# Patient Record
Sex: Female | Born: 1935
Health system: Southern US, Community
[De-identification: ages and names within clinical notes are randomized; demographics above are authoritative.]

## PROBLEM LIST (undated history)

## (undated) DIAGNOSIS — M1990 Unspecified osteoarthritis, unspecified site: Secondary | ICD-10-CM

## (undated) DIAGNOSIS — I272 Pulmonary hypertension, unspecified: Secondary | ICD-10-CM

## (undated) DIAGNOSIS — I2510 Atherosclerotic heart disease of native coronary artery without angina pectoris: Secondary | ICD-10-CM

## (undated) DIAGNOSIS — E785 Hyperlipidemia, unspecified: Secondary | ICD-10-CM

## (undated) DIAGNOSIS — Z7901 Long term (current) use of anticoagulants: Secondary | ICD-10-CM

## (undated) DIAGNOSIS — I4821 Permanent atrial fibrillation: Secondary | ICD-10-CM

## (undated) DIAGNOSIS — F32A Depression, unspecified: Secondary | ICD-10-CM

## (undated) DIAGNOSIS — I10 Essential (primary) hypertension: Secondary | ICD-10-CM

## (undated) DIAGNOSIS — C4490 Unspecified malignant neoplasm of skin, unspecified: Secondary | ICD-10-CM

## (undated) DIAGNOSIS — Z955 Presence of coronary angioplasty implant and graft: Secondary | ICD-10-CM

## (undated) DIAGNOSIS — I071 Rheumatic tricuspid insufficiency: Secondary | ICD-10-CM

## (undated) DIAGNOSIS — J449 Chronic obstructive pulmonary disease, unspecified: Secondary | ICD-10-CM

## (undated) DIAGNOSIS — F329 Major depressive disorder, single episode, unspecified: Secondary | ICD-10-CM

## (undated) DIAGNOSIS — I5032 Chronic diastolic (congestive) heart failure: Secondary | ICD-10-CM

## (undated) HISTORY — DX: Major depressive disorder, single episode, unspecified: F32.9

## (undated) HISTORY — DX: Hyperlipidemia, unspecified: E78.5

## (undated) HISTORY — DX: Depression, unspecified: F32.A

## (undated) HISTORY — PX: OTHER SURGICAL HISTORY: SHX169

## (undated) HISTORY — DX: Unspecified osteoarthritis, unspecified site: M19.90

## (undated) HISTORY — PX: HIP SURGERY: SHX245

## (undated) HISTORY — PX: ABDOMINAL HYSTERECTOMY: SHX81

---

## 1998-09-12 ENCOUNTER — Other Ambulatory Visit: Admission: RE | Admit: 1998-09-12 | Discharge: 1998-09-12 | Payer: Self-pay | Admitting: Family Medicine

## 1999-08-21 ENCOUNTER — Ambulatory Visit (HOSPITAL_BASED_OUTPATIENT_CLINIC_OR_DEPARTMENT_OTHER): Admission: RE | Admit: 1999-08-21 | Discharge: 1999-08-21 | Payer: Self-pay | Admitting: *Deleted

## 1999-08-21 ENCOUNTER — Encounter (INDEPENDENT_AMBULATORY_CARE_PROVIDER_SITE_OTHER): Payer: Self-pay | Admitting: *Deleted

## 1999-08-21 ENCOUNTER — Encounter: Payer: Self-pay | Admitting: *Deleted

## 1999-09-18 ENCOUNTER — Encounter: Payer: Self-pay | Admitting: Family Medicine

## 1999-09-18 ENCOUNTER — Encounter: Admission: RE | Admit: 1999-09-18 | Discharge: 1999-09-18 | Payer: Self-pay | Admitting: Family Medicine

## 2000-06-12 ENCOUNTER — Encounter: Payer: Self-pay | Admitting: Orthopedic Surgery

## 2000-06-19 ENCOUNTER — Inpatient Hospital Stay (HOSPITAL_COMMUNITY): Admission: RE | Admit: 2000-06-19 | Discharge: 2000-06-25 | Payer: Self-pay | Admitting: Orthopedic Surgery

## 2000-06-19 ENCOUNTER — Encounter: Payer: Self-pay | Admitting: Orthopedic Surgery

## 2005-02-21 ENCOUNTER — Ambulatory Visit: Payer: Self-pay | Admitting: Family Medicine

## 2006-02-05 ENCOUNTER — Inpatient Hospital Stay (HOSPITAL_COMMUNITY): Admission: EM | Admit: 2006-02-05 | Discharge: 2006-02-07 | Payer: Self-pay | Admitting: Emergency Medicine

## 2007-05-27 HISTORY — PX: CORONARY ANGIOPLASTY WITH STENT PLACEMENT: SHX49

## 2007-06-01 ENCOUNTER — Encounter: Admission: RE | Admit: 2007-06-01 | Discharge: 2007-06-01 | Payer: Self-pay | Admitting: Cardiology

## 2007-06-08 ENCOUNTER — Inpatient Hospital Stay (HOSPITAL_COMMUNITY): Admission: RE | Admit: 2007-06-08 | Discharge: 2007-06-10 | Payer: Self-pay | Admitting: Cardiology

## 2008-05-26 DIAGNOSIS — I2510 Atherosclerotic heart disease of native coronary artery without angina pectoris: Secondary | ICD-10-CM

## 2008-05-26 HISTORY — DX: Atherosclerotic heart disease of native coronary artery without angina pectoris: I25.10

## 2009-03-09 ENCOUNTER — Encounter: Admission: RE | Admit: 2009-03-09 | Discharge: 2009-03-09 | Payer: Self-pay | Admitting: Cardiovascular Disease

## 2009-03-14 ENCOUNTER — Ambulatory Visit (HOSPITAL_COMMUNITY): Admission: RE | Admit: 2009-03-14 | Discharge: 2009-03-14 | Payer: Self-pay | Admitting: Cardiovascular Disease

## 2009-03-22 HISTORY — PX: CARDIAC CATHETERIZATION: SHX172

## 2009-06-05 ENCOUNTER — Ambulatory Visit (HOSPITAL_COMMUNITY): Admission: RE | Admit: 2009-06-05 | Discharge: 2009-06-05 | Payer: Self-pay | Admitting: Cardiology

## 2009-06-05 HISTORY — PX: CARDIOVERSION: SHX1299

## 2010-01-23 ENCOUNTER — Encounter: Admission: RE | Admit: 2010-01-23 | Discharge: 2010-01-23 | Payer: Self-pay | Admitting: Cardiology

## 2010-08-11 LAB — BASIC METABOLIC PANEL
CO2: 26 mEq/L (ref 19–32)
GFR calc non Af Amer: >60 mL/min (ref >60)
Glucose, Bld: 97 mg/dL (ref 70–99)

## 2010-08-11 LAB — CBC
HCT: 36.3 % (ref 36.0–46.0)
RDW: 13.4 % (ref 11.5–15.5)
WBC: 8.3 10*3/uL (ref 4.0–10.5)

## 2010-08-11 LAB — APTT: aPTT: 55 seconds — ABNORMAL HIGH (ref 24–37)

## 2010-08-11 LAB — TSH: TSH: 0.796 u[IU]/mL (ref 0.350–4.500)

## 2010-10-08 NOTE — Cardiovascular Report (Signed)
NAMEJADIS, Tiffany Charles NO.:  [id number]   MEDICAL RECORD NO.:  [id number]          PATIENT TYPE:  OIB   LOCATION:  2857                         FACILITY:  [facility]   PHYSICIAN:  Cristy Hilts. Jacinto Halim, MD       DATE OF BIRTH:  02-21-1930   DATE OF PROCEDURE:  06/08/2007  DATE OF DISCHARGE:                            CARDIAC CATHETERIZATION   REFERRING PHYSICIAN:  Aida Puffer, MD.   PROCEDURES PERFORMED.:  1. Left ventriculography.  2. Selective left coronary angiography.  3. Percutaneous transluminal cardiac angioplasty and stenting of the      distal right coronary artery.  4. Right femoral arteriography and closure of right femoral arterial      access with StarClose.   INDICATIONS:  Tiffany Charles is a 77 year old active female with history  of hypertension, hyperlipidemia who has known coronary artery disease.  She had undergone diagnostic cardiac catheterization at [facility] for angina pectoris on March 12, 2007, and this had revealed 8-  80% stenosis of the distal right coronary artery, diffuse disease of the  right proximal and mid right coronary artery, and a distal circumflex  90% stenosis.  She had collaterals from the right coronary artery to the  distal circumflex coronary artery.  She had moderate disease in the LAD.  She was aggressively medically treated. In spite of aggressive medical  therapy, she continued to have recurrent angina pectoris.  Hence, she is  brought back to the cardiac catheterization lab to reevaluate her  coronary anatomy with an eye towards percutaneous revascularization.   HEMODYNAMIC DATA.:  Left ventricle:  Left ventricle pressure was 154/4  with end-diastolic pressure of 16 mmHg.  Aortic pressure 152/16 with a  mean of 99 mmHg.  No pressure gradient across the aortic valve.   ANGIOGRAPHIC DATA:  Left ventricle:  The left ventricular systolic  function was normal with ejection fraction of 60%.  There was no  significant wall motion abnormality.  There was no significant mitral  regurgitation.   Right coronary artery: The right coronary artery is a large vessel and  dominant vessel.  It has mild to moderate diffuse luminal irregularities  in the proximal and mid segment constituting 40-50% stenosis.  Distal  right coronary artery had an 80% stenosis.  It gave origin to two PDA  branches.  The proximal PDA branch has an ostial 70% stenosis unchanged  from prior cardiac catheterization.  PLA branch is large.  This is  smooth and normal.   Left main coronary artery:  The left main coronary is a large vessel.  It has a proximal 10-15% stenosis which is calcified.   Circumflex artery:  The circumflex artery is a moderate-caliber vessel.  Gives origin to a large obtuse marginal-1.  The distal circumflex after  the origin of the obtuse marginal-1 had a focal 90% stenosis.  However,  this vessel was approximately about 2.0 mm in diameter.  This distal  circumflex received collaterals from the right coronary artery.   LAD:  The LAD is a large vessel.  It gives origin to several small  diagonals.  It has a 40% mid stenosis.  Otherwise, it has mild diffuse  luminal irregularity.   INTERVENTION DATA:  Successful PTCA and direct stenting of the distal  right coronary artery with implantation of a 3.5 x 23 mm Vision stent.  This stent was deployed at 16 atmospheres pressure giving a 3.9 mm  diameter.  Post stent implantation angiography revealed excellent  results.   RECOMMENDATIONS:  The patient will be continued on aggressive risk  modification.  Medical therapy is only indicated for distal circumflex  stenosis as it has collaterals from the right coronary artery.  Overall,  the stenosis has been reduced from 80% to 0%.   The patient will be discharged home in the morning.  A total of 135-145  mL of contrast was utilized for diagnostic and interventional procedure.   TECHNIQUE OF PROCEDURE:   Under usual sterile precautions using a 6-  French right femoral arterial access, a 6-French multipurpose B2  catheter was advanced into the ascending aorta and then the left  ventricle.  Left ventriculography was performed both in LAO and RAO  projections.  Then the catheter was pulled into the ascending aorta.  Right coronary artery selectively engaged.  Angiography was performed.  Then the left main coronary artery was selectively engaged, and  angiography was performed.  Then the catheter was pulled out of body.   A 6-French sheath was exchanged to a 7-French sheath.  A 7-French FR-4  guide with side-hole was advanced into the ascending aorta, and right  coronary artery was selectively engaged.  Using Angiomax for  anticoagulation, 190 cm x 0.014 inch  ATW marker guidewire was advanced  in the right coronary artery, and the lesion length was carefully  measured.  Intracoronary nitroglycerin was also administered.  Then a  3.5 x 23 mm Vision stent was advanced into the lesion site and the stent  deployed at 16 atmospheres pressure for 1 minute, and angiography was  repeated.  Intracoronary nitroglycerin was also administered.  Excellent  results were noted.  Guidewire was withdrawn. Angiography repeated.  The  guide catheter disengaged from right coronary artery and pulled out of  the body.  The patient tolerated the procedure well.  Right femoral  angiography was performed through the arterial access sheath, and access  closed with StarClose with excellent hemostasis.  No immediate  complication noted.      Cristy Hilts. Jacinto Halim, MD  Electronically Signed     JRG/MEDQ  D:  06/08/2007  T:  06/08/2007  Job:  [id number]   cc:   Aida Puffer

## 2010-10-08 NOTE — Discharge Summary (Signed)
Tiffany Charles, Tiffany Charles NO.:  [id number]   MEDICAL RECORD NO.:  [id number]          PATIENT TYPE:  INP   LOCATION:  6527                         FACILITY:  [facility]   PHYSICIAN:  Cristy Hilts. Jacinto Halim, MD       DATE OF BIRTH:  1930-03-17   DATE OF ADMISSION:  06/08/2007  DATE OF DISCHARGE:                               DISCHARGE SUMMARY   DISCHARGE DIAGNOSES:  1. Dyspnea on exertion and angina resolved.  2. Coronary artery disease, previously nonobstructive disease.      a.     Distal right coronary artery with 80% stenosis, underwent a       Multilink Vision stent by Dr. Jacinto Halim.  3. Normal ejection fraction at 60%.  4. Sinus bradycardia with heart rates in the 30s.      a.     Decrease in beta blocker dose.  5. Hypertension, stable and controlled.  6. Hyperlipidemia, controlled.  7. The patient has stopped tobacco one month ago.  8. Pulmonary hypertension.   DISCHARGE MEDICATIONS:  1. Amlodipine 10 mg daily.  2. Protonix 40 mg daily.  3. Lisinopril 10 mg daily.  4. Aspirin 325 mg daily.  5. Pravastatin 40 mg at  bedtime.  6. Metoprolol tartrate 25-mg tablet, 1/2 tablet twice daily or 12.5 mg      twice per day.  7. Plavix 75 mg daily. Do not stop.  8. Nitroglycerin as needed for chest pain, sublingual.   HISTORY OF THE PRESENT ILLNESS:  A 77 year old female patient of Dr.  Verl Dicker who presented to the office with dyspnea on exertion, chest  pressure.  She had a history of nonobstructive disease with a cath in  October of 2008 with 70% stenosis in the mid to distal RCA and 40% mid  LAD stenosis, but she was increasing anginal symptoms, so Dr. Jacinto Halim felt  prudent treatment would be a cardiac catheterization which she agreed  to.  She was brought in on 06/08/2007 and underwent cardiac  catheterization.  She was found to have an 80% distal RCA stenosis. She  does have collaterals from the RCA to the circumflex and does have a  stenosis in the circ that the RCA feeds  which would explain her angina.   She underwent MultiVision non-DES stent to the RCA and tolerated the  procedure well.  She was StarClosed.  The next morning her troponins  were elevated at 0.20 and then 0.29.  CK-MBs were all negative.  Also,  her heart rate was found to be in the 30s frequently and in the 40s.  She had complained of fatigue so we held her Lopressor the morning of  06/08/2007 and restarted her at 12.5 mg twice per day.  She was able to  ambulate with cardiac rehabilitation without problems, but we did keep  her one extra day secondary to elevated troponins and bradycardia.  By  06/10/2007 she was stable, labs were good and she was ready for  discharge home.   PHYSICAL EXAMINATION ON DISCHARGE:  VITAL SIGNS:  Blood pressure was  136/55,  pulse rate was  58 to 48, respirations were 18, saturations were  96% in room air, temperature was 97.7.  HEART:  S1, S2 regular rate and rhythm.  No no murmurs, gallops or rubs.  LUNGS:  Clear.  ABDOMEN:  Soft and nontender.  Positive bowel sounds.  EXTREMITIES:  Without edema.   LABORATORY DATA:  Hemoglobin was 12.1, hematocrit was 36.4, WBC was 9.2,  platelets were 165, MCV 96.7.  Chemistry:  Sodium 138, potassium was  3.9, chloride 104, CO2 was 27, BUN 13, creatinine 0.93, glucose was 104.  Troponin post procedure was 0.20 and then 0.29, then 0.22 and then down  to 0.17.  CK-MBs were all negative. Was 58, 67, MB is 2.1-4.2.  Electrolytes: Other calcium is 8.6.   Chest x-ray had been done as an outpatient and was reviewed.   EKG post procedure: Sinus bradycardia, heart rate was 50, otherwise no  changes, occasional PAC.   The hospital course was previously described.  She was seen by Dr.  Elsie Lincoln on 06/10/2007 and was felt ready for discharge.   She will follow up as an outpatient with Dr. Jacinto Halim.      Darcella Gasman. [place], N.P.      Cristy Hilts. Jacinto Halim, MD  Electronically Signed    LRI/MEDQ  D:  06/10/2007  T:  06/10/2007   Job:  [id number]   cc:   Cristy Hilts. Jacinto Halim, MD  Aida Puffer

## 2010-10-11 NOTE — Op Note (Signed)
[place]  Patient:    Tiffany Charles, Tiffany Charles                       MRN: [id number] Proc. Date: 06/19/00 Adm. Date:  [id number] Attending:  Skip Mayer                           Operative Report  PREOPERATIVE DIAGNOSIS:  Severe degenerative arthritis of the right hip with a 1/4 inch leg length discrepancy with the right leg being 1/4 inch shorter than the left.  POSTOPERATIVE DIAGNOSIS:  Severe degenerative arthritis of the right hip with a 1/4 inch leg length discrepancy with the right leg being 1/4 inch shorter than the left.  OPERATION:  Right total hip arthroplasty utilizing the [employer] Secure Fit System porous coated.  The sizes used was a size 50 mm microstructured PSL cup with two screws.  The insert was a 10 degree polyethylene liner.  We utilized a +0  C tapered head and the femoral stem was a Secure Fit Plus size 9.  SURGEON:  Georges Lynch. Gioffre, M.D.  ASSISTANT:  Philips J. Montez Morita, M.D.  DESCRIPTION OF PROCEDURE:  Under general anesthesia a routine orthopedic prep and draping of the right hip was carried out.  At this time, the patient had 1 gram of IV Ancef.  Incision was made over the posterior lateral aspect of the right hip in the usual fashion. Bleeders were identified and cauterized. Self retaining retractors were inserted.  The incision was carried down to the iliotibial band.  The Charnley retractor was inserted.  I then detached the external rotators and partly detached the gluteus medius.  At this particular time, we then went down and totally detached the external rotators and did a capsulectomy.  Great care was taken not to injure the underlying sciatic nerve.  We then dislocated the femoral head, amputated the head at the appropriate neck length.  We then reamed and rasped the femoral shaft up to a size 9.  I brought the C arm in to make sure that we did not penetrate the femoral canal and we did not.  At this  time, we reamed our distal tip to a 13.5 mm size.  After the femur was prepared, we then utilized the Gold rasp in order to further rasp the femoral shaft.   After this, we then completed our capsulectomy.  We reamed our cup up to a size 50 mm cup.  We inserted our trial cup in and went through a range of motion with all components in place and we felt that a +0 was our most stable length of the C tapered head.  We then removed the trials and thoroughly irrigated out the cup and inserted our permanent PSL cup size 50 mm in diameter.  Then we went through trials again before the screws were inserted.  We had an excellent position of the cup.  We then inserted two cancellus bone screws to affix the cup to the acetabulum. We then inserted our 10 degree polyethylene liner.  We then went through trials once again with the +0 and the -3 C tapered head and felt that the +0 was the most stable.  We then irrigated out the femoral shaft and inserted our permanent size 9 Secure Fit femoral prosthesis.  We then went through and inserted our permanent +0 C tapered head.  We reduced the hip and  had excellent function and the hip was definitely stable.  Thoroughly irrigated out the area and closed the wound in layered usual fashion.  Skin was closed with metal staples.  A sterile Neosporin dressing was applied.  The patient was placed in the knee immobilizer postoperative. DD:  06/19/00 TD:  06/20/00 Job: [id number]

## 2010-10-11 NOTE — Discharge Summary (Signed)
NAMEDAVITA, SUBLETT NO.:  [id number]   MEDICAL RECORD NO.:  [id number]          PATIENT TYPE:  INP   LOCATION:  1514                         FACILITY:  [facility]   PHYSICIAN:  Almedia Balls. Ranell Patrick, M.D. DATE OF BIRTH:  04/22/1930   DATE OF ADMISSION:  02/05/2006  DATE OF DISCHARGE:  02/07/2006                                 DISCHARGE SUMMARY   ADMISSION DIAGNOSES:  1. Right hip pain.  2. History of right total hip arthroplasty.  3. History of gastroesophageal reflux disease.   DISCHARGE DIAGNOSES:  1. Right hip pain, improved.  2. History of right total hip arthroplasty.  3. History of gastroesophageal reflux disease.   BRIEF HISTORY:  The patient is a 77 year old female who woke up with right  hip and leg pain.  It was a severe pain radiating down the right side of her  leg.  The patient states she has been having some increasing pain with  movement and was unable to walk or ambulate.  She was brought in by  ambulance to the emergency department and admitted for pain control and the  questionable etiology of this right hip pain.   HOSPITAL COURSE:  The patient was admitted on February 05, 2006 from the  emergency department.  On initial exam, the patient was able to move her  right hip without very much difficulty, stating that most of her pain seemed  to be also in her back radiating down to the right hip and right groin, but  she did not have pain with any type of internal or external rotation.  She  did have a mildly elevated white blood count of 15.6 on initial CBC, but she  was afebrile.  We did discuss this case with Dr. Darrelyn Hillock, who was the  primary surgeon for her right total hip arthroplasty, and before trying any  type of aspiration, we did get an MRI of her low back, which was negative.  On hospital day 2, the patient was feeling much better in regard to her  right hip pain.  It had basically decreased a tremendous amount.  Thus, we  were not  inclined to go through with the hip aspiration.  Her sed rate and C-  reactive protein both were not elevated, which helped confirm that an  aspiration would not be appropriate at this time.  We just continued  watching her progress.  She was able to work with physical therapy and  occupational therapy for walking with a walker and was going to be  discharged home with the same.  The patient was discharged home on hospital  day 3 after her pain had decreased tremendously and she was able to use a  rolling walker without very much difficulty.  The patient was discharged  home in stable condition on February 07, 2006.   CONDITION ON DISCHARGE:  Stable.   DIET:  Regular.   ALLERGIES:  No known drug allergies.   DISCHARGE MEDICATIONS:  1. Vicodin 5/500, 1-2 tablets q.4 h. for pain.  2. Robaxin 500 mg p.o. q.6 h. p.r.n. spasm.  FOLLOWUP:  The patient is to follow up with Dr. Darrelyn Hillock in 2 weeks for  further management.      Thomas B. Dixon, P.A.    ______________________________  Almedia Balls. Ranell Patrick, M.D.    TBD/MEDQ  D:  02/27/2006  T:  02/28/2006  Job:  [id number]

## 2010-10-11 NOTE — H&P (Signed)
[facility]  Patient:    Tiffany Charles, Tiffany Charles                      MRN: [id number] Adm. Date:  06/19/00 Attending:  Georges Lynch. Darrelyn Hillock, M.D. Dictator:   Alexzandrew L. Perkins, P.A.-C.                         History and Physical  CHIEF COMPLAINT:  Right hip pain.  HISTORY OF PRESENT ILLNESS:  The patient is a 77 year old female who has been seen and evaluated for right hip pain.  She has been seen and evaluated by Georges Lynch. Darrelyn Hillock, M.D.  She comes in with a history of right hip pain after periods of prolonged standing or sitting.  The pain is mostly in the groin. It is somewhat positional.  She is seen in the office and has very limited range of motion in the right hip and also painful passive range of motion. She has about a 1/4 inch length discrepancy shortening on the right leg as compared to the left.  X-rays taken in the office show significant degenerative arthritis in the right hip.  She is already at a stage where she would benefit from undergoing a total hip replacement arthroplasty.  The risks and benefits of this procedure have been discussed with the patient and she has elected to proceed with surgery.  ALLERGIES:  No known drug allergies.  INTOLERANCE:  CODEINE causes GI upset.  MEDICATIONS:  Vioxx 25 mg daily.  Stopped prior to surgery.  PAST MEDICAL HISTORY:  Mild hypercholesterolemia, reflux, and history of ankle fracture.  PAST SURGICAL HISTORY:  Breast biopsy in March of 2001 and also a partial hysterectomy.  MEDICAL DOCTOR:  Arta Silence, M.D.  SOCIAL HISTORY:  The patient is married and has two children.  A positive smoker for approximately 45 years.  She is down to a few cigarettes a day. Denies the use of alcohol products.  She lives in a [place] home with only two steps going in.  FAMILY HISTORY:  Mother deceased at age 7 with a history of stroke.  Father decreased at age 59 with coronary artery disease and  questionable heart disease.  REVIEW OF SYSTEMS:  General:  No fever, chills, or night sweats.  Neurologic: No seizures, syncope, or paralysis.  Respiratory:  No shortness of breath, productive cough, or hemoptysis.  Cardiovascular:  No chest pain, angina, or orthopnea.  GI:  The patient has some occasional loose stools that she attributes to her Vioxx.  No diarrhea or constipation.  No nausea or vomiting. No blood or mucus in the stool.  GU:  No dysuria, hematuria, or discharge. Musculoskeletal:  Pertinent for the right hip as found in the history of present illness.  PHYSICAL EXAMINATION:  VITAL SIGNS:  Pulse 64, respirations 12, blood pressure 118/66.  GENERAL APPEARANCE:  The patient is a 77 year old white female, well nourished, well developed, and appears to be in no acute distress.  She is alert, oriented, and cooperative.  HEENT:  Normocephalic and atraumatic.  Pupils round and reactive EOMs are intact.  The patient does have upper dentures.  NECK:  Supple.  No carotid bruits.  CHEST:  Clear to auscultation and percussion.  No rhonchi or rales.  HEART:  Split S1.  Normal S2.  No rubs, thrills, or palpitations.  No murmurs.  ABDOMEN:  Soft and nontender.  Bowel sounds are present.  No rebound  or guarding.  RECTAL:  Not done, not pertinent to present illness.  BREASTS:  Not done, not pertinent to present illness.  GENITALIA:  Not done; not pertinent to present illness.  EXTREMITIES:  Significant to the right lower extremity.  The patient has decreased range of motion, passive range of motion of the right hip.  It is painful motion.  Motor function is intact.  Movement of the leg, foot, and toes well.  Sensation is intact throughout the right lower extremity.  There is a 1/4 inch leg length discrepancy on the right as compared to the left.  X-RAYS:  Severe degenerative arthritis, right hip.  IMPRESSION: 1. Osteoarthritis, right hip. 2. Gastroesophageal reflux  disease. 3. Mild hypercholesterolemia.  PLAN:  The patient will be admitted to [facility] to undergo a right total hip replacement arthroplasty.  The surgery is to be performed by Windy Fast A. Darrelyn Hillock, M.D.  The patients medical doctor is Arta Silence, M.D. Dr. Hetty Ely will be notified of the room number on admission and will be consulted as needed for any assistance with this patient throughout the hospital course. DD:  06/15/00 TD:  06/15/00 Job: [id number]

## 2010-10-11 NOTE — Discharge Summary (Signed)
[facility]  Patient:    Tiffany Charles, Tiffany Charles                       MRN: [id number] Adm. Date:  [id number] Disc. Date: [id number] Attending:  Skip Mayer Dictator:   Dorie Rank, P.A.                           Discharge Summary  ADMITTING DIAGNOSES:  1. Osteoarthritis to the right hip.  2. History of ankle fracture.  3. Gastroesophageal reflux disease.  DISCHARGE DIAGNOSES:  1. Osteoarthritis to the right hip.  2. History of ankle fracture.  3. Gastroesophageal reflux disease.  4. Postoperative hemorrhagic anemia--stable.  OPERATION PERFORMED:  On June 19, 2000, the patient underwent a right total hip arthroplasty. Anesthesia general. Surgeon Dr. Ranee Gosselin, assistant, Ronnell Guadalajara, M.D.  COMPLICATIONS:  None.  CONSULTATIONS:  None.  BRIEF HISTORY:  A 77 year old white female, who has a history of right hip pain after periods of prolonged sitting or standing. The pain was mostly from the groin area somewhat. At the office, it was noted that she had very limited range of motion to the right hip and also painful passive range of motion. She had about a 1/4 inch length discrepancy shortening on the right leg as compared to the left. X-rays taken in the office showed significant degenerative arthritis in the right hip. It was felt then due to her symptoms x-rays and her decrease in her activities of daily living, she would benefit from undergoing a right total knee arthroplasty. The risks and benefits were discussed with the patient and at that point in the office, she elected to proceed with the procedure. She was cleared for surgery by her family nurse practitioner, Arizona Constable.  HOSPITAL COURSE:  On June 19, 2000, the patient underwent the above stated procedure without complications. Postoperatively for pain, she utilized a PCA and morphine pump for pain control. By postoperative day #2, she was weaned off of PCA and was  utilizing p.o. Percocet for pain on the date of discharge. While in surgery, the wound was dressed in a sterile fashion. On postoperative day #1, the wound was monitored for drainage through the bandage and it was noted that there was none. On postoperative day #2, the dressing was changed and found to be free of erythema or any exudate. The wound was changed daily throughout her hospital stay and monitored for erythema or drainage. On postoperative day #1, the Hemovac drain that was instilled while in the OR was pulled without difficulty. A Foley catheter was placed while in the operating room. By postoperative day #2, this was pulled and the patient voided well throughout the rest of her hospital stay. Postoperatively, the patient was placed on Coumadin that was monitored by the pharmacy for DVT prophylaxis. The pharmacy monitored and dosed her Coumadin throughout her hospital stay. Hemoglobin and hematocrit were monitored daily x 3 days. Her hemoglobin did go down to 8.8 on June 21, 2000. At this point, she was exhibiting some symptoms of orthostatic hypertension while standing up. She was transfused on June 21, 2000 and on June 22, 2000 her hemoglobin was back up to 11.5 post transfusion. The patient postoperatively utilized an IV of lactated Ringers with 100 cc per hour and this was KVOd on postoperative day #2 when she was found to be taking p.o. fluids well. One gram of Ancef IV was utilized for  three days, q. 8h for infection prophylaxis. The patient wore a knee immobilizer throughout her hospital day for prevention of possible dislocation. The patient underwent physical therapy while in the hospital per right total hip protocol. She progressed well with her therapy. She was slowed down on January 27 due to the fact that she was orthostatic and was receiving blood transfusions. However, after that she progressed well and by the day of discharge she was ambulating 120 feet.  Occupational therapy was consulted to assist with activities of daily living. The patient was placed on a touchdown weightbearing status to the right lower extremity, she did maintain this throughout her hospital stay. On June 25, 2000, the patient was stable and progressing well and it was felt that she could be discharged home. Case management worked with the patient to set up home health care which would provide her with physical therapy and monitor her Coumadin and INRs as well.  LABORATORY DATA:  Hemoglobin and hematocrit on June 12, 2000 was 13.0 and 39.0 respectively. On June 21, 2000, hemoglobin was 8.9, hematocrit 26.1. On June 22, 2000, hemoglobin post transfusion was 11.9, hematocrit was 33.1. PT and INR on June 12, 2000 was 12.0 and 0.9. PT and INR on January ______, 2002 were 18.6 and 1.9. On the day of discharge, June 25, 2000, PT was 22.1, INR was 2.6. BMET was normal on June 12, 2000. Liver functions on June 12, 2000 revealed ALT 18, ALP 63, TBIL 0.5. Urinalysis on June 12, 2000 was negative.  X-rays on June 19, 2000 one view pelvis and one view of the right hip revealed satisfactory position of the right total hip replacement. Preop chest x-ray on June 12, 2000 showed the cardia mediastinal silhouette to be unremarkable for her age. Mild elevation of the left hemidiaphragm present without obvious intrathoracic cause. The lungs were clear. Mild degenerative changes present to the lower thoracic spine. Right hip with MAG marker showed severe osteoarthritis of the right hip. EKG on June 05, 2000 showed sinus bradycardia that was faxed there from [place] at [facility] and was unconfirmed.  CONDITION ON DISCHARGE:  Improved.  DISCHARGE PLANS:  1. The patient was discharged home, she was to have home health PT,     home health RN, and the pharmacy through [facility] was to monitor her     PT and INR and dose her Coumadin.  2. Regular  diet.  3. She was given prescriptions for Percocet and Robaxin. She was to     follow-up with Dr. Darrelyn Hillock in 14 days from the date of her surgery.  4. Dressing changes as needed.   5. She was placed on touchdown weightbearing status to the right lower     extremity.  6. She was to take Tylenol for fever. She was to call our office if it was     over 101.  7. She was to continue her home medications.  8. She could shower as long as her wound was not draining. DD:  07/17/00 TD:  07/20/00 Job: [id number]

## 2010-10-11 NOTE — Op Note (Signed)
[facility]  Patient:    Tiffany Charles, Tiffany Charles                       MRN: [id number] Proc. Date: 08/21/99 Adm. Date:  [id number] Attending:  Kandis Mannan CC:         Dr. Mikey College. Samuella Cota, M.D.                           Operative Report  CCS# [id number]  PREOPERATIVE DIAGNOSIS:  Abnormal mammogram left breast.  POSTOPERATIVE DIAGNOSIS:  Abnormal mammogram left breast.  OPERATION PERFORMED:  Needle localization biopsy of left breast.  SURGEON:  Maisie Fus B. Samuella Cota, M.D.  ANESTHESIA:  1% Xylocaine local with anesthesia monitoring, anesthesiologist and CRNA Meta Hatchet.  DESCRIPTION OF PROCEDURE:  The patient was taken to the operating room and placed on the table in supine position.  The right breast was prepped and draped in a sterile field.  The patient had a wire coming through the skin high in the left  breast at about the 1 oclock position.  A transverse elliptical incision was outlined with a skin marker.  1% Xylocaine local was then used to infiltrate the skin and underlying breast tissue.  An ellipse of skin was excised so as to remove the opening in the skin made by the wire.  Using the wire as a guide, the breast tissue was taken down to the chest wall.  Bleeding was controlled with the cautery. The specimen was marked laterally with a long suture and medially with a short suture.  The specimen was given to the radiologist who x-rayed the specimen and  said the area of concern had been removed.  Hemostasis was obtained and the subcuticular tissue was approximated with 3-0 Vicryl and the skin was closed with a running subcuticular suture of 5-0 Vicryl.  Benzoin and half-inch Steri-Strips ere used to reinforce the skin closure.  Pressure dressing using 4 x 4s, ABD, and 4  inch Hypafix was applied.  The patient seemed to tolerate the procedure well and was taken to the PACU in satisfactory condition. DD:   08/21/99 TD:  08/22/99 Job: [id number]

## 2011-02-13 LAB — BASIC METABOLIC PANEL
CO2: 28
Creatinine, Ser: 0.86
GFR calc Af Amer: >60
GFR calc non Af Amer: >60
Glucose, Bld: 82
Potassium: 4.1

## 2011-02-13 LAB — CBC
Hemoglobin: 12.1
MCHC: 34
MCV: 97
WBC: 8.6

## 2011-02-13 LAB — TROPONIN I
Troponin I: 0.2 — ABNORMAL HIGH
Troponin I: 0.29 — ABNORMAL HIGH

## 2011-02-13 LAB — CK TOTAL AND CKMB (NOT AT ARMC)
CK, MB: 4.2 — ABNORMAL HIGH
Relative Index: INVALID
Total CK: 67

## 2011-02-14 LAB — BASIC METABOLIC PANEL
CO2: 27
Creatinine, Ser: 0.93
GFR calc Af Amer: >60
Potassium: 3.9

## 2011-02-14 LAB — CBC
HCT: 36.4
Hemoglobin: 12.6
RBC: 3.77 — ABNORMAL LOW

## 2011-02-14 LAB — CK TOTAL AND CKMB (NOT AT ARMC)
CK, MB: 2.1
Total CK: 58

## 2011-03-20 ENCOUNTER — Emergency Department (HOSPITAL_COMMUNITY): Payer: No Typology Code available for payment source

## 2011-03-20 ENCOUNTER — Emergency Department (HOSPITAL_COMMUNITY)
Admission: EM | Admit: 2011-03-20 | Discharge: 2011-03-20 | Disposition: A | Discharge status: Home / Self Care | Payer: No Typology Code available for payment source | Attending: Emergency Medicine | Admitting: Emergency Medicine

## 2011-03-20 DIAGNOSIS — M79609 Pain in unspecified limb: Secondary | ICD-10-CM | POA: Insufficient documentation

## 2011-03-20 DIAGNOSIS — M7989 Other specified soft tissue disorders: Secondary | ICD-10-CM | POA: Insufficient documentation

## 2011-03-20 DIAGNOSIS — S60229A Contusion of unspecified hand, initial encounter: Secondary | ICD-10-CM | POA: Insufficient documentation

## 2011-03-20 DIAGNOSIS — Z7901 Long term (current) use of anticoagulants: Secondary | ICD-10-CM | POA: Insufficient documentation

## 2011-03-20 LAB — PROTIME-INR: INR: 3.16 — ABNORMAL HIGH (ref 0.00–1.49)

## 2011-05-23 ENCOUNTER — Encounter (INDEPENDENT_AMBULATORY_CARE_PROVIDER_SITE_OTHER): Payer: Self-pay | Admitting: General Surgery

## 2011-05-27 DIAGNOSIS — C4490 Unspecified malignant neoplasm of skin, unspecified: Secondary | ICD-10-CM

## 2011-05-27 HISTORY — DX: Unspecified malignant neoplasm of skin, unspecified: C44.90

## 2011-05-29 ENCOUNTER — Ambulatory Visit (INDEPENDENT_AMBULATORY_CARE_PROVIDER_SITE_OTHER): Payer: PRIVATE HEALTH INSURANCE | Admitting: General Surgery

## 2011-05-29 ENCOUNTER — Encounter (INDEPENDENT_AMBULATORY_CARE_PROVIDER_SITE_OTHER): Payer: Self-pay

## 2011-05-29 ENCOUNTER — Encounter (INDEPENDENT_AMBULATORY_CARE_PROVIDER_SITE_OTHER): Payer: Self-pay | Admitting: General Surgery

## 2011-05-29 VITALS — BP 120/78 | HR 68 | Temp 98.2°F | Resp 18 | Ht 67.5 in | Wt 140.0 lb

## 2011-05-29 DIAGNOSIS — C44529 Squamous cell carcinoma of skin of other part of trunk: Secondary | ICD-10-CM

## 2011-05-29 NOTE — Patient Instructions (Signed)
You will be scheduled for a wider excision of the squamous cell cancer of the skin overlying your left clavicle. We will ask Dr. Caprice Kluver if we can stop your Coumadin 5 days prior to this surgery.

## 2011-05-29 NOTE — Progress Notes (Signed)
Patient ID: Tiffany Charles, female   DOB: July 15, 1929, 78 y.o.   MRN: [id number]  Chief Complaint  Patient presents with  . Other    squamous cell ca chest wall    HPI Tiffany Charles is a 78 y.o. female.  This patient is referred to me by Dr. Aida Puffer at [facility] for wide local excision of a squamous cell cancer of the skin overlying the left clavicle. Dr. Caprice Kluver is her cardiologist.  The patient is very pleasant patient, she is independent. She is followed for pulmonary hypertension, three-vessel coronary artery disease status post stent placement, atrial fibrillation, hyperlipidemia.  The patient states that she noticed a red bump overlying her left clavicle recently. Over the space of about 3 weeks enlarged. Dr. Aida Puffer excise this in the office recently. The sutures are still in place. The pathology report showed well differentiated squamous cell carcinoma. This involves the underlying dermis. Margins are involved. She has had no problems healing from this. She has no prior history of skin cancer. HPI  Past Medical History  Diagnosis Date  . Campath-induced atrial fibrillation   . Chronic ischemic heart disease, unspecified   . Angina pectoris   . Hyperlipidemia   . Myocardial infarction   . Arthritis     Past Surgical History  Procedure Date  . Abdominal hysterectomy   . Hip surgery     Family History  Problem Relation Age of Onset  . Stroke Mother     Social History History  Substance Use Topics  . Smoking status: Former Smoker    Quit date: 05/28/2010  . Smokeless tobacco: Not on file  . Alcohol Use: No    Allergies  Allergen Reactions  . Codeine Nausea And Vomiting    Current Outpatient Prescriptions  Medication Sig Dispense Refill  . aspirin 81 MG tablet Take 160 mg by mouth daily.        . digoxin (LANOXIN) 0.125 MG tablet Take 125 mcg by mouth daily.        Marland Kitchen diltiazem (TIAZAC) 300 MG 24 hr capsule Take 300 mg by mouth daily.         . isosorbide mononitrate (IMDUR) 60 MG 24 hr tablet Take 60 mg by mouth daily.        . pravastatin (PRAVACHOL) 80 MG tablet Take 80 mg by mouth daily.        Marland Kitchen warfarin (COUMADIN) 5 MG tablet Take 5 mg by mouth daily.          Review of Systems Review of Systems  Constitutional: Negative for fever, chills and unexpected weight change.  HENT: Negative for hearing loss, congestion, sore throat, trouble swallowing and voice change.   Eyes: Negative for visual disturbance.  Respiratory: Negative for cough and wheezing.   Cardiovascular: Negative for chest pain, palpitations and leg swelling.  Gastrointestinal: Negative for nausea, vomiting, abdominal pain, diarrhea, constipation, blood in stool, abdominal distention and anal bleeding.  Genitourinary: Negative for hematuria, vaginal bleeding and difficulty urinating.  Musculoskeletal: Negative for arthralgias.  Skin: Negative for rash and wound.  Neurological: Negative for seizures, syncope and headaches.  Hematological: Negative for adenopathy. Does not bruise/bleed easily.  Psychiatric/Behavioral: Negative for confusion.    Blood pressure 120/78, pulse 68, temperature 98.2 F (36.8 C), resp. rate 18, height 5' 7.5" (1.715 m), weight 140 lb (63.504 kg).  Physical Exam Physical Exam  Constitutional: She is oriented to person, place, and time. She appears well-developed and well-nourished. No  distress.  HENT:  Head: Normocephalic and atraumatic.  Mouth/Throat: No oropharyngeal exudate.  Eyes: Conjunctivae and EOM are normal. Pupils are equal, round, and reactive to light. Right eye exhibits no discharge. Left eye exhibits no discharge. No scleral icterus.  Neck: Normal range of motion. Neck supple. No thyromegaly present.  Cardiovascular: Normal rate and normal heart sounds.   No murmur heard.      Irreg. irreg.  Pulmonary/Chest: Effort normal and breath sounds normal. No respiratory distress. She has no wheezes. She has no rales.  She exhibits no tenderness.       No axillary adenopathy  Musculoskeletal: Normal range of motion. She exhibits no edema and no tenderness.  Lymphadenopathy:    She has no cervical adenopathy.  Neurological: She is alert and oriented to person, place, and time. She exhibits normal muscle tone. Coordination normal.  Skin: Skin is warm and dry. No rash noted. She is not diaphoretic. No erythema. No pallor.     Psychiatric: She has a normal mood and affect. Her behavior is normal. Judgment and thought content normal.    Data Reviewed I reviewed Dr. Fayrene Fearing Little's office note and the pathology report Assessment    Well differentiated squamous cell carcinoma of the skin overlying the left clavicle. Status post recent excision with positive margins.  Chronic atrial fibrillation on Coumadin  Ischemic heart disease, status post stent placement  Hyperlipidemia  Status post right total hip replacement  Status post vaginal hysterectomy    Plan    Schedule for a wide local excision of the left clavicular skin cancer site. I think we can do this is simply by making a bigger ellipse along the same axis as the original incision, taking dissection down to the muscle fascia and get a primary closure.  We will ask Dr. Caprice Kluver if we can discontinue the Coumadin 5 days preop  We will do this in the operating room under monitored sedation as an outpatient.  I have discussed the indications and details of surgery with the patient. Risks and complications have been outlined, including but not limited to bleeding, infection, having  positive margins on thesecond excision, skin necrosis, cosmetic deformity, and other  problems. She understands these issues. Her questions were answered. She agrees with this plan.       Mabry Tift M 05/29/2011, 10:26 AM

## 2011-05-30 ENCOUNTER — Telehealth (INDEPENDENT_AMBULATORY_CARE_PROVIDER_SITE_OTHER): Payer: Self-pay

## 2011-05-30 NOTE — Telephone Encounter (Signed)
Clearance and ok to d/c coumadin here and scanned in to epic.  Orders to surgery schedulers.

## 2011-05-30 NOTE — Telephone Encounter (Signed)
Pt advised via phone that we have clearance and she is ok to d/c coumadin and aspirin 5 days before surgery. She states she understands.

## 2011-06-02 ENCOUNTER — Encounter (HOSPITAL_COMMUNITY): Payer: Self-pay | Admitting: Pharmacy Technician

## 2011-06-04 ENCOUNTER — Encounter (HOSPITAL_COMMUNITY)
Admission: RE | Admit: 2011-06-04 | Discharge: 2011-06-04 | Disposition: A | Discharge status: Home / Self Care | Payer: No Typology Code available for payment source | Source: Ambulatory Visit | Attending: General Surgery | Admitting: General Surgery

## 2011-06-04 ENCOUNTER — Encounter (HOSPITAL_COMMUNITY): Payer: Self-pay

## 2011-06-04 HISTORY — DX: Atherosclerotic heart disease of native coronary artery without angina pectoris: I25.10

## 2011-06-04 HISTORY — DX: Presence of coronary angioplasty implant and graft: Z95.5

## 2011-06-04 HISTORY — DX: Unspecified malignant neoplasm of skin, unspecified: C44.90

## 2011-06-04 LAB — SURGICAL PCR SCREEN
MRSA, PCR: NEGATIVE
Staphylococcus aureus: NEGATIVE

## 2011-06-04 LAB — CBC
Hemoglobin: 14.1 g/dL (ref 12.0–15.0)
MCH: 32.6 pg (ref 26.0–34.0)
MCV: 93.3 fL (ref 78.0–100.0)
RBC: 4.33 MIL/uL (ref 3.87–5.11)

## 2011-06-04 LAB — BASIC METABOLIC PANEL
CO2: 27 mEq/L (ref 19–32)
Calcium: 9.4 mg/dL (ref 8.4–10.5)
Creatinine, Ser: 1.01 mg/dL (ref 0.50–1.10)
Glucose, Bld: 96 mg/dL (ref 70–99)
Sodium: 137 mEq/L (ref 135–145)

## 2011-06-04 LAB — PROTIME-INR: INR: 1.13 (ref 0.00–1.49)

## 2011-06-04 MED ORDER — CHLORHEXIDINE GLUCONATE 4 % EX LIQD: 1.0000 "application " | Freq: Once | CUTANEOUS | Status: DC

## 2011-06-04 NOTE — Patient Instructions (Addendum)
20 Tiffany Charles  06/04/2011   Your procedure is scheduled on:  06/06/11  Report to [facility] at 9:30 AM.  Call this number if you have problems the morning of surgery: [phone]   Remember:   Do not eat food:After Midnight.  May have clear liquids:until Midnight .  Clear liquids include soda, tea, black coffee, apple or grape juice, broth.  Take these medicines the morning of surgery with A SIP OF WATER: NONE (TAKE MEDS THE NIGHT BEFORE SURGERY AS USUAL)   Do not wear jewelry, make-up or nail polish.  Do not wear lotions, powders, or perfumes. You may wear deodorant.  Do not shave 48 hours prior to surgery.  Do not bring valuables to the hospital.  Contacts, dentures or bridgework may not be worn into surgery.  Leave suitcase in the car. After surgery it may be brought to your room.  For patients admitted to the hospital, checkout time is 11:00 AM the day of discharge.   Patients discharged the day of surgery will not be allowed to drive home.  Name and phone number of your driver:   Special Instructions: CHG Shower Use Special Wash: 1/2 bottle night before surgery and 1/2 bottle morning of surgery.   Please read over the following fact sheets that you were given: MRSA Information

## 2011-06-05 ENCOUNTER — Encounter (INDEPENDENT_AMBULATORY_CARE_PROVIDER_SITE_OTHER): Payer: Self-pay | Admitting: Family Medicine

## 2011-06-05 NOTE — H&P (Signed)
Tiffany Charles     MRN: [id number]   Description: 78 year old female  Provider: Ernestene Mention, MD  Department: [facility]    Diagnoses     Squamous cell skin cancer, clavicle   - Primary    173.52                History and Physical   Ernestene Mention, MD Patient ID: Tiffany Charles, female   DOB: [birth year], 78 y.o.   MRN: [id number]    Chief Complaint   Patient presents with   .  Other       squamous cell ca chest wall      HPI ALDEA Charles is a 78 y.o. female.  This patient is referred to me by Dr. Aida Puffer at [facility] for wide local excision of a squamous cell cancer of the skin overlying the left clavicle. Dr. Caprice Kluver is her cardiologist.  The patient is very pleasant patient, she is independent. She is followed for pulmonary hypertension, three-vessel coronary artery disease status post stent placement, atrial fibrillation, hyperlipidemia.  The patient states that she noticed a red bump overlying her left clavicle recently. Over the space of about 3 weeks enlarged. Dr. Aida Puffer excise this in the office recently. The sutures are still in place. The pathology report showed well differentiated squamous cell carcinoma. This involves the underlying dermis. Margins are involved. She has had no problems healing from this. She has no prior history of skin cancer.     Past Medical History   Diagnosis  Date   .  Campath-induced atrial fibrillation     .  Chronic ischemic heart disease, unspecified     .  Angina pectoris     .  Hyperlipidemia     .  Myocardial infarction     .  Arthritis         Past Surgical History   Procedure  Date   .  Abdominal hysterectomy     .  Hip surgery         Family History   Problem  Relation  Age of Onset   .  Stroke  Mother        Social History History   Substance Use Topics   .  Smoking status:  Former Smoker       Quit date:  05/28/2010   .  Smokeless tobacco:  Not on file   .  Alcohol  Use:  No       Allergies   Allergen  Reactions   .  Codeine  Nausea And Vomiting       Current Outpatient Prescriptions   Medication  Sig  Dispense  Refill   .  aspirin 81 MG tablet  Take 160 mg by mouth daily.           .  digoxin (LANOXIN) 0.125 MG tablet  Take 125 mcg by mouth daily.           Marland Kitchen  diltiazem (TIAZAC) 300 MG 24 hr capsule  Take 300 mg by mouth daily.           .  isosorbide mononitrate (IMDUR) 60 MG 24 hr tablet  Take 60 mg by mouth daily.           .  pravastatin (PRAVACHOL) 80 MG tablet  Take 80 mg by mouth daily.           Marland Kitchen  warfarin (COUMADIN)  5 MG tablet  Take 5 mg by mouth daily.              ROS Constitutional: Negative for fever, chills and unexpected weight change.  HENT: Negative for hearing loss, congestion, sore throat, trouble swallowing and voice change.   Eyes: Negative for visual disturbance.  Respiratory: Negative for cough and wheezing.   Cardiovascular: Negative for chest pain, palpitations and leg swelling.  Gastrointestinal: Negative for nausea, vomiting, abdominal pain, diarrhea, constipation, blood in stool, abdominal distention and anal bleeding.  Genitourinary: Negative for hematuria, vaginal bleeding and difficulty urinating.  Musculoskeletal: Negative for arthralgias.  Skin: Negative for rash and wound.  Neurological: Negative for seizures, syncope and headaches.  Hematological: Negative for adenopathy. Does not bruise/bleed easily.  Psychiatric/Behavioral: Negative for confusion.    Blood pressure 120/78, pulse 68, temperature 98.2 F (36.8 C), resp. rate 18, height 5' 7.5" (1.715 m), weight 140 lb (63.504 kg).   Physical Exam   Constitutional: She is oriented to person, place, and time. She appears well-developed and well-nourished. No distress.  HENT:   Head: Normocephalic and atraumatic.   Mouth/Throat: No oropharyngeal exudate.  Eyes: Conjunctivae and EOM are normal. Pupils are equal, round, and reactive to light. Right eye  exhibits no discharge. Left eye exhibits no discharge. No scleral icterus.  Neck: Normal range of motion. Neck supple. No thyromegaly present.  Cardiovascular: Normal rate and normal heart sounds.    No murmur heard.      Irreg. irreg.  Pulmonary/Chest: Effort normal and breath sounds normal. No respiratory distress. She has no wheezes. She has no rales. She exhibits no tenderness.       No axillary adenopathy  Musculoskeletal: Normal range of motion. She exhibits no edema and no tenderness.  Lymphadenopathy:    She has no cervical adenopathy.  Neurological: She is alert and oriented to person, place, and time. She exhibits normal muscle tone. Coordination normal.  Skin: Skin is warm and dry. No rash noted. She is not diaphoretic. No erythema. No pallor.     Psychiatric: She has a normal mood and affect. Her behavior is normal. Judgment and thought content normal.    Data Reviewed I reviewed Dr. Fayrene Fearing Little's office note and the pathology report   Assessment  Well differentiated squamous cell carcinoma of the skin overlying the left clavicle. Status post recent excision with positive margins.  Chronic atrial fibrillation on Coumadin  Ischemic heart disease, status post stent placement  Hyperlipidemia  Status post right total hip replacement  Status post vaginal hysterectomy   Plan   Schedule for a wide local excision of the left clavicular skin cancer site. I think we can do this is simply by making a bigger ellipse along the same axis as the original incision, taking dissection down to the muscle fascia and get a primary closure.  We will ask Dr. Caprice Kluver if we can discontinue the Coumadin 5 days preop  We will do this in the operating room under monitored sedation as an outpatient.  I have discussed the indications and details of surgery with the patient. Risks and complications have been outlined, including but not limited to bleeding, infection, having  positive  margins on thesecond excision, skin necrosis, cosmetic deformity, and other  problems. She understands these issues. Her questions were answered. She agrees with this plan.    Angelia Mould. Derrell Lolling, M.D., [facility] General and Minimally invasive Surgery Breast and Colorectal Surgery Office:   [phone] Pager:  [phone]  06/05/2011 2:11 PM

## 2011-06-06 ENCOUNTER — Other Ambulatory Visit (INDEPENDENT_AMBULATORY_CARE_PROVIDER_SITE_OTHER): Payer: Self-pay | Admitting: General Surgery

## 2011-06-06 ENCOUNTER — Ambulatory Visit (HOSPITAL_COMMUNITY): Payer: No Typology Code available for payment source | Admitting: Anesthesiology

## 2011-06-06 ENCOUNTER — Encounter (HOSPITAL_COMMUNITY)
Admission: RE | Disposition: A | Discharge status: Home / Self Care | Payer: Self-pay | Source: Ambulatory Visit | Attending: General Surgery

## 2011-06-06 ENCOUNTER — Ambulatory Visit (HOSPITAL_COMMUNITY)
Admission: RE | Admit: 2011-06-06 | Discharge: 2011-06-06 | Disposition: A | Discharge status: Home / Self Care | Payer: No Typology Code available for payment source | Source: Ambulatory Visit | Attending: General Surgery | Admitting: General Surgery

## 2011-06-06 ENCOUNTER — Encounter (HOSPITAL_COMMUNITY): Payer: Self-pay | Admitting: Anesthesiology

## 2011-06-06 ENCOUNTER — Encounter (HOSPITAL_COMMUNITY): Payer: Self-pay | Admitting: *Deleted

## 2011-06-06 DIAGNOSIS — I4891 Unspecified atrial fibrillation: Secondary | ICD-10-CM | POA: Insufficient documentation

## 2011-06-06 DIAGNOSIS — C44599 Other specified malignant neoplasm of skin of other part of trunk: Secondary | ICD-10-CM | POA: Insufficient documentation

## 2011-06-06 DIAGNOSIS — Z7982 Long term (current) use of aspirin: Secondary | ICD-10-CM | POA: Insufficient documentation

## 2011-06-06 DIAGNOSIS — E785 Hyperlipidemia, unspecified: Secondary | ICD-10-CM | POA: Insufficient documentation

## 2011-06-06 DIAGNOSIS — M129 Arthropathy, unspecified: Secondary | ICD-10-CM | POA: Insufficient documentation

## 2011-06-06 DIAGNOSIS — C44509 Unspecified malignant neoplasm of skin of other part of trunk: Secondary | ICD-10-CM | POA: Insufficient documentation

## 2011-06-06 DIAGNOSIS — Z90710 Acquired absence of both cervix and uterus: Secondary | ICD-10-CM | POA: Insufficient documentation

## 2011-06-06 DIAGNOSIS — I209 Angina pectoris, unspecified: Secondary | ICD-10-CM | POA: Insufficient documentation

## 2011-06-06 DIAGNOSIS — C44529 Squamous cell carcinoma of skin of other part of trunk: Secondary | ICD-10-CM

## 2011-06-06 DIAGNOSIS — Z96649 Presence of unspecified artificial hip joint: Secondary | ICD-10-CM | POA: Insufficient documentation

## 2011-06-06 DIAGNOSIS — I252 Old myocardial infarction: Secondary | ICD-10-CM | POA: Insufficient documentation

## 2011-06-06 DIAGNOSIS — Z7901 Long term (current) use of anticoagulants: Secondary | ICD-10-CM | POA: Insufficient documentation

## 2011-06-06 DIAGNOSIS — Z01812 Encounter for preprocedural laboratory examination: Secondary | ICD-10-CM | POA: Insufficient documentation

## 2011-06-06 DIAGNOSIS — L905 Scar conditions and fibrosis of skin: Secondary | ICD-10-CM | POA: Insufficient documentation

## 2011-06-06 DIAGNOSIS — Z79899 Other long term (current) drug therapy: Secondary | ICD-10-CM | POA: Insufficient documentation

## 2011-06-06 HISTORY — PX: MELANOMA EXCISION: SHX5266

## 2011-06-06 SURGERY — EXCISION, MELANOMA
Anesthesia: Monitor Anesthesia Care | Site: Chest | Laterality: Left | Wound class: Clean

## 2011-06-06 MED ORDER — LIDOCAINE-EPINEPHRINE 1 %-1:100000 IJ SOLN
INTRAMUSCULAR | Status: DC | PRN
  Administered 2011-06-06: 12.6 mL

## 2011-06-06 MED ORDER — LIDOCAINE HCL 1 % IJ SOLN
INTRAMUSCULAR | Status: AC
  Filled 2011-06-06: qty 20

## 2011-06-06 MED ORDER — CEFAZOLIN SODIUM 1-5 GM-% IV SOLN
1.0000 g | INTRAVENOUS | Status: AC
  Administered 2011-06-06: 1 g via INTRAVENOUS

## 2011-06-06 MED ORDER — PROMETHAZINE HCL 25 MG/ML IJ SOLN: 6.2500 mg | INTRAMUSCULAR | Status: DC | PRN

## 2011-06-06 MED ORDER — SODIUM BICARBONATE 4 % IV SOLN
INTRAVENOUS | Status: DC | PRN
  Administered 2011-06-06: 1.4 mL via INTRAVENOUS

## 2011-06-06 MED ORDER — PROPOFOL 10 MG/ML IV BOLUS
INTRAVENOUS | Status: DC | PRN
  Administered 2011-06-06: 10 mg via INTRAVENOUS
  Administered 2011-06-06: 20 mg via INTRAVENOUS
  Administered 2011-06-06 (×2): 10 mg via INTRAVENOUS

## 2011-06-06 MED ORDER — LACTATED RINGERS IV SOLN: INTRAVENOUS | Status: DC

## 2011-06-06 MED ORDER — HEPARIN SODIUM (PORCINE) 5000 UNIT/ML IJ SOLN
5000.0000 [IU] | Freq: Once | INTRAMUSCULAR | Status: AC
  Administered 2011-06-06: 5000 [IU] via SUBCUTANEOUS

## 2011-06-06 MED ORDER — LIDOCAINE-EPINEPHRINE 1 %-1:100000 IJ SOLN
INTRAMUSCULAR | Status: AC
Start: 2011-06-06 — End: 2011-06-06
  Filled 2011-06-06: qty 1

## 2011-06-06 MED ORDER — FENTANYL CITRATE 0.05 MG/ML IJ SOLN
INTRAMUSCULAR | Status: DC | PRN
  Administered 2011-06-06 (×3): 50 ug via INTRAVENOUS

## 2011-06-06 MED ORDER — SODIUM BICARBONATE 4 % IV SOLN
INTRAVENOUS | Status: AC
  Filled 2011-06-06: qty 5

## 2011-06-06 MED ORDER — HEPARIN SODIUM (PORCINE) 5000 UNIT/ML IJ SOLN
INTRAMUSCULAR | Status: AC
  Administered 2011-06-06: 5000 [IU] via SUBCUTANEOUS
  Filled 2011-06-06: qty 1

## 2011-06-06 MED ORDER — LACTATED RINGERS IV SOLN
INTRAVENOUS | Status: DC
  Administered 2011-06-06: 1000 mL via INTRAVENOUS

## 2011-06-06 MED ORDER — BUPIVACAINE HCL (PF) 0.25 % IJ SOLN
INTRAMUSCULAR | Status: AC
  Filled 2011-06-06: qty 30

## 2011-06-06 MED ORDER — FENTANYL CITRATE 0.05 MG/ML IJ SOLN: 25.0000 ug | INTRAMUSCULAR | Status: DC | PRN

## 2011-06-06 MED ORDER — LIDOCAINE HCL (CARDIAC) 20 MG/ML IV SOLN
INTRAVENOUS | Status: DC | PRN
  Administered 2011-06-06: 25 mg via INTRAVENOUS

## 2011-06-06 MED ORDER — CEFAZOLIN SODIUM 1-5 GM-% IV SOLN
INTRAVENOUS | Status: AC
  Filled 2011-06-06: qty 50

## 2011-06-06 MED ORDER — HYDROCODONE-ACETAMINOPHEN 5-325 MG PO TABS: 1.0000 | ORAL_TABLET | ORAL | Status: AC | PRN

## 2011-06-06 SURGICAL SUPPLY — 33 items
ADH SKN CLS APL DERMABOND .7 (GAUZE/BANDAGES/DRESSINGS) ×1
BLADE HEX COATED 2.75 (ELECTRODE) ×2 IMPLANT
BLADE SURG 15 STRL LF DISP TIS (BLADE) ×1 IMPLANT
BLADE SURG 15 STRL SS (BLADE) ×2
BLADE SURG SZ10 CARB STEEL (BLADE) ×2 IMPLANT
CLOTH BEACON ORANGE TIMEOUT ST (SAFETY) ×2 IMPLANT
DECANTER SPIKE VIAL GLASS SM (MISCELLANEOUS) ×2 IMPLANT
DERMABOND ADVANCED (GAUZE/BANDAGES/DRESSINGS) ×1
DERMABOND ADVANCED .7 DNX12 (GAUZE/BANDAGES/DRESSINGS) IMPLANT
DRAPE LAPAROSCOPIC ABDOMINAL (DRAPES) ×1 IMPLANT
DRAPE LG THREE QUARTER DISP (DRAPES) ×1 IMPLANT
DRAPE POUCH INSTRU U-SHP 10X18 (DRAPES) ×2 IMPLANT
ELECT REM PT RETURN 9FT ADLT (ELECTROSURGICAL) ×2
ELECTRODE REM PT RTRN 9FT ADLT (ELECTROSURGICAL) ×1 IMPLANT
GLOVE BIOGEL PI IND STRL 7.0 (GLOVE) ×1 IMPLANT
GLOVE BIOGEL PI INDICATOR 7.0 (GLOVE) ×1
GLOVE EUDERMIC 7 POWDERFREE (GLOVE) ×2 IMPLANT
GOWN STRL NON-REIN LRG LVL3 (GOWN DISPOSABLE) ×2 IMPLANT
GOWN STRL REIN XL XLG (GOWN DISPOSABLE) ×3 IMPLANT
KIT BASIN OR (CUSTOM PROCEDURE TRAY) ×2 IMPLANT
NDL HYPO 25X1 1.5 SAFETY (NEEDLE) ×1 IMPLANT
NEEDLE HYPO 25X1 1.5 SAFETY (NEEDLE) ×2 IMPLANT
NS IRRIG 1000ML POUR BTL (IV SOLUTION) ×2 IMPLANT
PACK BASIC VI WITH GOWN DISP (CUSTOM PROCEDURE TRAY) ×2 IMPLANT
PEN SKIN MARKING BROAD (MISCELLANEOUS) ×1 IMPLANT
PENCIL BUTTON HOLSTER BLD 10FT (ELECTRODE) ×2 IMPLANT
SOL PREP POV-IOD 16OZ 10% (MISCELLANEOUS) ×2 IMPLANT
SPONGE LAP 4X18 X RAY DECT (DISPOSABLE) ×4 IMPLANT
SUT MNCRL AB 4-0 PS2 18 (SUTURE) ×1 IMPLANT
SUT VIC AB 3-0 SH 18 (SUTURE) ×1 IMPLANT
SYR CONTROL 10ML LL (SYRINGE) ×2 IMPLANT
TOWEL OR 17X26 10 PK STRL BLUE (TOWEL DISPOSABLE) ×2 IMPLANT
YANKAUER SUCT BULB TIP 10FT TU (MISCELLANEOUS) ×1 IMPLANT

## 2011-06-06 NOTE — Transfer of Care (Signed)
Immediate Anesthesia Transfer of Care Note  Patient: Tiffany Charles  Procedure(s) Performed:  MELANOMA EXCISION - re excision squamous cell lesion left chest [name]   Patient Location: PACU  Anesthesia Type: MAC  Level of Consciousness: awake and alert   Airway & Oxygen Therapy: Patient Spontanous Breathing and Patient connected to face mask oxygen  Post-op Assessment: Report given to PACU RN  Post vital signs: Reviewed and stable Filed Vitals:   06/06/11 0933  BP: 152/84  Pulse: 71  Temp: 36.3 C  Resp: 20    Complications: No apparent anesthesia complications

## 2011-06-06 NOTE — Op Note (Signed)
Patient Name:           Tiffany Charles   Date of Surgery:        06/06/2011  Pre op Diagnosis:      Squamous cell carcinoma, scan of anterior chest  Post op Diagnosis:    same  Procedure:                 Wide local re-excision of squamous cell carcinoma of left anterior chest, 8 X 4 cm. Total excision dimensions.  Surgeon:                     Angelia Mould. Derrell Lolling, M.D., FACS  Assistant:                        Operative Indications:   This is a 78 year old Caucasian female who has pulmonary hypertension, coronary artery disease, atrial fibrillation and history of myocardial infarction. She noticed a red skin lesion overlying her left clavicle recently. Dr. Aida Puffer excised this in the office. Pathology report showed well-differentiated squamous cell carcinoma involving the underlying dermis with involved margins. She was referred for wide local excision. I have examined her in the office. She is brought to the operating room electively for wide local re-excision of her skin cancer.  Operative Findings:       There was a 3 cm scar in the left anterior chest overlying the left clavicle which appears to be healing without any problems. There are sutures present which were removed. There is no adenopathy. The total excision was 8 cm transversely by 4 cm vertically. Primary closure was achieved.  Procedure in Detail:          The patient was brought to the operating room. She was monitored and sedated by the anesthesia department. The left neck and left upper anterior chest were prepped and draped in a sterile fashion. Intravenous antibiotics were given. Surgical time out was held. 1% Xylocaine with epinephrine was used as a local infiltration anesthetic. Using a marking pen I marked a transverse elliptical incision. This was 8 cm in length and once the excision was done I measured that it was 4 cm vertically. The elliptical incision was made. Dissection was carried down all the way to the platysma  muscle and the platysma muscle was left intact. We excised all of  this area electrocautery. The skin and subcutaneous tissue was undermined off of the platysma muscle to mobilize it somewhat. We marked the superior and lateral margins of the skin and sent the specimen to the lab for  histology. Hemostasis was excellent. The subcutaneous tissue and dermis were closed with interrupted sutures of 3-0 Vicryl and the skin closed with a running subcuticular suture of 4-0 Monocryl and Dermabond. The patient tolerated the procedure well. She was taken to recovery room stable. Complications none. Counts correct. EBL 5 cc.     Angelia Mould. Derrell Lolling, M.D., FACS General and Minimally Invasive Surgery Breast and Colorectal Surgery  06/06/2011 12:21 PM

## 2011-06-06 NOTE — Interval H&P Note (Signed)
History and Physical Interval Note:  06/06/2011 11:37 AM  Tiffany Charles  has presented today for surgery, with the diagnosis of squamous cell carcinoma chest wall left   The goals of treatment and the various methods of treatment have been discussed with the patient and family. After consideration of risks, benefits and other options for treatment, the patient has consented to  Procedure(s): SKIN CANCER EXCISION LEFT CHEST WALL as a surgical intervention .  The patients' history has been reviewed, patient examined, no change in status, stable for surgery.  I have reviewed the patients' chart and labs.  Questions were answered to the patient's satisfaction.     Ernestene Mention 06/06/2011 11:38 AM

## 2011-06-06 NOTE — Anesthesia Postprocedure Evaluation (Signed)
Anesthesia Post Note  Patient: Tiffany Charles  Procedure(s) Performed:  MELANOMA EXCISION - re excision squamous cell lesion left chest wall   Anesthesia type: MAC  Patient location: PACU  Post pain: Pain level controlled  Post assessment: Post-op Vital signs reviewed  Last Vitals:  Filed Vitals:   06/06/11 1315  BP: 127/66  Pulse: 59  Temp:   Resp: 17    Post vital signs: Reviewed  Level of consciousness: sedated  Complications: No apparent anesthesia complications

## 2011-06-06 NOTE — Anesthesia Preprocedure Evaluation (Addendum)
Anesthesia Evaluation  Patient identified by MRN, date of birth, ID band Patient awake    Reviewed: Allergy & Precautions, H&P , NPO status , Patient's Chart, lab work & pertinent test results, reviewed documented beta blocker date and time   History of Anesthesia Complications Negative for: history of anesthetic complications  Airway Mallampati: III TM Distance: >3 FB Neck ROM: Full    Dental No notable dental hx. (+) Edentulous Upper, Partial Lower, Upper Dentures and Dental Advisory Given   Pulmonary neg pulmonary ROS,  clear to auscultation  Pulmonary exam normal       Cardiovascular Exercise Tolerance: Poor + angina with exertion + CAD, + Past MI and + Cardiac Stents + dysrhythmias Atrial Fibrillation Regular Normal    Neuro/Psych Negative Neurological ROS  Negative Psych ROS   GI/Hepatic negative GI ROS, Neg liver ROS,   Endo/Other  Negative Endocrine ROS  Renal/GU negative Renal ROS  Genitourinary negative   Musculoskeletal negative musculoskeletal ROS (+)   Abdominal   Peds negative pediatric ROS (+)  Hematology negative hematology ROS (+)   Anesthesia Other Findings   Reproductive/Obstetrics negative OB ROS                           Anesthesia Physical Anesthesia Plan  ASA: III  Anesthesia Plan: MAC   Post-op Pain Management:    Induction:   Airway Management Planned: Simple Face Mask  Additional Equipment:   Intra-op Plan:   Post-operative Plan:   Informed Consent: I have reviewed the patients History and Physical, chart, labs and discussed the procedure including the risks, benefits and alternatives for the proposed anesthesia with the patient or authorized representative who has indicated his/her understanding and acceptance.     Plan Discussed with: CRNA  Anesthesia Plan Comments:         Anesthesia Quick Evaluation

## 2011-06-09 ENCOUNTER — Telehealth (INDEPENDENT_AMBULATORY_CARE_PROVIDER_SITE_OTHER): Payer: Self-pay

## 2011-06-09 NOTE — Telephone Encounter (Signed)
Pt home doing well,po appt given.

## 2011-06-09 NOTE — Progress Notes (Signed)
Quick Note:  Inform patient of Pathology report,. ______ 

## 2011-06-10 ENCOUNTER — Telehealth (INDEPENDENT_AMBULATORY_CARE_PROVIDER_SITE_OTHER): Payer: Self-pay

## 2011-06-10 ENCOUNTER — Encounter (HOSPITAL_COMMUNITY): Payer: Self-pay | Admitting: General Surgery

## 2011-06-10 NOTE — Telephone Encounter (Signed)
Received a msg from pt to call with results. I could not reach pt at # given. I did reach pts son Roe Coombs. He states [place]. I gave him path result and he states he will be sure pt gets result.

## 2011-06-17 ENCOUNTER — Ambulatory Visit (INDEPENDENT_AMBULATORY_CARE_PROVIDER_SITE_OTHER): Payer: No Typology Code available for payment source | Admitting: General Surgery

## 2011-06-17 ENCOUNTER — Encounter (INDEPENDENT_AMBULATORY_CARE_PROVIDER_SITE_OTHER): Payer: Self-pay | Admitting: General Surgery

## 2011-06-17 VITALS — BP 124/68 | HR 66 | Temp 97.9°F | Resp 18 | Ht 67.5 in | Wt 142.4 lb

## 2011-06-17 DIAGNOSIS — Z9889 Other specified postprocedural states: Secondary | ICD-10-CM

## 2011-06-17 NOTE — Patient Instructions (Signed)
Your left chest incision is healing without any complication. The final pathology report showed no residual skin cancer. You have been given a copy of the report. Nothing further needs to be done. Return to see me if you have any further problems.

## 2011-06-17 NOTE — Progress Notes (Signed)
Subjective:     Patient ID: [name], female   DOB: [birth year], 78 y.o.   MRN: [id number]  HPI This patient underwent wide local re-excision of a squamous cell cancer of the skin just below the left clavicle.  date of surgery January 11. She has done well and has no complaints.  Final pathology report shows no residual squamous cell cancer. I gave her a copy of the pathology report today. She is very pleased.  Review of Systems     Objective:   Physical Exam Patient looks well. In good spirits.  Incision just below the left clavicle is healing normally. No hematoma or infection. Dermabond in place.    Assessment:     Squamous cell cancer of the skin of the left anterior chest. Status post wide local excision. No residual cancer. Excellent margins.    Plan:     Wound care discussed. Pathology report discussed. Return to see me p.r.n.  Angelia Mould. Derrell Lolling, M.D., [facility] General and Minimally invasive Surgery Breast and Colorectal Surgery Office:   [phone] Pager:   [phone]

## 2012-10-27 ENCOUNTER — Encounter: Payer: Self-pay | Admitting: Cardiovascular Disease

## 2012-10-27 ENCOUNTER — Ambulatory Visit (INDEPENDENT_AMBULATORY_CARE_PROVIDER_SITE_OTHER): Payer: No Typology Code available for payment source | Admitting: Cardiovascular Disease

## 2012-10-27 VITALS — BP 120/72 | HR 87 | Resp 20 | Ht 66.5 in | Wt 134.9 lb

## 2012-10-27 DIAGNOSIS — R079 Chest pain, unspecified: Secondary | ICD-10-CM

## 2012-10-27 DIAGNOSIS — I4821 Permanent atrial fibrillation: Secondary | ICD-10-CM

## 2012-10-27 DIAGNOSIS — I2510 Atherosclerotic heart disease of native coronary artery without angina pectoris: Secondary | ICD-10-CM

## 2012-10-27 DIAGNOSIS — I4891 Unspecified atrial fibrillation: Secondary | ICD-10-CM

## 2012-10-27 DIAGNOSIS — R0602 Shortness of breath: Secondary | ICD-10-CM

## 2012-10-27 DIAGNOSIS — E785 Hyperlipidemia, unspecified: Secondary | ICD-10-CM

## 2012-10-27 DIAGNOSIS — J449 Chronic obstructive pulmonary disease, unspecified: Secondary | ICD-10-CM

## 2012-10-27 NOTE — Patient Instructions (Addendum)
Your physician has recommended that you have a pulmonary function test. Pulmonary Function Tests are a group of tests that measure how well air moves in and out of your lungs.Your physician has recommended you make the following change in your medication: Ranexa 500mg  x 1 week, then 1000mg  x 3 weeks. Your physician recommends that you schedule a follow-up appointment in: 3-4 weeks

## 2012-10-28 ENCOUNTER — Encounter: Payer: Self-pay | Admitting: Cardiovascular Disease

## 2012-10-28 DIAGNOSIS — J449 Chronic obstructive pulmonary disease, unspecified: Secondary | ICD-10-CM | POA: Insufficient documentation

## 2012-10-28 DIAGNOSIS — I2510 Atherosclerotic heart disease of native coronary artery without angina pectoris: Secondary | ICD-10-CM | POA: Insufficient documentation

## 2012-10-28 DIAGNOSIS — E785 Hyperlipidemia, unspecified: Secondary | ICD-10-CM | POA: Insufficient documentation

## 2012-10-28 DIAGNOSIS — I4821 Permanent atrial fibrillation: Secondary | ICD-10-CM | POA: Insufficient documentation

## 2012-10-28 MED ORDER — RANOLAZINE ER 500 MG PO TB12: 500.0000 mg | ORAL_TABLET | Freq: Two times a day (BID) | ORAL | Status: DC

## 2012-10-28 MED ORDER — RANOLAZINE ER 1000 MG PO TB12: 1000.0000 mg | ORAL_TABLET | Freq: Two times a day (BID) | ORAL | Status: DC

## 2012-10-28 NOTE — Assessment & Plan Note (Addendum)
Stent to right coronary artery in 2009 (3.5 x 23 mm vision); also with high-grade stenosis of a small terminal AV groove portion of the left circumflex coronary and 30-50% lesions in the proximal and mid LAD artery; repeat cardiac catheterization in 2010 without new abnormalities, medical therapy recommended.  She is free of angina at rest, but has CCS class II angina on exertion. I suspect that her complaints of shortness of breath with activity are more likely related to chronic lung disease related to lifelong smoking, rather than to ischemic heart disease. Options for antianginal therapy a relatively limited since she is already on nitrates and calcium channel blockers and has had intolerance to beta blockers. I've recommended that we start Robaxin and given a starting dose of 500 mg twice a day for one week followed by thousand milligrams twice a day after which we'll followup in the office.

## 2012-10-28 NOTE — Progress Notes (Signed)
Patient ID: Tiffany Charles, female   DOB: 1930-04-25, 79 y.o.   MRN: [id number]      Reason for office visit Followup 3 to fibrillation, coronary artery disease, hyperlipidemia  Tiffany Charles has known multivessel coronary artery disease and has previously undergone stent placement to the distal right coronary artery. She is also known to have high-grade stenosis in the distal left circumflex coronary artery but this was felt to be inamenable to percutaneous revascularization. A nuclear stress test performed in February of 2013 did not show any evidence of meaningful ischemia. She has preserved left ventricular systolic function.  She has episodes of mild chest tightness consistent with angina during moderate exertion , whilst on treatment with calcium channel blockers and nitrates. She quit smoking successfully about 2 years ago but has a history of almost 60 years of smoking a pack a day. 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 She denies palpitations dizziness or syncope. She has a history of marked bradycardia which was treated with beta blockers in combination with diltiazem.  She has been in permanent atrial fibrillation since October of 2010. Attempted restoration of sinus rhythm with cardioversion in 2011 was only very short lived. She is on anticoagulation with peroxide then and has no history of stroke TIA or serious bleeding complications. She does have fairly frequent epistaxis in the wintertime.    Allergies  Allergen Reactions  . Codeine Nausea And Vomiting    Current Outpatient Prescriptions  Medication Sig Dispense Refill  . aspirin 81 MG tablet Take 81 mg by mouth at bedtime.       . citalopram (CELEXA) 20 MG tablet Take 20 mg by mouth daily.      Marland Kitchen diltiazem (TIAZAC) 300 MG 24 hr capsule Take 300 mg by mouth at bedtime.       . isosorbide mononitrate (IMDUR) 60 MG 24 hr tablet Take 60 mg by mouth daily.        .  nitroGLYCERIN (NITROLINGUAL) 0.4 MG/SPRAY spray Place 1 spray under the tongue every 5 (five) minutes as needed for chest pain.      . pravastatin (PRAVACHOL) 80 MG tablet Take 40 mg by mouth at bedtime.       . Rivaroxaban (XARELTO) 20 MG TABS Take 20 mg by mouth daily.      . digoxin (LANOXIN) 0.125 MG tablet Take 125 mcg by mouth at bedtime.        No current facility-administered medications for this visit.    Past Medical History  Diagnosis Date  . Atrial fibrillation     LEXISCAN, 07/23/2011 - no evidence of inducible myocardial ischemia  . Chronic ischemic heart disease, unspecified   . Angina pectoris   . Hyperlipidemia   . Myocardial infarction   . Arthritis   . Coronary artery disease   . Stented coronary artery   . Skin cancer   . Cardiomegaly     2D ECHO, 02/04/2007 - EF >55%, moderate mitral and tricuspid regurgitation    Past Surgical History  Procedure Laterality Date  . Abdominal hysterectomy    . Hip surgery    . Coronary angioplasty with stent placement    .  cataracts removed    . Melanoma excision  06/06/2011    Procedure: MELANOMA EXCISION;  Surgeon: Ernestene Mention, MD;  Location: [facility];  Service: General;  Laterality: Left;  re excision squamous cell lesion left chest wall   . Cardioversion  06/05/2009    3 attempts, last attempt successful  . Cardiac catheterization  03/22/2009    Started medical therapy  . Cardiac catheterization  06/08/2007    RCA, 3.5x25mm Vision stent deployed at 16 atomspheres  . Cardiac catheterization  03/12/2007    Family History  Problem Relation Age of Onset  . Stroke Mother   . Coronary artery disease Father   . Stroke Sister   . Heart disease Brother   . Stroke Sister   . Stroke Sister     History   Social History  . Marital Status: Widowed    Spouse Name: N/A    Number of Children: N/A  . Years of Education: N/A   Occupational History  . Not on file.   Social History Main Topics  . Smoking status: Former  Smoker    Quit date: 05/28/2010  . Smokeless tobacco: Never Used  . Alcohol Use: No  . Drug Use: No  . Sexually Active: Not on file   Other Topics Concern  . Not on file   Social History Narrative  . No narrative on file    Review of systems: The patient specifically denies any chest pain at rest, dyspnea at rest, orthopnea, paroxysmal nocturnal dyspnea, syncope, palpitations, focal neurological deficits, intermittent claudication, lower extremity edema, unexplained weight gain, hemoptysis..  The patient also denies abdominal pain, nausea, vomiting, dysphagia, diarrhea, constipation, polyuria, polydipsia, dysuria, hematuria, frequency, urgency,  fever, chills, unexpected weight changes, mood swings, change in skin or hair texture, change in voice quality, auditory or visual problems, allergic reactions or rashes, new musculoskeletal complaints other than usual "aches and pains".  PHYSICAL EXAM BP 120/72  Pulse 87  Resp 20  Ht 5' 6.5" (1.689 m)  Wt 134 lb 14.4 oz (61.19 kg)  BMI 21.45 kg/m2  General: Alert, oriented x3, no distress; she is very slender Head: no evidence of trauma, PERRL, EOMI, no exophtalmos or lid lag, no myxedema, no xanthelasma; normal ears, nose and oropharynx Neck: normal jugular venous pulsations and no hepatojugular reflux; brisk carotid pulses without delay and no carotid bruits Chest: clear to auscultation, no signs of consolidation by percussion or palpation, normal fremitus, symmetrical and full respiratory excursions Cardiovascular: normal position and quality of the apical impulse, irregular rhythm, normal first and second heart sounds, no rubs or gallops, grade 1/6 early peaking systolic ejection murmur heard best at the left sternal border Abdomen: no tenderness or distention, no masses by palpation, no abnormal pulsatility or arterial bruits, normal bowel sounds, no hepatosplenomegaly Extremities: no clubbing, cyanosis or edema; 2+ radial, ulnar and  brachial pulses bilaterally; 2+ right femoral, posterior tibial and dorsalis pedis pulses; 2+ left femoral, posterior tibial and dorsalis pedis pulses; no subclavian or femoral bruits Neurological: grossly nonfocal   EKG: Atrial fibrillation QS pattern in lead V1 V2 otherwise normal  Lipid Panel  No results found for this basename: chol, trig, hdl, cholhdl, vldl, ldlcalc    BMET    Component Value Date/Time   NA 137 06/04/2011 1215   K 4.4 06/04/2011 1215   CL 102 06/04/2011 1215   CO2 27 06/04/2011 1215   GLUCOSE 96 06/04/2011 1215   BUN 15 06/04/2011 1215   CREATININE 1.01 06/04/2011 1215   CALCIUM 9.4 06/04/2011 1215   GFRNONAA 52* 06/04/2011 1215   GFRAA 60* 06/04/2011 1215     ASSESSMENT AND PLAN Permanent atrial fibrillation Good ventricular rate control on combination of diltiazem and digoxin; beta blockers are relatively contraindicated due to  suspected COPD; on Xarelto anticoagulation without bleeding complications; no history of stroke or TIA  CAD (coronary artery disease) Stent to right coronary artery in 2009 (3.5 x 23 mm vision); also with high-grade stenosis of a small terminal AV groove portion of the left circumflex coronary and 30-50% lesions in the proximal and mid LAD artery; repeat cardiac catheterization in 2010 without new abnormalities, medical therapy recommended.  She is free of angina either at rest or with activity. I suspect that her complaints of shortness of breath with activity are more likely related to chronic lung disease related to lifelong smoking  Hyperlipidemia She is on statin therapy and it is time to repeat her lipid profile and liver function tests  COPD (chronic obstructive pulmonary disease) This is a presumptive diagnosis based on the fact that she has a history of smoking of almost 60 pack years. I have renewed her prescription for bronchodilators. It may be worthwhile to perform a formal lung function tests with bronchodilators.  Orders Placed This  Encounter  Procedures  . PFT W/ BRONCH (MET-TEST)  . EKG 12-Lead   No orders of the defined types were placed in this encounter.    Junious Silk, MD, [facility] [phone] office [phone] pager  2

## 2012-10-28 NOTE — Assessment & Plan Note (Signed)
This is a presumptive diagnosis based on the fact that she has a history of smoking of almost 60 pack years. I have renewed her prescription for bronchodilators. It may be worthwhile to perform a formal lung function tests with bronchodilators.

## 2012-10-28 NOTE — Assessment & Plan Note (Signed)
She is on statin therapy and it is time to repeat her lipid profile and liver function tests

## 2012-10-28 NOTE — Assessment & Plan Note (Signed)
Good ventricular rate control on combination of diltiazem and digoxin; beta blockers are relatively contraindicated due to suspected COPD; on Xarelto anticoagulation without bleeding complications; no history of stroke or TIA

## 2012-11-08 ENCOUNTER — Encounter (INDEPENDENT_AMBULATORY_CARE_PROVIDER_SITE_OTHER): Payer: No Typology Code available for payment source

## 2012-11-08 DIAGNOSIS — R062 Wheezing: Secondary | ICD-10-CM

## 2012-11-08 LAB — PULMONARY FUNCTION TEST

## 2012-12-06 ENCOUNTER — Telehealth: Payer: Self-pay | Admitting: *Deleted

## 2012-12-06 NOTE — Telephone Encounter (Signed)
Message copied by Tobin Chad on Mon Dec 06, 2012 10:06 AM ------      Message from: Thurmon Fair      Created: Sun Dec 05, 2012  7:04 PM       Study is compatible with mild-moderate COPD ------

## 2012-12-06 NOTE — Telephone Encounter (Signed)
Spoke to patient. Results given. Voiced understanding. 

## 2012-12-08 ENCOUNTER — Ambulatory Visit (INDEPENDENT_AMBULATORY_CARE_PROVIDER_SITE_OTHER): Payer: No Typology Code available for payment source | Admitting: Cardiovascular Disease

## 2012-12-08 VITALS — BP 122/70 | HR 64 | Resp 18 | Ht 67.0 in | Wt 135.0 lb

## 2012-12-08 DIAGNOSIS — I2510 Atherosclerotic heart disease of native coronary artery without angina pectoris: Secondary | ICD-10-CM

## 2012-12-08 DIAGNOSIS — I4891 Unspecified atrial fibrillation: Secondary | ICD-10-CM

## 2012-12-08 DIAGNOSIS — I4821 Permanent atrial fibrillation: Secondary | ICD-10-CM

## 2012-12-08 DIAGNOSIS — R0602 Shortness of breath: Secondary | ICD-10-CM

## 2012-12-08 DIAGNOSIS — J4489 Other specified chronic obstructive pulmonary disease: Secondary | ICD-10-CM

## 2012-12-08 DIAGNOSIS — J449 Chronic obstructive pulmonary disease, unspecified: Secondary | ICD-10-CM

## 2012-12-08 DIAGNOSIS — R0789 Other chest pain: Secondary | ICD-10-CM

## 2012-12-08 DIAGNOSIS — E785 Hyperlipidemia, unspecified: Secondary | ICD-10-CM

## 2012-12-08 DIAGNOSIS — R079 Chest pain, unspecified: Secondary | ICD-10-CM

## 2012-12-08 MED ORDER — RIVAROXABAN 20 MG PO TABS: 20.0000 mg | ORAL_TABLET | Freq: Every day | ORAL | Status: DC

## 2012-12-08 MED ORDER — ALBUTEROL SULFATE HFA 108 (90 BASE) MCG/ACT IN AERS: 2.0000 | INHALATION_SPRAY | Freq: Four times a day (QID) | RESPIRATORY_TRACT | Status: DC | PRN

## 2012-12-08 MED ORDER — RANOLAZINE ER 1000 MG PO TB12: 1000.0000 mg | ORAL_TABLET | Freq: Two times a day (BID) | ORAL | Status: DC

## 2012-12-08 NOTE — Patient Instructions (Addendum)
Increase Ranexa to 1000mg  twice a day.  New Rx sent to [facility].  Sample given.  Prescription sent in for an Albuterol Inhaler.  Your physician recommends that you schedule a follow-up appointment in: 6 months

## 2012-12-14 ENCOUNTER — Encounter: Payer: Self-pay | Admitting: Cardiovascular Disease

## 2012-12-14 NOTE — Assessment & Plan Note (Signed)
On statin therapy. It's time to repeat a lipid profile.

## 2012-12-14 NOTE — Progress Notes (Signed)
Patient ID: Tiffany Charles, female   DOB: 02/06/1930, 79 y.o.   MRN: [id number]     Reason for office visit CAD, shortness of breath, atrial fibrillation followup  Tiffany Charles did well after we started ranolazine. She seems to have less exertional chest discomfort. Unfortunately she misunderstood instructions and is taking the 500 mg twice a day dose now after having started with the 1000 mg twice a day dose. Most important he did not have any side effects of the medication.  Her pulmonary function testing for mild-to-moderate obstructive lung disease, consistent with her long-standing history of past smoking.    Allergies  Allergen Reactions  . Codeine Nausea And Vomiting    Current Outpatient Prescriptions  Medication Sig Dispense Refill  . aspirin 81 MG tablet Take 81 mg by mouth at bedtime.       . citalopram (CELEXA) 20 MG tablet Take 20 mg by mouth daily.      . digoxin (LANOXIN) 0.125 MG tablet Take 125 mcg by mouth at bedtime.       Marland Kitchen diltiazem (TIAZAC) 300 MG 24 hr capsule Take 300 mg by mouth at bedtime.       . isosorbide mononitrate (IMDUR) 60 MG 24 hr tablet Take 30 mg by mouth daily.       . nitroGLYCERIN (NITROLINGUAL) 0.4 MG/SPRAY spray Place 1 spray under the tongue every 5 (five) minutes as needed for chest pain.      . pravastatin (PRAVACHOL) 80 MG tablet Take 40 mg by mouth at bedtime.       . ranolazine (RANEXA) 500 MG 12 hr tablet Take 1 tablet (500 mg total) by mouth 2 (two) times daily.  14 tablet  0  . Rivaroxaban (XARELTO) 20 MG TABS Take 1 tablet (20 mg total) by mouth daily.  90 tablet  3  . albuterol (PROVENTIL HFA;VENTOLIN HFA) 108 (90 BASE) MCG/ACT inhaler Inhale 2 puffs into the lungs every 6 (six) hours as needed for wheezing.  1 Inhaler  2  . ranolazine (RANEXA) 1000 MG SR tablet Take 1 tablet (1,000 mg total) by mouth 2 (two) times daily.  180 tablet  3   No current facility-administered medications for this visit.    Past Medical History    Diagnosis Date  . Atrial fibrillation     LEXISCAN, 07/23/2011 - no evidence of inducible myocardial ischemia  . Chronic ischemic heart disease, unspecified   . Angina pectoris   . Hyperlipidemia   . Myocardial infarction   . Arthritis   . Coronary artery disease   . Stented coronary artery   . Skin cancer   . Cardiomegaly     2D ECHO, 02/04/2007 - EF >55%, moderate mitral and tricuspid regurgitation    Past Surgical History  Procedure Laterality Date  . Abdominal hysterectomy    . Hip surgery    . Coronary angioplasty with stent placement    .  cataracts removed    . Melanoma excision  06/06/2011    Procedure: MELANOMA EXCISION;  Surgeon: Ernestene Mention, MD;  Location: [facility];  Service: General;  Laterality: Left;  re excision squamous cell lesion left chest wall   . Cardioversion  06/05/2009    3 attempts, last attempt successful  . Cardiac catheterization  03/22/2009    Started medical therapy  . Cardiac catheterization  06/08/2007    RCA, 3.5x60mm Vision stent deployed at 16 atomspheres  . Cardiac catheterization  03/12/2007    Family History  Problem  Relation Age of Onset  . Stroke Mother   . Coronary artery disease Father   . Stroke Sister   . Heart disease Brother   . Stroke Sister   . Stroke Sister     History   Social History  . Marital Status: Widowed    Spouse Name: N/A    Number of Children: N/A  . Years of Education: N/A   Occupational History  . Not on file.   Social History Main Topics  . Smoking status: Former Smoker    Quit date: 05/28/2010  . Smokeless tobacco: Never Used  . Alcohol Use: No  . Drug Use: No  . Sexually Active: Not on file   Other Topics Concern  . Not on file   Social History Narrative  . No narrative on file    Review of systems: The patient specifically denies any chest pain at rest but has occasional mild exertional chest discomfort, she has occasional dyspnea at rest or with exertion, but denies orthopnea,  paroxysmal nocturnal dyspnea, syncope, palpitations, focal neurological deficits, intermittent claudication, lower extremity edema, unexplained weight gain, cough, hemoptysis. There is rare wheezing.  The patient also denies abdominal pain, nausea, vomiting, dysphagia, diarrhea, constipation, polyuria, polydipsia, dysuria, hematuria, frequency, urgency, abnormal bleeding or bruising, fever, chills, unexpected weight changes, mood swings, change in skin or hair texture, change in voice quality, auditory or visual problems, allergic reactions or rashes, new musculoskeletal complaints other than usual "aches and pains".   PHYSICAL EXAM BP 122/70  Pulse 64  Resp 18  Ht 5\' 7"  (1.702 m)  Wt 135 lb (61.236 kg)  BMI 21.14 kg/m2  General: Alert, oriented x3, no distress Head: no evidence of trauma, PERRL, EOMI, no exophtalmos or lid lag, no myxedema, no xanthelasma; normal ears, nose and oropharynx Neck: normal jugular venous pulsations and no hepatojugular reflux; brisk carotid pulses without delay and no carotid bruits Chest: clear to auscultation, no signs of consolidation by percussion or palpation, normal fremitus, symmetrical and full respiratory excursions Cardiovascular: normal position and quality of the apical impulse, regular rhythm, normal first and second heart sounds, no  rubs or gallops; 1/6 systolic murmur at the left lower sternal border unchanged Abdomen: no tenderness or distention, no masses by palpation, no abnormal pulsatility or arterial bruits, normal bowel sounds, no hepatosplenomegaly Extremities: no clubbing, cyanosis or edema; 2+ radial, ulnar and brachial pulses bilaterally; 2+ right femoral, posterior tibial and dorsalis pedis pulses; 2+ left femoral, posterior tibial and dorsalis pedis pulses; no subclavian or femoral bruits Neurological: grossly nonfocal   EKG: Atrial fibrillation, QS pattern in leads V1 and V2, minimal repolarization amenities consistent with digoxin  effect  Lipid Panel  No results found for this basename: chol, trig, hdl, cholhdl, vldl, ldlcalc    BMET    Component Value Date/Time   NA 137 06/04/2011 1215   K 4.4 06/04/2011 1215   CL 102 06/04/2011 1215   CO2 27 06/04/2011 1215   GLUCOSE 96 06/04/2011 1215   BUN 15 06/04/2011 1215   CREATININE 1.01 06/04/2011 1215   CALCIUM 9.4 06/04/2011 1215   GFRNONAA 52* 06/04/2011 1215   GFRAA 60* 06/04/2011 1215     ASSESSMENT AND PLAN CAD (coronary artery disease) The Ranexa seems to have helped her exertional angina. She is to take the target 1000 mg twice a day dose. She seems to tolerate without side effects. She is also on calcium channel blockers and long-acting nitrates. We are trying to avoid beta blockers because  of wheezing and a long-standing history of smoking in the past. She has known high-grade stenosis that is not amenable to revascularization in the distal portion of the AV groove branch of the left circumflex coronary artery, too small for PCI. Nuclear stress testing last month did not show any evidence of reversible ischemia. She has a history of a bare-metal stent in the right coronary artery in 2009.  COPD (chronic obstructive pulmonary disease)    Hyperlipidemia On statin therapy. It's time to repeat a lipid profile.  Permanent atrial fibrillation On therapeutic warfarin anticoagulation. Good rate control on current medications.   Orders Placed This Encounter  Procedures  . EKG 12-Lead   Meds ordered this encounter  Medications  . DISCONTD: albuterol (PROVENTIL HFA;VENTOLIN HFA) 108 (90 BASE) MCG/ACT inhaler    Sig: Inhale 2 puffs into the lungs every 6 (six) hours as needed for wheezing.    Dispense:  1 Inhaler    Refill:  2  . Rivaroxaban (XARELTO) 20 MG TABS    Sig: Take 1 tablet (20 mg total) by mouth daily.    Dispense:  90 tablet    Refill:  3  . ranolazine (RANEXA) 1000 MG SR tablet    Sig: Take 1 tablet (1,000 mg total) by mouth 2 (two) times daily.     Dispense:  180 tablet    Refill:  3  . albuterol (PROVENTIL HFA;VENTOLIN HFA) 108 (90 BASE) MCG/ACT inhaler    Sig: Inhale 2 puffs into the lungs every 6 (six) hours as needed for wheezing.    Dispense:  1 Inhaler    Refill:  2    Lai Hendriks  Thurmon Fair, MD, [facility] [phone] office [phone] pager

## 2012-12-14 NOTE — Assessment & Plan Note (Addendum)
The Ranexa seems to have helped her exertional angina. She is to take the target 1000 mg twice a day dose. She seems to tolerate without side effects. She is also on calcium channel blockers and long-acting nitrates. We are trying to avoid beta blockers because of wheezing and a long-standing history of smoking in the past. She has known high-grade stenosis that is not amenable to revascularization in the distal portion of the AV groove branch of the left circumflex coronary artery, too small for PCI. Nuclear stress testing last month did not show any evidence of reversible ischemia. She has a history of a bare-metal stent in the right coronary artery in 2009.

## 2012-12-14 NOTE — Assessment & Plan Note (Signed)
On therapeutic warfarin anticoagulation. Good rate control on current medications.

## 2012-12-17 ENCOUNTER — Telehealth: Payer: Self-pay | Admitting: Cardiovascular Disease

## 2012-12-17 DIAGNOSIS — I2510 Atherosclerotic heart disease of native coronary artery without angina pectoris: Secondary | ICD-10-CM

## 2012-12-17 DIAGNOSIS — R079 Chest pain, unspecified: Secondary | ICD-10-CM

## 2012-12-17 DIAGNOSIS — R0602 Shortness of breath: Secondary | ICD-10-CM

## 2012-12-17 MED ORDER — RANOLAZINE ER 1000 MG PO TB12: 1000.0000 mg | ORAL_TABLET | Freq: Two times a day (BID) | ORAL | Status: DC

## 2012-12-17 MED ORDER — RIVAROXABAN 20 MG PO TABS: 20.0000 mg | ORAL_TABLET | Freq: Every day | ORAL | Status: DC

## 2012-12-17 NOTE — Telephone Encounter (Signed)
Spoke to son. He states that his mom may not be taking medication  On a daily basis ,due to cost . Informed him of what Dr C stated about [facility]  Informed son that they could pick up samples next week of meds  Tiffany Charles

## 2012-12-17 NOTE — Telephone Encounter (Signed)
I am re-routing this caoo to [facility].

## 2012-12-17 NOTE — Telephone Encounter (Signed)
Unfortunately, there is no generic replacement for Ranexa (or any other brand name drug for that matter).  I suggest that she take the samples to see if it helps. If it is helpful for her chest symptoms, we can try to get assistance from the manufacturer.

## 2012-12-17 NOTE — Telephone Encounter (Signed)
Tiffany Charles states that one of the new heart medications that Dr. Salena Saner gave her is too expensive--over 800.00---please call

## 2012-12-17 NOTE — Telephone Encounter (Signed)
Spoke with patient at 4:54 pm---she will pick up samples and coupon on Monday 12/20/12.

## 2012-12-17 NOTE — Telephone Encounter (Signed)
Pt called and wanted Dr C to know that it will cost her $800 for both Ranexa and Xarelto  Through mail order. She state she could probably continue with Xarelto from local it cost $42 She wants to know what to do. Will defer to  Dr Salena Saner

## 2012-12-28 ENCOUNTER — Observation Stay (HOSPITAL_COMMUNITY)
Admission: EM | Admit: 2012-12-28 | Discharge: 2012-12-31 | Disposition: A | Discharge status: Home / Self Care | Payer: No Typology Code available for payment source | Attending: Internal Medicine | Admitting: Internal Medicine

## 2012-12-28 ENCOUNTER — Encounter (HOSPITAL_COMMUNITY): Payer: Self-pay | Admitting: *Deleted

## 2012-12-28 ENCOUNTER — Emergency Department (HOSPITAL_COMMUNITY): Payer: No Typology Code available for payment source

## 2012-12-28 DIAGNOSIS — I208 Other forms of angina pectoris: Secondary | ICD-10-CM

## 2012-12-28 DIAGNOSIS — I2510 Atherosclerotic heart disease of native coronary artery without angina pectoris: Secondary | ICD-10-CM

## 2012-12-28 DIAGNOSIS — Z7901 Long term (current) use of anticoagulants: Secondary | ICD-10-CM

## 2012-12-28 DIAGNOSIS — Z79899 Other long term (current) drug therapy: Secondary | ICD-10-CM | POA: Insufficient documentation

## 2012-12-28 DIAGNOSIS — J449 Chronic obstructive pulmonary disease, unspecified: Secondary | ICD-10-CM | POA: Diagnosis present

## 2012-12-28 DIAGNOSIS — I4821 Permanent atrial fibrillation: Secondary | ICD-10-CM

## 2012-12-28 DIAGNOSIS — J4489 Other specified chronic obstructive pulmonary disease: Secondary | ICD-10-CM | POA: Insufficient documentation

## 2012-12-28 DIAGNOSIS — R55 Syncope and collapse: Principal | ICD-10-CM

## 2012-12-28 DIAGNOSIS — I209 Angina pectoris, unspecified: Secondary | ICD-10-CM | POA: Insufficient documentation

## 2012-12-28 DIAGNOSIS — I4891 Unspecified atrial fibrillation: Secondary | ICD-10-CM | POA: Insufficient documentation

## 2012-12-28 DIAGNOSIS — E785 Hyperlipidemia, unspecified: Secondary | ICD-10-CM

## 2012-12-28 DIAGNOSIS — Z720 Tobacco use: Secondary | ICD-10-CM

## 2012-12-28 DIAGNOSIS — I959 Hypotension, unspecified: Secondary | ICD-10-CM

## 2012-12-28 DIAGNOSIS — I2089 Other forms of angina pectoris: Secondary | ICD-10-CM

## 2012-12-28 DIAGNOSIS — R0789 Other chest pain: Secondary | ICD-10-CM | POA: Insufficient documentation

## 2012-12-28 HISTORY — DX: Chronic obstructive pulmonary disease, unspecified: J44.9

## 2012-12-28 HISTORY — DX: Permanent atrial fibrillation: I48.21

## 2012-12-28 HISTORY — DX: Long term (current) use of anticoagulants: Z79.01

## 2012-12-28 LAB — URINALYSIS, ROUTINE W REFLEX MICROSCOPIC
Bilirubin Urine: NEGATIVE
Ketones, ur: NEGATIVE mg/dL
Nitrite: NEGATIVE
Urobilinogen, UA: 0.2 mg/dL (ref 0.0–1.0)
pH: 6 (ref 5.0–8.0)

## 2012-12-28 LAB — POCT I-STAT TROPONIN I: Troponin i, poc: 0.02 ng/mL (ref 0.00–0.08)

## 2012-12-28 LAB — CBC
MCV: 95.3 fL (ref 78.0–100.0)
Platelets: 246 10*3/uL (ref 150–400)
RBC: 3.85 MIL/uL — ABNORMAL LOW (ref 3.87–5.11)
RDW: 14 % (ref 11.5–15.5)
WBC: 9.5 10*3/uL (ref 4.0–10.5)

## 2012-12-28 LAB — BASIC METABOLIC PANEL
CO2: 27 mEq/L (ref 19–32)
Calcium: 9.2 mg/dL (ref 8.4–10.5)
Chloride: 103 mEq/L (ref 96–112)
Creatinine, Ser: 0.98 mg/dL (ref 0.50–1.10)
GFR calc Af Amer: 61 mL/min — ABNORMAL LOW (ref >90)
Sodium: 137 mEq/L (ref 135–145)

## 2012-12-28 LAB — URINE MICROSCOPIC-ADD ON

## 2012-12-28 MED ORDER — ONDANSETRON HCL 4 MG/2ML IJ SOLN: 4.0000 mg | Freq: Four times a day (QID) | INTRAMUSCULAR | Status: DC | PRN

## 2012-12-28 MED ORDER — ASPIRIN EC 81 MG PO TBEC
81.0000 mg | DELAYED_RELEASE_TABLET | Freq: Every day | ORAL | Status: DC
  Administered 2012-12-29 – 2012-12-31 (×3): 81 mg via ORAL
  Filled 2012-12-28 (×3): qty 1

## 2012-12-28 MED ORDER — ONDANSETRON HCL 4 MG PO TABS: 4.0000 mg | ORAL_TABLET | Freq: Four times a day (QID) | ORAL | Status: DC | PRN

## 2012-12-28 MED ORDER — ISOSORBIDE MONONITRATE ER 30 MG PO TB24
30.0000 mg | ORAL_TABLET | Freq: Every day | ORAL | Status: DC
  Administered 2012-12-29 – 2012-12-31 (×3): 30 mg via ORAL
  Filled 2012-12-28 (×3): qty 1

## 2012-12-28 MED ORDER — SODIUM CHLORIDE 0.9 % IJ SOLN
3.0000 mL | Freq: Two times a day (BID) | INTRAMUSCULAR | Status: DC
  Administered 2012-12-29 – 2012-12-30 (×3): 3 mL via INTRAVENOUS

## 2012-12-28 MED ORDER — PRAVASTATIN SODIUM 40 MG PO TABS
80.0000 mg | ORAL_TABLET | Freq: Every day | ORAL | Status: DC
  Administered 2012-12-29 – 2012-12-31 (×3): 80 mg via ORAL
  Filled 2012-12-28 (×3): qty 2

## 2012-12-28 MED ORDER — DILTIAZEM HCL ER COATED BEADS 240 MG PO CP24
240.0000 mg | ORAL_CAPSULE | Freq: Every day | ORAL | Status: DC
  Administered 2012-12-29: 240 mg via ORAL
  Filled 2012-12-28: qty 1

## 2012-12-28 MED ORDER — CITALOPRAM HYDROBROMIDE 20 MG PO TABS
20.0000 mg | ORAL_TABLET | Freq: Every day | ORAL | Status: DC
  Administered 2012-12-29 – 2012-12-31 (×3): 20 mg via ORAL
  Filled 2012-12-28 (×3): qty 1

## 2012-12-28 MED ORDER — ALBUTEROL SULFATE HFA 108 (90 BASE) MCG/ACT IN AERS
2.0000 | INHALATION_SPRAY | Freq: Four times a day (QID) | RESPIRATORY_TRACT | Status: DC | PRN
  Filled 2012-12-28: qty 6.7

## 2012-12-28 MED ORDER — SODIUM CHLORIDE 0.9 % IJ SOLN: 3.0000 mL | INTRAMUSCULAR | Status: DC | PRN

## 2012-12-28 MED ORDER — RANOLAZINE ER 500 MG PO TB12
1000.0000 mg | ORAL_TABLET | Freq: Two times a day (BID) | ORAL | Status: DC
  Administered 2012-12-28 – 2012-12-31 (×6): 1000 mg via ORAL
  Filled 2012-12-28 (×7): qty 2

## 2012-12-28 MED ORDER — RIVAROXABAN 20 MG PO TABS
20.0000 mg | ORAL_TABLET | Freq: Every day | ORAL | Status: DC
  Administered 2012-12-29: 20 mg via ORAL
  Filled 2012-12-28 (×2): qty 1

## 2012-12-28 MED ORDER — SIMVASTATIN 10 MG PO TABS: 10.0000 mg | ORAL_TABLET | Freq: Every day | ORAL | Status: DC

## 2012-12-28 MED ORDER — SODIUM CHLORIDE 0.9 % IJ SOLN
3.0000 mL | Freq: Two times a day (BID) | INTRAMUSCULAR | Status: DC
  Administered 2012-12-28: 3 mL via INTRAVENOUS

## 2012-12-28 MED ORDER — DOCUSATE SODIUM 100 MG PO CAPS
100.0000 mg | ORAL_CAPSULE | Freq: Two times a day (BID) | ORAL | Status: DC
  Administered 2012-12-29 – 2012-12-31 (×5): 100 mg via ORAL
  Filled 2012-12-28 (×6): qty 1

## 2012-12-28 MED ORDER — DIGOXIN 125 MCG PO TABS
125.0000 ug | ORAL_TABLET | Freq: Every morning | ORAL | Status: DC
Start: 2012-12-29 — End: 2012-12-29
  Administered 2012-12-29: 125 ug via ORAL
  Filled 2012-12-28: qty 1

## 2012-12-28 MED ORDER — SODIUM CHLORIDE 0.9 % IV SOLN: 250.0000 mL | INTRAVENOUS | Status: DC | PRN

## 2012-12-28 NOTE — ED Notes (Signed)
Patient aware that we need urine specimen. Unable to void at this time. Pt will inform staff when she is able to provide sample. Will place [facility] at bedside

## 2012-12-28 NOTE — ED Notes (Signed)
Dr. Conley Rolls (hospitalist) at bedside

## 2012-12-28 NOTE — ED Notes (Signed)
Attempted to give floor report. Receiving RN unavailable. No buddy RN or charge RN able to receive report.

## 2012-12-28 NOTE — ED Notes (Signed)
Pt states had sudden onset of sweating, felt like going to pass out, and then vomited over self.  Pt feels weak all over.  PT complains of chest tightness

## 2012-12-28 NOTE — ED Provider Notes (Signed)
CSN: [id number]     Arrival date & time 12/28/12  1833 History     First MD Initiated Contact with Patient 12/28/12 1929     Chief Complaint  Patient presents with  . Chest Pain  . Near Syncope   (Consider location/radiation/quality/duration/timing/severity/associated sxs/prior Treatment) HPI Comments: 79 y.o. F with a PMH of CAD s/p PCI, Afib on xarelto presenting with sudden onset of diaphoresis, nausea with vomiting x 1, generalized weakness, and pre-syncopal symptoms.  Pt has reportedly had less energy for the last 1-2 weeks, with acute worsening over the past 2 days.  Yesterday pt rested in bed all day.  Pt reports pre-syncopal episode today occurred just prior to presentation.  Episode lasted about 15 mins, she denies LOC but had to sit down.  She now reports feeling generalized weakness.  She reports chest tightness but denies dyspnea.  Pt denies recent fevers.  No HA, focal weakness, or numbness.  Patient is a 79 y.o. female presenting with chest pain. The history is provided by the patient.  Chest Pain Pain location:  Substernal area Pain quality: tightness   Pain radiates to:  Does not radiate Pain radiates to the back: no   Pain severity:  Mild Onset quality:  Gradual Duration:  1 hour Timing:  Constant Progression:  Unchanged Chronicity:  New Context: at rest   Relieved by:  None tried Worsened by:  Nothing tried Ineffective treatments:  None tried Associated symptoms: diaphoresis, dizziness, fatigue, nausea, vomiting and weakness   Associated symptoms: no abdominal pain, no altered mental status, no back pain, no claudication, no cough, no fever, no headache, no lower extremity edema, no numbness, no palpitations and no shortness of breath   Fatigue:    Severity:  Moderate   Duration:  2 weeks   Timing:  Constant   Progression:  Unchanged Nausea:    Severity:  Moderate   Onset quality:  Gradual   Duration:  1 hour   Timing:  Constant   Progression:   Unchanged Vomiting:    Quality:  Stomach contents   Number of occurrences:  1   Severity:  Mild   Duration:  1 hour   Progression:  Resolved Weakness:    Severity:  Moderate   Onset quality:  Gradual   Duration:  2 weeks   Chronicity:  New   Timing:  Constant Risk factors: coronary artery disease   Risk factors comment:  Afib   Past Medical History  Diagnosis Date  . Atrial fibrillation     LEXISCAN, 07/23/2011 - no evidence of inducible myocardial ischemia  . Chronic ischemic heart disease, unspecified   . Angina pectoris   . Hyperlipidemia   . Myocardial infarction   . Arthritis   . Coronary artery disease   . Stented coronary artery   . Skin cancer   . Cardiomegaly     2D ECHO, 02/04/2007 - EF >55%, moderate mitral and tricuspid regurgitation   Past Surgical History  Procedure Laterality Date  . Abdominal hysterectomy    . Hip surgery    . Coronary angioplasty with stent placement    .  cataracts removed    . Melanoma excision  06/06/2011    Procedure: MELANOMA EXCISION;  Surgeon: Ernestene Mention, MD;  Location: [facility];  Service: General;  Laterality: Left;  re excision squamous cell lesion left chest wall   . Cardioversion  06/05/2009    3 attempts, last attempt successful  . Cardiac catheterization  03/22/2009  Started medical therapy  . Cardiac catheterization  06/08/2007    RCA, 3.5x37mm Vision stent deployed at 16 atomspheres  . Cardiac catheterization  03/12/2007   Family History  Problem Relation Age of Onset  . Stroke Mother   . Coronary artery disease Father   . Stroke Sister   . Heart disease Brother   . Stroke Sister   . Stroke Sister    History  Substance Use Topics  . Smoking status: Former Smoker    Quit date: 05/28/2010  . Smokeless tobacco: Never Used  . Alcohol Use: No   OB History   Grav Para Term Preterm Abortions TAB SAB Ect Mult Living                 Review of Systems  Constitutional: Positive for diaphoresis, activity  change and fatigue. Negative for fever, chills and appetite change.  Eyes: Negative for photophobia and visual disturbance.  Respiratory: Negative for cough, chest tightness, shortness of breath and wheezing.   Cardiovascular: Positive for chest pain. Negative for palpitations and claudication.  Gastrointestinal: Positive for nausea and vomiting. Negative for abdominal pain, diarrhea, blood in stool and abdominal distention.  Genitourinary: Negative for dysuria, urgency, frequency, flank pain, decreased urine volume and difficulty urinating.  Musculoskeletal: Negative for myalgias, back pain, joint swelling, arthralgias and gait problem.  Skin: Negative for color change, pallor and rash.  Neurological: Positive for dizziness, weakness and light-headedness. Negative for syncope, facial asymmetry, speech difficulty, numbness and headaches.  Psychiatric/Behavioral: Negative for altered mental status.  All other systems reviewed and are negative.    Allergies  Codeine  Home Medications   No current outpatient prescriptions on file. BP 134/54  Pulse 71  Temp(Src) 97.3 F (36.3 C) (Oral)  Resp 19  Ht 5' 7.5" (1.715 m)  Wt 128 lb 11.2 oz (58.378 kg)  BMI 19.85 kg/m2  SpO2 97% Physical Exam  Nursing note and vitals reviewed. Constitutional: She is oriented to person, place, and time. She appears well-developed and well-nourished. No distress.  HENT:  Head: Normocephalic and atraumatic.  Eyes: EOM are normal. Pupils are equal, round, and reactive to light.  Neck: Normal range of motion. Neck supple.  Cardiovascular: Normal rate, normal heart sounds and intact distal pulses.   Pulmonary/Chest: Effort normal and breath sounds normal. No respiratory distress. She has no wheezes. She has no rales.  Abdominal: Soft. Normal appearance and bowel sounds are normal. She exhibits no distension and no mass. There is tenderness in the suprapubic area. There is no rigidity, no rebound, no guarding  and no CVA tenderness.  Musculoskeletal: Normal range of motion. She exhibits no edema and no tenderness.  Neurological: She is alert and oriented to person, place, and time. She has normal strength. No cranial nerve deficit or sensory deficit. She exhibits normal muscle tone. She displays a negative Romberg sign. Coordination normal. GCS eye subscore is 4. GCS verbal subscore is 5. GCS motor subscore is 6.  Skin: Skin is warm and dry. No rash noted. She is not diaphoretic. No erythema. No pallor.    ED Course   Procedures (including critical care time)  Labs Reviewed  CBC - Abnormal; Notable for the following:    RBC 3.85 (*)    All other components within normal limits  BASIC METABOLIC PANEL - Abnormal; Notable for the following:    Glucose, Bld 161 (*)    GFR calc non Af Amer 53 (*)    GFR calc Af Amer 61 (*)  All other components within normal limits  URINALYSIS, ROUTINE W REFLEX MICROSCOPIC - Abnormal; Notable for the following:    APPearance CLOUDY (*)    Leukocytes, UA SMALL (*)    All other components within normal limits  DIGOXIN LEVEL - Abnormal; Notable for the following:    Digoxin Level 0.4 (*)    All other components within normal limits  URINE MICROSCOPIC-ADD ON  TSH  TROPONIN I  TROPONIN I  TROPONIN I  POCT I-STAT TROPONIN I   Dg Chest 2 View  12/28/2012   *RADIOLOGY REPORT*  Clinical Data: Chest pain  CHEST - 2 VIEW  Comparison: January 23, 2010  Findings: There is no focal infiltrate, pulmonary edema, or pleural effusion.  The aorta is tortuous.  Heart size mildly enlarged.  The soft tissues and osseous structures are stable.  IMPRESSION: No acute cardiopulmonary disease identified.   Original Report Authenticated By: Sherian Rein, M.D.   1. Pre-syncope   2. CAD (coronary artery disease)   3. Chronic anticoagulation   4. Hyperlipidemia     MDM  79 y.o. F with a PMH of CAD s/p PCI and Afib on xarelto presenting s/p pre-syncopal episode and with generalized  weakness for the past 1-2 weeks, acutely worse over the past 2 days.  Pre-syncopal episode today with associated diaphoresis, chest tightness, and nausea with vomiting x 1.  No LOC.    On presentation, pt AFVSS.  Physical exam notable for mild suprapubic TTP, otherwise WNL.  Pt high risk for ACS.  EKG shows Afib with HR of 70, no acute ST-T wave abnormalities, no signficant change from prior study. CBC, BMP, troponin, CXR assessed.  Pt takes digoxin, digoxin level checked. Will also assess U/A given generalized weakness and suprapubic TTP.  Troponin negative. Labs WNL.  Digoxin level low at 0.4.  No evidence of UTI on U/A.  Pt admitted to medicine service for further cardiac workup, stable at time of admission.  Discussed with attending Dr. Denton Lank.  Jodean Lima, MD 12/28/12 [phone]

## 2012-12-29 ENCOUNTER — Encounter (HOSPITAL_COMMUNITY): Payer: Self-pay | Admitting: Cardiology

## 2012-12-29 DIAGNOSIS — I2510 Atherosclerotic heart disease of native coronary artery without angina pectoris: Secondary | ICD-10-CM

## 2012-12-29 DIAGNOSIS — J449 Chronic obstructive pulmonary disease, unspecified: Secondary | ICD-10-CM

## 2012-12-29 DIAGNOSIS — R55 Syncope and collapse: Secondary | ICD-10-CM

## 2012-12-29 DIAGNOSIS — I369 Nonrheumatic tricuspid valve disorder, unspecified: Secondary | ICD-10-CM

## 2012-12-29 MED ORDER — DILTIAZEM HCL ER COATED BEADS 180 MG PO CP24
180.0000 mg | ORAL_CAPSULE | Freq: Every day | ORAL | Status: DC
  Administered 2012-12-30: 180 mg via ORAL
  Filled 2012-12-29: qty 1

## 2012-12-29 NOTE — Progress Notes (Addendum)
[facility] PROGRESS NOTE  Tiffany Charles JYN:[id number] DOB: 09/26/1929 DOA: 12/28/2012 PCP: Aida Puffer, MD  Assessment/Plan: . Pre-syncope -still hypotensive this am, will decrease cardizem to 180mg  daily, and hold SBP <100 -cardiac enzymes so far neg -await echo  . COPD (chronic obstructive pulmonary disease) -stable . Hyperlipidemia . Permanent atrial fibrillation -on dig, cardizem dose decreased as above -continue xarelto . CAD (coronary artery disease)   Code Status: Full Family Communication:none at bedside Disposition Plan: to home when medically ready   Consultants:  NONE  Procedures:  Echo PENDING  Antibiotics:  None  HPI/Subjective: C/o gen achiness but denies cp and no SOB  Objective: Filed Vitals:   12/29/12 0525  BP: 90/58  Pulse:   Temp:   Resp:    No intake or output data in the 24 hours ending 12/29/12 0931 Filed Weights   12/28/12 2335  Weight: 58.378 kg (128 lb 11.2 oz)   Exam:  General: alert & oriented x 3 In NAD Cardiovascular:irreg, irreg, nl S1 s2 Respiratory: CTAB Abdomen: soft +BS NT/ND, no masses palpable Extremities: No cyanosis and no edema    Data Reviewed: Basic Metabolic Panel:  Recent Labs Lab 12/28/12 1852  NA 137  K 4.5  CL 103  CO2 27  GLUCOSE 161*  BUN 18  CREATININE 0.98  CALCIUM 9.2   Liver Function Tests: No results found for this basename: AST, ALT, ALKPHOS, BILITOT, PROT, ALBUMIN,  in the last 168 hours No results found for this basename: LIPASE, AMYLASE,  in the last 168 hours No results found for this basename: AMMONIA,  in the last 168 hours CBC:  Recent Labs Lab 12/28/12 1852  WBC 9.5  HGB 12.8  HCT 36.7  MCV 95.3  PLT 246   Cardiac Enzymes:  Recent Labs Lab 12/28/12 2341  TROPONINI <0.30   BNP (last 3 results) No results found for this basename: PROBNP,  in the last 8760 hours CBG: No results found for this basename: GLUCAP,  in the last 168 hours  No results  found for this or any previous visit (from the past 240 hour(s)).   Studies: Dg Chest 2 View  12/28/2012   *RADIOLOGY REPORT*  Clinical Data: Chest pain  CHEST - 2 VIEW  Comparison: January 23, 2010  Findings: There is no focal infiltrate, pulmonary edema, or pleural effusion.  The aorta is tortuous.  Heart size mildly enlarged.  The soft tissues and osseous structures are stable.  IMPRESSION: No acute cardiopulmonary disease identified.   Original Report Authenticated By: Sherian Rein, M.D.    Scheduled Meds: . aspirin EC  81 mg Oral Daily  . citalopram  20 mg Oral Daily  . digoxin  125 mcg Oral q morning - 10a  . diltiazem  240 mg Oral Daily  . docusate sodium  100 mg Oral BID  . isosorbide mononitrate  30 mg Oral Daily  . pravastatin  80 mg Oral q1800  . ranolazine  1,000 mg Oral BID  . Rivaroxaban  20 mg Oral Q supper  . sodium chloride  3 mL Intravenous Q12H  . sodium chloride  3 mL Intravenous Q12H   Continuous Infusions:   Principal Problem:   Pre-syncope Active Problems:   Permanent atrial fibrillation   COPD (chronic obstructive pulmonary disease)   CAD (coronary artery disease)   Hyperlipidemia   Chronic anticoagulation    Time spent: 35    Christinea Brizuela C  [facility] Pager [phone]. If 7PM-7AM, please contact night-coverage at [email or web address], password  [email or web address] 12/29/2012, 9:31 AM  LOS: 1 day

## 2012-12-29 NOTE — Progress Notes (Signed)
  Echocardiogram 2D Echocardiogram has been performed.  12/29/2012 4:05 PM Gertie Fey, RVT, RDCS, RDMS

## 2012-12-29 NOTE — H&P (Signed)
[facility] History and Physical  Tiffany Charles XBM:[id number] DOB: [birth year]    PCP:   Aida Puffer, MD   Chief Complaint: Lightheadedness and chest pressure.  HPI: Tiffany Charles is an 79 y.o. female with hx of afib on Xarelto, hyperlipidemia, CAD with prior MI, s/p melanoma excision, presents to the ER with lightheadedness and feeling pre-syncope.  She also admitted to having slight chest pressure.  She had her family taken her BP at home, and found that it was slightly low with SBP of 90.  No one took her HR.  She denied nausea, vomiting, but had slight diaphoresis.  She did not have any syncope.  Evaluation in the ER included an EKG with afib rate controlled, and SBP was 107.  Her serology was unremarkable, and her Dig level was 0.4.  Hospitalist was asked to admit her for presyncope and atypical chest pain r/out.  Rewiew of Systems:  Constitutional: Negative for malaise, fever and chills. No significant weight loss or weight gain Eyes: Negative for eye pain, redness and discharge, diplopia, visual changes, or flashes of light. ENMT: Negative for ear pain, hoarseness, nasal congestion, sinus pressure and sore throat. No headaches; tinnitus, drooling, or problem swallowing. Cardiovascular: Negative for palpitations, diaphoresis, dyspnea and peripheral edema. ; No orthopnea, PND Respiratory: Negative for cough, hemoptysis, wheezing and stridor. No pleuritic chestpain. Gastrointestinal: Negative for nausea, vomiting, diarrhea, constipation, abdominal pain, melena, blood in stool, hematemesis, jaundice and rectal bleeding.    Genitourinary: Negative for frequency, dysuria, incontinence,flank pain and hematuria; Musculoskeletal: Negative for back pain and neck pain. Negative for swelling and trauma.;  Skin: . Negative for pruritus, rash, abrasions, bruising and skin lesion.; ulcerations Neuro: Negative for headache,  and neck stiffness. Negative for weakness, altered level of  consciousness , altered mental status, extremity weakness, burning feet, involuntary movement, seizure and syncope.  Psych: negative for anxiety, depression, insomnia, tearfulness, panic attacks, hallucinations, paranoia, suicidal or homicidal ideation    Past Medical History  Diagnosis Date  . Atrial fibrillation     LEXISCAN, 07/23/2011 - no evidence of inducible myocardial ischemia  . Chronic ischemic heart disease, unspecified   . Angina pectoris   . Hyperlipidemia   . Myocardial infarction   . Arthritis   . Coronary artery disease   . Stented coronary artery   . Skin cancer   . Cardiomegaly     2D ECHO, 02/04/2007 - EF >55%, moderate mitral and tricuspid regurgitation    Past Surgical History  Procedure Laterality Date  . Abdominal hysterectomy    . Hip surgery    . Coronary angioplasty with stent placement    .  cataracts removed    . Melanoma excision  06/06/2011    Procedure: MELANOMA EXCISION;  Surgeon: Ernestene Mention, MD;  Location: [facility];  Service: General;  Laterality: Left;  re excision squamous cell lesion left chest wall   . Cardioversion  06/05/2009    3 attempts, last attempt successful  . Cardiac catheterization  03/22/2009    Started medical therapy  . Cardiac catheterization  06/08/2007    RCA, 3.5x32mm Vision stent deployed at 16 atomspheres  . Cardiac catheterization  03/12/2007    Medications:  HOME MEDS: Prior to Admission medications   Medication Sig Start Date End Date Taking? Authorizing Provider  albuterol (PROVENTIL HFA;VENTOLIN HFA) 108 (90 BASE) MCG/ACT inhaler Inhale 2 puffs into the lungs every 6 (six) hours as needed for wheezing.   Yes Historical Provider, MD  aspirin EC 81  MG tablet Take 81 mg by mouth daily.   Yes Historical Provider, MD  citalopram (CELEXA) 20 MG tablet Take 20 mg by mouth daily.   Yes Historical Provider, MD  digoxin (LANOXIN) 0.125 MG tablet Take 125 mcg by mouth every morning.    Yes Historical Provider, MD   diltiazem (CARTIA XT) 300 MG 24 hr capsule Take 300 mg by mouth daily.   Yes Historical Provider, MD  isosorbide mononitrate (IMDUR) 30 MG 24 hr tablet Take 30 mg by mouth daily.   Yes Historical Provider, MD  nitroGLYCERIN (NITROLINGUAL) 0.4 MG/SPRAY spray Place 1 spray under the tongue every 5 (five) minutes as needed for chest pain.   Yes Historical Provider, MD  pravastatin (PRAVACHOL) 80 MG tablet Take 80 mg by mouth at bedtime.    Yes Historical Provider, MD  ranolazine (RANEXA) 1000 MG SR tablet Take 1,000 mg by mouth 2 (two) times daily.   Yes Historical Provider, MD  Rivaroxaban (XARELTO) 20 MG TABS tablet Take 20 mg by mouth daily.   Yes Historical Provider, MD     Allergies:  Allergies  Allergen Reactions  . Codeine Nausea And Vomiting    Social History:   reports that she quit smoking about 2 years ago. She has never used smokeless tobacco. She reports that she does not drink alcohol or use illicit drugs.  Family History: Family History  Problem Relation Age of Onset  . Stroke Mother   . Coronary artery disease Father   . Stroke Sister   . Heart disease Brother   . Stroke Sister   . Stroke Sister      Physical Exam: Filed Vitals:   12/28/12 2200 12/28/12 2230 12/28/12 2245 12/28/12 2335  BP: 121/62 115/69 114/70 134/54  Pulse: 64 67 70 71  Temp:    97.3 F (36.3 C)  TempSrc:    Oral  Resp: 18 17 19    Height:    5' 7.5" (1.715 m)  Weight:    58.378 kg (128 lb 11.2 oz)  SpO2: 99% 97% 98% 97%   Blood pressure 134/54, pulse 71, temperature 97.3 F (36.3 C), temperature source Oral, resp. rate 19, height 5' 7.5" (1.715 m), weight 58.378 kg (128 lb 11.2 oz), SpO2 97.00%.  GEN:  Pleasant  patient lying in the stretcher in no acute distress; cooperative with exam. PSYCH:  alert and oriented x4; does not appear anxious or depressed; affect is appropriate. HEENT: Mucous membranes pink and anicteric; PERRLA; EOM intact; no cervical lymphadenopathy nor thyromegaly or  carotid bruit; no JVD; There were no stridor. Neck is very supple. Breasts:: Not examined CHEST WALL: No tenderness CHEST: Normal respiration, clear to auscultation bilaterally.  HEART: Regular rate and rhythm.  There are no murmur, rub, or gallops.   BACK: No kyphosis or scoliosis; no CVA tenderness ABDOMEN: soft and non-tender; no masses, no organomegaly, normal abdominal bowel sounds; no pannus; no intertriginous candida. There is no rebound and no distention. Rectal Exam: Not done EXTREMITIES: No bone or joint deformity; age-appropriate arthropathy of the hands and knees; no edema; no ulcerations.  There is no calf tenderness. Genitalia: not examined PULSES: 2+ and symmetric SKIN: Normal hydration no rash or ulceration CNS: Cranial nerves 2-12 grossly intact no focal lateralizing neurologic deficit.  Speech is fluent; uvula elevated with phonation, facial symmetry and tongue midline. DTR are normal bilaterally, cerebella exam is intact, barbinski is negative and strengths are equaled bilaterally.  No sensory loss.   Labs on Admission:  Basic  Metabolic Panel:  Recent Labs Lab 12/28/12 1852  NA 137  K 4.5  CL 103  CO2 27  GLUCOSE 161*  BUN 18  CREATININE 0.98  CALCIUM 9.2   Liver Function Tests: No results found for this basename: AST, ALT, ALKPHOS, BILITOT, PROT, ALBUMIN,  in the last 168 hours No results found for this basename: LIPASE, AMYLASE,  in the last 168 hours No results found for this basename: AMMONIA,  in the last 168 hours CBC:  Recent Labs Lab 12/28/12 1852  WBC 9.5  HGB 12.8  HCT 36.7  MCV 95.3  PLT 246   Cardiac Enzymes:  Recent Labs Lab 12/28/12 2341  TROPONINI <0.30    CBG: No results found for this basename: GLUCAP,  in the last 168 hours   Radiological Exams on Admission: Dg Chest 2 View  12/28/2012   *RADIOLOGY REPORT*  Clinical Data: Chest pain  CHEST - 2 VIEW  Comparison: January 23, 2010  Findings: There is no focal infiltrate,  pulmonary edema, or pleural effusion.  The aorta is tortuous.  Heart size mildly enlarged.  The soft tissues and osseous structures are stable.  IMPRESSION: No acute cardiopulmonary disease identified.   Original Report Authenticated By: Sherian Rein, M.D.    EKG: Independently reviewed. afib with controlled ventricular rate.  No ischemic changes.   Assessment/Plan Present on Admission:  . Pre-syncope . COPD (chronic obstructive pulmonary disease) . Hyperlipidemia . Permanent atrial fibrillation . CAD (coronary artery disease)  PLAN:  I was happy to admit her for further evaluation, but I told her there is a possibility of nothing could be found.  I was wondering if the reason for her lightheadedness was because of her cardiazem dose.  She was found to have slightly low HR, and borderline low BP at home.  Therefore, I will decrease her Cardiazem from 300mg  to 240mg  per day.  She has a low dig level, but it will be fine as it is.  It is certainly not toxic.  I have otherwise continued her home meds.  She hadn't had an ECHO since 2008, so along with cycling her troponin and adjusting her meds, I will obtain another cardiac ECHO.  She is stable, full code, and will be admitted to telemetry under [facility] service.  Thank you for allowing me to participate in the care of your nice patient.  Other plans as per orders.  Code Status: FULL Unk Lightning, MD. [facility] Pager [phone] 7pm to 7am.  12/29/2012, 3:52 AM

## 2012-12-29 NOTE — Progress Notes (Signed)
PTs BP 97/57 sitting on the side of the bed prior to walking. Standing up 82/40 prior to walking. Pulse went from 80 sitting to 60 standing. HR went up to almost 90 when standing up for a couple of minutes

## 2012-12-29 NOTE — Progress Notes (Signed)
Pts BP while sitting after walking 99/44 pt complained of her legs feeling "like rubber". Dr. Allyson Sabal notified. Will monitor.

## 2012-12-29 NOTE — Consult Note (Signed)
Reason for Consult: Near syncope  Requesting Physician: [facility]  HPI: This is a 79 y.o. female with a past medical history significant for CAD, s/p RCA BMS in 2009. Cath in 2010 showed a patent stent with high grade AV groove disease that has been treated medically. Myoview in Feb 2013 was low risk. MET test in June of this year suggested mild-moderate COPD. She was admitted 12/28/12 after a near syncopal spell at home. Tiffany Charles says she was standing in the kitchen when she became weak,broke out in diffuse sweat, had nausea and vomiting. She collapsed into a nearby recliner. She has never had a similar episode. She denies any chest pain. She admits she has felt "washed out" recently. Since admission her HR has been 55-75. B/P has been 100-110 systolic. She apparently was hypotensive on admission.  PMHx:  Past Medical History  Diagnosis Date  . Permanent atrial fibrillation   . Angina pectoris   . Hyperlipidemia   . Arthritis   . Coronary artery disease 2010    residual AV groove lesion- Medical Rx  . Stented coronary artery 2009    RCA stent  . Skin cancer Jan 2013    squamous cell- removed  . COPD (chronic obstructive pulmonary disease)   . Chronic anticoagulation     Xarelto   Past Surgical History  Procedure Laterality Date  . Abdominal hysterectomy    . Hip surgery    . Coronary angioplasty with stent placement  2009    RCA Vision stent  .  cataracts removed    . Melanoma excision  06/06/2011    Procedure: MELANOMA EXCISION;  Surgeon: Ernestene Mention, MD;  Location: [facility];  Service: General;  Laterality: Left;  re excision squamous cell lesion left chest wall   . Cardioversion  06/05/2009    3 attempts, last attempt successful  . Cardiac catheterization  03/22/2009    Started medical therapy    FAMHx: Family History  Problem Relation Age of Onset  . Stroke Mother   . Coronary artery disease Father   . Stroke Sister   . Heart disease Brother   . Stroke Sister   . Stroke  Sister     SOCHx:  reports that she quit smoking about 2 years ago. She has never used smokeless tobacco. She reports that she does not drink alcohol or use illicit drugs.  ALLERGIES: Allergies  Allergen Reactions  . Codeine Nausea And Vomiting    ROS: Pertinent items are noted in HPI. She denies fever, chills.No cough or dyspnea. No orthopnea or PND. No GI bleeding. No palpitations, no chest pain.No LE edema  HOME MEDICATIONS: Prescriptions prior to admission  Medication Sig Dispense Refill  . albuterol (PROVENTIL HFA;VENTOLIN HFA) 108 (90 BASE) MCG/ACT inhaler Inhale 2 puffs into the lungs every 6 (six) hours as needed for wheezing.      Marland Kitchen aspirin EC 81 MG tablet Take 81 mg by mouth daily.      . citalopram (CELEXA) 20 MG tablet Take 20 mg by mouth daily.      . digoxin (LANOXIN) 0.125 MG tablet Take 125 mcg by mouth every morning.       . diltiazem (CARTIA XT) 300 MG 24 hr capsule Take 300 mg by mouth daily.      . isosorbide mononitrate (IMDUR) 30 MG 24 hr tablet Take 30 mg by mouth daily.      . nitroGLYCERIN (NITROLINGUAL) 0.4 MG/SPRAY spray Place 1 spray under the tongue every 5 (  five) minutes as needed for chest pain.      . pravastatin (PRAVACHOL) 80 MG tablet Take 80 mg by mouth at bedtime.       . ranolazine (RANEXA) 1000 MG SR tablet Take 1,000 mg by mouth 2 (two) times daily.      . Rivaroxaban (XARELTO) 20 MG TABS tablet Take 20 mg by mouth daily.        HOSPITAL MEDICATIONS: I have reviewed the patient's current medications.  VITALS: Blood pressure 108/51, pulse 55, temperature 98.2 F (36.8 C), temperature source Oral, resp. rate 19, height 5' 7.5" (1.715 m), weight 128 lb 11.2 oz (58.378 kg), SpO2 99.00%.  PHYSICAL EXAM: General appearance: alert, cooperative, appears stated age and no distress Neck: no carotid bruit and no JVD Lungs: decreased bilat C/W COPD Heart: irregularly irregular rhythm Abdomen: soft, non-tender; bowel sounds normal; no masses,  no  organomegaly Extremities: extremities normal, atraumatic, no cyanosis or edema Pulses: 2+ and symmetric Skin: Skin color, texture, turgor normal. No rashes or lesions Neurologic: Grossly normal  LABS: Results for orders placed during the hospital encounter of 12/28/12 (from the past 48 hour(s))  CBC     Status: Abnormal   Collection Time    12/28/12  6:52 PM      Result Value Range   WBC 9.5  4.0 - 10.5 K/uL   RBC 3.85 (*) 3.87 - 5.11 MIL/uL   Hemoglobin 12.8  12.0 - 15.0 g/dL   HCT 16.1  09.6 - 04.5 %   MCV 95.3  78.0 - 100.0 fL   MCH 33.2  26.0 - 34.0 pg   MCHC 34.9  30.0 - 36.0 g/dL   RDW 40.9  81.1 - 91.4 %   Platelets 246  150 - 400 K/uL  BASIC METABOLIC PANEL     Status: Abnormal   Collection Time    12/28/12  6:52 PM      Result Value Range   Sodium 137  135 - 145 mEq/L   Potassium 4.5  3.5 - 5.1 mEq/L   Chloride 103  96 - 112 mEq/L   CO2 27  19 - 32 mEq/L   Glucose, Bld 161 (*) 70 - 99 mg/dL   BUN 18  6 - 23 mg/dL   Creatinine, Ser 7.82  0.50 - 1.10 mg/dL   Calcium 9.2  8.4 - 95.6 mg/dL   GFR calc non Af Amer 53 (*) >90 mL/min   GFR calc Af Amer 61 (*) >90 mL/min   Comment:            The eGFR has been calculated     using the CKD EPI equation.     This calculation has not been     validated in all clinical     situations.     eGFR's persistently     <90 mL/min signify     possible Chronic Kidney Disease.  POCT I-STAT TROPONIN I     Status: None   Collection Time    12/28/12  6:58 PM      Result Value Range   Troponin i, poc 0.02  0.00 - 0.08 ng/mL   Comment 3            Comment: Due to the release kinetics of cTnI,     a negative result within the first hours     of the onset of symptoms does not rule out     myocardial infarction with certainty.     If  myocardial infarction is still suspected,     repeat the test at appropriate intervals.  DIGOXIN LEVEL     Status: Abnormal   Collection Time    12/28/12  8:46 PM      Result Value Range   Digoxin  Level 0.4 (*) 0.8 - 2.0 ng/mL  URINALYSIS, ROUTINE W REFLEX MICROSCOPIC     Status: Abnormal   Collection Time    12/28/12  9:23 PM      Result Value Range   Color, Urine YELLOW  YELLOW   APPearance CLOUDY (*) CLEAR   Specific Gravity, Urine 1.017  1.005 - 1.030   pH 6.0  5.0 - 8.0   Glucose, UA NEGATIVE  NEGATIVE mg/dL   Hgb urine dipstick NEGATIVE  NEGATIVE   Bilirubin Urine NEGATIVE  NEGATIVE   Ketones, ur NEGATIVE  NEGATIVE mg/dL   Protein, ur NEGATIVE  NEGATIVE mg/dL   Urobilinogen, UA 0.2  0.0 - 1.0 mg/dL   Nitrite NEGATIVE  NEGATIVE   Leukocytes, UA SMALL (*) NEGATIVE  URINE MICROSCOPIC-ADD ON     Status: None   Collection Time    12/28/12  9:23 PM      Result Value Range   Squamous Epithelial / LPF RARE  RARE   WBC, UA 3-6  <3 WBC/hpf   RBC / HPF 0-2  <3 RBC/hpf   Bacteria, UA RARE  RARE   Urine-Other MUCOUS PRESENT    TSH     Status: None   Collection Time    12/28/12 11:41 PM      Result Value Range   TSH 0.826  0.350 - 4.500 uIU/mL   Comment: Performed at [facility]  TROPONIN I     Status: None   Collection Time    12/28/12 11:41 PM      Result Value Range   Troponin I <0.30  <0.30 ng/mL   Comment:            Due to the release kinetics of cTnI,     a negative result within the first hours     of the onset of symptoms does not rule out     myocardial infarction with certainty.     If myocardial infarction is still suspected,     repeat the test at appropriate intervals.    EKG: AF with CVR  IMAGING: Dg Chest 2 View  12/28/2012   *RADIOLOGY REPORT*  Clinical Data: Chest pain  CHEST - 2 VIEW  Comparison: January 23, 2010  Findings: There is no focal infiltrate, pulmonary edema, or pleural effusion.  The aorta is tortuous.  Heart size mildly enlarged.  The soft tissues and osseous structures are stable.  IMPRESSION: No acute cardiopulmonary disease identified.   Original Report Authenticated By: Sherian Rein, M.D.    IMPRESSION: Principal  Problem:   Pre-syncope Active Problems:   Permanent atrial fibrillation   COPD - PFTs in June 2014 with MET test   CAD, RCA BMS '09, 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 low risk Myoview Feb 2013   Hyperlipidemia   Chronic anticoagulation- Xarelto   RECOMMENDATION: MD to see. Agree with decreasing Diltiazem, this may have to be decreased further. The episode she described to me sounded like a bradycardic episode. Will stop Lanoxin. Ambulate and observe. She will need an event monitor after discharge. Echo ordered.  Time Spent Directly with Patient: 45  minutes  Abelino Derrick [phone] beeper 12/29/2012, 11:16 AM  Agree with note written by  Corine Shelter [facility]  Tiffany Charles admitted with near syncope. H/O CAD, CAF. On Xarelto AC. Denied CP. Exam benign. BP soft and mildly orthostatic Labs OK. Rhythm AF with slow VR. enz neg. Agree that could have been bradycardic. Decrease Cardizem, D/C dig (level .4 , therefore doubt toxicity). OP monitor. Observe today and D/C home tomorrow.   Runell Gess 12/29/2012 1:18 PM

## 2012-12-30 ENCOUNTER — Telehealth: Payer: Self-pay | Admitting: Cardiovascular Disease

## 2012-12-30 DIAGNOSIS — I959 Hypotension, unspecified: Secondary | ICD-10-CM

## 2012-12-30 DIAGNOSIS — I209 Angina pectoris, unspecified: Secondary | ICD-10-CM

## 2012-12-30 DIAGNOSIS — E785 Hyperlipidemia, unspecified: Secondary | ICD-10-CM

## 2012-12-30 DIAGNOSIS — I208 Other forms of angina pectoris: Secondary | ICD-10-CM | POA: Clinically undetermined

## 2012-12-30 DIAGNOSIS — I2089 Other forms of angina pectoris: Secondary | ICD-10-CM | POA: Clinically undetermined

## 2012-12-30 DIAGNOSIS — I4891 Unspecified atrial fibrillation: Secondary | ICD-10-CM

## 2012-12-30 LAB — BASIC METABOLIC PANEL
BUN: 14 mg/dL (ref 6–23)
Chloride: 104 mEq/L (ref 96–112)
Creatinine, Ser: 0.94 mg/dL (ref 0.50–1.10)
Glucose, Bld: 84 mg/dL (ref 70–99)
Potassium: 4 mEq/L (ref 3.5–5.1)

## 2012-12-30 MED ORDER — RIVAROXABAN 15 MG PO TABS
15.0000 mg | ORAL_TABLET | Freq: Every day | ORAL | Status: DC
  Administered 2012-12-30 – 2012-12-31 (×2): 15 mg via ORAL
  Filled 2012-12-30 (×2): qty 1

## 2012-12-30 MED ORDER — DILTIAZEM HCL ER 60 MG PO CP12
60.0000 mg | ORAL_CAPSULE | Freq: Two times a day (BID) | ORAL | Status: DC
  Administered 2012-12-30: 60 mg via ORAL
  Filled 2012-12-30 (×4): qty 1

## 2012-12-30 NOTE — ED Provider Notes (Signed)
I saw and evaluated the patient, reviewed the resident's note and I agree with the findings and plan. Pt w hx chronic afib, c/o generalized weakness, near syncope, and chest tightnesS. LABS, CXR.   Suzi Roots, MD 12/30/12 [phone]

## 2012-12-30 NOTE — Progress Notes (Addendum)
Subjective:  Up in room  Objective:  Vital Signs in the last 24 hours: Temp:  [97.6 F (36.4 C)-97.8 F (36.6 C)] 97.8 F (36.6 C) (08/07 1145) Pulse Rate:  [58-71] 70 (08/07 1145) Resp:  [18] 18 (08/07 1145) BP: (99-101)/(44-52) 101/52 mmHg (08/07 1145) SpO2:  [97 %-99 %] 99 % (08/07 1145)  Intake/Output from previous day: No intake or output data in the 24 hours ending 12/30/12 1440  Physical Exam: General appearance: alert, cooperative and no distress Lungs: clear to auscultation bilaterally Heart: irregularly irregular rhythm   Rate: 50s  Rhythm: atrial fibrillation  Lab Results:  Recent Labs  12/28/12 1852  WBC 9.5  HGB 12.8  PLT 246    Recent Labs  12/28/12 1852 12/30/12 0415  NA 137 137  K 4.5 4.0  CL 103 104  CO2 27 26  GLUCOSE 161* 84  BUN 18 14  CREATININE 0.98 0.94    Recent Labs  12/28/12 2341 12/29/12 1410  TROPONINI <0.30 <0.30   Imaging: Imaging results have been reviewed  Cardiac Studies:  Assessment/Plan:   Principal Problem:   Pre-syncope Active Problems:   Permanent atrial fibrillation   CAD, RCA BMS '09, 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 low risk Myoview Feb 2013   Atypical angina   Hyperlipidemia   COPD - PFTs in June 2014 with MET test   Chronic anticoagulation- Xarelto  PLAN: She was on Diltiazem 300 mg prior to admission, decreased to 240 mg yesterday (which she received) now on 180 mg but this will be held because of her HR this am. Hesitant to send her home on no rate control drugs but that's what it may come to. If her HR goes fast as an OP she will need a pacemaker.  Corine Shelter PA-C Beeper [phone] 12/30/2012, 2:40 PM  I have seen and evaluated the patient this PM along with Corine Shelter, PA. I agree with his findings, examination as well as impression recommendations.  I agree that I am reluctant to d/c off of rate control agent.  Her Dig level was Low -- would restart, b/c toxicity is unlikely.  I  think 180 or 120 mg of Diltiazem would be enough to keep her from going to rapid (changed to short acting BID dosing ~60 mg bid).  --> with these concerns, she will need OP CardioNet Monitor (MCOT) if possible.    She just had an episode as I was walking in -- BP was ~120s & HR was stable in high-mid 50s to 60s in Afib.  & she was lying down -- sx was in her upper chest to throat.  Since this doesn't would orthostatic or arrythmogenic - will need to consider an atypical presentation of Angina (she cannot recall why she had PCI in the past), she has a CAD history.  - Lexiscan Myoview tomorrow.  Keep until ST is done.  Change to Inpatient admission.   MD Time with pt: 10 min  HARDING,DAVID W, M.D., M.S. [facility] [address] [place], [place]  [address]  [phone] Pager # [phone] 12/30/2012 2:40 PM  ADDENDUM: Talked with Pharmacist - will reduce Xarelto dose to 15mg  due to age & renal fxn.  Thanks Marykay Lex, MD

## 2012-12-30 NOTE — Telephone Encounter (Signed)
Returned call to Tiffany Charles, pt's son.  Wanted to know if Dr. Salena Saner knows pt is in the hospital b/c he wants someone to go check on her.  Son informed pt has been seen today by Dr. Herbie Baltimore who works w/ Dr. Salena Saner.  Reassured son that MDs and Extenders from office are caring for pt and Dr. Salena Saner will be notified in case he was not aware that pt has been admitted.  Son stated pt just had an episode about an hour ago and wanted to have MD go look at pt.  Reassured son that nurses will page our providers if needed to see pt.  Verbalized understanding and stated he will be up there tomorrow to see pt as he had been sick himself, but is better now.  Message forwarded to Dr. Royann Shivers [facility]).

## 2012-12-30 NOTE — Progress Notes (Addendum)
[facility] PROGRESS NOTE  Tiffany Charles RUE:[id number] DOB: [birth year] DOA: 12/28/2012 PCP: Aida Puffer, MD  Assessment/Plan: . Pre-syncope -BP better,  Decreased cardizem dose of 180mg  daily started today with hold parameters -cardiac enzymes neg -echo with EF 55-65% and grade 3 diastolic dysfunction -per cards stress test in am . COPD (chronic obstructive pulmonary disease) -stable . Hyperlipidemia . Permanent atrial fibrillation -dig dc'ed per cards, cardizem dose decreased as above -continue xarelto . CAD (coronary artery disease) .diastolic dysfunction -no gross evidence of fluid overload, follow  Code Status: Full Family Communication:none at bedside Disposition Plan: to home when medically ready   Consultants:  NONE  Procedures:  Echo  Study Conclusions  - Left ventricle: The cavity size was normal. Wall thickness was normal. Systolic function was normal. The estimated ejection fraction was in the range of 55% to 65%. Wall motion was normal; there were no regional wall motion abnormalities. Doppler parameters are consistent with a reversible restrictive pattern, indicative of decreased left ventricular diastolic compliance and/or increased left atrial pressure (grade 3 diastolic dysfunction). - Aortic valve: Trileaflet; mildly thickened leaflets. Trivial regurgitation. - Mitral valve: Moderate regurgitation. - Left atrium: The atrium was severely dilated by volume assessment at 44.4 ml/m2 - Right ventricle: Systolic pressure was increased. - Right atrium: The atrium was severely dilated. - Tricuspid valve: Severe regurgitation. - Pulmonary arteries: PA peak pressure: 43mm Hg (S). Impressions:  - The right ventricular systolic pressure was increased consistent with moderate pulmonary hypertension  Antibiotics:  None  HPI/Subjective: States she feels better today, denies cp and no SOB  Objective: Filed Vitals:   12/30/12 1145  BP: 101/52   Pulse: 70  Temp: 97.8 F (36.6 C)  Resp: 18   No intake or output data in the 24 hours ending 12/30/12 1855 Filed Weights   12/28/12 2335  Weight: 58.378 kg (128 lb 11.2 oz)   Exam:  General: alert & oriented x 3 In NAD Cardiovascular:irreg, irreg, nl S1 s2 Respiratory: CTAB Abdomen: soft +BS NT/ND, no masses palpable Extremities: No cyanosis and no edema    Data Reviewed: Basic Metabolic Panel:  Recent Labs Lab 12/28/12 1852 12/30/12 0415  NA 137 137  K 4.5 4.0  CL 103 104  CO2 27 26  GLUCOSE 161* 84  BUN 18 14  CREATININE 0.98 0.94  CALCIUM 9.2 8.8   Liver Function Tests: No results found for this basename: AST, ALT, ALKPHOS, BILITOT, PROT, ALBUMIN,  in the last 168 hours No results found for this basename: LIPASE, AMYLASE,  in the last 168 hours No results found for this basename: AMMONIA,  in the last 168 hours CBC:  Recent Labs Lab 12/28/12 1852  WBC 9.5  HGB 12.8  HCT 36.7  MCV 95.3  PLT 246   Cardiac Enzymes:  Recent Labs Lab 12/28/12 2341 12/29/12 1410  TROPONINI <0.30 <0.30   BNP (last 3 results) No results found for this basename: PROBNP,  in the last 8760 hours CBG: No results found for this basename: GLUCAP,  in the last 168 hours  No results found for this or any previous visit (from the past 240 hour(s)).   Studies: Dg Chest 2 View  12/28/2012   *RADIOLOGY REPORT*  Clinical Data: Chest pain  CHEST - 2 VIEW  Comparison: January 23, 2010  Findings: There is no focal infiltrate, pulmonary edema, or pleural effusion.  The aorta is tortuous.  Heart size mildly enlarged.  The soft tissues and osseous structures are stable.  IMPRESSION: No  acute cardiopulmonary disease identified.   Original Report Authenticated By: Sherian Rein, M.D.    Scheduled Meds: . aspirin EC  81 mg Oral Daily  . citalopram  20 mg Oral Daily  . diltiazem  60 mg Oral Q12H  . docusate sodium  100 mg Oral BID  . isosorbide mononitrate  30 mg Oral Daily  .  pravastatin  80 mg Oral q1800  . ranolazine  1,000 mg Oral BID  . Rivaroxaban  15 mg Oral Q supper  . sodium chloride  3 mL Intravenous Q12H  . 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 low risk Myoview Feb 2013   Hyperlipidemia   Chronic anticoagulation- Xarelto   Atypical angina    Time spent: 13    [facility] C  [facility] Pager [phone]. If 7PM-7AM, please contact night-coverage at [email or web address], password [facility] 12/30/2012, 6:55 PM  LOS: 2 days

## 2012-12-30 NOTE — Progress Notes (Signed)
MEDICATION RELATED CONSULT NOTE - INITIAL   Pharmacy Recommendation Regarding Xarelto Indication: afib  Spoke with Dr Herbie Baltimore-- based on age, wt, Ms Lemelle's CrCl will remain 45-50 ml/min at best, necessitating a dosage adjustment of Xarelto to 15 mg po qday. Will enter new orders.  Suggest change home dose to 15 mg po qday upon d/c.  Jayme Cloud PharmD BCPS [phone] 12/30/2012 2:53 PM

## 2012-12-30 NOTE — Telephone Encounter (Signed)
[name] is calling regarding his mother's condition. He wanted to know if someone could call him back. Roe Coombs would like to know if Dr. Salena Saner has gone to see her if not can he go look at her.

## 2012-12-31 ENCOUNTER — Observation Stay (HOSPITAL_COMMUNITY): Payer: No Typology Code available for payment source

## 2012-12-31 ENCOUNTER — Encounter (HOSPITAL_COMMUNITY): Payer: No Typology Code available for payment source

## 2012-12-31 DIAGNOSIS — I209 Angina pectoris, unspecified: Secondary | ICD-10-CM

## 2012-12-31 DIAGNOSIS — I2510 Atherosclerotic heart disease of native coronary artery without angina pectoris: Secondary | ICD-10-CM

## 2012-12-31 DIAGNOSIS — F17200 Nicotine dependence, unspecified, uncomplicated: Secondary | ICD-10-CM

## 2012-12-31 MED ORDER — TECHNETIUM TC 99M SESTAMIBI GENERIC - CARDIOLITE
10.0000 | Freq: Once | INTRAVENOUS | Status: AC | PRN
  Administered 2012-12-31: 10 via INTRAVENOUS

## 2012-12-31 MED ORDER — DIGOXIN 125 MCG PO TABS
0.1250 mg | ORAL_TABLET | Freq: Every day | ORAL | Status: DC
  Filled 2012-12-31: qty 1

## 2012-12-31 MED ORDER — AMLODIPINE BESYLATE 5 MG PO TABS
5.0000 mg | ORAL_TABLET | Freq: Every day | ORAL | Status: DC
  Filled 2012-12-31: qty 1

## 2012-12-31 MED ORDER — REGADENOSON 0.4 MG/5ML IV SOLN
0.4000 mg | Freq: Once | INTRAVENOUS | Status: AC
  Administered 2012-12-31: 0.4 mg via INTRAVENOUS

## 2012-12-31 MED ORDER — AMLODIPINE BESYLATE 5 MG PO TABS: 5.0000 mg | ORAL_TABLET | Freq: Every day | ORAL | Status: DC

## 2012-12-31 MED ORDER — TECHNETIUM TC 99M SESTAMIBI GENERIC - CARDIOLITE
30.0000 | Freq: Once | INTRAVENOUS | Status: AC | PRN
  Administered 2012-12-31: 30 via INTRAVENOUS

## 2012-12-31 NOTE — Progress Notes (Addendum)
Subjective:  No chest pain  Objective:  Vital Signs in the last 24 hours: Temp:  [97.6 F (36.4 C)-98 F (36.7 C)] 97.9 F (36.6 C) (08/08 0400) Pulse Rate:  [49-70] 49 (08/08 0851) Resp:  [18] 18 (08/07 1145) BP: (99-115)/(40-65) 104/65 mmHg (08/08 0851) SpO2:  [95 %-99 %] 97 % (08/08 0400) Weight:  [129 lb 8 oz (58.741 kg)] 129 lb 8 oz (58.741 kg) (08/08 0400)  Intake/Output from previous day: No intake or output data in the 24 hours ending 12/31/12 0902  Physical Exam: General appearance: alert, cooperative and no distress Lungs: clear to auscultation bilaterally Heart: regular rate and rhythm   Rate: 56  Rhythm: atrial fibrillation  Lab Results:  Recent Labs  12/28/12 1852  WBC 9.5  HGB 12.8  PLT 246    Recent Labs  12/28/12 1852 12/30/12 0415  NA 137 137  K 4.5 4.0  CL 103 104  CO2 27 26  GLUCOSE 161* 84  BUN 18 14  CREATININE 0.98 0.94    Recent Labs  12/28/12 2341 12/29/12 1410  TROPONINI <0.30 <0.30   Hepatic Function Panel No results found for this basename: PROT, ALBUMIN, AST, ALT, ALKPHOS, BILITOT, BILIDIR, IBILI,  in the last 72 hours No results found for this basename: CHOL,  in the last 72 hours No results found for this basename: INR,  in the last 72 hours  Imaging: Imaging results have been reviewed  Cardiac Studies:  Assessment/Plan:   Principal Problem:   Pre-syncope Active Problems:   Permanent atrial fibrillation   COPD - PFTs in June 2014 with MET test   CAD, RCA BMS '09, 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 low risk Myoview Feb 2013   Hyperlipidemia   Chronic anticoagulation- Xarelto   Atypical angina    PLAN:  Episode yesterday witnessed by Dr Herbie Baltimore now appears to possibly be anginal. Myoview today.  Corine Shelter PA-C Beeper [phone] 12/31/2012, 9:02 AM  I have seen and examined the patient along with Corine Shelter. PA.  I have reviewed the chart, notes and new data.  I agree with PA's note.  Key new  complaints: no new events since this morning Key examination changes: no clinical evidence of CHF, no arrhythmia Key new findings / data: no reversible ischemia by nuclear study, unchanged from 2013  PLAN: Consider coronary vasospasm. Cannot increase diltiazem due to slow ventricular rate. Will switch to amlodipine and use low dose digoxin alone for rate control of AF. Continue nitrates. Most important treatment is probably immediate and complete smoking cessation, which was discussed at length with patient and family  Thurmon Fair, MD, [facility] [phone] 12/31/2012, 3:36 PM  Follow up in our office next week.

## 2012-12-31 NOTE — Discharge Summary (Signed)
Physician Discharge Summary  Tiffany Charles AVW:[id number] DOB: 1930/03/04 DOA: 12/28/2012  PCP: Aida Puffer, MD  Admit date: 12/28/2012 Discharge date: 12/31/2012  Time spent: <55minutes  Recommendations for Outpatient Follow-up:  Follow-up Information   Follow up with HAGER, BRYAN, PA-C. (Our office scheduler will call you with the appointment date and time.)    Contact information:   [address] [place] [address] [phone]       Follow up with Aida Puffer, MD. 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 low risk Myoview Feb 2013   Hyperlipidemia   Chronic anticoagulation- Xarelto   Atypical angina   Discharge Condition: improved/stable  Diet recommendation: heart healthy  Filed Weights   12/28/12 2335 12/31/12 0400  Weight: 58.378 kg (128 lb 11.2 oz) 58.741 kg (129 lb 8 oz)    History of present illness:  Pt is an 79 y.o. female with hx of afib on Xarelto, hyperlipidemia, CAD with prior MI, s/p melanoma excision, presents to the ER with lightheadedness and feeling pre-syncope. She also admitted to having slight chest pressure. She had her family taken her BP at home, and found that it was slightly low with SBP of 90. No one took her HR. She denied nausea, vomiting, but had slight diaphoresis. She did not have any syncope. Evaluation in the ER included an EKG with afib rate controlled, and SBP was 107. Her serology was unremarkable, and her Dig level was 0.4. Hospitalist was asked to admit her for presyncope and atypical chest pain r/out.   Hospital Course:  . Pre-syncope  -Pt was found to be hypotensive on admission and so her cardizem dose was decreased and  hold parameters added to meds -cardiology followed and changed her carzem to norvasc -cardiac enzymes were cycled and neg.  -echo with EF 55-65% and grade 3 diastolic dysfunction  - stress test was done as pt had also had CP and was neg as below . COPD (chronic obstructive pulmonary disease)  -stable . Hyperlipidemia . Permanent atrial fibrillation with bradycardic episodes  -initially cardiology dc'ed dig and were adjusting her cardizem dose decreased as above  -on follow up Dr Royann Shivers dc'ed cardizem and resumed the dig whichh pt was to continue on d/c and follow up with cards outpt  -she was maintained on  xarelto . CAD (coronary artery disease)  .diastolic dysfunction  -no gross evidence of fluid overload, follow up outpt with cards  Consultants:  NONE Procedures:  Echo  Study Conclusions  - Left ventricle: The cavity size was normal. Wall thickness was normal. Systolic function was normal. The estimated ejection fraction was in the range of 55% to 65%. Wall motion was normal; there were no regional wall motion abnormalities. Doppler parameters are consistent with a reversible restrictive pattern, indicative of decreased left ventricular diastolic compliance and/or increased left atrial pressure (grade 3 diastolic dysfunction). - Aortic valve: Trileaflet; mildly thickened leaflets. Trivial regurgitation. - Mitral valve: Moderate regurgitation. - Left atrium: The atrium was severely dilated by volume assessment at 44.4 ml/m2 - Right ventricle: Systolic pressure was increased. - Right atrium: The atrium was severely dilated. - Tricuspid valve: Severe regurgitation. -  Pulmonary arteries: PA peak pressure: 43mm Hg (S). Impressions:  - The right ventricular systolic pressure was increased consistent with moderate pulmonary hypertension  Stress test IMPRESSION:  1. No reversible ischemia.  2. Calculated ejection fraction is 73%.  3. Small distal anterior wall fixed  defect compatible with old  infarct.    Discharge Exam: Filed Vitals:   12/31/12 1607  BP: 101/50  Pulse: 66  Temp: 97.6 F (36.4 C)  Resp: 18     Discharge Instructions  Discharge Orders   Future Appointments Provider Department Dept Phone   01/07/2013 10:00 AM Wilburt Finlay, PA-C [facility] [place] [phone]   Future Orders Complete By Expires     Diet - low sodium heart healthy  As directed     Increase activity slowly  As directed         Medication List    STOP taking these medications       CARTIA XT 300 MG 24 hr capsule  Generic drug:  diltiazem      TAKE these medications       albuterol 108 (90 BASE) MCG/ACT inhaler  Commonly known as:  PROVENTIL HFA;VENTOLIN HFA  Inhale 2 puffs into the lungs every 6 (six) hours as needed for wheezing.     amLODipine 5 MG tablet  Commonly known as:  NORVASC  Take 1 tablet (5 mg total) by mouth daily.     aspirin EC 81 MG tablet  Take 81 mg by mouth daily.     citalopram 20 MG tablet  Commonly known as:  CELEXA  Take 20 mg by mouth daily.     digoxin 0.125 MG tablet  Commonly known as:  LANOXIN  Take 125 mcg by mouth every morning.     isosorbide mononitrate 30 MG 24 hr tablet  Commonly known as:  IMDUR  Take 30 mg by mouth daily.     nitroGLYCERIN 0.4 MG/SPRAY spray  Commonly known as:  NITROLINGUAL  Place 1 spray under the tongue every 5 (five) minutes as needed for chest pain.     pravastatin 80 MG tablet  Commonly known as:  PRAVACHOL  Take 80 mg by mouth at bedtime.     ranolazine 1000 MG SR tablet  Commonly known as:  RANEXA  Take 1,000 mg by mouth 2 (two) times daily.     Rivaroxaban 20 MG Tabs tablet  Commonly known as:  XARELTO  Take 20 mg by mouth daily.       Allergies  Allergen Reactions  . Codeine Nausea And Vomiting       Follow-up Information   Follow up with HAGER, BRYAN, PA-C. (Our office scheduler will call you with the appointment date and  time.)    Contact information:   [address] [place] [address] [phone]       Follow up with Aida Puffer, MD. (in 1week, call for appt upon discharge)    Contact information:   [address] [place] [address] [phone]        The results of significant diagnostics from this hospitalization (including imaging, microbiology, ancillary and laboratory) are listed below for reference.    Significant Diagnostic Studies: Dg Chest 2 View  12/28/2012   *RADIOLOGY REPORT*  Clinical Data: Chest pain  CHEST - 2 VIEW  Comparison: January 23, 2010  Findings: There is no focal infiltrate, pulmonary edema, or pleural effusion.  The aorta is tortuous.  Heart size mildly enlarged.  The soft tissues and osseous structures are stable.  IMPRESSION: No acute cardiopulmonary disease identified.   Original Report Authenticated By: Sherian Rein, M.D.   Nm Myocar Multi W/spect W/wall Motion / Ef  12/31/2012   *RADIOLOGY REPORT*  Clinical Data:  Atypical angina.  Known coronary artery disease.  MYOCARDIAL IMAGING WITH SPECT (REST AND PHARMACOLOGIC-STRESS) GATED LEFT VENTRICULAR WALL MOTION STUDY LEFT VENTRICULAR EJECTION FRACTION  Technique:  Standard myocardial SPECT imaging was performed after resting intravenous injection of 10 mCi Tc-60m sestamibi. Subsequently, intravenous infusion of Lexiscan was performed under the supervision of the Cardiology staff.  At peak effect of the drug, 30 mCi Tc-88m sestamibi was injected intravenously and standard myocardial SPECT imaging was performed.  Quantitative gated imaging was also performed to evaluate left ventricular wall motion and estimate left ventricular ejection fraction.  Comparison:  Chest radiograph 12/28/2012.  Findings:  There is a small fixed defect in the anterior wall distally.  No reversible perfusion defects are identified. Calculated ejection fraction is 73%. Wall motion appears normal. End-systolic volume 16 ml.  End-diastolic  volume is fifth at the 9 ml.  IMPRESSION:  1.  No reversible ischemia. 2.  Calculated ejection fraction is 73%. 3.  Small distal anterior wall fixed defect compatible with old infarct.   Original Report Authenticated By: Andreas Newport, M.D.    Microbiology: No results found for this or any previous visit (from the past 240 hour(s)).   Labs: Basic Metabolic Panel:  Recent Labs Lab 12/28/12 1852 12/30/12 0415  NA 137 137  K 4.5 4.0  CL 103 104  CO2 27 26  GLUCOSE 161* 84  BUN 18 14  CREATININE 0.98 0.94  CALCIUM 9.2 8.8   Liver Function Tests: No results found for this basename: AST, ALT, ALKPHOS, BILITOT, PROT, ALBUMIN,  in the last 168 hours No results found for this basename: LIPASE, AMYLASE,  in the last 168 hours No results found for this basename: AMMONIA,  in the last 168 hours CBC:  Recent Labs Lab 12/28/12 1852  WBC 9.5  HGB 12.8  HCT 36.7  MCV 95.3  PLT 246   Cardiac Enzymes:  Recent Labs Lab 12/28/12 2341 12/29/12 1410  TROPONINI <0.30 <0.30   BNP: BNP (last 3 results) No results found for this basename: PROBNP,  in the last 8760 hours CBG: No results found for this basename: GLUCAP,  in the last 168 hours     Signed:  Iman Reinertsen C  [facility] 12/31/2012, 5:15 PM

## 2013-01-01 ENCOUNTER — Other Ambulatory Visit: Payer: Self-pay | Admitting: Physician Assistant

## 2013-01-01 MED ORDER — AMLODIPINE BESYLATE 5 MG PO TABS: 5.0000 mg | ORAL_TABLET | Freq: Every day | ORAL | Status: DC

## 2013-01-01 NOTE — Progress Notes (Signed)
Patient called and Amlodipine prescription was not at [facility].  I resent script.  Therman Hughlett 2:58 PM

## 2013-01-03 ENCOUNTER — Telehealth: Payer: Self-pay | Admitting: Cardiovascular Disease

## 2013-01-03 MED ORDER — AMLODIPINE BESYLATE 5 MG PO TABS: 5.0000 mg | ORAL_TABLET | Freq: Every day | ORAL | Status: DC

## 2013-01-03 NOTE — Telephone Encounter (Signed)
Just released from the hospital Saturday-put her on a new medicine-been unable to get it.

## 2013-01-03 NOTE — Telephone Encounter (Signed)
Pt said that at hospital D/C the RX was not sent in.  The system showed that it was printed.  I sent the RX electronically

## 2013-01-07 ENCOUNTER — Encounter: Payer: Self-pay | Admitting: Physician Assistant

## 2013-01-07 ENCOUNTER — Ambulatory Visit (INDEPENDENT_AMBULATORY_CARE_PROVIDER_SITE_OTHER): Payer: No Typology Code available for payment source | Admitting: Physician Assistant

## 2013-01-07 VITALS — BP 128/64 | HR 86 | Ht 67.0 in | Wt 131.8 lb

## 2013-01-07 DIAGNOSIS — I4891 Unspecified atrial fibrillation: Secondary | ICD-10-CM

## 2013-01-07 DIAGNOSIS — I4821 Permanent atrial fibrillation: Secondary | ICD-10-CM

## 2013-01-07 DIAGNOSIS — Z7901 Long term (current) use of anticoagulants: Secondary | ICD-10-CM

## 2013-01-07 MED ORDER — RANOLAZINE ER 1000 MG PO TB12: 1000.0000 mg | ORAL_TABLET | Freq: Two times a day (BID) | ORAL | Status: DC

## 2013-01-07 NOTE — Progress Notes (Signed)
Date:  01/07/2013   ID:  Tiffany Charles, DOB [birth year], MRN [id number]  PCP:  Aida Puffer, MD  Primary Cardiologist:  Croitoru     History of Present Illness: Tiffany Charles is a 79 y.o. female with a past medical history significant for CAD, s/p RCA BMS in 2009. Cath in 2010 showed a patent stent with high grade AV groove disease that has been treated medically. Myoview in Feb 2013 was low risk. MET test in June of this year suggested mild-moderate COPD. She was admitted 12/28/12 after a near syncopal spell at home.  She admited she felt "washed out" recently. Since admission her HR has been 55-75. B/P has been 100-110 systolic. She apparently was hypotensive on admission.  During her admission diltiazem was discontinued she was continued on digoxin.  She had a 2-D echocardiogram which showed an ejection fraction of 55-65% and normal wall motion. She does have grade 3 diastolic dysfunction however. She is moderate mitral valve regurgitation. Left atrium was severely dilated. PA pressures 43 mmHg with severe tricuspid valve regurgitation.  She also underwent nuclear stress test which showed no reversible ischemia and an ejection fraction of 73%. There was a small distal anterior wall fixed defect compatible with old infarct.  The patient presents today in the followup to her hospitalization. She reports no further dizziness.  She also denies nausea, vomiting, fever, chest pain, shortness of breath, orthopnea, PND, cough, congestion, abdominal pain, hematochezia, melena, lower extremity edema, claudication.  Wt Readings from Last 3 Encounters:  01/07/13 131 lb 12.8 oz (59.784 kg)  12/31/12 129 lb 8 oz (58.741 kg)  12/08/12 135 lb (61.236 kg)     Past Medical History  Diagnosis Date  . Permanent atrial fibrillation   . Angina pectoris   . Hyperlipidemia   . Arthritis   . Coronary artery disease 2010    residual AV groove lesion- Medical Rx  . Stented coronary artery 2009    RCA stent    . Skin cancer Jan 2013    squamous cell- removed  . COPD (chronic obstructive pulmonary disease)   . Chronic anticoagulation     Xarelto    Current Outpatient Prescriptions  Medication Sig Dispense Refill  . albuterol (PROVENTIL HFA;VENTOLIN HFA) 108 (90 BASE) MCG/ACT inhaler Inhale 2 puffs into the lungs every 6 (six) hours as needed for wheezing.      Marland Kitchen amLODipine (NORVASC) 5 MG tablet Take 1 tablet (5 mg total) by mouth daily.  90 tablet  3  . aspirin EC 81 MG tablet Take 81 mg by mouth daily.      . citalopram (CELEXA) 20 MG tablet Take 20 mg by mouth daily.      . digoxin (LANOXIN) 0.125 MG tablet Take 125 mcg by mouth every morning.       . isosorbide mononitrate (IMDUR) 30 MG 24 hr tablet Take 30 mg by mouth daily.      . nitroGLYCERIN (NITROLINGUAL) 0.4 MG/SPRAY spray Place 1 spray under the tongue every 5 (five) minutes as needed for chest pain.      . pravastatin (PRAVACHOL) 80 MG tablet Take 80 mg by mouth at bedtime.       . ranolazine (RANEXA) 1000 MG SR tablet Take 1,000 mg by mouth 2 (two) times daily.      . Rivaroxaban (XARELTO) 20 MG TABS tablet Take 20 mg by mouth daily.       No current facility-administered medications for this visit.  Allergies:    Allergies  Allergen Reactions  . Codeine Nausea And Vomiting    Social History:  The patient  reports that she quit smoking about 2 years ago. She has never used smokeless tobacco. She reports that she does not drink alcohol or use illicit drugs.   Family history:   Family History  Problem Relation Age of Onset  . Stroke Mother   . Coronary artery disease Father   . Stroke Sister   . Heart disease Brother   . Stroke Sister   . Stroke Sister     ROS:  Please see the history of present illness.  All other systems reviewed and negative.   PHYSICAL EXAM: VS:  BP 128/64  Pulse 86  Ht 5\' 7"  (1.702 m)  Wt 131 lb 12.8 oz (59.784 kg)  BMI 20.64 kg/m2 Well nourished, well developed, in no acute  distress HEENT: Pupils are equal round react to light accommodation extraocular movements are intact.  Neck:No cervical lymphadenopathy. Cardiac: irregular rate and rhythm without murmurs rubs or gallops. Lungs:  clear to auscultation bilaterally, no wheezing, rhonchi or rales Ext: no lower extremity edema.  2+ radial and 1+ PT pulses. Skin: warm and dry Neuro:  Grossly normal  EKG:    Atrial fib.  Rate 86  ASSESSMENT AND PLAN:  Problem List Items Addressed This Visit   Permanent atrial fibrillation (Chronic)     HR is well controlled at this time.  Diltaizem was DCd in the hospital and Digoxin continued.  BP hsa also improved and the pt does not complain any further dizziness.    Chronic anticoagulation- Xarelto (Chronic)    Other Visit Diagnoses   Atrial fibrillation    -  Primary    Relevant Orders       EKG 12-Lead        Patient was provided with 2 weeks samples of Ranexa 1000 mg.

## 2013-01-07 NOTE — Addendum Note (Signed)
Addended by: Dwana Melena on: 01/07/2013 11:03 AM   Modules accepted: Orders

## 2013-01-07 NOTE — Patient Instructions (Signed)
Followup with Dr. Croitoru in three months. 

## 2013-01-07 NOTE — Assessment & Plan Note (Addendum)
HR is well controlled at this time.  Diltaizem was DCd in the hospital and Digoxin continued.  BP hsa also improved and the pt does not complain any further dizziness.

## 2013-02-02 ENCOUNTER — Other Ambulatory Visit: Payer: Self-pay | Admitting: *Deleted

## 2013-02-02 MED ORDER — CITALOPRAM HYDROBROMIDE 20 MG PO TABS: ORAL_TABLET | ORAL | Status: DC

## 2013-02-02 MED ORDER — PRAVASTATIN SODIUM 80 MG PO TABS: 80.0000 mg | ORAL_TABLET | Freq: Every day | ORAL | Status: DC

## 2013-02-02 NOTE — Telephone Encounter (Signed)
Rx was sent to pharmacy electronically. 

## 2013-03-07 ENCOUNTER — Other Ambulatory Visit: Payer: Self-pay | Admitting: *Deleted

## 2013-03-07 ENCOUNTER — Other Ambulatory Visit: Payer: Self-pay | Admitting: Pharmacist Clinician (PhC)/ Clinical Pharmacy Specialist

## 2013-03-07 MED ORDER — RIVAROXABAN 20 MG PO TABS: 20.0000 mg | ORAL_TABLET | Freq: Every day | ORAL | Status: DC

## 2013-03-07 MED ORDER — ISOSORBIDE MONONITRATE ER 30 MG PO TB24: 30.0000 mg | ORAL_TABLET | Freq: Every day | ORAL | Status: DC

## 2013-03-07 NOTE — Telephone Encounter (Signed)
Rx was sent to pharmacy electronically. 

## 2013-04-11 ENCOUNTER — Ambulatory Visit: Payer: No Typology Code available for payment source | Admitting: Cardiovascular Disease

## 2013-06-29 ENCOUNTER — Ambulatory Visit: Payer: No Typology Code available for payment source | Admitting: Cardiovascular Disease

## 2013-07-01 ENCOUNTER — Ambulatory Visit (INDEPENDENT_AMBULATORY_CARE_PROVIDER_SITE_OTHER): Payer: Medicare HMO | Admitting: Physician Assistant

## 2013-07-01 ENCOUNTER — Encounter: Payer: Self-pay | Admitting: Physician Assistant

## 2013-07-01 VITALS — BP 128/74 | HR 92 | Temp 97.0°F | Resp 18 | Ht 67.0 in | Wt 131.2 lb

## 2013-07-01 DIAGNOSIS — I2510 Atherosclerotic heart disease of native coronary artery without angina pectoris: Secondary | ICD-10-CM

## 2013-07-01 DIAGNOSIS — J449 Chronic obstructive pulmonary disease, unspecified: Secondary | ICD-10-CM

## 2013-07-01 DIAGNOSIS — I4891 Unspecified atrial fibrillation: Secondary | ICD-10-CM

## 2013-07-01 DIAGNOSIS — Z0000 Encounter for general adult medical examination without abnormal findings: Secondary | ICD-10-CM

## 2013-07-01 DIAGNOSIS — E785 Hyperlipidemia, unspecified: Secondary | ICD-10-CM

## 2013-07-01 DIAGNOSIS — Z1239 Encounter for other screening for malignant neoplasm of breast: Secondary | ICD-10-CM

## 2013-07-02 NOTE — Progress Notes (Signed)
Subjective:    Patient ID: Tiffany Charles, female    DOB: [birth year], 80 y.o.   MRN: [id number]  HPI Comments: Patient is an 80 year old female who presents to the office to establish care. Patient has not seen a PCP in some time, follow with cardiology for well controlled A-Fib, CAD and chronic anticoagulation. History of SOB, uses albuterol when needed. Patient lives with two sons, drives herself, cooks for the household, has had one fall in the last year because she tripped over the vacuum cord. Has not seen dentist in quit sometime, has upper and lower dentures with no irritations to gums. Reports has not been to eye doctor in a couple of years. States last mammogram in 2/14, would like this years scheduled. Patient has been seeing cardiologist, Dr. Sallyanne Kuster, and would like referral to him to satisfy insurance. States does not require refills at this time. Denies concerns at today's appointment.    Review of Systems  Constitutional: Negative for fever, chills, appetite change and fatigue.  HENT: Negative for trouble swallowing.   Eyes: Negative for pain and visual disturbance.  Respiratory: Positive for shortness of breath (at baselie, does not experience often). Negative for cough, chest tightness and wheezing.   Cardiovascular: Negative for chest pain and palpitations.  Gastrointestinal: Negative for nausea, vomiting, diarrhea and constipation.  Skin: Negative for rash.  Neurological: Negative for dizziness, weakness, numbness and headaches.  All other systems reviewed and are negative.      Objective:   Physical Exam  Vitals reviewed. Constitutional: She is oriented to person, place, and time. She appears well-developed and well-nourished. No distress.  Patient seated in exam room chair, appears comfortable in NAD. Appears stated age  HENT:  Head: Normocephalic and atraumatic.  Right Ear: Tympanic membrane, external ear and ear canal normal. Decreased hearing is noted.  Left  Ear: Tympanic membrane, external ear and ear canal normal. Decreased hearing is noted.  Nose: Nose normal.  Mouth/Throat: Uvula is midline, oropharynx is clear and moist and mucous membranes are normal. She has dentures (upper and lower). No trismus in the jaw.  Eyes: Conjunctivae and EOM are normal. Pupils are equal, round, and reactive to light. No scleral icterus.  Neck: Trachea normal and normal range of motion. Neck supple. Carotid bruit is not present. No tracheal deviation present.  Cardiovascular: Normal rate, regular rhythm, S1 normal, S2 normal, normal heart sounds and intact distal pulses.  Exam reveals no gallop and no friction rub.   No murmur heard. Pulses:      Radial pulses are 2+ on the right side, and 2+ on the left side.       Posterior tibial pulses are 2+ on the right side, and 2+ on the left side.  Pulmonary/Chest: Effort normal. No respiratory distress. She has decreased breath sounds. She has no wheezes. She has no rhonchi. She has no rales.  Abdominal: Soft. Bowel sounds are normal.  Musculoskeletal: Normal range of motion.  Lymphadenopathy:    She has no cervical adenopathy.  Neurological: She is alert and oriented to person, place, and time. No sensory deficit. She displays a negative Romberg sign. Coordination normal. GCS eye subscore is 4. GCS verbal subscore is 5. GCS motor subscore is 6.  Gait is slow  Skin: Skin is warm and dry.  Psychiatric: She has a normal mood and affect.   Lab Results  Component Value Date   WBC 9.5 12/28/2012   HGB 12.8 12/28/2012   HCT 36.7  12/28/2012   PLT 246 12/28/2012   GLUCOSE 84 12/30/2012   NA 137 12/30/2012   K 4.0 12/30/2012   CL 104 12/30/2012   CREATININE 0.94 12/30/2012   BUN 14 12/30/2012   CO2 26 12/30/2012   TSH 0.826 12/28/2012   INR 1.13 06/04/2011       Assessment & Plan:    CPX/v70.0 - Patient has been counseled on age-appropriate routine health concerns for screening and prevention. These are reviewed and up-to-date.  Immunizations are up-to-date or declined. Labs ordered and will be reviewed.  Referral to cardiology Referral for mammogram.

## 2013-07-07 ENCOUNTER — Encounter: Payer: Self-pay | Admitting: Cardiovascular Disease

## 2013-07-07 ENCOUNTER — Ambulatory Visit (INDEPENDENT_AMBULATORY_CARE_PROVIDER_SITE_OTHER): Payer: Medicare HMO | Admitting: Cardiovascular Disease

## 2013-07-07 VITALS — BP 114/76 | HR 82 | Resp 20 | Ht 67.5 in | Wt 131.3 lb

## 2013-07-07 DIAGNOSIS — I4891 Unspecified atrial fibrillation: Secondary | ICD-10-CM

## 2013-07-07 DIAGNOSIS — Z79899 Other long term (current) drug therapy: Secondary | ICD-10-CM

## 2013-07-07 DIAGNOSIS — E785 Hyperlipidemia, unspecified: Secondary | ICD-10-CM

## 2013-07-07 DIAGNOSIS — I4821 Permanent atrial fibrillation: Secondary | ICD-10-CM

## 2013-07-07 DIAGNOSIS — E782 Mixed hyperlipidemia: Secondary | ICD-10-CM

## 2013-07-07 DIAGNOSIS — J449 Chronic obstructive pulmonary disease, unspecified: Secondary | ICD-10-CM

## 2013-07-07 DIAGNOSIS — I2510 Atherosclerotic heart disease of native coronary artery without angina pectoris: Secondary | ICD-10-CM

## 2013-07-07 MED ORDER — RANOLAZINE ER 1000 MG PO TB12: 1000.0000 mg | ORAL_TABLET | Freq: Two times a day (BID) | ORAL | Status: DC

## 2013-07-07 NOTE — Assessment & Plan Note (Signed)
Adequate rate control and anticoagulation. History of severe bradycardia with beta blockers and centrally acting calcium channel blockers in the past.

## 2013-07-07 NOTE — Patient Instructions (Signed)
STOP the Isosorbide.  If the chest pain comes comes back on a frequent basis you can restart this one daily.  Your physician recommends that you return for lab work in: FASTING blood work.  Your physician recommends that you schedule a follow-up appointment in: 6 months with Dr Sallyanne Kuster.

## 2013-07-07 NOTE — Progress Notes (Signed)
Patient ID: Tiffany Charles, female   DOB: [birth year], 80 y.o.   MRN: [id number]      Reason for office visit Atrial fibrillation, coronary artery disease  Tiffany Charles has known multivessel coronary artery disease and has previously undergone stent placement to the distal right coronary artery. She is also known to have high-grade stenosis in the distal left circumflex coronary artery but this was felt to be inamenable to percutaneous revascularization. A nuclear stress test performed in February of 2013 did not show any evidence of meaningful ischemia. She has preserved left ventricular systolic function.   She had episodes of mild chest tightness consistent with angina during moderate exertion , whilst on treatment with calcium channel blockers and nitrates. Having Ranexa has led to complete resolution of her exertional angina. She quit smoking successfully about 2 years ago but has a history of almost 60 years of smoking a pack a day. 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 She denies palpitations dizziness or syncope. She has a history of marked bradycardia which was treated with beta blockers in combination with diltiazem.   She has been in permanent atrial fibrillation since October of 2010. Attempted restoration of sinus rhythm with cardioversion in 2011 was only very short lived. She is on anticoagulation with peroxide then and has no history of stroke TIA or serious bleeding complications. She does have fairly frequent epistaxis in the wintertime.   Allergies  Allergen Reactions  . Codeine Nausea And Vomiting    Current Outpatient Prescriptions  Medication Sig Dispense Refill  . albuterol (PROVENTIL HFA;VENTOLIN HFA) 108 (90 BASE) MCG/ACT inhaler Inhale 2 puffs into the lungs every 6 (six) hours as needed for wheezing.      Marland Kitchen amLODipine (NORVASC) 5 MG tablet Take 1 tablet (5 mg total) by mouth daily.  90 tablet  3  . aspirin EC  81 MG tablet Take 81 mg by mouth daily.      . citalopram (CELEXA) 20 MG tablet Take 1 tablet (46m) by mouth daily. Please request future refills from primary care provider.  30 tablet  0  . digoxin (LANOXIN) 0.125 MG tablet Take 125 mcg by mouth every morning.       . nitroGLYCERIN (NITROLINGUAL) 0.4 MG/SPRAY spray Place 1 spray under the tongue every 5 (five) minutes as needed for chest pain.      . pravastatin (PRAVACHOL) 80 MG tablet Take 1 tablet (80 mg total) by mouth at bedtime.  30 tablet  11  . ranolazine (RANEXA) 1000 MG SR tablet Take 1 tablet (1,000 mg total) by mouth 2 (two) times daily.  112 tablet  0  . Rivaroxaban (XARELTO) 20 MG TABS tablet Take 1 tablet (20 mg total) by mouth daily.  30 tablet  5   No current facility-administered medications for this visit.    Past Medical History  Diagnosis Date  . Permanent atrial fibrillation   . Angina pectoris   . Hyperlipidemia   . Arthritis   . Coronary artery disease 2010    residual AV groove lesion- Medical Rx  . Stented coronary artery 2009    RCA stent  . Skin cancer Jan 2013    squamous cell- removed  . COPD (chronic obstructive pulmonary disease)   . Chronic anticoagulation     Xarelto  . Depression     Past Surgical History  Procedure Laterality Date  . Abdominal hysterectomy    . Hip surgery    .  Coronary angioplasty with stent placement  2009    RCA Vision stent  .  cataracts removed    . Melanoma excision  06/06/2011    Procedure: MELANOMA EXCISION;  Surgeon: Adin Hector, MD;  Location: [facility];  Service: General;  Laterality: Left;  re excision squamous cell lesion left chest wall   . Cardioversion  06/05/2009    3 attempts, last attempt successful  . Cardiac catheterization  03/22/2009    Started medical therapy    Family History  Problem Relation Age of Onset  . Stroke Mother   . Heart disease Mother   . Coronary artery disease Father   . Heart disease Father   . Stroke Sister   . Heart  disease Brother   . Stroke Sister   . Stroke Sister     History   Social History  . Marital Status: Widowed    Spouse Name: N/A    Number of Children: N/A  . Years of Education: N/A   Occupational History  . Not on file.   Social History Main Topics  . Smoking status: Former Smoker    Quit date: 05/28/2010  . Smokeless tobacco: Never Used  . Alcohol Use: No  . Drug Use: No  . Sexual Activity: No   Other Topics Concern  . Not on file   Social History Narrative  . No narrative on file    Review of systems: The patient specifically denies any chest pain at rest, dyspnea at rest, orthopnea, paroxysmal nocturnal dyspnea, syncope, palpitations, focal neurological deficits, intermittent claudication, lower extremity edema, unexplained weight gain, hemoptysis..  The patient also denies abdominal pain, nausea, vomiting, dysphagia, diarrhea, constipation, polyuria, polydipsia, dysuria, hematuria, frequency, urgency, fever, chills, unexpected weight changes, mood swings, change in skin or hair texture, change in voice quality, auditory or visual problems, allergic reactions or rashes, new musculoskeletal complaints other than usual "aches and pains".  PHYSICAL EXAM BP 114/76  Pulse 82  Resp 20  Ht 5' 7.5" (1.715 m)  Wt 59.557 kg (131 lb 4.8 oz)  BMI 20.25 kg/m2 General: Alert, oriented x3, no distress; she is very slender  Head: no evidence of trauma, PERRL, EOMI, no exophtalmos or lid lag, no myxedema, no xanthelasma; normal ears, nose and oropharynx  Neck: normal jugular venous pulsations and no hepatojugular reflux; brisk carotid pulses without delay and no carotid bruits  Chest: clear to auscultation, no signs of consolidation by percussion or palpation, normal fremitus, symmetrical and full respiratory excursions  Cardiovascular: normal position and quality of the apical impulse, irregular rhythm, normal first and second heart sounds, no rubs or gallops, grade 1/6 early  peaking systolic ejection murmur heard best at the left sternal border  Abdomen: no tenderness or distention, no masses by palpation, no abnormal pulsatility or arterial bruits, normal bowel sounds, no hepatosplenomegaly  Extremities: no clubbing, cyanosis or edema; 2+ radial, ulnar and brachial pulses bilaterally; 2+ right femoral, posterior tibial and dorsalis pedis pulses; 2+ left femoral, posterior tibial and dorsalis pedis pulses; no subclavian or femoral bruits  Neurological: grossly nonfocal   EKG: Atrial fibrillation QS pattern in lead V1 V2 otherwise normal   BMET    Component Value Date/Time   NA 137 12/30/2012 0415   K 4.0 12/30/2012 0415   CL 104 12/30/2012 0415   CO2 26 12/30/2012 0415   GLUCOSE 84 12/30/2012 0415   BUN 14 12/30/2012 0415   CREATININE 0.94 12/30/2012 0415   CALCIUM 8.8 12/30/2012 0415  GFRNONAA 56* 12/30/2012 0415   GFRAA 65* 12/30/2012 0415     ASSESSMENT AND PLAN Permanent atrial fibrillation Adequate rate control and anticoagulation. History of severe bradycardia with beta blockers and centrally acting calcium channel blockers in the past.  COPD - PFTs in June 2014 with MET test    CAD, RCA BMS '09, 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 low risk Myoview Feb 2013 It is possible that her exertional angina is related to residual high-grade stenosis in the terminal left circumflex coronary artery not amenable to revascularization. Angina has resolved completely. Ranexa seems to have made a big difference. She asked for simplification of her medical regimen. I asked her to discontinue the isosorbide, but resume it if she is to take sublingual nitroglycerin more than a couple of times a week  Hyperlipidemia Plan to repeat a lipid profile    Patient Instructions  STOP the Isosorbide.  If the chest pain comes comes back on a frequent basis you can restart this one daily.  Your physician recommends that you return for lab work in: FASTING blood work.  Your  physician recommends that you schedule a follow-up appointment in: 6 months with Dr Sallyanne Kuster.      Orders Placed This Encounter  Procedures  . Lipid Profile  . Comp Met (CMET)  . EKG 12-Lead   Meds ordered this encounter  Medications  . ranolazine (RANEXA) 1000 MG SR tablet    Sig: Take 1 tablet (1,000 mg total) by mouth 2 (two) times daily.    Dispense:  112 tablet    Refill:  0    Samples given LOT:  [id number]  EXP:  5/18    Order Specific Question:  Supervising Provider    Answer:  Pixie Casino [[id number]]    Holli Humbles, MD, [place] [phone] office [phone] pager

## 2013-07-07 NOTE — Assessment & Plan Note (Signed)
Plan to repeat a lipid profile 

## 2013-07-07 NOTE — Assessment & Plan Note (Signed)
It is possible that her exertional angina is related to residual high-grade stenosis in the terminal left circumflex coronary artery not amenable to revascularization. Angina has resolved completely. Ranexa seems to have made a big difference. She asked for simplification of her medical regimen. I asked her to discontinue the isosorbide, but resume it if she is to take sublingual nitroglycerin more than a couple of times a week

## 2013-07-11 LAB — LIPID PANEL
Cholesterol: 193 mg/dL (ref 0–200)
HDL: 49 mg/dL
LDL Cholesterol: 120 mg/dL — ABNORMAL HIGH (ref 0–99)
Total CHOL/HDL Ratio: 3.9 ratio
Triglycerides: 118 mg/dL
VLDL: 24 mg/dL (ref 0–40)

## 2013-07-11 LAB — COMPREHENSIVE METABOLIC PANEL
ALK PHOS: 68 U/L (ref 39–117)
ALT: 14 U/L (ref 0–35)
AST: 29 U/L (ref 0–37)
Albumin: 4.2 g/dL (ref 3.5–5.2)
BILIRUBIN TOTAL: 0.5 mg/dL (ref 0.2–1.2)
BUN: 17 mg/dL (ref 6–23)
CO2: 30 meq/L (ref 19–32)
Calcium: 9.3 mg/dL (ref 8.4–10.5)
Chloride: 105 mEq/L (ref 96–112)
Creat: 0.87 mg/dL (ref 0.50–1.10)
GLUCOSE: 93 mg/dL (ref 70–99)
Potassium: 4.4 mEq/L (ref 3.5–5.3)
Sodium: 141 mEq/L (ref 135–145)
Total Protein: 6.6 g/dL (ref 6.0–8.3)

## 2013-07-14 ENCOUNTER — Telehealth: Payer: Self-pay | Admitting: *Deleted

## 2013-07-14 DIAGNOSIS — E782 Mixed hyperlipidemia: Secondary | ICD-10-CM

## 2013-07-14 DIAGNOSIS — Z79899 Other long term (current) drug therapy: Secondary | ICD-10-CM

## 2013-07-14 MED ORDER — PRAVASTATIN SODIUM 80 MG PO TABS: 80.0000 mg | ORAL_TABLET | Freq: Every day | ORAL | Status: DC

## 2013-07-14 NOTE — Telephone Encounter (Signed)
Has not been taking pravastatin 80mg  regularly.  New Rx sent to [facility] and will restart QHS and get labs rechecked in 3 months.  Patient voiced understanding.

## 2013-07-14 NOTE — Telephone Encounter (Signed)
Message copied by Tressa Busman on Thu Jul 14, 2013  9:43 AM ------      Message from: Sanda Klein      Created: Tue Jul 12, 2013  4:55 PM       Is she taking the pravastatin consistently?       If no, please tell her to do so - cholesterol is too high.      If yes, please tell her we will switch to atorvastatin 40 mg daily and recheck in 3 months ------

## 2013-08-02 LAB — HM MAMMOGRAPHY

## 2013-08-03 ENCOUNTER — Encounter: Payer: Self-pay | Admitting: Physician Assistant

## 2013-08-15 ENCOUNTER — Telehealth: Payer: Self-pay | Admitting: *Deleted

## 2013-08-15 NOTE — Telephone Encounter (Signed)
Citalopram 20mg  qd refill faxed #30 w/11 refills.

## 2013-10-04 ENCOUNTER — Other Ambulatory Visit: Payer: Self-pay | Admitting: *Deleted

## 2013-10-04 MED ORDER — DIGOXIN 125 MCG PO TABS: 125.0000 ug | ORAL_TABLET | Freq: Every morning | ORAL | Status: DC

## 2013-10-04 NOTE — Telephone Encounter (Signed)
Rx refill sent to patient pharmacy   

## 2013-10-19 ENCOUNTER — Ambulatory Visit: Payer: Medicare HMO | Admitting: Internal Medicine

## 2013-10-19 DIAGNOSIS — Z0289 Encounter for other administrative examinations: Secondary | ICD-10-CM

## 2013-11-07 ENCOUNTER — Encounter: Payer: Self-pay | Admitting: Internal Medicine

## 2013-11-07 ENCOUNTER — Ambulatory Visit (INDEPENDENT_AMBULATORY_CARE_PROVIDER_SITE_OTHER): Payer: Commercial Managed Care - HMO | Admitting: Internal Medicine

## 2013-11-07 VITALS — BP 126/66 | HR 94 | Temp 98.6°F | Resp 16 | Ht 67.5 in | Wt 131.5 lb

## 2013-11-07 DIAGNOSIS — I4821 Permanent atrial fibrillation: Secondary | ICD-10-CM

## 2013-11-07 DIAGNOSIS — Z0000 Encounter for general adult medical examination without abnormal findings: Secondary | ICD-10-CM

## 2013-11-07 DIAGNOSIS — Z23 Encounter for immunization: Secondary | ICD-10-CM

## 2013-11-07 DIAGNOSIS — F329 Major depressive disorder, single episode, unspecified: Secondary | ICD-10-CM | POA: Insufficient documentation

## 2013-11-07 DIAGNOSIS — I4891 Unspecified atrial fibrillation: Secondary | ICD-10-CM

## 2013-11-07 MED ORDER — CITALOPRAM HYDROBROMIDE 20 MG PO TABS
ORAL_TABLET | ORAL | Status: DC
Start: 2013-11-07 — End: 2016-09-17

## 2013-11-07 NOTE — Progress Notes (Signed)
Subjective:    Patient ID: Tiffany Charles, female    DOB: 09-18-1929, 80 y.o.   MRN: [id number]  HPI Comments: New to me she needs a f/up regarding A fib.     Review of Systems  Constitutional: Negative for fever, chills, diaphoresis, appetite change and fatigue.  HENT: Negative.   Eyes: Negative.   Respiratory: Negative.  Negative for cough, choking, chest tightness, shortness of breath, wheezing and stridor.   Cardiovascular: Negative.  Negative for chest pain, palpitations and leg swelling.  Gastrointestinal: Negative.  Negative for nausea, vomiting, abdominal pain, diarrhea, constipation and blood in stool.  Endocrine: Negative.   Genitourinary: Negative.   Musculoskeletal: Negative.  Negative for back pain.  Skin: Negative for rash.  Allergic/Immunologic: Negative.   Neurological: Negative.  Negative for dizziness, tremors, weakness, light-headedness, numbness and headaches.  Hematological: Negative.  Negative for adenopathy. Does not bruise/bleed easily.  Psychiatric/Behavioral: Positive for dysphoric mood and decreased concentration. Negative for suicidal ideas, hallucinations, behavioral problems, confusion, sleep disturbance, self-injury and agitation. The patient is nervous/anxious. The patient is not hyperactive.        Objective:   Physical Exam  Vitals reviewed. Constitutional: She is oriented to person, place, and time. She appears well-developed and well-nourished. No distress.  HENT:  Head: Normocephalic and atraumatic.  Mouth/Throat: Oropharynx is clear and moist.  Eyes: Conjunctivae are normal. Right eye exhibits no discharge. Left eye exhibits no discharge. No scleral icterus.  Neck: Normal range of motion. Neck supple. No JVD present. No tracheal deviation present. No thyromegaly present.  Cardiovascular: Normal rate, S1 normal, S2 normal, normal heart sounds and intact distal pulses.  An irregularly irregular rhythm present. Exam reveals no gallop and no  friction rub.   No murmur heard. Pulses:      Carotid pulses are 1+ on the right side, and 1+ on the left side.      Radial pulses are 1+ on the right side, and 1+ on the left side.       Femoral pulses are 1+ on the right side, and 1+ on the left side.      Popliteal pulses are 1+ on the right side, and 1+ on the left side.       Dorsalis pedis pulses are 1+ on the right side, and 1+ on the left side.       Posterior tibial pulses are 1+ on the right side, and 1+ on the left side.  Pulmonary/Chest: Effort normal and breath sounds normal. No stridor. No respiratory distress. She has no wheezes. She has no rales. She exhibits no tenderness.  Abdominal: Soft. Bowel sounds are normal. She exhibits no distension and no mass. There is no tenderness. There is no rebound and no guarding.  Musculoskeletal: Normal range of motion. She exhibits no edema and no tenderness.  Lymphadenopathy:    She has no cervical adenopathy.  Neurological: She is oriented to person, place, and time.  Skin: Skin is warm and dry. No rash noted. She is not diaphoretic. No erythema. No pallor.  Psychiatric: Judgment and thought content normal. Her mood appears anxious. Her affect is not angry, not blunt, not labile and not inappropriate. Her speech is delayed. Her speech is not rapid and/or pressured, not tangential and not slurred. She is slowed. She is not agitated, not hyperactive, not withdrawn, not actively hallucinating and not combative. Cognition and memory are impaired. She exhibits a depressed mood. She expresses no homicidal and no suicidal ideation. She expresses no  suicidal plans and no homicidal plans. She is communicative. She exhibits abnormal recent memory and abnormal remote memory. She is attentive.    Lab Results  Component Value Date   WBC 9.5 12/28/2012   HGB 12.8 12/28/2012   HCT 36.7 12/28/2012   PLT 246 12/28/2012   GLUCOSE 93 07/11/2013   CHOL 193 07/11/2013   TRIG 118 07/11/2013   HDL 49 07/11/2013    LDLCALC 120* 07/11/2013   ALT 14 07/11/2013   AST 29 07/11/2013   NA 141 07/11/2013   K 4.4 07/11/2013   CL 105 07/11/2013   CREATININE 0.87 07/11/2013   BUN 17 07/11/2013   CO2 30 07/11/2013   TSH 0.826 12/28/2012   INR 1.13 06/04/2011        Assessment & Plan:

## 2013-11-07 NOTE — Progress Notes (Signed)
Pre visit review using our clinic review tool, if applicable. No additional management support is needed unless otherwise documented below in the visit note. 

## 2013-11-07 NOTE — Patient Instructions (Signed)
Atrial Fibrillation  Atrial fibrillation is a type of irregular heart rhythm (arrhythmia). During atrial fibrillation, the upper chambers of the heart (atria) quiver continuously in a chaotic pattern. This causes an irregular and often rapid heart rate.   Atrial fibrillation is the result of the heart becoming overloaded with disorganized signals that tell it to beat. These signals are normally released one at a time by a part of the right atrium called the sinoatrial node. They then travel from the atria to the lower chambers of the heart (ventricles), causing the atria and ventricles to contract and pump blood as they pass. In atrial fibrillation, parts of the atria outside of the sinoatrial node also release these signals. This results in two problems. First, the atria receive so many signals that they do not have time to fully contract. Second, the ventricles, which can only receive one signal at a time, beat irregularly and out of rhythm with the atria.   There are three types of atrial fibrillation:    Paroxysmal Paroxysmal atrial fibrillation starts suddenly and stops on its own within a week.    Persistent Persistent atrial fibrillation lasts for more than a week. It may stop on its own or with treatment.    Permanent Permanent atrial fibrillation does not go away. Episodes of atrial fibrillation may lead to permanent atrial fibrillation.   Atrial fibrillation can prevent your heart from pumping blood normally. It increases your risk of stroke and can lead to heart failure.   CAUSES    Heart conditions, including a heart attack, heart failure, coronary artery disease, and heart valve conditions.    Inflammation of the sac that surrounds the heart (pericarditis).    Blockage of an artery in the lungs (pulmonary embolism).    Pneumonia or other infections.    Chronic lung disease.    Thyroid problems, especially if the thyroid is overactive (hyperthyroidism).    Caffeine, excessive alcohol  use, and use of some illegal drugs.    Use of some medications, including certain decongestants and diet pills.    Heart surgery.    Birth defects.   Sometimes, no cause can be found. When this happens, the atrial fibrillation is called lone atrial fibrillation. The risk of complications from atrial fibrillation increases if you have lone atrial fibrillation and you are age 60 years or older.  RISK FACTORS   Heart failure.   Coronary artery disease   Diabetes mellitus.    High blood pressure (hypertension).    Obesity.    Other arrhythmias.    Increased age.  SYMPTOMS    A feeling that your heart is beating rapidly or irregularly.    A feeling of discomfort or pain in your chest.    Shortness of breath.    Sudden lightheadedness or weakness.    Getting tired easily when exercising.    Urinating more often than normal (mainly when atrial fibrillation first begins).   In paroxysmal atrial fibrillation, symptoms may start and suddenly stop.  DIAGNOSIS   Your caregiver may be able to detect atrial fibrillation when taking your pulse. Usually, testing is needed to diagnosis atrial fibrillation. Tests may include:    Electrocardiography. During this test, the electrical impulses of your heart are recorded while you are lying down.    Echocardiography. During echocardiography, sound waves are used to evaluate how blood flows through your heart.    Stress test. There is more than one type of stress test. If a stress test is   needed, ask your caregiver about which type is best for you.    Chest X-ray exam.    Blood tests.    Computed tomography (CT).   TREATMENT    Treating any underlying conditions. For example, if you have an overactive thyroid, treating the condition may correct atrial fibrillation.    Medication. Medications may be given to control a rapid heart rate or to prevent blood clots, heart failure, or a stroke.    Procedure to correct the rhythm of the  heart:   Electrical cardioversion. During electrical cardioversion, a controlled, low-energy shock is delivered to the heart through your skin. If you have chest pain, very low pressure blood pressure, or sudden heart failure, this procedure may need to be done as an emergency.   Catheter ablation. During this procedure, heart tissues that send the signals that cause atrial fibrillation are destroyed.   Maze or minimaze procedure. During this surgery, thin lines of heart tissue that carry the abnormal signals are destroyed. The maze procedure is an open-heart surgery. The minimaze procedure is a minimally invasive surgery. This means that small cuts are made to access the heart instead of a large opening.   Pulmonary venous isolation. During this surgery, tissue around the veins that carry blood from the lungs (pulmonary veins) is destroyed. This tissue is thought to carry the abnormal signals.  HOME CARE INSTRUCTIONS    Take medications as directed by your caregiver.   Only take medications that your caregiver approves. Some medications can make atrial fibrillation worse or recur.   If blood thinners were prescribed by your caregiver, take them exactly as directed. Too much can cause bleeding. Too little and you will not have the needed protection against stroke and other problems.   Perform blood tests at home if directed by your caregiver.   Perform blood tests exactly as directed.    Quit smoking if you smoke.    Do not drink alcohol.    Do not drink caffeinated beverages such as coffee, soda, and some teas. You may drink decaffeinated coffee, soda, or tea.    Maintain a healthy weight. Do not use diet pills unless your caregiver approves. They may make heart problems worse.    Follow diet instructions as directed by your caregiver.    Exercise regularly as directed by your caregiver.    Keep all follow-up appointments.  PREVENTION   The following substances can cause atrial fibrillation  to recur:    Caffeinated beverages.    Alcohol.    Certain medications, especially those used for breathing problems.    Certain herbs and herbal medications, such as those containing ephedra or ginseng.   Illegal drugs such as cocaine and amphetamines.  Sometimes medications are given to prevent atrial fibrillation from recurring. Proper treatment of any underlying condition is also important in helping prevent recurrence.   SEEK MEDICAL CARE IF:   You notice a change in the rate, rhythm, or strength of your heartbeat.    You suddenly begin urinating more frequently.    You tire more easily when exerting yourself or exercising.   SEEK IMMEDIATE MEDICAL CARE IF:    You develop chest pain, abdominal pain, sweating, or weakness.   You feel sick to your stomach (nauseous).   You develop shortness of breath.   You suddenly develop swollen feet and ankles.   You feel dizzy.   You face or limbs feel numb or weak.   There is a change in your   vision or speech.  MAKE SURE YOU:    Understand these instructions.   Will watch your condition.   Will get help right away if you are not doing well or get worse.  Document Released: 05/12/2005 Document Revised: 09/06/2012 Document Reviewed: 06/22/2012  [employer] Patient Information 2014 [employer].

## 2013-11-08 ENCOUNTER — Encounter: Payer: Self-pay | Admitting: Internal Medicine

## 2013-11-08 NOTE — Assessment & Plan Note (Addendum)
She has good rate & rhythm control Cont xarelto

## 2013-11-08 NOTE — Assessment & Plan Note (Signed)
She will restart celexa

## 2013-11-09 DIAGNOSIS — Z0000 Encounter for general adult medical examination without abnormal findings: Secondary | ICD-10-CM | POA: Insufficient documentation

## 2013-11-09 DIAGNOSIS — Z7189 Other specified counseling: Secondary | ICD-10-CM | POA: Insufficient documentation

## 2013-11-09 DIAGNOSIS — Z23 Encounter for immunization: Secondary | ICD-10-CM

## 2013-11-09 LAB — HM COLONOSCOPY

## 2013-11-09 NOTE — Assessment & Plan Note (Addendum)

## 2014-12-28 ENCOUNTER — Ambulatory Visit (INDEPENDENT_AMBULATORY_CARE_PROVIDER_SITE_OTHER): Payer: Commercial Managed Care - HMO | Admitting: Internal Medicine

## 2014-12-28 ENCOUNTER — Other Ambulatory Visit (INDEPENDENT_AMBULATORY_CARE_PROVIDER_SITE_OTHER): Payer: Commercial Managed Care - HMO

## 2014-12-28 ENCOUNTER — Encounter: Payer: Self-pay | Admitting: Internal Medicine

## 2014-12-28 VITALS — BP 138/92 | HR 87 | Temp 97.7°F | Resp 16 | Ht 68.0 in | Wt 131.0 lb

## 2014-12-28 DIAGNOSIS — I482 Chronic atrial fibrillation: Secondary | ICD-10-CM | POA: Diagnosis not present

## 2014-12-28 DIAGNOSIS — E785 Hyperlipidemia, unspecified: Secondary | ICD-10-CM | POA: Diagnosis not present

## 2014-12-28 DIAGNOSIS — M25562 Pain in left knee: Secondary | ICD-10-CM | POA: Insufficient documentation

## 2014-12-28 DIAGNOSIS — I4821 Permanent atrial fibrillation: Secondary | ICD-10-CM

## 2014-12-28 DIAGNOSIS — I2510 Atherosclerotic heart disease of native coronary artery without angina pectoris: Secondary | ICD-10-CM | POA: Diagnosis not present

## 2014-12-28 DIAGNOSIS — M159 Polyosteoarthritis, unspecified: Secondary | ICD-10-CM

## 2014-12-28 DIAGNOSIS — I4891 Unspecified atrial fibrillation: Secondary | ICD-10-CM | POA: Diagnosis not present

## 2014-12-28 LAB — CBC WITH DIFFERENTIAL/PLATELET
BASOS PCT: 0.4 % (ref 0.0–3.0)
Basophils Absolute: 0 10*3/uL (ref 0.0–0.1)
EOS ABS: 0.1 10*3/uL (ref 0.0–0.7)
Eosinophils Relative: 1.2 % (ref 0.0–5.0)
HCT: 44.2 % (ref 36.0–46.0)
Hemoglobin: 14.9 g/dL (ref 12.0–15.0)
Lymphocytes Relative: 35.2 % (ref 12.0–46.0)
Lymphs Abs: 3.4 10*3/uL (ref 0.7–4.0)
MCHC: 33.8 g/dL (ref 30.0–36.0)
MCV: 96.7 fl (ref 78.0–100.0)
MONO ABS: 0.7 10*3/uL (ref 0.1–1.0)
MONOS PCT: 7.3 % (ref 3.0–12.0)
NEUTROS ABS: 5.5 10*3/uL (ref 1.4–7.7)
Neutrophils Relative %: 55.9 % (ref 43.0–77.0)
Platelets: 217 10*3/uL (ref 150.0–400.0)
RBC: 4.57 Mil/uL (ref 3.87–5.11)
RDW: 13.5 % (ref 11.5–15.5)
WBC: 9.8 10*3/uL (ref 4.0–10.5)

## 2014-12-28 LAB — LIPID PANEL
Cholesterol: 182 mg/dL (ref 0–200)
HDL: 64.5 mg/dL (ref >39.00)
LDL Cholesterol: 107 mg/dL — ABNORMAL HIGH (ref 0–99)
NonHDL: 117.28
Total CHOL/HDL Ratio: 3
Triglycerides: 49 mg/dL (ref 0.0–149.0)
VLDL: 9.8 mg/dL (ref 0.0–40.0)

## 2014-12-28 LAB — COMPREHENSIVE METABOLIC PANEL
ALK PHOS: 77 U/L (ref 39–117)
ALT: 12 U/L (ref 0–35)
AST: 29 U/L (ref 0–37)
Albumin: 4.5 g/dL (ref 3.5–5.2)
BILIRUBIN TOTAL: 0.5 mg/dL (ref 0.2–1.2)
BUN: 21 mg/dL (ref 6–23)
CHLORIDE: 103 meq/L (ref 96–112)
CO2: 28 mEq/L (ref 19–32)
Calcium: 9.8 mg/dL (ref 8.4–10.5)
Creatinine, Ser: 0.94 mg/dL (ref 0.40–1.20)
GFR: 60.49 mL/min (ref >60.00)
Glucose, Bld: 94 mg/dL (ref 70–99)
Potassium: 4.2 mEq/L (ref 3.5–5.1)
SODIUM: 137 meq/L (ref 135–145)
Total Protein: 7.7 g/dL (ref 6.0–8.3)

## 2014-12-28 LAB — TSH: TSH: 1.47 u[IU]/mL (ref 0.35–4.50)

## 2014-12-28 MED ORDER — TRAMADOL HCL 50 MG PO TABS: 50.0000 mg | ORAL_TABLET | Freq: Two times a day (BID) | ORAL | Status: DC | PRN

## 2014-12-28 NOTE — Progress Notes (Signed)
Subjective:  Patient ID: Tiffany Charles, female    DOB: August 02, 1929  Age: 81 y.o. MRN: [id number]  CC: Hypertension; Knee Pain; Osteoarthritis; Hyperlipidemia; and Atrial Fibrillation   HPI [name] presents for the complaint of aching pain in both legs mostly in the knees, more on the left than the right. This is been a worsening problem for her for several years. It interferes with her sleep and activities. She is not taking anything for pain.  Outpatient Prescriptions Prior to Visit  Medication Sig Dispense Refill  . albuterol (PROVENTIL HFA;VENTOLIN HFA) 108 (90 BASE) MCG/ACT inhaler Inhale 2 puffs into the lungs every 6 (six) hours as needed for wheezing.    Marland Kitchen amLODipine (NORVASC) 5 MG tablet Take 1 tablet (5 mg total) by mouth daily. 90 tablet 3  . aspirin EC 81 MG tablet Take 81 mg by mouth daily.    . citalopram (CELEXA) 20 MG tablet Take 1 tablet (20mg ) by mouth daily. Please request future refills from primary care provider. 90 tablet 3  . digoxin (LANOXIN) 0.125 MG tablet Take 1 tablet (125 mcg total) by mouth every morning. 30 tablet 6  . isosorbide mononitrate (IMDUR) 30 MG 24 hr tablet     . nitroGLYCERIN (NITROLINGUAL) 0.4 MG/SPRAY spray Place 1 spray under the tongue every 5 (five) minutes as needed for chest pain.    . pravastatin (PRAVACHOL) 80 MG tablet Take 1 tablet (80 mg total) by mouth at bedtime. 30 tablet 11  . ranolazine (RANEXA) 1000 MG SR tablet Take 1 tablet (1,000 mg total) by mouth 2 (two) times daily. 112 tablet 0  . Rivaroxaban (XARELTO) 20 MG TABS tablet Take 1 tablet (20 mg total) by mouth daily. 30 tablet 5   No facility-administered medications prior to visit.    ROS Review of Systems  Constitutional: Negative.  Negative for fever, chills, diaphoresis, appetite change and fatigue.  HENT: Negative.   Eyes: Negative.   Respiratory: Negative.  Negative for cough, choking, chest tightness, shortness of breath and stridor.   Cardiovascular:  Negative.  Negative for chest pain, palpitations and leg swelling.  Gastrointestinal: Negative.  Negative for nausea, vomiting, abdominal pain, diarrhea, constipation and blood in stool.  Endocrine: Negative.   Genitourinary: Negative.   Musculoskeletal: Positive for arthralgias. Negative for myalgias, back pain, joint swelling, gait problem, neck pain and neck stiffness.  Skin: Negative.   Allergic/Immunologic: Negative.   Neurological: Negative.  Negative for dizziness.  Hematological: Negative.  Negative for adenopathy. Does not bruise/bleed easily.  Psychiatric/Behavioral: Negative.     Objective:  BP 138/92 mmHg  Pulse 87  Temp(Src) 97.7 F (36.5 C) (Oral)  Resp 16  Ht 5\' 8"  (1.727 m)  Wt 131 lb (59.421 kg)  BMI 19.92 kg/m2  SpO2 96%  BP Readings from Last 3 Encounters:  12/28/14 138/92  11/07/13 126/66  07/07/13 114/76    Wt Readings from Last 3 Encounters:  12/28/14 131 lb (59.421 kg)  11/07/13 131 lb 8 oz (59.648 kg)  07/07/13 131 lb 4.8 oz (59.557 kg)    Physical Exam  Constitutional: She is oriented to person, place, and time. No distress.  HENT:  Mouth/Throat: Oropharynx is clear and moist. No oropharyngeal exudate.  Eyes: Conjunctivae are normal. Right eye exhibits no discharge. Left eye exhibits no discharge. No scleral icterus.  Neck: Normal range of motion. Neck supple. No JVD present. No tracheal deviation present. No thyromegaly present.  Cardiovascular: Normal rate, normal heart sounds and intact distal pulses.  An irregularly irregular rhythm present. Exam reveals no gallop and no friction rub.   No murmur heard. Pulses:      Carotid pulses are 1+ on the right side, and 1+ on the left side.      Radial pulses are 1+ on the right side, and 1+ on the left side.       Femoral pulses are 1+ on the right side, and 1+ on the left side.      Popliteal pulses are 1+ on the right side, and 1+ on the left side.       Dorsalis pedis pulses are 1+ on the right  side, and 1+ on the left side.       Posterior tibial pulses are 1+ on the right side, and 1+ on the left side.  Pulmonary/Chest: Effort normal and breath sounds normal. No stridor. No respiratory distress. She has no wheezes. She has no rales. She exhibits no tenderness.  Abdominal: Soft. Bowel sounds are normal. She exhibits no distension and no mass. There is no tenderness. There is no rebound and no guarding.  Musculoskeletal: Normal range of motion. She exhibits no edema or tenderness.       Right knee: She exhibits deformity (djd). She exhibits normal range of motion, no swelling, no effusion, no ecchymosis, no laceration, no erythema, normal alignment, no LCL laxity, normal patellar mobility and no bony tenderness. No tenderness found.       Left knee: She exhibits deformity (djd). She exhibits normal range of motion, no swelling, no effusion, no ecchymosis, no laceration, no erythema, normal alignment, no LCL laxity, normal patellar mobility and no bony tenderness. No tenderness found.  Lymphadenopathy:    She has no cervical adenopathy.  Neurological: She is oriented to person, place, and time.  Skin: Skin is warm and dry. No rash noted. She is not diaphoretic. No erythema. No pallor.  Psychiatric: She has a normal mood and affect. Her behavior is normal. Judgment and thought content normal.    Lab Results  Component Value Date   WBC 9.8 12/28/2014   HGB 14.9 12/28/2014   HCT 44.2 12/28/2014   PLT 217.0 12/28/2014   GLUCOSE 94 12/28/2014   CHOL 182 12/28/2014   TRIG 49.0 12/28/2014   HDL 64.50 12/28/2014   LDLCALC 107* 12/28/2014   ALT 12 12/28/2014   AST 29 12/28/2014   NA 137 12/28/2014   K 4.2 12/28/2014   CL 103 12/28/2014   CREATININE 0.94 12/28/2014   BUN 21 12/28/2014   CO2 28 12/28/2014   TSH 1.47 12/28/2014   INR 1.13 06/04/2011    Dg Chest 2 View  12/28/2012   *RADIOLOGY REPORT*  Clinical Data: Chest pain  CHEST - 2 VIEW  Comparison: January 23, 2010  Findings:  There is no focal infiltrate, pulmonary edema, or pleural effusion.  The aorta is tortuous.  Heart size mildly enlarged.  The soft tissues and osseous structures are stable.  IMPRESSION: No acute cardiopulmonary disease identified.   Original Report Authenticated By: Abelardo Diesel, M.D.    Assessment & Plan:   Berdie was seen today for hypertension, knee pain, osteoarthritis, hyperlipidemia and atrial fibrillation.  Diagnoses and all orders for this visit:  Permanent atrial fibrillation- she has good rate control but her rhythm is still in A. fib. Her digoxin level is undetectable. I've asked her to follow-up with cardiology. Orders: -     Comprehensive metabolic panel; Future -     CBC with Differential/Platelet; Future -  Digoxin level; Future -     Ambulatory referral to Cardiology  Coronary artery disease involving native coronary artery of native heart without angina pectoris- she does not report any angina, however I do think it's time for her to follow-up with cardiology. Orders: -     Lipid panel; Future -     Ambulatory referral to Cardiology  Hyperlipidemia- she has achieved her LDL goal and is doing well on the statin. Orders: -     Comprehensive metabolic panel; Future -     TSH; Future  Left knee pain- I will get a plain film done to see how severe the DJD is. She may consider seeing an orthopedic surgeon. Orders: -     DG Knee Complete 4 Views Left; Future  Generalized OA- will treat her pain with tramadol.   I am having Ms. Harker start on traMADol. I am also having her maintain her nitroGLYCERIN, aspirin EC, albuterol, amLODipine, rivaroxaban, ranolazine, pravastatin, digoxin, isosorbide mononitrate, and citalopram.  Meds ordered this encounter  Medications  . traMADol (ULTRAM) 50 MG tablet    Sig: Take 1 tablet (50 mg total) by mouth every 12 (twelve) hours as needed.    Dispense:  60 tablet    Refill:  3     Follow-up: Return in about 4 weeks (around  01/25/2015).  Scarlette Calico, MD

## 2014-12-28 NOTE — Patient Instructions (Signed)

## 2014-12-29 LAB — DIGOXIN LEVEL: Digoxin Level: <0.5 ug/L — ABNORMAL LOW (ref 0.8–2.0)

## 2014-12-31 ENCOUNTER — Encounter: Payer: Self-pay | Admitting: Internal Medicine

## 2015-01-25 ENCOUNTER — Ambulatory Visit: Payer: Medicare HMO | Admitting: Internal Medicine

## 2015-01-25 DIAGNOSIS — Z0289 Encounter for other administrative examinations: Secondary | ICD-10-CM

## 2015-03-15 ENCOUNTER — Encounter: Payer: Self-pay | Admitting: Cardiovascular Disease

## 2015-03-15 ENCOUNTER — Ambulatory Visit (INDEPENDENT_AMBULATORY_CARE_PROVIDER_SITE_OTHER): Payer: Commercial Managed Care - HMO | Admitting: Cardiovascular Disease

## 2015-03-15 VITALS — BP 130/74 | HR 88 | Resp 16 | Ht 67.0 in | Wt 131.0 lb

## 2015-03-15 DIAGNOSIS — E785 Hyperlipidemia, unspecified: Secondary | ICD-10-CM

## 2015-03-15 DIAGNOSIS — I482 Chronic atrial fibrillation: Secondary | ICD-10-CM | POA: Diagnosis not present

## 2015-03-15 DIAGNOSIS — I4821 Permanent atrial fibrillation: Secondary | ICD-10-CM

## 2015-03-15 DIAGNOSIS — R55 Syncope and collapse: Secondary | ICD-10-CM | POA: Diagnosis not present

## 2015-03-15 DIAGNOSIS — Z7901 Long term (current) use of anticoagulants: Secondary | ICD-10-CM

## 2015-03-15 DIAGNOSIS — Z79899 Other long term (current) drug therapy: Secondary | ICD-10-CM

## 2015-03-15 DIAGNOSIS — I2510 Atherosclerotic heart disease of native coronary artery without angina pectoris: Secondary | ICD-10-CM

## 2015-03-15 NOTE — Progress Notes (Signed)
Patient ID: Tiffany Charles, female   DOB: Sep 21, 1929, 81 y.o.   MRN: [id number]     Cardiology Office Note   Date:  03/16/2015   ID:  Tiffany Charles, DOB 1930/04/23, MRN [id number]  PCP:  Tiffany Calico, MD  Cardiologist:   Tiffany Klein, MD   Chief Complaint  Patient presents with  . 18 MONTHS    NO COMPLAINTS.      History of Present Illness: Tiffany Charles is a 81 y.o. female who presents for Atrial fibrillation, coronary artery disease  G[place]ia is doing quite well. She recently rate the leads in her yard without experiencing any angina pectoris. She has mild chronic exertional dyspnea related to COPD. She has not had any focal neurological symptoms. She has not had any recent bleeding. It has been months since she has needed nitroglycerin sublingual.  Mrs. Palen has known multivessel coronary artery disease and has previously undergone stent placement to the distal right coronary artery. She is also known to have high-grade stenosis in the distal left circumflex coronary artery but this was felt to be inamenable to percutaneous revascularization. A nuclear stress test performed in February of 2013 did not show any evidence of meaningful ischemia. She has preserved left ventricular systolic function. Ranexa has led to complete resolution of her exertional angina. She quit smoking successfully about 2 years ago but has a history of almost 60 years of smoking a pack a day. 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   She has been in permanent atrial fibrillation since October of 2010. She denies palpitations, dizziness or syncope. She has a history of marked bradycardia when she was treated with beta blockers in combination with diltiazem. She has been tolerating anticoagulation with Xarelto without bleeding complications and does not have a history of stroke or TIA.  She has not had any bleeding complications recently, although in the  wintertime she occasionally has nosebleeds.  Past Medical History  Diagnosis Date  . Permanent atrial fibrillation ([place])   . Angina pectoris   . Hyperlipidemia   . Arthritis   . Coronary artery disease 2010    residual AV groove lesion- Medical Rx  . Stented coronary artery 2009    RCA stent  . Skin cancer Jan 2013    squamous cell- removed  . COPD (chronic obstructive pulmonary disease) ([place])   . Chronic anticoagulation     Xarelto  . Depression     Past Surgical History  Procedure Laterality Date  . Abdominal hysterectomy    . Hip surgery    . Coronary angioplasty with stent placement  2009    RCA Vision stent  .  cataracts removed    . Melanoma excision  06/06/2011    Procedure: MELANOMA EXCISION;  Surgeon: Adin Hector, MD;  Location: [facility];  Service: General;  Laterality: Left;  re excision squamous cell lesion left chest wall   . Cardioversion  06/05/2009    3 attempts, last attempt successful  . Cardiac catheterization  03/22/2009    Started medical therapy     Current Outpatient Prescriptions  Medication Sig Dispense Refill  . albuterol (PROVENTIL HFA;VENTOLIN HFA) 108 (90 BASE) MCG/ACT inhaler Inhale 2 puffs into the lungs every 6 (six) hours as needed for wheezing.    Marland Kitchen amLODipine (NORVASC) 5 MG tablet Take 1 tablet (5 mg total) by mouth daily. 90 tablet 3  . aspirin EC 81 MG tablet Take 81  mg by mouth daily.    . citalopram (CELEXA) 20 MG tablet Take 1 tablet (57m) by mouth daily. Please request future refills from primary care provider. 90 tablet 3  . digoxin (LANOXIN) 0.125 MG tablet Take 1 tablet (125 mcg total) by mouth every morning. 30 tablet 6  . isosorbide mononitrate (IMDUR) 30 MG 24 hr tablet     . nitroGLYCERIN (NITROLINGUAL) 0.4 MG/SPRAY spray Place 1 spray under the tongue every 5 (five) minutes as needed for chest pain.    . pravastatin (PRAVACHOL) 80 MG tablet Take 1 tablet (80 mg total) by mouth at bedtime. 30 tablet 11  . ranolazine  (RANEXA) 1000 MG SR tablet Take 1 tablet (1,000 mg total) by mouth 2 (two) times daily. 112 tablet 0  . Rivaroxaban (XARELTO) 20 MG TABS tablet Take 1 tablet (20 mg total) by mouth daily. 30 tablet 5  . traMADol (ULTRAM) 50 MG tablet Take 1 tablet (50 mg total) by mouth every 12 (twelve) hours as needed. 60 tablet 3   No current facility-administered medications for this visit.    Allergies:   Codeine    Social History:  The patient  reports that she quit smoking about 4 years ago. She has never used smokeless tobacco. She reports that she does not drink alcohol or use illicit drugs.   Family History:  The patient's family history includes Coronary artery disease in her father; Heart disease in her brother, father, and mother; Stroke in her mother, sister, sister, and sister.    ROS:  Please see the history of present illness.    Otherwise, review of systems positive for none.   All other systems are reviewed and negative.    PHYSICAL EXAM: VS:  BP 130/74 mmHg  Pulse 88  Resp 16  Ht _0  (1.702 m)  Wt 131 lb (59.421 kg)  BMI 20.51 kg/m2 , BMI Body mass index is 20.51 kg/(m^2).  General: Alert, oriented x3, no distress Head: no evidence of trauma, PERRL, EOMI, no exophtalmos or lid lag, no myxedema, no xanthelasma; normal ears, nose and oropharynx Neck: normal jugular venous pulsations and no hepatojugular reflux; brisk carotid pulses without delay and no carotid bruits Chest: clear to auscultation, no signs of consolidation by percussion or palpation, normal fremitus, symmetrical and full respiratory excursions Cardiovascular: normal position and quality of the apical impulse, irregular rhythm, normal first and second heart sounds, no murmurs, rubs or gallops Abdomen: no tenderness or distention, no masses by palpation, no abnormal pulsatility or arterial bruits, normal bowel sounds, no hepatosplenomegaly Extremities: no clubbing, cyanosis or edema; 2+ radial, ulnar and brachial  pulses bilaterally; 2+ right femoral, posterior tibial and dorsalis pedis pulses; 2+ left femoral, posterior tibial and dorsalis pedis pulses; no subclavian or femoral bruits Neurological: grossly nonfocal Psych: euthymic mood, full affect   EKG:  EKG is ordered today. The ekg ordered today demonstrates atrial fibrillation, QS pattern in leads V1 and V2, inverted T waves in the inferior leads and V5 V6, not much change from previous tracings   Recent Labs: 12/28/2014: ALT 12; BUN 21; Creatinine, Ser 0.94; Hemoglobin 14.9; Platelets 217.0; Potassium 4.2; Sodium 137; TSH 1.47    Lipid Panel    Component Value Date/Time   CHOL 182 12/28/2014 1546   TRIG 49.0 12/28/2014 1546   HDL 64.50 12/28/2014 1546   CHOLHDL 3 12/28/2014 1546   VLDL 9.8 12/28/2014 1546   LDLCALC 107* 12/28/2014 1546      Wt Readings from Last 3  Encounters:  03/15/15 131 lb (59.421 kg)  12/28/14 131 lb (59.421 kg)  11/07/13 131 lb 8 oz (59.648 kg)      ASSESSMENT AND PLAN:  Permanent atrial fibrillation Adequate rate control and anticoagulation. History of severe bradycardia with combined beta blockers and centrally acting calcium channel blockers in the past. Currently only on digoxin for rate control with satisfactory result. This suggests that she has substantial AV conduction disease, but pacemaker therapy has not been necessary and she has not had significant bradycardia. CHADSVasc at least 4 (age 75, gender, CAD). Continue Xarelto.  COPD - PFTs in June 2014 with MET test   CAD, RCA BMS '09, 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 low risk Myoview Feb 2013 She has known residual high-grade stenosis in the terminal left circumflex coronary artery not amenable to revascularization. Angina has resolved completely. Ranexa seems to have made a big difference. Stopping nitrates did not lead to recurrence of exertional angina   Hyperlipidemia On statin therapy. Target LDL cholesterol preferably less than 70.  Recheck    Current medicines are reviewed at length with the patient today.  The patient does not have concerns regarding medicines.  The following changes have been made:  no change  Labs/ tests ordered today include:  Orders Placed This Encounter  Procedures  . Basic metabolic panel  . Digoxin level  . EKG 12-Lead    Patient Instructions  Your physician recommends that you return for lab work in: AT [place].  Dr. Sallyanne Kuster recommends that you schedule a follow-up appointment in: 6 MONTHS.        Mikael Spray, MD  03/16/2015 6:16 PM    Tiffany Klein, MD, [facility] [phone] office [phone] pager

## 2015-03-15 NOTE — Patient Instructions (Signed)
Your physician recommends that you return for lab work in: AT [place].  Dr. Sallyanne Kuster recommends that you schedule a follow-up appointment in: 6 MONTHS.

## 2015-03-16 ENCOUNTER — Encounter: Payer: Self-pay | Admitting: Cardiovascular Disease

## 2015-06-24 DIAGNOSIS — J209 Acute bronchitis, unspecified: Secondary | ICD-10-CM | POA: Diagnosis not present

## 2015-06-24 DIAGNOSIS — J0100 Acute maxillary sinusitis, unspecified: Secondary | ICD-10-CM | POA: Diagnosis not present

## 2015-07-30 ENCOUNTER — Emergency Department (HOSPITAL_COMMUNITY): Payer: Commercial Managed Care - HMO

## 2015-07-30 ENCOUNTER — Inpatient Hospital Stay (HOSPITAL_COMMUNITY): Payer: Commercial Managed Care - HMO

## 2015-07-30 ENCOUNTER — Encounter (HOSPITAL_COMMUNITY): Payer: Self-pay | Admitting: *Deleted

## 2015-07-30 ENCOUNTER — Inpatient Hospital Stay (HOSPITAL_COMMUNITY)
Admission: EM | Admit: 2015-07-30 | Discharge: 2015-08-01 | DRG: 871 | Disposition: A | Discharge status: Home / Self Care | Payer: Commercial Managed Care - HMO | Attending: Internal Medicine | Admitting: Internal Medicine

## 2015-07-30 DIAGNOSIS — I2510 Atherosclerotic heart disease of native coronary artery without angina pectoris: Secondary | ICD-10-CM | POA: Diagnosis not present

## 2015-07-30 DIAGNOSIS — Z90710 Acquired absence of both cervix and uterus: Secondary | ICD-10-CM

## 2015-07-30 DIAGNOSIS — Z955 Presence of coronary angioplasty implant and graft: Secondary | ICD-10-CM

## 2015-07-30 DIAGNOSIS — J189 Pneumonia, unspecified organism: Secondary | ICD-10-CM | POA: Diagnosis present

## 2015-07-30 DIAGNOSIS — J440 Chronic obstructive pulmonary disease with acute lower respiratory infection: Secondary | ICD-10-CM | POA: Diagnosis not present

## 2015-07-30 DIAGNOSIS — Z7982 Long term (current) use of aspirin: Secondary | ICD-10-CM

## 2015-07-30 DIAGNOSIS — Z823 Family history of stroke: Secondary | ICD-10-CM

## 2015-07-30 DIAGNOSIS — Z681 Body mass index (BMI) 19 or less, adult: Secondary | ICD-10-CM

## 2015-07-30 DIAGNOSIS — F329 Major depressive disorder, single episode, unspecified: Secondary | ICD-10-CM | POA: Diagnosis present

## 2015-07-30 DIAGNOSIS — I482 Chronic atrial fibrillation: Secondary | ICD-10-CM | POA: Diagnosis present

## 2015-07-30 DIAGNOSIS — Z79899 Other long term (current) drug therapy: Secondary | ICD-10-CM | POA: Diagnosis not present

## 2015-07-30 DIAGNOSIS — I4821 Permanent atrial fibrillation: Secondary | ICD-10-CM | POA: Diagnosis present

## 2015-07-30 DIAGNOSIS — I110 Hypertensive heart disease with heart failure: Secondary | ICD-10-CM | POA: Diagnosis not present

## 2015-07-30 DIAGNOSIS — Z87891 Personal history of nicotine dependence: Secondary | ICD-10-CM | POA: Diagnosis not present

## 2015-07-30 DIAGNOSIS — Z7901 Long term (current) use of anticoagulants: Secondary | ICD-10-CM

## 2015-07-30 DIAGNOSIS — E785 Hyperlipidemia, unspecified: Secondary | ICD-10-CM | POA: Diagnosis present

## 2015-07-30 DIAGNOSIS — R402411 Glasgow coma scale score 13-15, in the field [EMT or ambulance]: Secondary | ICD-10-CM | POA: Diagnosis not present

## 2015-07-30 DIAGNOSIS — J449 Chronic obstructive pulmonary disease, unspecified: Secondary | ICD-10-CM | POA: Diagnosis present

## 2015-07-30 DIAGNOSIS — J439 Emphysema, unspecified: Secondary | ICD-10-CM

## 2015-07-30 DIAGNOSIS — I10 Essential (primary) hypertension: Secondary | ICD-10-CM | POA: Diagnosis present

## 2015-07-30 DIAGNOSIS — G92 Toxic encephalopathy: Secondary | ICD-10-CM | POA: Diagnosis present

## 2015-07-30 DIAGNOSIS — I5032 Chronic diastolic (congestive) heart failure: Secondary | ICD-10-CM | POA: Diagnosis not present

## 2015-07-30 DIAGNOSIS — G9340 Encephalopathy, unspecified: Secondary | ICD-10-CM | POA: Diagnosis not present

## 2015-07-30 DIAGNOSIS — J13 Pneumonia due to Streptococcus pneumoniae: Secondary | ICD-10-CM | POA: Diagnosis not present

## 2015-07-30 DIAGNOSIS — A419 Sepsis, unspecified organism: Secondary | ICD-10-CM | POA: Diagnosis not present

## 2015-07-30 DIAGNOSIS — Z8249 Family history of ischemic heart disease and other diseases of the circulatory system: Secondary | ICD-10-CM | POA: Diagnosis not present

## 2015-07-30 DIAGNOSIS — R918 Other nonspecific abnormal finding of lung field: Secondary | ICD-10-CM | POA: Diagnosis not present

## 2015-07-30 HISTORY — DX: Essential (primary) hypertension: I10

## 2015-07-30 HISTORY — DX: Chronic diastolic (congestive) heart failure: I50.32

## 2015-07-30 LAB — CBC WITH DIFFERENTIAL/PLATELET
Basophils Absolute: 0 10*3/uL (ref 0.0–0.1)
Basophils Relative: 0 %
EOS PCT: 0 %
Eosinophils Absolute: 0 10*3/uL (ref 0.0–0.7)
HEMATOCRIT: 39.5 % (ref 36.0–46.0)
Hemoglobin: 13.6 g/dL (ref 12.0–15.0)
LYMPHS PCT: 7 %
Lymphs Abs: 0.7 10*3/uL (ref 0.7–4.0)
MCH: 31.6 pg (ref 26.0–34.0)
MCHC: 34.4 g/dL (ref 30.0–36.0)
MCV: 91.9 fL (ref 78.0–100.0)
MONO ABS: 0.6 10*3/uL (ref 0.1–1.0)
MONOS PCT: 6 %
NEUTROS ABS: 8.8 10*3/uL — AB (ref 1.7–7.7)
Neutrophils Relative %: 87 %
Platelets: 179 10*3/uL (ref 150–400)
RBC: 4.3 MIL/uL (ref 3.87–5.11)
RDW: 13.9 % (ref 11.5–15.5)
WBC: 10.2 10*3/uL (ref 4.0–10.5)

## 2015-07-30 LAB — COMPREHENSIVE METABOLIC PANEL
ALBUMIN: 3.6 g/dL (ref 3.5–5.0)
ALT: 14 U/L (ref 14–54)
AST: 38 U/L (ref 15–41)
Alkaline Phosphatase: 65 U/L (ref 38–126)
Anion gap: 13 (ref 5–15)
BILIRUBIN TOTAL: 0.7 mg/dL (ref 0.3–1.2)
BUN: 12 mg/dL (ref 6–20)
CHLORIDE: 102 mmol/L (ref 101–111)
CO2: 23 mmol/L (ref 22–32)
CREATININE: 0.94 mg/dL (ref 0.44–1.00)
Calcium: 9.4 mg/dL (ref 8.9–10.3)
GFR calc Af Amer: >60 mL/min (ref >60)
GFR calc non Af Amer: 55 mL/min — ABNORMAL LOW (ref >60)
GLUCOSE: 123 mg/dL — AB (ref 65–99)
POTASSIUM: 3.6 mmol/L (ref 3.5–5.1)
Sodium: 138 mmol/L (ref 135–145)
TOTAL PROTEIN: 6.5 g/dL (ref 6.5–8.1)

## 2015-07-30 LAB — URINALYSIS, ROUTINE W REFLEX MICROSCOPIC
BILIRUBIN URINE: NEGATIVE
GLUCOSE, UA: NEGATIVE mg/dL
KETONES UR: NEGATIVE mg/dL
Leukocytes, UA: NEGATIVE
Nitrite: NEGATIVE
PH: 7 (ref 5.0–8.0)
Protein, ur: NEGATIVE mg/dL
Specific Gravity, Urine: 1.014 (ref 1.005–1.030)

## 2015-07-30 LAB — DIGOXIN LEVEL: Digoxin Level: <0.2 ng/mL — ABNORMAL LOW (ref 0.8–2.0)

## 2015-07-30 LAB — PROTIME-INR
INR: 1.25 (ref 0.00–1.49)
PROTHROMBIN TIME: 15.8 s — AB (ref 11.6–15.2)

## 2015-07-30 LAB — URINE MICROSCOPIC-ADD ON

## 2015-07-30 LAB — I-STAT TROPONIN, ED: Troponin i, poc: 0.02 ng/mL (ref 0.00–0.08)

## 2015-07-30 LAB — PROCALCITONIN: PROCALCITONIN: 1.03 ng/mL

## 2015-07-30 LAB — LACTIC ACID, PLASMA: LACTIC ACID, VENOUS: 1.1 mmol/L (ref 0.5–2.0)

## 2015-07-30 LAB — APTT: aPTT: 34 seconds (ref 24–37)

## 2015-07-30 LAB — I-STAT CG4 LACTIC ACID, ED: Lactic Acid, Venous: 1.52 mmol/L (ref 0.5–2.0)

## 2015-07-30 MED ORDER — DM-GUAIFENESIN ER 30-600 MG PO TB12
1.0000 | ORAL_TABLET | Freq: Two times a day (BID) | ORAL | Status: DC
  Administered 2015-07-31 – 2015-08-01 (×4): 1 via ORAL
  Filled 2015-07-30 (×4): qty 1

## 2015-07-30 MED ORDER — PRAVASTATIN SODIUM 40 MG PO TABS
80.0000 mg | ORAL_TABLET | Freq: Every day | ORAL | Status: DC
  Administered 2015-07-31 (×2): 80 mg via ORAL
  Filled 2015-07-30 (×2): qty 2

## 2015-07-30 MED ORDER — ISOSORBIDE MONONITRATE ER 30 MG PO TB24: 30.0000 mg | ORAL_TABLET | Freq: Every day | ORAL | Status: DC

## 2015-07-30 MED ORDER — PIPERACILLIN-TAZOBACTAM 3.375 G IVPB
3.3750 g | Freq: Three times a day (TID) | INTRAVENOUS | Status: DC
  Administered 2015-07-31 (×2): 3.375 g via INTRAVENOUS
  Filled 2015-07-30 (×3): qty 50

## 2015-07-30 MED ORDER — SODIUM CHLORIDE 0.9 % IV BOLUS (SEPSIS)
1000.0000 mL | INTRAVENOUS | Status: DC
  Administered 2015-07-30 (×2): 1000 mL via INTRAVENOUS

## 2015-07-30 MED ORDER — LEVALBUTEROL HCL 1.25 MG/0.5ML IN NEBU
1.2500 mg | INHALATION_SOLUTION | Freq: Four times a day (QID) | RESPIRATORY_TRACT | Status: DC
  Administered 2015-07-31 – 2015-08-01 (×5): 1.25 mg via RESPIRATORY_TRACT
  Filled 2015-07-30 (×8): qty 0.5

## 2015-07-30 MED ORDER — ASPIRIN EC 81 MG PO TBEC
81.0000 mg | DELAYED_RELEASE_TABLET | Freq: Every day | ORAL | Status: DC
  Administered 2015-07-31 – 2015-08-01 (×2): 81 mg via ORAL
  Filled 2015-07-30 (×2): qty 1

## 2015-07-30 MED ORDER — HYDRALAZINE HCL 20 MG/ML IJ SOLN: 5.0000 mg | INTRAMUSCULAR | Status: DC | PRN

## 2015-07-30 MED ORDER — RIVAROXABAN 20 MG PO TABS
20.0000 mg | ORAL_TABLET | Freq: Every day | ORAL | Status: DC
  Administered 2015-07-31: 20 mg via ORAL
  Filled 2015-07-30 (×2): qty 1

## 2015-07-30 MED ORDER — DIGOXIN 125 MCG PO TABS
125.0000 ug | ORAL_TABLET | Freq: Every morning | ORAL | Status: DC
  Administered 2015-07-31 – 2015-08-01 (×2): 125 ug via ORAL
  Filled 2015-07-30 (×2): qty 1

## 2015-07-30 MED ORDER — VANCOMYCIN HCL IN DEXTROSE 1-5 GM/200ML-% IV SOLN: 1000.0000 mg | Freq: Once | INTRAVENOUS | Status: DC

## 2015-07-30 MED ORDER — VITAMIN C 500 MG PO TABS
500.0000 mg | ORAL_TABLET | Freq: Every day | ORAL | Status: DC
  Administered 2015-07-31 – 2015-08-01 (×2): 500 mg via ORAL
  Filled 2015-07-30 (×2): qty 1

## 2015-07-30 MED ORDER — SODIUM CHLORIDE 0.9 % IV BOLUS (SEPSIS)
1500.0000 mL | Freq: Once | INTRAVENOUS | Status: AC
  Administered 2015-07-30: 1500 mL via INTRAVENOUS

## 2015-07-30 MED ORDER — SODIUM CHLORIDE 0.9 % IV SOLN
INTRAVENOUS | Status: DC
  Administered 2015-07-30: via INTRAVENOUS

## 2015-07-30 MED ORDER — CITALOPRAM HYDROBROMIDE 20 MG PO TABS
20.0000 mg | ORAL_TABLET | Freq: Every day | ORAL | Status: DC
  Administered 2015-07-31 – 2015-08-01 (×2): 20 mg via ORAL
  Filled 2015-07-30 (×3): qty 1

## 2015-07-30 MED ORDER — ISOSORBIDE MONONITRATE ER 30 MG PO TB24
30.0000 mg | ORAL_TABLET | Freq: Every day | ORAL | Status: DC
  Administered 2015-07-31 – 2015-08-01 (×2): 30 mg via ORAL
  Filled 2015-07-30 (×2): qty 1

## 2015-07-30 MED ORDER — PIPERACILLIN-TAZOBACTAM 3.375 G IVPB 30 MIN
3.3750 g | Freq: Once | INTRAVENOUS | Status: AC
  Administered 2015-07-30: 3.375 g via INTRAVENOUS
  Filled 2015-07-30: qty 50

## 2015-07-30 MED ORDER — RANOLAZINE ER 500 MG PO TB12
1000.0000 mg | ORAL_TABLET | Freq: Two times a day (BID) | ORAL | Status: DC
  Administered 2015-07-30 – 2015-08-01 (×4): 1000 mg via ORAL
  Filled 2015-07-30 (×6): qty 2

## 2015-07-30 MED ORDER — VANCOMYCIN HCL 500 MG IV SOLR
500.0000 mg | Freq: Two times a day (BID) | INTRAVENOUS | Status: DC
  Administered 2015-07-31: 500 mg via INTRAVENOUS
  Filled 2015-07-30 (×2): qty 500

## 2015-07-30 MED ORDER — SODIUM CHLORIDE 0.9 % IV SOLN
1250.0000 mg | Freq: Once | INTRAVENOUS | Status: AC
  Administered 2015-07-30: 1250 mg via INTRAVENOUS
  Filled 2015-07-30: qty 1250

## 2015-07-30 MED ORDER — NITROGLYCERIN 0.4 MG SL SUBL: 0.4000 mg | SUBLINGUAL_TABLET | SUBLINGUAL | Status: DC | PRN

## 2015-07-30 NOTE — Progress Notes (Signed)
Pharmacy Antibiotic Note  Tiffany Charles is a 82 y.o. female admitted on 07/30/2015 with sepsis.  Pharmacy has been consulted for vancomycin and zosyn dosing.  Plan: Vancomycin 1250 mg x 1 then 500 mg IV every 12 hours.  Goal trough 15-20 mcg/mL. Zosyn 3.375g IV q8h (4 hour infusion).  Height: 5\' 7"  (170.2 cm) Weight: 124 lb (56.246 kg) IBW/kg (Calculated) : 61.6  Temp (24hrs), Avg:100.3 F (37.9 C), Min:100.3 F (37.9 C), Max:100.3 F (37.9 C)   Recent Labs Lab 07/30/15 1716 07/30/15 1741  WBC 10.2  --   CREATININE 0.94  --   LATICACIDVEN  --  1.52    Estimated Creatinine Clearance: 40.2 mL/min (by C-G formula based on Cr of 0.94).    Allergies  Allergen Reactions  . Codeine Nausea And Vomiting    Antimicrobials this admission: Vancomycin 3/6>> Zosyn 3/6>>  Dose adjustments this admission: N/A  Microbiology results: 3/6 Blood x 2 3/6 Urine  Levester Fresh, PharmD, BCPS, [place] Clinical Pharmacist Pager [phone] 07/30/2015 7:22 PM

## 2015-07-30 NOTE — ED Notes (Signed)
Admitting MD at bedside.

## 2015-07-30 NOTE — ED Provider Notes (Signed)
CSN: [id number]     Arrival date & time 07/30/15  1649 History   First MD Initiated Contact with Patient 07/30/15 1654     Chief Complaint  Patient presents with  . Altered Mental Status  . Code Sepsis     (Consider location/radiation/quality/duration/timing/severity/associated sxs/prior Treatment) HPI Comments: Level V caveat. History is obtained per the [facility]. Patient with a history of atrial fibrillation, hyperlipidemia, coronary artery disease status post stent placement and COPD presents with altered mental status. She was noted to have a fever earlier today and increased fatigue, complaining that she wasn't feeling well. She states she's had recent burning with urination. She's had increased confusion. She was given Tylenol 1 g prior to arrival. History is limited due to her altered mental status. She is currently denying any complaints. She is on Xarelto for her atrial fibrillation.  Patient is a 82 y.o. female presenting with altered mental status.  Altered Mental Status   Past Medical History  Diagnosis Date  . Permanent atrial fibrillation ([place])   . Angina pectoris   . Hyperlipidemia   . Arthritis   . Coronary artery disease 2010    residual AV groove lesion- Medical Rx  . Stented coronary artery 2009    RCA stent  . Skin cancer Jan 2013    squamous cell- removed  . COPD (chronic obstructive pulmonary disease) ([place])   . Chronic anticoagulation     Xarelto  . Depression    Past Surgical History  Procedure Laterality Date  . Abdominal hysterectomy    . Hip surgery    . Coronary angioplasty with stent placement  2009    RCA Vision stent  .  cataracts removed    . Melanoma excision  06/06/2011    Procedure: MELANOMA EXCISION;  Surgeon: Adin Hector, MD;  Location: [facility];  Service: General;  Laterality: Left;  re excision squamous cell lesion left chest wall   . Cardioversion  06/05/2009    3 attempts, last attempt successful  . Cardiac catheterization   03/22/2009    Started medical therapy   Family History  Problem Relation Age of Onset  . Stroke Mother   . Heart disease Mother   . Coronary artery disease Father   . Heart disease Father   . Stroke Sister   . Heart disease Brother   . Stroke Sister   . Stroke Sister    Social History  Substance Use Topics  . Smoking status: Former Smoker    Quit date: 05/28/2010  . Smokeless tobacco: Never Used  . Alcohol Use: No   OB History    No data available     Review of Systems  Unable to perform ROS: Mental status change      Allergies  Codeine  Home Medications   Prior to Admission medications   Medication Sig Start Date End Date Taking? Authorizing Provider  albuterol (PROVENTIL HFA;VENTOLIN HFA) 108 (90 BASE) MCG/ACT inhaler Inhale 2 puffs into the lungs every 6 (six) hours as needed for wheezing.    Historical Provider, MD  amLODipine (NORVASC) 5 MG tablet Take 1 tablet (5 mg total) by mouth daily. 01/03/13   Mihai Croitoru, MD  aspirin EC 81 MG tablet Take 81 mg by mouth daily.    Historical Provider, MD  citalopram (CELEXA) 20 MG tablet Take 1 tablet (20mg ) by mouth daily. Please request future refills from primary care provider. 11/07/13   [name], MD  digoxin (LANOXIN) 0.125 MG tablet  Take 1 tablet (125 mcg total) by mouth every morning. 10/04/13   Mihai Croitoru, MD  isosorbide mononitrate (IMDUR) 30 MG 24 hr tablet  10/04/13   Historical Provider, MD  nitroGLYCERIN (NITROLINGUAL) 0.4 MG/SPRAY spray Place 1 spray under the tongue every 5 (five) minutes as needed for chest pain.    Historical Provider, MD  pravastatin (PRAVACHOL) 80 MG tablet Take 1 tablet (80 mg total) by mouth at bedtime. 07/14/13   Mihai Croitoru, MD  ranolazine (RANEXA) 1000 MG SR tablet Take 1 tablet (1,000 mg total) by mouth 2 (two) times daily. 07/07/13   Mihai Croitoru, MD  Rivaroxaban (XARELTO) 20 MG TABS tablet Take 1 tablet (20 mg total) by mouth daily. 03/07/13   Mihai Croitoru, MD   traMADol (ULTRAM) 50 MG tablet Take 1 tablet (50 mg total) by mouth every 12 (twelve) hours as needed. 12/28/14   [name], MD   BP 105/61 mmHg  Pulse 100  Temp(Src) 100.3 F (37.9 C) (Oral)  Resp 27  Ht 5\' 7"  (1.702 m)  Wt 124 lb (56.246 kg)  BMI 19.42 kg/m2  SpO2 96% Physical Exam  Constitutional: She appears well-developed and well-nourished.  HENT:  Head: Normocephalic and atraumatic.  Eyes: Pupils are equal, round, and reactive to light.  Neck: Normal range of motion. Neck supple.  Cardiovascular: Regular rhythm and normal heart sounds.  Tachycardia present.   Pulmonary/Chest: Effort normal and breath sounds normal. No respiratory distress. She has no wheezes. She has no rales. She exhibits no tenderness.  Abdominal: Soft. Bowel sounds are normal. There is no tenderness. There is no rebound and no guarding.  Musculoskeletal: Normal range of motion. She exhibits no edema.  No wounds or areas of cellulitis are noted  Lymphadenopathy:    She has no cervical adenopathy.  Neurological: She is alert.  Patient is alert and answers questions but is confused. She is oriented only to person. She moves all extremities symmetrically without focal deficits. No facial drooping.  Skin: Skin is warm and dry. No rash noted.  Psychiatric: She has a normal mood and affect.    ED Course  Procedures (including critical care time) Labs Review Labs Reviewed  COMPREHENSIVE METABOLIC PANEL - Abnormal; Notable for the following:    Glucose, Bld 123 (*)    GFR calc non Af Amer 55 (*)    All other components within normal limits  CBC WITH DIFFERENTIAL/PLATELET - Abnormal; Notable for the following:    Neutro Abs 8.8 (*)    All other components within normal limits  URINALYSIS, ROUTINE W REFLEX MICROSCOPIC (NOT AT [facility]) - Abnormal; Notable for the following:    Hgb urine dipstick TRACE (*)    All other components within normal limits  URINE MICROSCOPIC-ADD ON - Abnormal; Notable for the  following:    Squamous Epithelial / LPF 0-5 (*)    Bacteria, UA RARE (*)    All other components within normal limits  CULTURE, BLOOD (ROUTINE X 2)  CULTURE, BLOOD (ROUTINE X 2)  URINE CULTURE  DIGOXIN LEVEL  I-STAT CG4 LACTIC ACID, ED  I-STAT CG4 LACTIC ACID, ED  Randolm Idol, ED    Imaging Review Dg Chest Port 1 View  07/30/2015  CLINICAL DATA:  Sepsis. EXAM: PORTABLE CHEST 1 VIEW COMPARISON:  12/28/2012. FINDINGS: 1724 hours. The heart size and mediastinal contours are stable. There are lower lung volumes with new patchy bibasilar airspace opacities. No consolidation, edema or significant pleural effusion identified. The bones appear unchanged. Telemetry leads  overlie the chest. IMPRESSION: New patchy airspace opacities at both lung bases may reflect atelectasis given the lower lung volumes. Early aspiration cannot be excluded. Electronically Signed   By: Richardean Sale M.D.   On: 07/30/2015 17:49   I have personally reviewed and evaluated these images and lab results as part of my medical decision-making.   EKG Interpretation   Date/Time:  Monday July 30 2015 16:51:01 EST Ventricular Rate:  128 PR Interval:    QRS Duration: 86 QT Interval:  276 QTC Calculation: 403 R Axis:   38 Text Interpretation:  Atrial fibrillation Anteroseptal infarct, old  Repolarization abnormality, prob rate related SINCE LAST TRACING HEART  RATE HAS INCREASED ST depression laterally Confirmed by Corine Solorio  MD, Johnella Crumm  ([id number]) on 07/30/2015 5:03:45 PM      MDM   Final diagnoses:  CAP (community acquired pneumonia)    Patient presents with altered mental status, fever and tachycardia. She did meet criteria for sepsis and was treated with IV fluids and broad-spectrum antibiotics per sepsis protocol. Her chest x-ray is concerning for pneumonia. Her urinalysis is normal without evidence of infection. Her labs are otherwise unremarkable. Her lactate was normal. Her blood pressures been stable. I spoke  with Dr. Blaine Hamper, the hospitalist on-call who will admit the patient for further treatment.    Malvin Johns, MD 07/30/15 [phone]

## 2015-07-30 NOTE — ED Notes (Signed)
Pt arrives from home via [facility] c/o fever and AMS. Pt states that she has had a fever all day and has not been feeling well. Per family pt has had burning when urinating with odor. Pt also has not been acting herself. Pt had 1G of Tylenol PTA

## 2015-07-30 NOTE — H&P (Cosign Needed Addendum)
[facility] History and Physical  NALLA PURDY [id number] DOB: 05-22-1930 DOA: 07/30/2015  Referring physician: ED physician PCP: Scarlette Calico, MD  Specialists:   Chief Complaint: cough, mild SOB, AMS  HPI: Tiffany Charles is a 82 y.o. female with PMH of A fib on Xarelto, dCHF, hypertension, hyperlipidemia, COPD, depression, CAD, s/p of stent placement, skin cancer, who presents with cough, mild shortness of breath and altered mental status.  Per patient's son, patient has been having mild cough for almost 2 weeks. She has mild shortness of breath, but no chest pain. She coughs up a little amount of mucus. She did not pay attention about color of the sputum. Today patient developed fever and confusion. She has a generalized weakness. I specifically asked whether patient has unilateral weakness. Per her son, patient does not have unilateral weakness, numbness, no vision change or hearing loss. She reports burning on urination to the ED physician, but denied any symptoms of UTI to me. Patient does not have nausea, vomiting, abdominal pain or diarrhea.  In ED, patient was found to have temperature 100.3, WBC 10.2, tachypnea, tachycardia, soft blood pressure, oxygen saturation 94%, electrolytes renal function okay, negative urinalysis, lactate 1.52. Chest x-ray showed bilateral basilar infiltration.  EKG: Independently reviewed. QTC 403, ST depression in V4-V6.  Where does patient live?   At home    Can patient participate in ADLs?  Barely  Review of Systems: Could not be reviewed accurately due to altered mental status.  Allergy:  Allergies  Allergen Reactions  . Codeine Nausea And Vomiting    Past Medical History  Diagnosis Date  . Permanent atrial fibrillation ([place])   . Angina pectoris   . Hyperlipidemia   . Arthritis   . Coronary artery disease 2010    residual AV groove lesion- Medical Rx  . Stented coronary artery 2009    RCA stent  . Skin cancer Jan 2013     squamous cell- removed  . COPD (chronic obstructive pulmonary disease) ([place])   . Chronic anticoagulation     Xarelto  . Depression   . Chronic diastolic (congestive) heart failure ([place])   . Essential hypertension     Past Surgical History  Procedure Laterality Date  . Abdominal hysterectomy    . Hip surgery    . Coronary angioplasty with stent placement  2009    RCA Vision stent  .  cataracts removed    . Melanoma excision  06/06/2011    Procedure: MELANOMA EXCISION;  Surgeon: Adin Hector, MD;  Location: [facility];  Service: General;  Laterality: Left;  re excision squamous cell lesion left chest wall   . Cardioversion  06/05/2009    3 attempts, last attempt successful  . Cardiac catheterization  03/22/2009    Started medical therapy    Social History:  reports that she quit smoking about 5 years ago. She has never used smokeless tobacco. She reports that she does not drink alcohol or use illicit drugs.  Family History:  Family History  Problem Relation Age of Onset  . Stroke Mother   . Heart disease Mother   . Coronary artery disease Father   . Heart disease Father   . Stroke Sister   . Heart disease Brother   . Stroke Sister   . Stroke Sister      Prior to Admission medications   Medication Sig Start Date End Date Taking? Authorizing Provider  albuterol (PROVENTIL HFA;VENTOLIN HFA) 108 (90 BASE) MCG/ACT inhaler Inhale 2  puffs into the lungs every 6 (six) hours as needed for wheezing.    Historical Provider, MD  amLODipine (NORVASC) 5 MG tablet Take 1 tablet (5 mg total) by mouth daily. 01/03/13   Mihai Croitoru, MD  aspirin EC 81 MG tablet Take 81 mg by mouth daily.    Historical Provider, MD  citalopram (CELEXA) 20 MG tablet Take 1 tablet (50m) by mouth daily. Please request future refills from primary care provider. 11/07/13   TJanith Lima MD  digoxin (LANOXIN) 0.125 MG tablet Take 1 tablet (125 mcg total) by mouth every morning. 10/04/13   Mihai Croitoru, MD   isosorbide mononitrate (IMDUR) 30 MG 24 hr tablet  10/04/13   Historical Provider, MD  nitroGLYCERIN (NITROLINGUAL) 0.4 MG/SPRAY spray Place 1 spray under the tongue every 5 (five) minutes as needed for chest pain.    Historical Provider, MD  pravastatin (PRAVACHOL) 80 MG tablet Take 1 tablet (80 mg total) by mouth at bedtime. 07/14/13   Mihai Croitoru, MD  ranolazine (RANEXA) 1000 MG SR tablet Take 1 tablet (1,000 mg total) by mouth 2 (two) times daily. 07/07/13   Mihai Croitoru, MD  Rivaroxaban (XARELTO) 20 MG TABS tablet Take 1 tablet (20 mg total) by mouth daily. 03/07/13   Mihai Croitoru, MD  traMADol (ULTRAM) 50 MG tablet Take 1 tablet (50 mg total) by mouth every 12 (twelve) hours as needed. 12/28/14   TJanith Lima MD    Physical Exam: Filed Vitals:   07/30/15 1830 07/30/15 1915 07/30/15 1930 07/30/15 1937  BP: 101/58 96/59 111/66   Pulse: 106 78 95   Temp:    98.3 F (36.8 C)  TempSrc:      Resp: 22 25 20    Height:      Weight:      SpO2: 96% 91% 94%    General: Not in acute distress HEENT:       Eyes: PERRL, EOMI, no scleral icterus.       ENT: No discharge from the ears and nose, no pharynx injection, no tonsillar enlargement.        Neck: No JVD, no bruit, no mass felt. Heme: No neck lymph node enlargement. Cardiac: S1/S2, RRR, No murmurs, No gallops or rubs. Pulm: No rales, wheezing, rhonchi or rubs. Abd: Soft, nondistended, nontender, no rebound pain, no organomegaly, BS present. Ext: No pitting leg edema bilaterally. 2+DP/PT pulse bilaterally. Musculoskeletal: No joint deformities, No joint redness or warmth, no limitation of ROM in spin. Skin: No rashes.  Neuro: Alert, confused, oriented to person and place, but not to time, cranial nerves II-XII grossly intact, moves all extremities normally. Muscle strength 5/5 in all extremities, sensation to light touch intact.  Knee reflex 1+ bilaterally. Normal finger to nose test. Psych: Patient is not psychotic, no suicidal or  hemocidal ideation.  Labs on Admission:  Basic Metabolic Panel:  Recent Labs Lab 07/30/15 1716  NA 138  K 3.6  CL 102  CO2 23  GLUCOSE 123*  BUN 12  CREATININE 0.94  CALCIUM 9.4   Liver Function Tests:  Recent Labs Lab 07/30/15 1716  AST 38  ALT 14  ALKPHOS 65  BILITOT 0.7  PROT 6.5  ALBUMIN 3.6   No results for input(s): LIPASE, AMYLASE in the last 168 hours. No results for input(s): AMMONIA in the last 168 hours. CBC:  Recent Labs Lab 07/30/15 1716  WBC 10.2  NEUTROABS 8.8*  HGB 13.6  HCT 39.5  MCV 91.9  PLT 179  Cardiac Enzymes: No results for input(s): CKTOTAL, CKMB, CKMBINDEX, TROPONINI in the last 168 hours.  BNP (last 3 results) No results for input(s): BNP in the last 8760 hours.  ProBNP (last 3 results) No results for input(s): PROBNP in the last 8760 hours.  CBG: No results for input(s): GLUCAP in the last 168 hours.  Radiological Exams on Admission: Dg Chest Port 1 View  07/30/2015  CLINICAL DATA:  Sepsis. EXAM: PORTABLE CHEST 1 VIEW COMPARISON:  12/28/2012. FINDINGS: 1724 hours. The heart size and mediastinal contours are stable. There are lower lung volumes with new patchy bibasilar airspace opacities. No consolidation, edema or significant pleural effusion identified. The bones appear unchanged. Telemetry leads overlie the chest. IMPRESSION: New patchy airspace opacities at both lung bases may reflect atelectasis given the lower lung volumes. Early aspiration cannot be excluded. Electronically Signed   By: Richardean Sale M.D.   On: 07/30/2015 17:49    Assessment/Plan Principal Problem:   CAP (community acquired pneumonia) Active Problems:   Permanent atrial fibrillation (HCC)   COPD - PFTs in June 2014 with MET test   CAD, RCA BMS '09, 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 low risk Myoview Feb 2013   Hyperlipidemia   Chronic anticoagulation- Xarelto   Depression, major ([facility])   Sepsis ([facility])   Acute encephalopathy   Chronic  diastolic (congestive) heart failure ([facility])   Essential hypertension   CAP and sepsis to pneumonia: Patient's  productive cough, fever, shortness of breath and x-ray findings are consistent with pneumonia. Most likely due to CAP, but can not r/o aspiration pneumonia completely. Patient is septic with fever, tachycardia and tachypnea. Lactate is normal. Blood pressure soft, but hemodynamically stable currently.  - Will admit to Telemetry Bed (due to A fib) - IV Vancomycin and Zosyn to cover both CAP and possible aspiration pneumonia - SLP - Mucinex for cough  - Xopenex Neb prn for SOB - Urine legionella and S. pneumococcal antigen - Follow up blood culture x2, sputum culture and respiratory virus panel, plus Flu pcr - will get Procalcitonin and trend lactic acid level per sepsis protocol - IVF: 2.5L of NS bolus in ED, followed by 125m per hour of NS - trop x 3 and 2d echo  Acute encephalopathy: Most likely due to sepsis is secondary to pneumonia. Since patient is on Xarelto, will r/o ICH. No signs of stroke -CT-head without Contrast  COPD: Patient does not have a wheezing or rhonchi on auscultation, does not seem to have COPD exacerbation. -see above -prn Xopenex Nebz   Diastolic congestive heart failure: 2d echo on 12/27/12 showed EF 50-60 percent with grade 3 diastolic dysfunction. Patient is not taking diuretics at home. No leg edema or JVD. CHF is compensated on admission. -Continue aspirin -Check BNP  Atrial Fibrillation: CHA2DS2-VASc Score is 5, needs oral anticoagulation. Patient is on Xarelto at home. Heart rate was initially with RVR, but improved to 100-110 after 1L of NS. -continue Xarelto and digoxin -check Digoxin level  CAD: s/p of stent.  -Continue aspirin, Imdur, Ranexa and pravastatin -f/u trop x 3 and 2d echo  HLD: Last LDL was 107 on 12/28/14 -Continue home medications: Pravastatin -Check FLP  Depression: Stable, no suicidal or homicidal ideations. -Continue  home medications: Celexa  HTN: Blood pressure is soft on admission. -Hold amlodipine since patient is at risk of developing hypotension secondary to sepsis -IV hydralazine when necessary   DVT ppx: on Xarelto  Code Status: Full code Family Communication:  Yes, patient's  son at bed side Disposition Plan: Admit to inpatient   Date of Service 07/30/2015    Ivor Costa [facility] Pager [phone]  If 7PM-7AM, please contact night-coverage [email or web address] Password [email or web address] 07/30/2015, 8:03 PM

## 2015-07-31 DIAGNOSIS — J189 Pneumonia, unspecified organism: Secondary | ICD-10-CM

## 2015-07-31 LAB — LEGIONELLA ANTIGEN, URINE

## 2015-07-31 LAB — URINE CULTURE: CULTURE: NO GROWTH

## 2015-07-31 LAB — BRAIN NATRIURETIC PEPTIDE: B Natriuretic Peptide: 364.8 pg/mL — ABNORMAL HIGH (ref 0.0–100.0)

## 2015-07-31 LAB — LACTIC ACID, PLASMA
LACTIC ACID, VENOUS: 0.9 mmol/L (ref 0.5–2.0)
Lactic Acid, Venous: 0.7 mmol/L (ref 0.5–2.0)

## 2015-07-31 LAB — STREP PNEUMONIAE URINARY ANTIGEN: Strep Pneumo Urinary Antigen: POSITIVE — AB

## 2015-07-31 LAB — INFLUENZA PANEL BY PCR (TYPE A & B)
H1N1 flu by pcr: NOT DETECTED
INFLBPCR: NEGATIVE
Influenza A By PCR: NEGATIVE

## 2015-07-31 MED ORDER — PNEUMOCOCCAL VAC POLYVALENT 25 MCG/0.5ML IJ INJ: 0.5000 mL | INJECTION | INTRAMUSCULAR | Status: DC

## 2015-07-31 MED ORDER — RIVAROXABAN 15 MG PO TABS
15.0000 mg | ORAL_TABLET | Freq: Every day | ORAL | Status: DC
  Administered 2015-07-31: 15 mg via ORAL

## 2015-07-31 MED ORDER — PNEUMOCOCCAL VAC POLYVALENT 25 MCG/0.5ML IJ INJ: 0.5000 mL | INJECTION | INTRAMUSCULAR | Status: DC | PRN

## 2015-07-31 MED ORDER — CEFTRIAXONE SODIUM 1 G IJ SOLR
1.0000 g | INTRAMUSCULAR | Status: DC
  Administered 2015-07-31: 1 g via INTRAVENOUS
  Filled 2015-07-31 (×2): qty 10

## 2015-07-31 NOTE — Progress Notes (Signed)
New Admission Note  Arrival: Via stretcher Mental Orientation: Alert & oriented X 4 Telemetry: On tele Skin: No Skin issues IV: Right AC & Right wrist Pain: No c/o of pain Safety measures:  verbalized understanding. Bed in lowest position. Non-skid soocks on. Family:  No family at bedside Orders have been reviewed and implemented. Will continue to monitor.

## 2015-07-31 NOTE — Progress Notes (Signed)
Patient is up in chair

## 2015-07-31 NOTE — Evaluation (Signed)
Physical Therapy Evaluation Patient Details Name: Tiffany Charles MRN: [id number] DOB: 1930/02/26 Today's Date: 07/31/2015   History of Present Illness  82 yo female with onset of CAP and acute encephalopathy, septic and PMHx:  a-fib, COPD, angina.  Clinical Impression  Pt is getting out to walk with PT using no AD but with controlled use of balance with PT giving her touch assist.  Her plan is to see if [facility] is beneficial and if so will recommend.  Needs also to try stairs to see that she can get in and around her home when discharged.    Follow Up Recommendations Home health PT;Supervision - Intermittent    Equipment Recommendations  None recommended by PT    Recommendations for Other Services       Precautions / Restrictions Precautions Precautions: Fall Restrictions Weight Bearing Restrictions: No      Mobility  Bed Mobility Overal bed mobility: Needs Assistance Bed Mobility: Supine to Sit     Supine to sit: Min guard     General bed mobility comments: OOB in chair  Transfers Overall transfer level: Needs assistance Equipment used: Rolling walker (2 wheeled) Transfers: Sit to/from Stand Sit to Stand: Supervision Stand pivot transfers: Min guard;Min assist       General transfer comment: Pt states she feels "weak"  Ambulation/Gait Ambulation/Gait assistance: Min guard Ambulation Distance (Feet): 200 Feet Assistive device: Rolling walker (2 wheeled);1 person hand held assist Gait Pattern/deviations: Step-through pattern;Wide base of support;Decreased stride length Gait velocity: reduced Gait velocity interpretation: Below normal speed for age/gender General Gait Details: low endurance with controlled pace to allow pt to walk without overexerting  Stairs            Wheelchair Mobility    Modified Rankin (Stroke Patients Only)       Balance Overall balance assessment: Needs assistance Sitting-balance support: Feet supported Sitting balance-Leahy  Scale: Good       Standing balance-Leahy Scale: Fair Standing balance comment: good control to sit at chair for lunch                             Pertinent Vitals/Pain Pain Assessment: No/denies pain    Home Living Family/patient expects to be discharged to:: Private residence Living Arrangements: Children;Other (Comment) Available Help at Discharge: Family;Available 24 hours/day Type of Home: House Home Access: Stairs to enter Entrance Stairs-Rails: Right Entrance Stairs-Number of Steps: 3 Home Layout: One level Home Equipment: Shower seat      Prior Function Level of Independence: Independent               Hand Dominance        Extremity/Trunk Assessment   Upper Extremity Assessment: Generalized weakness           Lower Extremity Assessment: Generalized weakness      Cervical / Trunk Assessment: Normal  Communication   Communication: No difficulties  Cognition Arousal/Alertness: Awake/alert Behavior During Therapy: WFL for tasks assessed/performed Overall Cognitive Status: No family/caregiver present to determine baseline cognitive functioning                      General Comments General comments (skin integrity, edema, etc.): Pt  is expecting to go home from hospital and is demonstrating fairly good control that should allow this by end of stay    Exercises        Assessment/Plan    PT Assessment Patient needs continued PT  services  PT Diagnosis Difficulty walking   PT Problem List Decreased strength;Decreased range of motion;Decreased activity tolerance;Decreased balance;Decreased mobility;Decreased coordination;Decreased knowledge of use of DME  PT Treatment Interventions DME instruction;Gait training;Stair training;Functional mobility training;Therapeutic activities;Therapeutic exercise;Balance training;Neuromuscular re-education;Patient/family education   PT Goals (Current goals can be found in the Care Plan section)  Acute Rehab PT Goals Patient Stated Goal: to go ome PT Goal Formulation: With patient Time For Goal Achievement: 08/14/15 Potential to Achieve Goals: Good    Frequency Min 2X/week   Barriers to discharge Decreased caregiver support (son is out of the house at times)      Co-evaluation               End of Session Equipment Utilized During Treatment: Gait belt Activity Tolerance: Patient tolerated treatment well Patient left: in chair;with call bell/phone within reach;Other (comment) (OT arrived to see pt) Nurse Communication: Mobility status         Time: [id number] PT Time Calculation (min) (ACUTE ONLY): 24 min   Charges:   PT Evaluation $PT Eval Low Complexity: 1 Procedure PT Treatments $Gait Training: 8-22 mins   PT G Codes:        Ramond Dial 08-09-15, 2:22 PM   Mee Hives, PT MS Acute Rehab Dept. Number: [phone] [id number] and [place] [phone]

## 2015-07-31 NOTE — Progress Notes (Signed)
Dr. Candiss Norse on his rounds gave RN verbal order to discontinue droplet precautions and doesn't need to wait for respiratory panel.

## 2015-07-31 NOTE — Progress Notes (Signed)
Utilization review completed. Aspasia Rude, RN, BSN. 

## 2015-07-31 NOTE — Progress Notes (Signed)
Occupational Therapy Evaluation Patient Details Name: Tiffany Charles MRN: [id number] DOB: 1929/08/05 Today's Date: 07/31/2015    History of Present Illness 82 yo female with onset of CAP and acute encephalopathy, septic and PMHx:  a-fib, COPD, angina.   Clinical Impression   PTA, pt independent with ADL and mobility and lived with her son. Pt appears close to baseline cognitively, but son not available to confirm. Pt states she feels "weak". Encourage pt to ambulate with nsg staff. Will follow up in am to address energy conservation and home safety to facilitate safe D/C home with initial 24/7 S of son.     Follow Up Recommendations  No OT follow up;Supervision/Assistance - 24 hour (initially)    Equipment Recommendations  None recommended by OT    Recommendations for Other Services       Precautions / Restrictions Precautions Precautions: Fall Restrictions Weight Bearing Restrictions: No      Mobility Bed Mobility               General bed mobility comments: OOB in chair  Transfers Overall transfer level: Needs assistance   Transfers: Sit to/from Stand Sit to Stand: Supervision         General transfer comment: Pt states she feels "weak"    Balance Overall balance assessment: Needs assistance   Sitting balance-Leahy Scale: Good       Standing balance-Leahy Scale: Fair                              ADL Overall ADL's : Needs assistance/impaired     Grooming: Set up;Standing   Upper Body Bathing: Set up;Standing   Lower Body Bathing: Set up;Supervison/ safety;Sit to/from stand   Upper Body Dressing : Set up;Sitting   Lower Body Dressing: Supervision/safety;Set up;Sit to/from stand   Toilet Transfer: Min guard;Ambulation   Toileting- Clothing Manipulation and Hygiene: Modified independent       Functional mobility during ADLs: Min guard       Vision     Perception     Praxis      Pertinent Vitals/Pain Pain  Assessment: No/denies pain     Hand Dominance     Extremity/Trunk Assessment Upper Extremity Assessment Upper Extremity Assessment: Generalized weakness   Lower Extremity Assessment Lower Extremity Assessment: Generalized weakness   Cervical / Trunk Assessment Cervical / Trunk Assessment: Normal   Communication Communication Communication: No difficulties   Cognition Arousal/Alertness: Awake/alert Behavior During Therapy: WFL for tasks assessed/performed Overall Cognitive Status: No family/caregiver present to determine baseline cognitive functioning  appears close to baseline. Pt able to verbalize " i don't know what was wrong with me yesterday, i was talking out of my head"                   General Comments       Exercises       Shoulder Instructions      Home Living Family/patient expects to be discharged to:: Private residence Living Arrangements: Children;Other (Comment) Available Help at Discharge: Family;Available 24 hours/day Type of Home: House Home Access: Stairs to enter [employer] of Steps: 3   Home Layout: One level     Bathroom Shower/Tub: Occupational psychologist: Standard Bathroom Accessibility: Yes How Accessible: Accessible via walker Home Equipment: Shower seat          Prior Functioning/Environment Level of Independence: Independent  OT Diagnosis: Generalized weakness   OT Problem List: Decreased strength;Decreased activity tolerance;Impaired balance (sitting and/or standing);Decreased safety awareness   OT Treatment/Interventions: Self-care/ADL training;Energy conservation;Therapeutic activities;Patient/family education    OT Goals(Current goals can be found in the care plan section) Acute Rehab OT Goals Patient Stated Goal: to go ome OT Goal Formulation: With patient Time For Goal Achievement: 08/07/15 Potential to Achieve Goals: Good ADL Goals Pt Will Transfer to Toilet: with  modified independence;ambulating Pt Will Perform Toileting - Clothing Manipulation and hygiene: Independently;sit to/from stand Additional ADL Goal #1: Pt will verbalize understanding of 3 energy conservation techiques for ADL  OT Frequency: Min 2X/week   Barriers to D/C:            Co-evaluation              End of Session Nurse Communication: Mobility status  Activity Tolerance: Patient tolerated treatment well Patient left: in chair;with call bell/phone within reach   Time: 1206-1223 OT Time Calculation (min): 17 min Charges:  OT General Charges $OT Visit: 1 Procedure OT Evaluation $OT Eval Moderate Complexity: 1 Procedure G-Codes:    Bailen Geffre,HILLARY Aug 18, 2015, 2:12 PM   [facility], OTR/L  [phone] 2015/08/18

## 2015-07-31 NOTE — Progress Notes (Signed)
Patient Demographics:    Tiffany Charles, is a 82 y.o. female, DOB - March 09, 1930, [id number]  Admit date - 07/30/2015   Admitting Physician Ivor Costa, MD  Outpatient Primary MD for the patient is Scarlette Calico, MD  LOS - 1   Chief Complaint  Patient presents with  . Altered Mental Status  . Code Sepsis        Subjective:    Tiffany Charles today has, No headache, No chest pain, No abdominal pain - No Nausea, No new weakness tingling or numbness, Improved cough and shortness of breath.   Assessment  & Plan :     1.Strep pneumoniae CAP - urinary antigen positive, tapered down to Rocephin, stable lactic acid and pro-calcitonin, blood cultures pending, influenza negative, has been hydrated, stop IV fluids, no oxygen requirement, she feels better. Continue supportive care and IV antibiotics for another 24 hours. If stable discharge in the morning.  2. Toxic encephalopathy. #1 above. Resolved. No headache, head CT nonacute, no focal deficits. Close to baseline.  3. COPD. No wheezing. Supportive care.  4. Chronic diastolic dysfunction. EF 50-60% in 2014. Currently compensated.  5. Underlying CAD status post stent placement in the past. Continue aspirin, Imdur, Ranexa and statin for secondary prevention. Troponin negative. Repeat echo pending.  6. Chronic atrial fibrillation. [place] vasc 2 score of 5. Currently on xaralto and digoxin, pharmacy monitoring. Xaralto dose has been adjusted for renal function. Goal is rate controlled.  7. Dyslipidemia. On statin continue.  8. Depression. On Celexa. Stable.  9. Essential hypertension. Blood pressure borderline. Hold Norvasc. As needed IV hydralazine.    Code Status : Full  Family Communication  : None present  Disposition Plan  : Stay patient likely home  in the morning  Consults  :  None  Procedures  :   CT head nonacute  DVT Prophylaxis  :  Xaralto  Lab Results  Component Value Date   PLT 179 07/30/2015    Inpatient Medications  Scheduled Meds: . aspirin EC  81 mg Oral Daily  . cefTRIAXone (ROCEPHIN)  IV  1 g Intravenous Q24H  . citalopram  20 mg Oral Daily  . dextromethorphan-guaiFENesin  1 tablet Oral BID  . digoxin  125 mcg Oral q morning - 10a  . isosorbide mononitrate  30 mg Oral Daily  . levalbuterol  1.25 mg Nebulization 4 times per day  . pravastatin  80 mg Oral QHS  . ranolazine  1,000 mg Oral BID  . rivaroxaban  15 mg Oral QAC supper  . vitamin C  500 mg Oral Daily   Continuous Infusions:  PRN Meds:.hydrALAZINE, nitroGLYCERIN, pneumococcal 23 valent vaccine  Antibiotics  :     Anti-infectives    Start     Dose/Rate Route Frequency Ordered Stop   07/31/15 1800  cefTRIAXone (ROCEPHIN) 1 g in dextrose 5 % 50 mL IVPB     1 g 100 mL/hr over 30 Minutes Intravenous Every 24 hours 07/31/15 1057     07/31/15 0800  vancomycin (VANCOCIN) 500 mg in sodium chloride 0.9 % 100 mL IVPB  Status:  Discontinued     500 mg 100 mL/hr over 60 Minutes Intravenous Every 12 hours 07/30/15 1921 07/31/15 1057   07/31/15 0200  piperacillin-tazobactam (ZOSYN) IVPB 3.375 g  Status:  Discontinued     3.375 g 12.5 mL/hr over 240 Minutes Intravenous Every 8 hours 07/30/15 1921 07/31/15 1057   07/30/15 1715  piperacillin-tazobactam (ZOSYN) IVPB 3.375 g     3.375 g 100 mL/hr over 30 Minutes Intravenous  Once 07/30/15 1705 07/30/15 1849   07/30/15 1715  vancomycin (VANCOCIN) IVPB 1000 mg/200 mL premix  Status:  Discontinued     1,000 mg 200 mL/hr over 60 Minutes Intravenous  Once 07/30/15 1705 07/30/15 1713   07/30/15 1715  vancomycin (VANCOCIN) 1,250 mg in sodium chloride 0.9 % 250 mL IVPB     1,250 mg 166.7 mL/hr over 90 Minutes Intravenous  Once 07/30/15 1713 07/30/15 2041        Objective:   Filed Vitals:   07/30/15 2337  07/31/15 0600 07/31/15 0811 07/31/15 0847  BP: 108/60 114/69 115/57   Pulse: 78 97 87   Temp: 98.2 F (36.8 C) 97.9 F (36.6 C) 98 F (36.7 C)   TempSrc: Oral Oral Oral   Resp: 21 20 19    Height:      Weight: 58.968 kg (130 lb)     SpO2: 98% 97% 97% 97%    Wt Readings from Last 3 Encounters:  07/30/15 58.968 kg (130 lb)  03/15/15 59.421 kg (131 lb)  12/28/14 59.421 kg (131 lb)     Intake/Output Summary (Last 24 hours) at 07/31/15 1124 Last data filed at 07/31/15 [id number]  Gross per 24 hour  Intake   1620 ml  Output    200 ml  Net   1420 ml     Physical Exam  Awake Alert, Oriented X 3, No new F.N deficits, Normal affect [place].AT,PERRAL Supple Neck,No JVD, No cervical lymphadenopathy appriciated.  Symmetrical Chest wall movement, Good air movement bilaterally, CTAB RRR,No Gallops,Rubs or new Murmurs, No Parasternal Heave +ve B.Sounds, Abd Soft, No tenderness, No organomegaly appriciated, No rebound - guarding or rigidity. No Cyanosis, Clubbing or edema, No new Rash or bruise       Data Review:   Micro Results Recent Results (from the past 240 hour(s))  Urine culture     Status: None (Preliminary result)   Collection Time: 07/30/15  6:11 PM  Result Value Ref Range Status   Specimen Description URINE, CATHETERIZED  Final   Special Requests NONE  Final   Culture NO GROWTH < 24 HOURS  Final   Report Status PENDING  Incomplete    Radiology Reports Ct Head Wo Contrast  07/30/2015  CLINICAL DATA:  Acute encephalopathy EXAM: CT HEAD WITHOUT CONTRAST TECHNIQUE: Contiguous axial images were obtained from the base of the skull through the vertex without intravenous contrast. COMPARISON:  None. FINDINGS: Moderate atrophy.  Negative for hydrocephalus Patchy white matter hypodensity compatible chronic microvascular ischemia. No acute infarct. Negative for hemorrhage or mass lesion. No midline shift. Sinusitis with air-fluid level in the left sphenoid sinus and diffuse mucosal  thickening. Right mastoid sinus effusion. No acute skull abnormality Multiple small gas bubbles in the right cavernous sinus and right infratemporal fossa. This is most consistent with venous gas from vena puncture. IMPRESSION: Atrophy and chronic microvascular ischemia.  No acute abnormality. Sinusitis with air-fluid level. Electronically Signed   By: Franchot Gallo M.D.   On: 07/30/2015 20:45   Dg Chest Port 1 View  07/30/2015  CLINICAL DATA:  Sepsis. EXAM: PORTABLE CHEST 1 VIEW COMPARISON:  12/28/2012. FINDINGS: 1724 hours. The heart size and mediastinal contours are stable. There are  lower lung volumes with new patchy bibasilar airspace opacities. No consolidation, edema or significant pleural effusion identified. The bones appear unchanged. Telemetry leads overlie the chest. IMPRESSION: New patchy airspace opacities at both lung bases may reflect atelectasis given the lower lung volumes. Early aspiration cannot be excluded. Electronically Signed   By: Richardean Sale M.D.   On: 07/30/2015 17:49     CBC  Recent Labs Lab 07/30/15 1716  WBC 10.2  HGB 13.6  HCT 39.5  PLT 179  MCV 91.9  MCH 31.6  MCHC 34.4  RDW 13.9  LYMPHSABS 0.7  MONOABS 0.6  EOSABS 0.0  BASOSABS 0.0    Chemistries   Recent Labs Lab 07/30/15 1716  NA 138  K 3.6  CL 102  CO2 23  GLUCOSE 123*  BUN 12  CREATININE 0.94  CALCIUM 9.4  AST 38  ALT 14  ALKPHOS 65  BILITOT 0.7   ------------------------------------------------------------------------------------------------------------------ No results for input(s): CHOL, HDL, LDLCALC, TRIG, CHOLHDL, LDLDIRECT in the last 72 hours.  No results found for: HGBA1C ------------------------------------------------------------------------------------------------------------------ No results for input(s): TSH, T4TOTAL, T3FREE, THYROIDAB in the last 72 hours.  Invalid input(s):  FREET3 ------------------------------------------------------------------------------------------------------------------ No results for input(s): VITAMINB12, FOLATE, FERRITIN, TIBC, IRON, RETICCTPCT in the last 72 hours.  Coagulation profile  Recent Labs Lab 07/30/15 2058  INR 1.25    No results for input(s): DDIMER in the last 72 hours.  Cardiac Enzymes No results for input(s): CKMB, TROPONINI, MYOGLOBIN in the last 168 hours.  Invalid input(s): CK ------------------------------------------------------------------------------------------------------------------    Component Value Date/Time   BNP 364.8* 07/31/2015 0147    Time Spent in minutes  35   SINGH,PRASHANT K M.D on 07/31/2015 at 11:24 AM  Between 7am to 7pm - Pager - [phone]  After 7pm go to [email or web address] - password [facility]  [facility] -  Office  [phone]

## 2015-07-31 NOTE — Evaluation (Signed)
Clinical/Bedside Swallow Evaluation Patient Details  Name: Tiffany Charles MRN: [id number] Date of Birth: 1930/04/11  Today's Date: 07/31/2015 Time: SLP Start Time (ACUTE ONLY): [id number] SLP Stop Time (ACUTE ONLY): 1512 SLP Time Calculation (min) (ACUTE ONLY): 13 min  Past Medical History:  Past Medical History  Diagnosis Date  . Permanent atrial fibrillation ([place])   . Angina pectoris   . Hyperlipidemia   . Arthritis   . Coronary artery disease 2010    residual AV groove lesion- Medical Rx  . Stented coronary artery 2009    RCA stent  . Skin cancer Jan 2013    squamous cell- removed  . COPD (chronic obstructive pulmonary disease) ([place])   . Chronic anticoagulation     Xarelto  . Depression   . Chronic diastolic (congestive) heart failure ([place])   . Essential hypertension    Past Surgical History:  Past Surgical History  Procedure Laterality Date  . Abdominal hysterectomy    . Hip surgery    . Coronary angioplasty with stent placement  2009    RCA Vision stent  .  cataracts removed    . Melanoma excision  06/06/2011    Procedure: MELANOMA EXCISION;  Surgeon: Adin Hector, MD;  Location: [facility] ORS;  Service: General;  Laterality: Left;  re excision squamous cell lesion left chest wall   . Cardioversion  06/05/2009    3 attempts, last attempt successful  . Cardiac catheterization  03/22/2009    Started medical therapy   HPI:  82 yo female with onset of CAP and acute encephalopathy, septic and PMHx: a-fib, COPD, angina.   Assessment / Plan / Recommendation Clinical Impression  Pt's swallow appears WFL with no overt signs of aspiration observed. Will continue regular diet textures and thin liquids. SLP to sign off.    Aspiration Risk  No limitations    Diet Recommendation Regular;Thin liquid   Liquid Administration via: Cup;Straw Medication Administration: Whole meds with liquid Supervision: Patient able to self feed;Intermittent supervision to cue for compensatory  strategies Compensations: Slow rate;Small sips/bites Postural Changes: Seated upright at 90 degrees    Other  Recommendations Oral Care Recommendations: Oral care BID   Follow up Recommendations  None    Frequency and Duration            Prognosis        Swallow Study   General HPI: 82 yo female with onset of CAP and acute encephalopathy, septic and PMHx: a-fib, COPD, angina. Type of Study: Bedside Swallow Evaluation Previous Swallow Assessment: none in chart Diet Prior to this Study: Regular;Thin liquids Temperature Spikes Noted: Yes (100.3) Respiratory Status: Room air History of Recent Intubation: No Behavior/Cognition: Alert;Cooperative;Pleasant mood Oral Cavity Assessment: Within Functional Limits Oral Care Completed by SLP: No Oral Cavity - Dentition: Dentures, top;Dentures, bottom Vision: Functional for self-feeding Self-Feeding Abilities: Able to feed self Patient Positioning: Upright in bed Baseline Vocal Quality: Normal    Oral/Motor/Sensory Function Overall Oral Motor/Sensory Function: Within functional limits   Ice Chips Ice chips: Not tested   Thin Liquid Thin Liquid: Within functional limits Presentation: Cup;Self Fed;Straw    Nectar Thick Nectar Thick Liquid: Not tested   Honey Thick Honey Thick Liquid: Not tested   Puree Puree: Within functional limits Presentation: Self Fed;Spoon   Solid   GO   Solid: Within functional limits Presentation: Self Fed        Germain Osgood, M.A. CCC-SLP [phone]  Germain Osgood 07/31/2015,3:22 PM

## 2015-08-01 LAB — RESPIRATORY VIRUS PANEL
Adenovirus: NEGATIVE
INFLUENZA B 1: NEGATIVE
Influenza A: NEGATIVE
METAPNEUMOVIRUS: NEGATIVE
PARAINFLUENZA 1 A: NEGATIVE
PARAINFLUENZA 2 A: NEGATIVE
PARAINFLUENZA 3 A: NEGATIVE
RHINOVIRUS: NEGATIVE
Respiratory Syncytial Virus A: NEGATIVE
Respiratory Syncytial Virus B: NEGATIVE

## 2015-08-01 MED ORDER — RIVAROXABAN 15 MG PO TABS: 15.0000 mg | ORAL_TABLET | Freq: Every day | ORAL | Status: DC

## 2015-08-01 MED ORDER — LEVALBUTEROL HCL 1.25 MG/0.5ML IN NEBU: 1.2500 mg | INHALATION_SOLUTION | Freq: Two times a day (BID) | RESPIRATORY_TRACT | Status: DC

## 2015-08-01 MED ORDER — CEFPODOXIME PROXETIL 200 MG PO TABS: 200.0000 mg | ORAL_TABLET | Freq: Two times a day (BID) | ORAL | Status: DC

## 2015-08-01 NOTE — Discharge Instructions (Signed)
Follow with Primary MD Thomas Jones, MD in 7 days  ° °Get CBC, CMP, 2 view Chest X ray checked  by Primary MD next visit.  ° ° °Activity: As tolerated with Full fall precautions use walker/cane & assistance as needed ° ° °Disposition Home  ° ° °Diet: Heart Healthy  ° °For Heart failure patients - Check your Weight same time everyday, if you gain over 2 pounds, or you develop in leg swelling, experience more shortness of breath or chest pain, call your Primary MD immediately. Follow Cardiac Low Salt Diet and 1.5 lit/day fluid restriction. ° ° °On your next visit with your primary care physician please Get Medicines reviewed and adjusted. ° ° °Please request your Prim.MD to go over all Hospital Tests and Procedure/Radiological results at the follow up, please get all Hospital records sent to your Prim MD by signing hospital release before you go home. ° ° °If you experience worsening of your admission symptoms, develop shortness of breath, life threatening emergency, suicidal or homicidal thoughts you must seek medical attention immediately by calling 911 or calling your MD immediately  if symptoms less severe. ° °You Must read complete instructions/literature along with all the possible adverse reactions/side effects for all the Medicines you take and that have been prescribed to you. Take any new Medicines after you have completely understood and accpet all the possible adverse reactions/side effects.  ° °Do not drive, operating heavy machinery, perform activities at heights, swimming or participation in water activities or provide baby sitting services if your were admitted for syncope or siezures until you have seen by Primary MD or a Neurologist and advised to do so again. ° °Do not drive when taking Pain medications.  ° ° °Do not take more than prescribed Pain, Sleep and Anxiety Medications ° °Special Instructions: If you have smoked or chewed Tobacco  in the last 2 yrs please stop smoking, stop any regular  Alcohol  and or any Recreational drug use. ° °Wear Seat belts while driving. ° ° °Please note ° °You were cared for by a hospitalist during your hospital stay. If you have any questions about your discharge medications or the care you received while you were in the hospital after you are discharged, you can call the unit and asked to speak with the hospitalist on call if the hospitalist that took care of you is not available. Once you are discharged, your primary care physician will handle any further medical issues. Please note that NO REFILLS for any discharge medications will be authorized once you are discharged, as it is imperative that you return to your primary care physician (or establish a relationship with a primary care physician if you do not have one) for your aftercare needs so that they can reassess your need for medications and monitor your lab values. ° °

## 2015-08-01 NOTE — Progress Notes (Signed)
Patient Discharge: Disposition: Patient discharged to home. Education: Reviewed medications, prescriptions, follow-up appointments and discharge instructions, understood and acknowledged. IV: Discontinued before discharge. Telemetry: Discontinued before discharge.  CCMD notified. Transportation: Patient transported in w/c out of the unit accompanied by the volunteer Belongings: Patient took all her belongings with her.

## 2015-08-01 NOTE — Discharge Summary (Signed)
Tiffany Charles, is a 82 y.o. female  DOB 10-Jul-1929  MRN [id number].  Admission date:  07/30/2015  Admitting Physician  Ivor Costa, MD  Discharge Date:  08/01/2015   Primary MD  Tiffany Calico, MD  Recommendations for primary care physician for things to follow:   Check CBC, BMP and a 2 view chest x-ray next visit  Admission Diagnosis  CAP (community acquired pneumonia) [J18.9] Acute encephalopathy [G93.40] Essential hypertension [X48] Chronic diastolic (congestive) heart failure ([place]) [I50.32]   Discharge Diagnosis  CAP (community acquired pneumonia) [J18.9] Acute encephalopathy [G93.40] Essential hypertension [A16] Chronic diastolic (congestive) heart failure ([place]) [I50.32]    Principal Problem:   CAP (community acquired pneumonia) Active Problems:   Permanent atrial fibrillation ([place])   COPD - PFTs in June 2014 with MET test   CAD, RCA BMS '09, 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 low risk Myoview Feb 2013   Hyperlipidemia   Chronic anticoagulation- Xarelto   Depression, major (HCC)   Sepsis (HCC)   Acute encephalopathy   Chronic diastolic (congestive) heart failure (HCC)   Essential hypertension      Past Medical History  Diagnosis Date  . Permanent atrial fibrillation ([place])   . Angina pectoris   . Hyperlipidemia   . Arthritis   . Coronary artery disease 2010    residual AV groove lesion- Medical Rx  . Stented coronary artery 2009    RCA stent  . Skin cancer Jan 2013    squamous cell- removed  . COPD (chronic obstructive pulmonary disease) ([place])   . Chronic anticoagulation     Xarelto  . Depression   . Chronic diastolic (congestive) heart failure ([place])   . Essential hypertension     Past Surgical History  Procedure Laterality Date  . Abdominal hysterectomy    . Hip surgery    .  Coronary angioplasty with stent placement  2009    RCA Vision stent  .  cataracts removed    . Melanoma excision  06/06/2011    Procedure: MELANOMA EXCISION;  Surgeon: Adin Hector, MD;  Location: [facility];  Service: General;  Laterality: Left;  re excision squamous cell lesion left chest wall   . Cardioversion  06/05/2009    3 attempts, last attempt successful  . Cardiac catheterization  03/22/2009    Started medical therapy       HPI  from the history and physical done on the day of admission:    Tiffany Charles is a 82 y.o. female with PMH of A fib on Xarelto, dCHF, hypertension, hyperlipidemia, COPD, depression, CAD, s/p of stent placement, skin cancer, who presents with cough, mild shortness of breath and altered mental status.  Per patient's son, patient has been having mild cough for almost 2 weeks. She has mild shortness of breath, but no chest pain. She coughs up a little amount of mucus. She did not pay attention about color of the sputum. Today patient developed fever and confusion. She has a generalized weakness. I specifically asked  whether patient has unilateral weakness. Per her son, patient does not have unilateral weakness, numbness, no vision change or hearing loss. She reports burning on urination to the ED physician, but denied any symptoms of UTI to me. Patient does not have nausea, vomiting, abdominal pain or diarrhea.  In ED, patient was found to have temperature 100.3, WBC 10.2, tachypnea, tachycardia, soft blood pressure, oxygen saturation 94%, electrolytes renal function okay, negative urinalysis, lactate 1.52. Chest x-ray showed bilateral basilar infiltration.      Hospital Course:     1.Strep pneumoniae CAP - urinary antigen positive, tapered down to Rocephin, stable lactic acid and pro-calcitonin, blood cultures pending, influenza negative, has been hydrated, stopped IV fluids, no oxygen requirement, she feels better and close to her baseline, eager to go home,  ambulated without any problems will be discharged on 7 more days of oral Vantin.   2. Toxic encephalopathy. #1 above. Resolved. No headache, head CT nonacute, no focal deficits. Close to baseline.  3. COPD. No wheezing. Supportive care.  4. Chronic diastolic dysfunction. EF 50-60% in 2014. Currently compensated.  5. Underlying CAD status post stent placement in the past. Continue aspirin, Imdur, Ranexa and statin for secondary prevention. Troponin negative.   6. Chronic atrial fibrillation. [place] vasc 2 score of 5. Currently on xaralto and digoxin, pharmacy monitoring. Xaralto dose has been adjusted for renal function. Goal is rate control.  7. Dyslipidemia. On statin continue.  8. Depression. On Celexa. Stable.  9. Essential hypertension. Blood pressure stable continue home medications.     Discharge Condition: Fair  Follow UP  Follow-up Information    Follow up with Tiffany Calico, MD. Schedule an appointment as soon as possible for a visit in 1 week.   Specialty:  Internal Medicine   Contact information:   [address] [address] [phone]        Consults obtained - None  Diet and Activity recommendation: See Discharge Instructions below  Discharge Instructions       Discharge Instructions    Diet - low sodium heart healthy    Complete by:  As directed      Discharge instructions    Complete by:  As directed   Follow with Primary MD Tiffany Calico, MD in 7 days   Get CBC, CMP, 2 view Chest X ray checked  by Primary MD next visit.    Activity: As tolerated with Full fall precautions use walker/cane & assistance as needed   Disposition Home     Diet:   Heart Healthy  .  For Heart failure patients - Check your Weight same time everyday, if you gain over 2 pounds, or you develop in leg swelling, experience more shortness of breath or chest pain, call your Primary MD immediately. Follow Cardiac Low Salt Diet and 1.5 lit/day fluid  restriction.   On your next visit with your primary care physician please Get Medicines reviewed and adjusted.   Please request your Prim.MD to go over all Hospital Tests and Procedure/Radiological results at the follow up, please get all Hospital records sent to your Prim MD by signing hospital release before you go home.   If you experience worsening of your admission symptoms, develop shortness of breath, life threatening emergency, suicidal or homicidal thoughts you must seek medical attention immediately by calling 911 or calling your MD immediately  if symptoms less severe.  You Must read complete instructions/literature along with all the possible adverse reactions/side effects for all  the Medicines you take and that have been prescribed to you. Take any new Medicines after you have completely understood and accpet all the possible adverse reactions/side effects.   Do not drive, operating heavy machinery, perform activities at heights, swimming or participation in water activities or provide baby sitting services if your were admitted for syncope or siezures until you have seen by Primary MD or a Neurologist and advised to do so again.  Do not drive when taking Pain medications.    Do not take more than prescribed Pain, Sleep and Anxiety Medications  Special Instructions: If you have smoked or chewed Tobacco  in the last 2 yrs please stop smoking, stop any regular Alcohol  and or any Recreational drug use.  Wear Seat belts while driving.   Please note  You were cared for by a hospitalist during your hospital stay. If you have any questions about your discharge medications or the care you received while you were in the hospital after you are discharged, you can call the unit and asked to speak with the hospitalist on call if the hospitalist that took care of you is not available. Once you are discharged, your primary care physician will handle any further medical issues. Please note  that NO REFILLS for any discharge medications will be authorized once you are discharged, as it is imperative that you return to your primary care physician (or establish a relationship with a primary care physician if you do not have one) for your aftercare needs so that they can reassess your need for medications and monitor your lab values.     Increase activity slowly    Complete by:  As directed              Discharge Medications       Medication List    TAKE these medications        albuterol 108 (90 Base) MCG/ACT inhaler  Commonly known as:  PROVENTIL HFA;VENTOLIN HFA  Inhale 2 puffs into the lungs every 6 (six) hours as needed for wheezing.     amLODipine 5 MG tablet  Commonly known as:  NORVASC  Take 1 tablet (5 mg total) by mouth daily.     aspirin EC 81 MG tablet  Take 81 mg by mouth daily.     cefpodoxime 200 MG tablet  Commonly known as:  VANTIN  Take 1 tablet (200 mg total) by mouth 2 (two) times daily.     citalopram 20 MG tablet  Commonly known as:  CELEXA  Take 1 tablet (73m) by mouth daily. Please request future refills from primary care provider.     digoxin 0.125 MG tablet  Commonly known as:  LANOXIN  Take 1 tablet (125 mcg total) by mouth every morning.     isosorbide mononitrate 30 MG 24 hr tablet  Commonly known as:  IMDUR  Take 30 mg by mouth daily.     nitroGLYCERIN 0.4 MG/SPRAY spray  Commonly known as:  NITROLINGUAL  Place 1 spray under the tongue every 5 (five) minutes as needed for chest pain.     pravastatin 80 MG tablet  Commonly known as:  PRAVACHOL  Take 1 tablet (80 mg total) by mouth at bedtime.     ranolazine 1000 MG SR tablet  Commonly known as:  RANEXA  Take 1 tablet (1,000 mg total) by mouth 2 (two) times daily.     Rivaroxaban 15 MG Tabs tablet  Commonly known as:  XARELTO  Take  1 tablet (15 mg total) by mouth daily before supper.     traMADol 50 MG tablet  Commonly known as:  ULTRAM  Take 1 tablet (50 mg total)  by mouth every 12 (twelve) hours as needed.     VITAMIN C PO  Take 1 tablet by mouth daily as needed (for immune support).        Major procedures and Radiology Reports - PLEASE review detailed and final reports for all details, in brief -       Ct Head Wo Contrast  07/30/2015  CLINICAL DATA:  Acute encephalopathy EXAM: CT HEAD WITHOUT CONTRAST TECHNIQUE: Contiguous axial images were obtained from the base of the skull through the vertex without intravenous contrast. COMPARISON:  None. FINDINGS: Moderate atrophy.  Negative for hydrocephalus Patchy white matter hypodensity compatible chronic microvascular ischemia. No acute infarct. Negative for hemorrhage or mass lesion. No midline shift. Sinusitis with air-fluid level in the left sphenoid sinus and diffuse mucosal thickening. Right mastoid sinus effusion. No acute skull abnormality Multiple small gas bubbles in the right cavernous sinus and right infratemporal fossa. This is most consistent with venous gas from vena puncture. IMPRESSION: Atrophy and chronic microvascular ischemia.  No acute abnormality. Sinusitis with air-fluid level. Electronically Signed   By: Franchot Gallo M.D.   On: 07/30/2015 20:45   Dg Chest Port 1 View  07/30/2015  CLINICAL DATA:  Sepsis. EXAM: PORTABLE CHEST 1 VIEW COMPARISON:  12/28/2012. FINDINGS: 1724 hours. The heart size and mediastinal contours are stable. There are lower lung volumes with new patchy bibasilar airspace opacities. No consolidation, edema or significant pleural effusion identified. The bones appear unchanged. Telemetry leads overlie the chest. IMPRESSION: New patchy airspace opacities at both lung bases may reflect atelectasis given the lower lung volumes. Early aspiration cannot be excluded. Electronically Signed   By: Richardean Sale M.D.   On: 07/30/2015 17:49    Micro Results      Recent Results (from the past 240 hour(s))  Culture, blood (routine x 2)     Status: None (Preliminary result)    Collection Time: 07/30/15  5:16 PM  Result Value Ref Range Status   Specimen Description BLOOD RIGHT WRIST  Final   Special Requests BOTTLES DRAWN AEROBIC AND ANAEROBIC 5CC  Final   Culture NO GROWTH < 24 HOURS  Final   Report Status PENDING  Incomplete  Culture, blood (routine x 2)     Status: None (Preliminary result)   Collection Time: 07/30/15  5:20 PM  Result Value Ref Range Status   Specimen Description BLOOD LEFT ANTECUBITAL  Final   Special Requests BOTTLES DRAWN AEROBIC AND ANAEROBIC 5CC  Final   Culture NO GROWTH < 24 HOURS  Final   Report Status PENDING  Incomplete  Urine culture     Status: None   Collection Time: 07/30/15  6:11 PM  Result Value Ref Range Status   Specimen Description URINE, CATHETERIZED  Final   Special Requests NONE  Final   Culture NO GROWTH 1 DAY  Final   Report Status 07/31/2015 FINAL  Final  Respiratory virus panel     Status: None   Collection Time: 07/30/15  9:21 PM  Result Value Ref Range Status   Respiratory Syncytial Virus A Negative Negative Final   Respiratory Syncytial Virus B Negative Negative Final   Influenza A Negative Negative Final   Influenza B Negative Negative Final   Parainfluenza 1 Negative Negative Final   Parainfluenza 2 Negative Negative Final  Parainfluenza 3 Negative Negative Final   Metapneumovirus Negative Negative Final   Rhinovirus Negative Negative Final   Adenovirus Negative Negative Final    Comment: (NOTE) Performed At: [facility] [address] [place], [place] [id number] Lindon Romp MD KX:[id number]    Today   Subjective    Annamarie Yamaguchi today has no headache,no chest abdominal pain,no new weakness tingling or numbness, feels much better wants to go home today.     Objective   Blood pressure 149/80, pulse 97, temperature 97.7 F (36.5 C), temperature source Oral, resp. rate 18, height 5' 7"  (1.702 m), weight 58.968 kg (130 lb), SpO2 99 %.   Intake/Output Summary (Last 24 hours) at  08/01/15 1102 Last data filed at 08/01/15 0820  Gross per 24 hour  Intake    840 ml  Output      0 ml  Net    840 ml    Exam Awake Alert, Oriented x 3, No new F.N deficits, Normal affect [place].AT,PERRAL Supple Neck,No JVD, No cervical lymphadenopathy appriciated.  Symmetrical Chest wall movement, Good air movement bilaterally, CTAB RRR,No Gallops,Rubs or new Murmurs, No Parasternal Heave +ve B.Sounds, Abd Soft, Non tender, No organomegaly appriciated, No rebound -guarding or rigidity. No Cyanosis, Clubbing or edema, No new Rash or bruise   Data Review   CBC w Diff: Lab Results  Component Value Date   WBC 10.2 07/30/2015   HGB 13.6 07/30/2015   HCT 39.5 07/30/2015   PLT 179 07/30/2015   LYMPHOPCT 7 07/30/2015   MONOPCT 6 07/30/2015   EOSPCT 0 07/30/2015   BASOPCT 0 07/30/2015    CMP: Lab Results  Component Value Date   NA 138 07/30/2015   K 3.6 07/30/2015   CL 102 07/30/2015   CO2 23 07/30/2015   BUN 12 07/30/2015   CREATININE 0.94 07/30/2015   CREATININE 0.87 07/11/2013   PROT 6.5 07/30/2015   ALBUMIN 3.6 07/30/2015   BILITOT 0.7 07/30/2015   ALKPHOS 65 07/30/2015   AST 38 07/30/2015   ALT 14 07/30/2015  .   Total Time in preparing paper work, data evaluation and todays exam - 35 minutes  Thurnell Lose M.D on 08/01/2015 at 11:02 AM  [facility]   Office  [phone]

## 2015-08-02 ENCOUNTER — Telehealth: Payer: Self-pay | Admitting: *Deleted

## 2015-08-02 NOTE — Telephone Encounter (Signed)
Transition Care Management Follow-up Telephone Call   Date discharged? 08/01/15   How have you been since you were released from the hospital? Pt states she is feeling pretty good   Do you understand why you were in the hospital? YES   Do you understand the discharge instructions? YES   Where were you discharged to? Home   Items Reviewed:  Medications reviewed: YES  Allergies reviewed: YES  Dietary changes reviewed: YES  Referrals reviewed: No referral needed   Functional Questionnaire:   Activities of Daily Living (ADLs):   She states she are independent in the following: ambulation, bathing and hygiene, feeding, continence, grooming, toileting and dressing States she require assistance with the following: ambulation sometimes   Any transportation issues/concerns?: NO   Any patient concerns? YES   Confirmed importance and date/time of follow-up visits scheduled YES, appt 08/16/15  Provider Appointment booked with Dr. Ronnald Ramp  Confirmed with patient if condition begins to worsen call PCP or go to the ER.  Patient was given the office number and encouraged to call back with question or concerns.  : YES

## 2015-08-04 LAB — CULTURE, BLOOD (ROUTINE X 2)
CULTURE: NO GROWTH
CULTURE: NO GROWTH

## 2015-08-06 ENCOUNTER — Telehealth: Payer: Self-pay | Admitting: Internal Medicine

## 2015-08-06 NOTE — Telephone Encounter (Signed)
I am willing to work her in

## 2015-08-06 NOTE — Telephone Encounter (Signed)
Patient has hospital fu for 1/23.  [facility] wanted her to be seen sooner.  The soonest I could get her in is on Friday with Dr. Jenny Reichmann.  Patient would prefer to see Dr. Ronnald Ramp.  Would like to know if she can be worked in anywhere.

## 2015-08-07 NOTE — Telephone Encounter (Signed)
Appointment had already been made.  I have informed patient

## 2015-08-09 ENCOUNTER — Ambulatory Visit (INDEPENDENT_AMBULATORY_CARE_PROVIDER_SITE_OTHER): Payer: Commercial Managed Care - HMO | Admitting: Internal Medicine

## 2015-08-09 ENCOUNTER — Ambulatory Visit (INDEPENDENT_AMBULATORY_CARE_PROVIDER_SITE_OTHER)
Admission: RE | Admit: 2015-08-09 | Discharge: 2015-08-09 | Disposition: A | Discharge status: Home / Self Care | Payer: Commercial Managed Care - HMO | Source: Ambulatory Visit | Attending: Internal Medicine | Admitting: Internal Medicine

## 2015-08-09 ENCOUNTER — Encounter: Payer: Self-pay | Admitting: Internal Medicine

## 2015-08-09 VITALS — BP 122/82 | HR 76 | Temp 97.6°F | Resp 16 | Wt 125.0 lb

## 2015-08-09 DIAGNOSIS — J189 Pneumonia, unspecified organism: Secondary | ICD-10-CM

## 2015-08-09 DIAGNOSIS — I482 Chronic atrial fibrillation: Secondary | ICD-10-CM | POA: Diagnosis not present

## 2015-08-09 DIAGNOSIS — J410 Simple chronic bronchitis: Secondary | ICD-10-CM

## 2015-08-09 DIAGNOSIS — I4821 Permanent atrial fibrillation: Secondary | ICD-10-CM

## 2015-08-09 MED ORDER — TIOTROPIUM BROMIDE MONOHYDRATE 2.5 MCG/ACT IN AERS: 2.0000 | INHALATION_SPRAY | Freq: Every day | RESPIRATORY_TRACT | Status: DC

## 2015-08-09 NOTE — Progress Notes (Signed)
Subjective:  Patient ID: Tiffany Charles, female    DOB: Nov 18, 1929  Age: 82 y.o. MRN: [id number]  CC: Cough   HPI KEONTE STANDFIELD presents for a follow-up after a recent admission for pneumonia with sepsis. She tells me her cough is much better. She coughs a few times throughout the day and says it is productive of thick but clear phlegm. She has mild shortness of breath but no wheezing. She denies hemoptysis, chest pain, fever, chills, night sweats. She has used the albuterol inhaler a few times but does not find that it's helpful. She has completed the course of antibiotics with no side effects or complications.  Outpatient Prescriptions Prior to Visit  Medication Sig Dispense Refill  . albuterol (PROVENTIL HFA;VENTOLIN HFA) 108 (90 BASE) MCG/ACT inhaler Inhale 2 puffs into the lungs every 6 (six) hours as needed for wheezing.    Marland Kitchen amLODipine (NORVASC) 5 MG tablet Take 1 tablet (5 mg total) by mouth daily. 90 tablet 3  . Ascorbic Acid (VITAMIN C PO) Take 1 tablet by mouth daily as needed (for immune support).     Marland Kitchen aspirin EC 81 MG tablet Take 81 mg by mouth daily.    . citalopram (CELEXA) 20 MG tablet Take 1 tablet (20mg ) by mouth daily. Please request future refills from primary care provider. 90 tablet 3  . digoxin (LANOXIN) 0.125 MG tablet Take 1 tablet (125 mcg total) by mouth every morning. 30 tablet 6  . isosorbide mononitrate (IMDUR) 30 MG 24 hr tablet Take 30 mg by mouth daily.     . nitroGLYCERIN (NITROLINGUAL) 0.4 MG/SPRAY spray Place 1 spray under the tongue every 5 (five) minutes as needed for chest pain.    . pravastatin (PRAVACHOL) 80 MG tablet Take 1 tablet (80 mg total) by mouth at bedtime. 30 tablet 11  . ranolazine (RANEXA) 1000 MG SR tablet Take 1 tablet (1,000 mg total) by mouth 2 (two) times daily. 112 tablet 0  . Rivaroxaban (XARELTO) 15 MG TABS tablet Take 1 tablet (15 mg total) by mouth daily before supper. 30 tablet 0  . traMADol (ULTRAM) 50 MG tablet Take 1 tablet  (50 mg total) by mouth every 12 (twelve) hours as needed. 60 tablet 3  . cefpodoxime (VANTIN) 200 MG tablet Take 1 tablet (200 mg total) by mouth 2 (two) times daily. 14 tablet 0   No facility-administered medications prior to visit.    ROS Review of Systems  Constitutional: Negative.  Negative for fever, chills, diaphoresis, appetite change and fatigue.  HENT: Negative.  Negative for congestion, facial swelling, sinus pressure, sore throat, trouble swallowing and voice change.   Eyes: Negative.   Respiratory: Positive for cough. Negative for apnea, choking, chest tightness, shortness of breath, wheezing and stridor.   Cardiovascular: Negative.  Negative for chest pain, palpitations and leg swelling.  Gastrointestinal: Negative.  Negative for nausea, vomiting, abdominal pain, diarrhea, constipation and blood in stool.  Endocrine: Negative.   Genitourinary: Negative.   Musculoskeletal: Negative.  Negative for myalgias, back pain, joint swelling and arthralgias.  Skin: Negative.  Negative for color change, pallor and rash.  Allergic/Immunologic: Negative.   Neurological: Negative.  Negative for dizziness.  Hematological: Negative.  Negative for adenopathy. Does not bruise/bleed easily.  Psychiatric/Behavioral: Negative.     Objective:  BP 122/82 mmHg  Pulse 76  Temp(Src) 97.6 F (36.4 C) (Oral)  Resp 16  Wt 125 lb (56.7 kg)  SpO2 96%  BP Readings from Last 3 Encounters:  08/09/15 122/82  08/01/15 149/80  03/15/15 130/74    Wt Readings from Last 3 Encounters:  08/09/15 125 lb (56.7 kg)  07/30/15 130 lb (58.968 kg)  03/15/15 131 lb (59.421 kg)    Physical Exam  Constitutional: She is oriented to person, place, and time. She appears well-developed and well-nourished. No distress.  HENT:  Mouth/Throat: Oropharynx is clear and moist. No oropharyngeal exudate.  Eyes: Conjunctivae are normal. Right eye exhibits no discharge. Left eye exhibits no discharge. No scleral icterus.    Neck: Normal range of motion. Neck supple. No JVD present. No tracheal deviation present. No thyromegaly present.  Cardiovascular: Normal rate, normal heart sounds and intact distal pulses.  An irregularly irregular rhythm present. Exam reveals no gallop and no friction rub.   No murmur heard. Pulmonary/Chest: Effort normal. No stridor. No respiratory distress. She has no decreased breath sounds. She has no wheezes. She has rhonchi in the right lower field. She has rales in the right lower field and the left lower field. She exhibits no tenderness.  Abdominal: Soft. Bowel sounds are normal. She exhibits no distension and no mass. There is no tenderness. There is no rebound and no guarding.  Musculoskeletal: Normal range of motion. She exhibits no edema or tenderness.  Lymphadenopathy:    She has no cervical adenopathy.  Neurological: She is oriented to person, place, and time.  Skin: Skin is warm and dry. No rash noted. She is not diaphoretic. No erythema. No pallor.  Vitals reviewed.   Lab Results  Component Value Date   WBC 10.2 07/30/2015   HGB 13.6 07/30/2015   HCT 39.5 07/30/2015   PLT 179 07/30/2015   GLUCOSE 123* 07/30/2015   CHOL 182 12/28/2014   TRIG 49.0 12/28/2014   HDL 64.50 12/28/2014   LDLCALC 107* 12/28/2014   ALT 14 07/30/2015   AST 38 07/30/2015   NA 138 07/30/2015   K 3.6 07/30/2015   CL 102 07/30/2015   CREATININE 0.94 07/30/2015   BUN 12 07/30/2015   CO2 23 07/30/2015   TSH 1.47 12/28/2014   INR 1.25 07/30/2015    Ct Head Wo Contrast  07/30/2015  CLINICAL DATA:  Acute encephalopathy EXAM: CT HEAD WITHOUT CONTRAST TECHNIQUE: Contiguous axial images were obtained from the base of the skull through the vertex without intravenous contrast. COMPARISON:  None. FINDINGS: Moderate atrophy.  Negative for hydrocephalus Patchy white matter hypodensity compatible chronic microvascular ischemia. No acute infarct. Negative for hemorrhage or mass lesion. No midline shift.  Sinusitis with air-fluid level in the left sphenoid sinus and diffuse mucosal thickening. Right mastoid sinus effusion. No acute skull abnormality Multiple small gas bubbles in the right cavernous sinus and right infratemporal fossa. This is most consistent with venous gas from vena puncture. IMPRESSION: Atrophy and chronic microvascular ischemia.  No acute abnormality. Sinusitis with air-fluid level. Electronically Signed   By: Franchot Gallo M.D.   On: 07/30/2015 20:45   Dg Chest Port 1 View  07/30/2015  CLINICAL DATA:  Sepsis. EXAM: PORTABLE CHEST 1 VIEW COMPARISON:  12/28/2012. FINDINGS: 1724 hours. The heart size and mediastinal contours are stable. There are lower lung volumes with new patchy bibasilar airspace opacities. No consolidation, edema or significant pleural effusion identified. The bones appear unchanged. Telemetry leads overlie the chest. IMPRESSION: New patchy airspace opacities at both lung bases may reflect atelectasis given the lower lung volumes. Early aspiration cannot be excluded. Electronically Signed   By: Richardean Sale M.D.   On: 07/30/2015 17:49  Assessment & Plan:   Marisa was seen today for cough.  Diagnoses and all orders for this visit:  Permanent atrial fibrillation ([place])- she has good rate control with the calcium channel blocker, will continue anticoagulation with Xarelto  CAP (community acquired pneumonia)- based on her symptoms and chest x-ray this has resolved, she has completed antibiotic regimen. -     DG Chest 2 View; Future  Simple chronic bronchitis ([place])- she is symptomatic with respect to this so I have asked her to start using Spiriva -     Tiotropium Bromide Monohydrate (SPIRIVA RESPIMAT) 2.5 MCG/ACT AERS; Inhale 2 puffs into the lungs daily.   I have discontinued Ms. Ciresi's cefpodoxime. I am also having her start on Tiotropium Bromide Monohydrate. Additionally, I am having her maintain her nitroGLYCERIN, aspirin EC, albuterol, amLODipine,  ranolazine, pravastatin, digoxin, isosorbide mononitrate, citalopram, traMADol, Ascorbic Acid (VITAMIN C PO), and Rivaroxaban.  Meds ordered this encounter  Medications  . Tiotropium Bromide Monohydrate (SPIRIVA RESPIMAT) 2.5 MCG/ACT AERS    Sig: Inhale 2 puffs into the lungs daily.    Dispense:  4 g    Refill:  11     Follow-up: Return in about 2 months (around 10/09/2015).  Scarlette Calico, MD

## 2015-08-09 NOTE — Patient Instructions (Signed)
Chronic Obstructive Pulmonary Disease Chronic obstructive pulmonary disease (COPD) is a common lung condition in which airflow from the lungs is limited. COPD is a general term that can be used to describe many different lung problems that limit airflow, including both chronic bronchitis and emphysema. If you have COPD, your lung function will probably never return to normal, but there are measures you can take to improve lung function and make yourself feel better. CAUSES   Smoking (common).  Exposure to secondhand smoke.  Genetic problems.  Chronic inflammatory lung diseases or recurrent infections. SYMPTOMS  Shortness of breath, especially with physical activity.  Deep, persistent (chronic) cough with a large amount of thick mucus.  Wheezing.  Rapid breaths (tachypnea).  Gray or bluish discoloration (cyanosis) of the skin, especially in your fingers, toes, or lips.  Fatigue.  Weight loss.  Frequent infections or episodes when breathing symptoms become much worse (exacerbations).  Chest tightness. DIAGNOSIS Your health care provider will take a medical history and perform a physical examination to diagnose COPD. Additional tests for COPD may include:  Lung (pulmonary) function tests.  Chest X-ray.  CT scan.  Blood tests. TREATMENT  Treatment for COPD may include:  Inhaler and nebulizer medicines. These help manage the symptoms of COPD and make your breathing more comfortable.  Supplemental oxygen. Supplemental oxygen is only helpful if you have a low oxygen level in your blood.  Exercise and physical activity. These are beneficial for nearly all people with COPD.  Lung surgery or transplant.  Nutrition therapy to gain weight, if you are underweight.  Pulmonary rehabilitation. This may involve working with a team of health care providers and specialists, such as respiratory, occupational, and physical therapists. HOME CARE INSTRUCTIONS  Take all medicines  (inhaled or pills) as directed by your health care provider.  Avoid over-the-counter medicines or cough syrups that dry up your airway (such as antihistamines) and slow down the elimination of secretions unless instructed otherwise by your health care provider.  If you are a smoker, the most important thing that you can do is stop smoking. Continuing to smoke will cause further lung damage and breathing trouble. Ask your health care provider for help with quitting smoking. He or she can direct you to community resources or hospitals that provide support.  Avoid exposure to irritants such as smoke, chemicals, and fumes that aggravate your breathing.  Use oxygen therapy and pulmonary rehabilitation if directed by your health care provider. If you require home oxygen therapy, ask your health care provider whether you should purchase a pulse oximeter to measure your oxygen level at home.  Avoid contact with individuals who have a contagious illness.  Avoid extreme temperature and humidity changes.  Eat healthy foods. Eating smaller, more frequent meals and resting before meals may help you maintain your strength.  Stay active, but balance activity with periods of rest. Exercise and physical activity will help you maintain your ability to do things you want to do.  Preventing infection and hospitalization is very important when you have COPD. Make sure to receive all the vaccines your health care provider recommends, especially the pneumococcal and influenza vaccines. Ask your health care provider whether you need a pneumonia vaccine.  Learn and use relaxation techniques to manage stress.  Learn and use controlled breathing techniques as directed by your health care provider. Controlled breathing techniques include:  Pursed lip breathing. Start by breathing in (inhaling) through your nose for 1 second. Then, purse your lips as if you were   going to whistle and breathe out (exhale) through the  pursed lips for 2 seconds.  Diaphragmatic breathing. Start by putting one hand on your abdomen just above your waist. Inhale slowly through your nose. The hand on your abdomen should move out. Then purse your lips and exhale slowly. You should be able to feel the hand on your abdomen moving in as you exhale.  Learn and use controlled coughing to clear mucus from your lungs. Controlled coughing is a series of short, progressive coughs. The steps of controlled coughing are: 1. Lean your head slightly forward. 2. Breathe in deeply using diaphragmatic breathing. 3. Try to hold your breath for 3 seconds. 4. Keep your mouth slightly open while coughing twice. 5. Spit any mucus out into a tissue. 6. Rest and repeat the steps once or twice as needed. SEEK MEDICAL CARE IF:  You are coughing up more mucus than usual.  There is a change in the color or thickness of your mucus.  Your breathing is more labored than usual.  Your breathing is faster than usual. SEEK IMMEDIATE MEDICAL CARE IF:  You have shortness of breath while you are resting.  You have shortness of breath that prevents you from:  Being able to talk.  Performing your usual physical activities.  You have chest pain lasting longer than 5 minutes.  Your skin color is more cyanotic than usual.  You measure low oxygen saturations for longer than 5 minutes with a pulse oximeter. MAKE SURE YOU:  Understand these instructions.  Will watch your condition.  Will get help right away if you are not doing well or get worse.   This information is not intended to replace advice given to you by your health care provider. Make sure you discuss any questions you have with your health care provider.   Document Released: 02/19/2005 Document Revised: 06/02/2014 Document Reviewed: 01/06/2013 Elsevier Interactive Patient Education 2016 [employer]  

## 2015-08-09 NOTE — Progress Notes (Signed)
Pre visit review using our clinic review tool, if applicable. No additional management support is needed unless otherwise documented below in the visit note. 

## 2015-08-16 ENCOUNTER — Inpatient Hospital Stay: Payer: Commercial Managed Care - HMO | Admitting: Internal Medicine

## 2015-08-30 ENCOUNTER — Ambulatory Visit (INDEPENDENT_AMBULATORY_CARE_PROVIDER_SITE_OTHER): Payer: Commercial Managed Care - HMO | Admitting: Internal Medicine

## 2015-08-30 ENCOUNTER — Telehealth: Payer: Self-pay | Admitting: Internal Medicine

## 2015-08-30 ENCOUNTER — Encounter: Payer: Self-pay | Admitting: Internal Medicine

## 2015-08-30 VITALS — BP 120/60 | HR 92 | Temp 98.2°F | Resp 16 | Ht 65.0 in | Wt 128.0 lb

## 2015-08-30 DIAGNOSIS — I10 Essential (primary) hypertension: Secondary | ICD-10-CM

## 2015-08-30 DIAGNOSIS — R2241 Localized swelling, mass and lump, right lower limb: Secondary | ICD-10-CM | POA: Diagnosis not present

## 2015-08-30 DIAGNOSIS — R2240 Localized swelling, mass and lump, unspecified lower limb: Secondary | ICD-10-CM | POA: Insufficient documentation

## 2015-08-30 NOTE — Telephone Encounter (Signed)
Patient Name: Tiffany Charles  DOB: Jul 28, 1929    Initial Comment caller states his mother has a large sore on her leg- wants an appt today   Nurse Assessment  Nurse: Raphael Gibney, RN, Vanita Ingles Date/Time (Eastern Time): 08/30/2015 10:58:37 AM  Confirm and document reason for call. If symptomatic, describe symptoms. You must click the next button to save text entered. ---Caller states his mother has a large sore on her right leg. Has had sore for 3 days. Sore is red and inflamed.  Has the patient traveled out of the country within the last 30 days? ---No  Does the patient have any new or worsening symptoms? ---Yes  Will a triage be completed? ---Yes  Related visit to physician within the last 2 weeks? ---No  Does the PT have any chronic conditions? (i.e. diabetes, asthma, etc.) ---No  Is this a behavioral health or substance abuse call? ---No     Guidelines    Guideline Title Affirmed Question Affirmed Notes  Sores Large sore (> 1 inch or 2.5 cm across)    Final Disposition User   See Physician within 24 Hours [place], Therapist, sports, [place]    Comments  No appts available today at [facility] he is not available to bring her tomorrow and wants her seen today. He does not want to go to urgent care or another office. Please call son back regarding appt.   Referrals  GO TO FACILITY REFUSED   Disagree/Comply: Disagree  Disagree/Comply Reason: Disagree with instructions

## 2015-08-30 NOTE — Progress Notes (Signed)
Subjective:  Patient ID: Tiffany Charles, female    DOB: 01-09-1930  Age: 82 y.o. MRN: [id number]  CC: Rash   HPI Tiffany Charles presents for a skin lesion behind her right ankle. She says it has been there for several months and is getting bigger. It does not bother her in the sense that it doesn't bleed or drain.  Outpatient Prescriptions Prior to Visit  Medication Sig Dispense Refill  . albuterol (PROVENTIL HFA;VENTOLIN HFA) 108 (90 BASE) MCG/ACT inhaler Inhale 2 puffs into the lungs every 6 (six) hours as needed for wheezing.    Marland Kitchen amLODipine (NORVASC) 5 MG tablet Take 1 tablet (5 mg total) by mouth daily. 90 tablet 3  . Ascorbic Acid (VITAMIN C PO) Take 1 tablet by mouth daily as needed (for immune support).     Marland Kitchen aspirin EC 81 MG tablet Take 81 mg by mouth daily.    . citalopram (CELEXA) 20 MG tablet Take 1 tablet (20mg ) by mouth daily. Please request future refills from primary care provider. 90 tablet 3  . digoxin (LANOXIN) 0.125 MG tablet Take 1 tablet (125 mcg total) by mouth every morning. 30 tablet 6  . isosorbide mononitrate (IMDUR) 30 MG 24 hr tablet Take 30 mg by mouth daily.     . nitroGLYCERIN (NITROLINGUAL) 0.4 MG/SPRAY spray Place 1 spray under the tongue every 5 (five) minutes as needed for chest pain.    . pravastatin (PRAVACHOL) 80 MG tablet Take 1 tablet (80 mg total) by mouth at bedtime. 30 tablet 11  . ranolazine (RANEXA) 1000 MG SR tablet Take 1 tablet (1,000 mg total) by mouth 2 (two) times daily. 112 tablet 0  . Rivaroxaban (XARELTO) 15 MG TABS tablet Take 1 tablet (15 mg total) by mouth daily before supper. 30 tablet 0  . Tiotropium Bromide Monohydrate (SPIRIVA RESPIMAT) 2.5 MCG/ACT AERS Inhale 2 puffs into the lungs daily. 4 g 11  . traMADol (ULTRAM) 50 MG tablet Take 1 tablet (50 mg total) by mouth every 12 (twelve) hours as needed. 60 tablet 3   No facility-administered medications prior to visit.    ROS Review of Systems  All other systems reviewed and  are negative.   Objective:  BP 120/60 mmHg  Pulse 92  Temp(Src) 98.2 F (36.8 C) (Oral)  Resp 16  Ht 5\' 5"  (1.651 m)  Wt 128 lb (58.06 kg)  BMI 21.30 kg/m2  SpO2 97%  BP Readings from Last 3 Encounters:  08/30/15 120/60  08/09/15 122/82  08/01/15 149/80    Wt Readings from Last 3 Encounters:  08/30/15 128 lb (58.06 kg)  08/09/15 125 lb (56.7 kg)  07/30/15 130 lb (58.968 kg)    Physical Exam  Skin:       Lab Results  Component Value Date   WBC 10.2 07/30/2015   HGB 13.6 07/30/2015   HCT 39.5 07/30/2015   PLT 179 07/30/2015   GLUCOSE 123* 07/30/2015   CHOL 182 12/28/2014   TRIG 49.0 12/28/2014   HDL 64.50 12/28/2014   LDLCALC 107* 12/28/2014   ALT 14 07/30/2015   AST 38 07/30/2015   NA 138 07/30/2015   K 3.6 07/30/2015   CL 102 07/30/2015   CREATININE 0.94 07/30/2015   BUN 12 07/30/2015   CO2 23 07/30/2015   TSH 1.47 12/28/2014   INR 1.25 07/30/2015    Dg Chest 2 View  08/10/2015  CLINICAL DATA:  Follow up pneumonia. Congestion. Ex-smoker with history of COPD. EXAM: CHEST  2 VIEW COMPARISON:  Radiographs 07/30/2015 and 12/28/2012. FINDINGS: The heart size and mediastinal contours are stable. The heart size is at the upper limits of normal. There is interval improved aeration of the lung bases with mild residual interstitial prominence which appears chronic. No airspace disease, edema or significant pleural effusion is present. There are stable mild degenerative changes within the spine. IMPRESSION: Interval clearing of previously demonstrated patchy bibasilar airspace opacities. No acute findings. Electronically Signed   By: Richardean Sale M.D.   On: 08/10/2015 09:01    Assessment & Plan:   Terran was seen today for rash.  Diagnoses and all orders for this visit:  Mass of skin of ankle, right- this is a large lesion that is suspicious for cancer, I've asked her to see general surgery to consider biopsy and/or excision. -     Ambulatory referral to  General Surgery  Essential hypertension   I am having Ms. Guerrant maintain her nitroGLYCERIN, aspirin EC, albuterol, amLODipine, ranolazine, pravastatin, digoxin, isosorbide mononitrate, citalopram, traMADol, Ascorbic Acid (VITAMIN C PO), Rivaroxaban, and Tiotropium Bromide Monohydrate.  No orders of the defined types were placed in this encounter.     Follow-up: Return in about 3 months (around 11/29/2015).  Scarlette Calico, MD

## 2015-08-30 NOTE — Telephone Encounter (Signed)
Called and left message at the only phone number on this note----we have had 1:00pm cancellation on dr Ronnald Ramp schedule today---was trying to see if patient could come at 1:00 today with dr Ronnald Ramp if patient calls back before that appointment is taken by someone else

## 2015-08-30 NOTE — Patient Instructions (Signed)
Hypertension Hypertension, commonly called high blood pressure, is when the force of blood pumping through your arteries is too strong. Your arteries are the blood vessels that carry blood from your heart throughout your body. A blood pressure reading consists of a higher number over a lower number, such as 110/72. The higher number (systolic) is the pressure inside your arteries when your heart pumps. The lower number (diastolic) is the pressure inside your arteries when your heart relaxes. Ideally you want your blood pressure below 120/80. Hypertension forces your heart to work harder to pump blood. Your arteries may become narrow or stiff. Having untreated or uncontrolled hypertension can cause heart attack, stroke, kidney disease, and other problems. RISK FACTORS Some risk factors for high blood pressure are controllable. Others are not.  Risk factors you cannot control include:   Race. You may be at higher risk if you are African American.  Age. Risk increases with age.  Gender. Men are at higher risk than women before age 45 years. After age 65, women are at higher risk than men. Risk factors you can control include:  Not getting enough exercise or physical activity.  Being overweight.  Getting too much fat, sugar, calories, or salt in your diet.  Drinking too much alcohol. SIGNS AND SYMPTOMS Hypertension does not usually cause signs or symptoms. Extremely high blood pressure (hypertensive crisis) may cause headache, anxiety, shortness of breath, and nosebleed. DIAGNOSIS To check if you have hypertension, your health care provider will measure your blood pressure while you are seated, with your arm held at the level of your heart. It should be measured at least twice using the same arm. Certain conditions can cause a difference in blood pressure between your right and left arms. A blood pressure reading that is higher than normal on one occasion does not mean that you need treatment. If  it is not clear whether you have high blood pressure, you may be asked to return on a different day to have your blood pressure checked again. Or, you may be asked to monitor your blood pressure at home for 1 or more weeks. TREATMENT Treating high blood pressure includes making lifestyle changes and possibly taking medicine. Living a healthy lifestyle can help lower high blood pressure. You may need to change some of your habits. Lifestyle changes may include:  Following the DASH diet. This diet is high in fruits, vegetables, and whole grains. It is low in salt, red meat, and added sugars.  Keep your sodium intake below 2,300 mg per day.  Getting at least 30-45 minutes of aerobic exercise at least 4 times per week.  Losing weight if necessary.  Not smoking.  Limiting alcoholic beverages.  Learning ways to reduce stress. Your health care provider may prescribe medicine if lifestyle changes are not enough to get your blood pressure under control, and if one of the following is true:  You are 18-59 years of age and your systolic blood pressure is above 140.  You are 60 years of age or older, and your systolic blood pressure is above 150.  Your diastolic blood pressure is above 90.  You have diabetes, and your systolic blood pressure is over 140 or your diastolic blood pressure is over 90.  You have kidney disease and your blood pressure is above 140/90.  You have heart disease and your blood pressure is above 140/90. Your personal target blood pressure may vary depending on your medical conditions, your age, and other factors. HOME CARE INSTRUCTIONS    Have your blood pressure rechecked as directed by your health care provider.   Take medicines only as directed by your health care provider. Follow the directions carefully. Blood pressure medicines must be taken as prescribed. The medicine does not work as well when you skip doses. Skipping doses also puts you at risk for  problems.  Do not smoke.   Monitor your blood pressure at home as directed by your health care provider. SEEK MEDICAL CARE IF:   You think you are having a reaction to medicines taken.  You have recurrent headaches or feel dizzy.  You have swelling in your ankles.  You have trouble with your vision. SEEK IMMEDIATE MEDICAL CARE IF:  You develop a severe headache or confusion.  You have unusual weakness, numbness, or feel faint.  You have severe chest or abdominal pain.  You vomit repeatedly.  You have trouble breathing. MAKE SURE YOU:   Understand these instructions.  Will watch your condition.  Will get help right away if you are not doing well or get worse.   This information is not intended to replace advice given to you by your health care provider. Make sure you discuss any questions you have with your health care provider.   Document Released: 05/12/2005 Document Revised: 09/26/2014 Document Reviewed: 03/04/2013 Elsevier Interactive Patient Education 2016 Elsevier Inc.  

## 2015-08-30 NOTE — Progress Notes (Signed)
Pre visit review using our clinic review tool, if applicable. No additional management support is needed unless otherwise documented below in the visit note. 

## 2015-08-30 NOTE — Telephone Encounter (Signed)
Patient called in and took the 1:00pm appt for today with Dr. Ronnald Ramp.

## 2015-08-31 ENCOUNTER — Encounter: Payer: Self-pay | Admitting: Internal Medicine

## 2015-09-11 ENCOUNTER — Other Ambulatory Visit: Payer: Self-pay

## 2015-09-11 ENCOUNTER — Telehealth: Payer: Self-pay

## 2015-09-11 NOTE — Telephone Encounter (Signed)
Son, [name] says that [place] has yet to call with an app. About his mothers leg. Can you please follow up with [facility]? They state they have yet to get anything about it. Thank you.

## 2015-09-11 NOTE — Telephone Encounter (Signed)
Pt informed they will probaby here something from [facility] tomorrow. Info was faxed yesterday

## 2015-09-11 NOTE — Patient Outreach (Signed)
[facility]) Care Management  09/11/2015  NINAMARIE KEENON [birth year] [id number]   Telephone Screen  Referral Date: 09/11/15 Referral Source: [facility] Referral Reason: "disease and symptom management"   Outreach attempt #1 to patient. No answer. RN CM left HIPAA compliant voicemail message along with contact info.    Plan: RN CM will outreach attempt to patient within a week.  [name], RN,BSN,CCM [place] [facility] [facility] Telephonic Care Management Coordinator Direct Phone: [phone] Toll Free: [phone] Fax: [phone]

## 2015-09-14 ENCOUNTER — Other Ambulatory Visit: Payer: Self-pay

## 2015-09-14 ENCOUNTER — Ambulatory Visit: Payer: Self-pay

## 2015-09-14 NOTE — Patient Outreach (Signed)
[facility]) Care Management  09/14/2015  Tiffany Charles 25-May-1930 [id number]   Telephone Screen  Referral Date: 09/11/15 Referral Source: [facility] Referral Reason: "disease and symptom management"    Outreach attempt #2 to patient. Spoke with patient's son. He reported that patient was currently out of town at [employer]. Advised that patient would be back home next week.   Plan: RN CM will make outreach attempt to patient within a week.  [name], RN,BSN,CCM [facility] Management Telephonic Care Management Coordinator Direct Phone: [phone] Toll Free: [phone] Fax: [phone]

## 2015-09-15 ENCOUNTER — Other Ambulatory Visit: Payer: Self-pay | Admitting: Internal Medicine

## 2015-09-15 DIAGNOSIS — R2241 Localized swelling, mass and lump, right lower limb: Secondary | ICD-10-CM

## 2015-09-18 ENCOUNTER — Telehealth: Payer: Self-pay | Admitting: Internal Medicine

## 2015-09-18 NOTE — Telephone Encounter (Signed)
Son is very upset.  States patient was referred to wrong place to have cancer removed off patient's leg.  Is requesting a call back before 5pm today.

## 2015-09-19 ENCOUNTER — Ambulatory Visit: Payer: Self-pay

## 2015-09-19 ENCOUNTER — Other Ambulatory Visit: Payer: Self-pay | Admitting: Internal Medicine

## 2015-09-19 ENCOUNTER — Other Ambulatory Visit: Payer: Self-pay

## 2015-09-19 DIAGNOSIS — R2241 Localized swelling, mass and lump, right lower limb: Secondary | ICD-10-CM

## 2015-09-19 DIAGNOSIS — C44722 Squamous cell carcinoma of skin of right lower limb, including hip: Secondary | ICD-10-CM | POA: Diagnosis not present

## 2015-09-19 NOTE — Telephone Encounter (Signed)
Called pt back and left msg to inform her of appointment with Raliegh Ip today 4/26 at 2:30

## 2015-09-19 NOTE — Patient Outreach (Signed)
[facility]) Care Management  09/19/2015  Tiffany Charles 1929/08/23 [id number]   Telephone Screen  Referral Date: 09/11/15 Referral Source: [employer] Referral Reason: "disease and symptom management"   Outreach attempt #3 to patient. No answer at present. RN CM left HIPAA compliant voicemail message along with contact info.    Plan: RN CM will send unsuccessful outreach letter to patient and close case if no response from patient within 10 business days.  [name], RN,BSN,CCM [facility] Management Telephonic Care Management Coordinator Direct Phone: [phone] Toll Free: [phone] Fax: [phone]

## 2015-09-21 DIAGNOSIS — C44722 Squamous cell carcinoma of skin of right lower limb, including hip: Secondary | ICD-10-CM | POA: Diagnosis not present

## 2015-09-27 DIAGNOSIS — C44722 Squamous cell carcinoma of skin of right lower limb, including hip: Secondary | ICD-10-CM | POA: Diagnosis not present

## 2015-09-27 DIAGNOSIS — C44729 Squamous cell carcinoma of skin of left lower limb, including hip: Secondary | ICD-10-CM | POA: Diagnosis not present

## 2015-09-27 DIAGNOSIS — Z85828 Personal history of other malignant neoplasm of skin: Secondary | ICD-10-CM | POA: Diagnosis not present

## 2015-10-03 ENCOUNTER — Other Ambulatory Visit: Payer: Self-pay

## 2015-10-03 NOTE — Patient Outreach (Signed)
[facility]) Care Management  10/03/2015  Tiffany Charles 09/27/1929 [id number]   Telephone Screen  Referral Date: 09/11/15 Referral Source: [employer] Referral Reason: "disease and symptom management"   Multiple attempts to establish contact with patient. No response from unsuccessful outreach letter to patient. Case is being closed at this time.   Plan: RN CM will notify [facility] administrative assistant of case closure. RN CM will send MD case closure letter.   [name], RN,BSN,CCM [place] Management Telephonic Care Management Coordinator Direct Phone: [phone] Toll Free: [phone] Fax: [phone]

## 2015-10-04 ENCOUNTER — Encounter: Payer: Self-pay | Admitting: Internal Medicine

## 2015-10-09 ENCOUNTER — Telehealth: Payer: Self-pay | Admitting: Internal Medicine

## 2015-10-09 NOTE — Telephone Encounter (Signed)
Patients son [name] is calling to advise that they need a new [employer] referral for dermatology.   Fax # [phone] [place] [facility]  Herbie [place]ridge  appt is 10/11/2015 @ 11AM

## 2015-10-11 DIAGNOSIS — C44722 Squamous cell carcinoma of skin of right lower limb, including hip: Secondary | ICD-10-CM | POA: Diagnosis not present

## 2015-10-11 DIAGNOSIS — Z85828 Personal history of other malignant neoplasm of skin: Secondary | ICD-10-CM | POA: Diagnosis not present

## 2015-10-12 NOTE — Telephone Encounter (Signed)
Referral [id number]  start date 10/10/15, exp 04/07/16 good for 6 visits.

## 2016-09-16 ENCOUNTER — Telehealth: Payer: Self-pay | Admitting: Cardiovascular Disease

## 2016-09-16 ENCOUNTER — Observation Stay (HOSPITAL_COMMUNITY)
Admission: EM | Admit: 2016-09-16 | Discharge: 2016-09-17 | Disposition: A | Discharge status: Home / Self Care | Payer: Medicare HMO | Attending: Cardiology | Admitting: Cardiology

## 2016-09-16 ENCOUNTER — Encounter (HOSPITAL_COMMUNITY): Payer: Self-pay | Admitting: *Deleted

## 2016-09-16 ENCOUNTER — Emergency Department (HOSPITAL_COMMUNITY): Payer: Medicare HMO

## 2016-09-16 DIAGNOSIS — Z7982 Long term (current) use of aspirin: Secondary | ICD-10-CM | POA: Insufficient documentation

## 2016-09-16 DIAGNOSIS — K149 Disease of tongue, unspecified: Secondary | ICD-10-CM | POA: Diagnosis not present

## 2016-09-16 DIAGNOSIS — I493 Ventricular premature depolarization: Secondary | ICD-10-CM

## 2016-09-16 DIAGNOSIS — E7800 Pure hypercholesterolemia, unspecified: Secondary | ICD-10-CM | POA: Diagnosis not present

## 2016-09-16 DIAGNOSIS — Z85828 Personal history of other malignant neoplasm of skin: Secondary | ICD-10-CM | POA: Insufficient documentation

## 2016-09-16 DIAGNOSIS — M1990 Unspecified osteoarthritis, unspecified site: Secondary | ICD-10-CM | POA: Insufficient documentation

## 2016-09-16 DIAGNOSIS — F329 Major depressive disorder, single episode, unspecified: Secondary | ICD-10-CM | POA: Diagnosis not present

## 2016-09-16 DIAGNOSIS — R8299 Other abnormal findings in urine: Secondary | ICD-10-CM | POA: Diagnosis not present

## 2016-09-16 DIAGNOSIS — I10 Essential (primary) hypertension: Secondary | ICD-10-CM | POA: Diagnosis not present

## 2016-09-16 DIAGNOSIS — I4821 Permanent atrial fibrillation: Secondary | ICD-10-CM | POA: Diagnosis present

## 2016-09-16 DIAGNOSIS — I110 Hypertensive heart disease with heart failure: Secondary | ICD-10-CM | POA: Diagnosis not present

## 2016-09-16 DIAGNOSIS — Z7901 Long term (current) use of anticoagulants: Secondary | ICD-10-CM

## 2016-09-16 DIAGNOSIS — J449 Chronic obstructive pulmonary disease, unspecified: Secondary | ICD-10-CM | POA: Diagnosis present

## 2016-09-16 DIAGNOSIS — Z955 Presence of coronary angioplasty implant and graft: Secondary | ICD-10-CM | POA: Diagnosis not present

## 2016-09-16 DIAGNOSIS — I272 Pulmonary hypertension, unspecified: Secondary | ICD-10-CM

## 2016-09-16 DIAGNOSIS — R531 Weakness: Secondary | ICD-10-CM | POA: Diagnosis present

## 2016-09-16 DIAGNOSIS — R0600 Dyspnea, unspecified: Secondary | ICD-10-CM

## 2016-09-16 DIAGNOSIS — I5032 Chronic diastolic (congestive) heart failure: Secondary | ICD-10-CM | POA: Diagnosis present

## 2016-09-16 DIAGNOSIS — R5383 Other fatigue: Secondary | ICD-10-CM | POA: Diagnosis not present

## 2016-09-16 DIAGNOSIS — Z79899 Other long term (current) drug therapy: Secondary | ICD-10-CM | POA: Diagnosis not present

## 2016-09-16 DIAGNOSIS — R0789 Other chest pain: Secondary | ICD-10-CM | POA: Diagnosis not present

## 2016-09-16 DIAGNOSIS — I071 Rheumatic tricuspid insufficiency: Secondary | ICD-10-CM

## 2016-09-16 DIAGNOSIS — Z7951 Long term (current) use of inhaled steroids: Secondary | ICD-10-CM | POA: Insufficient documentation

## 2016-09-16 DIAGNOSIS — F17210 Nicotine dependence, cigarettes, uncomplicated: Secondary | ICD-10-CM | POA: Insufficient documentation

## 2016-09-16 DIAGNOSIS — Z0001 Encounter for general adult medical examination with abnormal findings: Secondary | ICD-10-CM | POA: Diagnosis not present

## 2016-09-16 DIAGNOSIS — R9431 Abnormal electrocardiogram [ECG] [EKG]: Secondary | ICD-10-CM | POA: Diagnosis not present

## 2016-09-16 DIAGNOSIS — E785 Hyperlipidemia, unspecified: Secondary | ICD-10-CM | POA: Diagnosis not present

## 2016-09-16 DIAGNOSIS — G3184 Mild cognitive impairment, so stated: Secondary | ICD-10-CM | POA: Diagnosis not present

## 2016-09-16 DIAGNOSIS — L988 Other specified disorders of the skin and subcutaneous tissue: Secondary | ICD-10-CM | POA: Diagnosis not present

## 2016-09-16 DIAGNOSIS — R079 Chest pain, unspecified: Secondary | ICD-10-CM | POA: Diagnosis not present

## 2016-09-16 DIAGNOSIS — I499 Cardiac arrhythmia, unspecified: Secondary | ICD-10-CM | POA: Diagnosis not present

## 2016-09-16 DIAGNOSIS — I482 Chronic atrial fibrillation: Secondary | ICD-10-CM

## 2016-09-16 DIAGNOSIS — I2510 Atherosclerotic heart disease of native coronary artery without angina pectoris: Secondary | ICD-10-CM | POA: Diagnosis not present

## 2016-09-16 HISTORY — DX: Rheumatic tricuspid insufficiency: I07.1

## 2016-09-16 HISTORY — DX: Pulmonary hypertension, unspecified: I27.20

## 2016-09-16 LAB — PROTIME-INR
INR: 1.54
PROTHROMBIN TIME: 18.7 s — AB (ref 11.4–15.2)

## 2016-09-16 LAB — CBC
HEMATOCRIT: 42.9 % (ref 36.0–46.0)
Hemoglobin: 14.9 g/dL (ref 12.0–15.0)
MCH: 32.1 pg (ref 26.0–34.0)
MCHC: 34.7 g/dL (ref 30.0–36.0)
MCV: 92.5 fL (ref 78.0–100.0)
Platelets: 162 10*3/uL (ref 150–400)
RBC: 4.64 MIL/uL (ref 3.87–5.11)
RDW: 13.5 % (ref 11.5–15.5)
WBC: 8.2 10*3/uL (ref 4.0–10.5)

## 2016-09-16 LAB — BASIC METABOLIC PANEL
Anion gap: 7 (ref 5–15)
BUN: 11 mg/dL (ref 6–20)
CHLORIDE: 105 mmol/L (ref 101–111)
CO2: 25 mmol/L (ref 22–32)
Calcium: 9.1 mg/dL (ref 8.9–10.3)
Creatinine, Ser: 1.04 mg/dL — ABNORMAL HIGH (ref 0.44–1.00)
GFR calc non Af Amer: 48 mL/min — ABNORMAL LOW (ref >60)
GFR, EST AFRICAN AMERICAN: 56 mL/min — AB (ref >60)
Glucose, Bld: 135 mg/dL — ABNORMAL HIGH (ref 65–99)
POTASSIUM: 3.8 mmol/L (ref 3.5–5.1)
SODIUM: 137 mmol/L (ref 135–145)

## 2016-09-16 LAB — TSH: TSH: 1.47 u[IU]/mL (ref 0.350–4.500)

## 2016-09-16 LAB — I-STAT TROPONIN, ED: Troponin i, poc: 0.01 ng/mL (ref 0.00–0.08)

## 2016-09-16 LAB — TROPONIN I: Troponin I: <0.03 ng/mL (ref <0.03)

## 2016-09-16 LAB — DIGOXIN LEVEL

## 2016-09-16 MED ORDER — RANOLAZINE ER 500 MG PO TB12
500.0000 mg | ORAL_TABLET | Freq: Two times a day (BID) | ORAL | Status: DC
  Administered 2016-09-16 – 2016-09-17 (×2): 500 mg via ORAL
  Filled 2016-09-16 (×2): qty 1

## 2016-09-16 MED ORDER — DIPHENHYDRAMINE HCL 25 MG PO CAPS: 25.0000 mg | ORAL_CAPSULE | Freq: Every evening | ORAL | Status: DC | PRN

## 2016-09-16 MED ORDER — SODIUM CHLORIDE 0.9 % IV SOLN
250.0000 mL | INTRAVENOUS | Status: DC | PRN
Start: 2016-09-16 — End: 2016-09-17

## 2016-09-16 MED ORDER — ASPIRIN EC 81 MG PO TBEC
81.0000 mg | DELAYED_RELEASE_TABLET | ORAL | Status: DC
  Administered 2016-09-17: 81 mg via ORAL
  Filled 2016-09-16: qty 1

## 2016-09-16 MED ORDER — ACETAMINOPHEN 325 MG PO TABS: 650.0000 mg | ORAL_TABLET | ORAL | Status: DC | PRN

## 2016-09-16 MED ORDER — AMLODIPINE BESYLATE 5 MG PO TABS
2.5000 mg | ORAL_TABLET | Freq: Every day | ORAL | Status: DC
  Administered 2016-09-17: 2.5 mg via ORAL
  Filled 2016-09-16: qty 1

## 2016-09-16 MED ORDER — ZOLPIDEM TARTRATE 5 MG PO TABS: 5.0000 mg | ORAL_TABLET | Freq: Every evening | ORAL | Status: DC | PRN

## 2016-09-16 MED ORDER — SODIUM CHLORIDE 0.9% FLUSH
3.0000 mL | Freq: Two times a day (BID) | INTRAVENOUS | Status: DC
  Administered 2016-09-16: 3 mL via INTRAVENOUS

## 2016-09-16 MED ORDER — PRAVASTATIN SODIUM 40 MG PO TABS
40.0000 mg | ORAL_TABLET | Freq: Every day | ORAL | Status: DC
  Administered 2016-09-16: 40 mg via ORAL
  Filled 2016-09-16: qty 1

## 2016-09-16 MED ORDER — ONDANSETRON HCL 4 MG/2ML IJ SOLN: 4.0000 mg | Freq: Four times a day (QID) | INTRAMUSCULAR | Status: DC | PRN

## 2016-09-16 MED ORDER — NITROGLYCERIN 0.4 MG SL SUBL: 0.4000 mg | SUBLINGUAL_TABLET | SUBLINGUAL | Status: DC | PRN

## 2016-09-16 MED ORDER — ACETAMINOPHEN 500 MG PO TABS: 500.0000 mg | ORAL_TABLET | Freq: Every evening | ORAL | Status: DC | PRN

## 2016-09-16 MED ORDER — ALPRAZOLAM 0.25 MG PO TABS: 0.2500 mg | ORAL_TABLET | Freq: Two times a day (BID) | ORAL | Status: DC | PRN

## 2016-09-16 MED ORDER — RANOLAZINE ER 500 MG PO TB12: 500.0000 mg | ORAL_TABLET | Freq: Two times a day (BID) | ORAL | Status: DC

## 2016-09-16 MED ORDER — CARVEDILOL 3.125 MG PO TABS
3.1250 mg | ORAL_TABLET | Freq: Two times a day (BID) | ORAL | Status: DC
  Administered 2016-09-16 – 2016-09-17 (×2): 3.125 mg via ORAL
  Filled 2016-09-16 (×2): qty 1

## 2016-09-16 MED ORDER — DIPHENHYDRAMINE-APAP (SLEEP) 25-500 MG PO TABS: 1.0000 | ORAL_TABLET | Freq: Every evening | ORAL | Status: DC | PRN

## 2016-09-16 MED ORDER — SODIUM CHLORIDE 0.9% FLUSH: 3.0000 mL | INTRAVENOUS | Status: DC | PRN

## 2016-09-16 MED ORDER — RIVAROXABAN 15 MG PO TABS
15.0000 mg | ORAL_TABLET | Freq: Once | ORAL | Status: AC
  Administered 2016-09-16: 15 mg via ORAL
  Filled 2016-09-16: qty 1

## 2016-09-16 MED ORDER — RIVAROXABAN 15 MG PO TABS
15.0000 mg | ORAL_TABLET | Freq: Every day | ORAL | Status: DC
Start: 2016-09-17 — End: 2016-09-17

## 2016-09-16 NOTE — Progress Notes (Signed)
Patient received from ED and oriented to room. Tele monitoring and skin assessment completed. Patient advised to call before getting up. Call light within reach.

## 2016-09-16 NOTE — ED Notes (Signed)
Report attempted 

## 2016-09-16 NOTE — ED Notes (Signed)
[name] who lives with her reports patient stopped taking ALL meds 6 mos ago to see if she would feel better.

## 2016-09-16 NOTE — ED Notes (Signed)
Pharm tech called.

## 2016-09-16 NOTE — ED Notes (Signed)
Re-ordered Dinner Tray, Claimed they did not have the order.

## 2016-09-16 NOTE — ED Provider Notes (Signed)
[facility] DEPT Provider Note   CSN: [id number] Arrival date & time: 09/16/16  1223     History   Chief Complaint Chief Complaint  Patient presents with  . Abnormal ECG    HPI Tiffany Charles is a 83 y.o. female.Complains of feeling generally tired for the past 2-3 weeks accompanied by intermittent tightness at the anterior chest which is nonradiating and not brought on by anything and not relieved by anything. She is presently asymptomatic. She saw her primary care physician in [place] today who determined that she had an abnormal EKG and advised that she come to the emergency department for further evaluation. She admits to noncompliance with Xarelto for several weeks. No other associated symptoms. Nothing makes symptoms better or worse. No treatment prior to coming  HPI  Past Medical History:  Diagnosis Date  . Angina pectoris   . Arthritis   . Chronic anticoagulation    Xarelto  . Chronic diastolic (congestive) heart failure ([place])   . COPD (chronic obstructive pulmonary disease) ([place])   . Coronary artery disease 2010   residual AV groove lesion- Medical Rx  . Depression   . Essential hypertension   . Hyperlipidemia   . Permanent atrial fibrillation ([place])   . Skin cancer Jan 2013   squamous cell- removed  . Stented coronary artery 2009   RCA stent    Patient Active Problem List   Diagnosis Date Noted  . Mass of skin of ankle 08/30/2015  . Chronic diastolic (congestive) heart failure ([place])   . Essential hypertension   . Generalized OA 12/28/2014  . Routine general medical examination at a health care facility 11/09/2013  . Depression, major ([place]) 11/07/2013  . Chronic anticoagulation- Xarelto 12/28/2012  . Permanent atrial fibrillation ([place]) 10/28/2012  . COPD - PFTs in June 2014 with MET test 10/28/2012  . CAD, RCA BMS '09, 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 low risk Myoview Feb 2013 10/28/2012  . Hyperlipidemia 10/28/2012    Past Surgical  History:  Procedure Laterality Date  .  CATARACTS REMOVED    . ABDOMINAL HYSTERECTOMY    . CARDIAC CATHETERIZATION  03/22/2009   Started medical therapy  . CARDIOVERSION  06/05/2009   3 attempts, last attempt successful  . CORONARY ANGIOPLASTY WITH STENT PLACEMENT  2009   RCA Vision stent  . HIP SURGERY    . MELANOMA EXCISION  06/06/2011   Procedure: MELANOMA EXCISION;  Surgeon: Adin Hector, MD;  Location: [facility] ORS;  Service: General;  Laterality: Left;  re excision squamous cell lesion left chest wall     OB History    No data available       Home Medications    Prior to Admission medications   Medication Sig Start Date End Date Taking? Authorizing Provider  albuterol (PROVENTIL HFA;VENTOLIN HFA) 108 (90 BASE) MCG/ACT inhaler Inhale 2 puffs into the lungs every 6 (six) hours as needed for wheezing.    Historical Provider, MD  amLODipine (NORVASC) 5 MG tablet Take 1 tablet (5 mg total) by mouth daily. 01/03/13   Mihai Croitoru, MD  Ascorbic Acid (VITAMIN C PO) Take 1 tablet by mouth daily as needed (for immune support).     Historical Provider, MD  aspirin EC 81 MG tablet Take 81 mg by mouth daily.    Historical Provider, MD  citalopram (CELEXA) 20 MG tablet Take 1 tablet (37m) by mouth daily. Please request future refills from primary care provider. 11/07/13   TJanith Lima MD  digoxin (LANOXIN) 0.125 MG tablet Take 1 tablet (125 mcg total) by mouth every morning. 10/04/13   Mihai Croitoru, MD  isosorbide mononitrate (IMDUR) 30 MG 24 hr tablet Take 30 mg by mouth daily.  10/04/13   Historical Provider, MD  nitroGLYCERIN (NITROLINGUAL) 0.4 MG/SPRAY spray Place 1 spray under the tongue every 5 (five) minutes as needed for chest pain.    Historical Provider, MD  pravastatin (PRAVACHOL) 80 MG tablet Take 1 tablet (80 mg total) by mouth at bedtime. 07/14/13   Mihai Croitoru, MD  ranolazine (RANEXA) 1000 MG SR tablet Take 1 tablet (1,000 mg total) by mouth 2 (two) times daily. 07/07/13    Mihai Croitoru, MD  Rivaroxaban (XARELTO) 15 MG TABS tablet Take 1 tablet (15 mg total) by mouth daily before supper. 08/01/15   Thurnell Lose, MD  Tiotropium Bromide Monohydrate (SPIRIVA RESPIMAT) 2.5 MCG/ACT AERS Inhale 2 puffs into the lungs daily. 08/09/15   [name], MD  traMADol (ULTRAM) 50 MG tablet Take 1 tablet (50 mg total) by mouth every 12 (twelve) hours as needed. 12/28/14   [name], MD    Family History Family History  Problem Relation Age of Onset  . Stroke Mother   . Heart disease Mother   . Coronary artery disease Father   . Heart disease Father   . Stroke Sister   . Heart disease Brother   . Stroke Sister   . Stroke Sister     Social History Social History  Substance Use Topics  . Smoking status: Former Smoker    Quit date: 05/28/2010  . Smokeless tobacco: Never Used  . Alcohol use No   Current smoker  Allergies   Codeine   Review of Systems Review of Systems  Constitutional: Positive for fatigue.  HENT: Negative.   Respiratory: Negative.   Cardiovascular: Positive for chest pain.  Gastrointestinal: Negative.   Musculoskeletal: Negative.   Skin: Negative.   Neurological: Negative.   Psychiatric/Behavioral: Negative.   All other systems reviewed and are negative.    Physical Exam Updated Vital Signs BP (!) 149/99   Pulse (!) 102   Resp 16   SpO2 96%   Physical Exam  Constitutional:  Chronically ill-appearing  HENT:  Head: Normocephalic and atraumatic.  Eyes: Conjunctivae are normal. Pupils are equal, round, and reactive to light.  Neck: Neck supple. No tracheal deviation present. No thyromegaly present.  Cardiovascular: Normal rate.   No murmur heard. Irregularly irregular  Pulmonary/Chest: Effort normal and breath sounds normal.  Abdominal: Soft. Bowel sounds are normal. She exhibits no distension. There is no tenderness.  Musculoskeletal: Normal range of motion. She exhibits no edema or tenderness.  Neurological: She is  alert. Coordination normal.  Skin: Skin is warm and dry. No rash noted.  Psychiatric: She has a normal mood and affect.  Nursing note and vitals reviewed.    ED Treatments / Results  Labs (all labs ordered are listed, but only abnormal results are displayed) Labs Reviewed  BASIC METABOLIC PANEL - Abnormal; Notable for the following:       Result Value   Glucose, Bld 135 (*)    Creatinine, Ser 1.04 (*)    GFR calc non Af Amer 48 (*)    GFR calc Af Amer 56 (*)    All other components within normal limits  CBC  I-STAT TROPOININ, ED    EKG  EKG Interpretation  Date/Time:  Tuesday September 16 2016 12:27:59 EDT Ventricular Rate:  107 PR  Interval:    QRS Duration: 68 QT Interval:  348 QTC Calculation: 464 R Axis:   60 Text Interpretation:  Atrial fibrillation with rapid ventricular response with premature ventricular or aberrantly conducted complexes Septal infarct , age undetermined Abnormal ECG Premature ventricular complexes New since previous tracing Confirmed by Winfred Leeds  MD, Addilee Neu [phone]) on 09/16/2016 3:01:35 PM      Chest x-ray viewed by me Results for orders placed or performed during the hospital encounter of 03/55/97  Basic metabolic panel  Result Value Ref Range   Sodium 137 135 - 145 mmol/L   Potassium 3.8 3.5 - 5.1 mmol/L   Chloride 105 101 - 111 mmol/L   CO2 25 22 - 32 mmol/L   Glucose, Bld 135 (H) 65 - 99 mg/dL   BUN 11 6 - 20 mg/dL   Creatinine, Ser 1.04 (H) 0.44 - 1.00 mg/dL   Calcium 9.1 8.9 - 10.3 mg/dL   GFR calc non Af Amer 48 (L) >60 mL/min   GFR calc Af Amer 56 (L) >60 mL/min   Anion gap 7 5 - 15  CBC  Result Value Ref Range   WBC 8.2 4.0 - 10.5 K/uL   RBC 4.64 3.87 - 5.11 MIL/uL   Hemoglobin 14.9 12.0 - 15.0 g/dL   HCT 42.9 36.0 - 46.0 %   MCV 92.5 78.0 - 100.0 fL   MCH 32.1 26.0 - 34.0 pg   MCHC 34.7 30.0 - 36.0 g/dL   RDW 13.5 11.5 - 15.5 %   Platelets 162 150 - 400 K/uL  I-stat troponin, ED  Result Value Ref Range   Troponin i, poc 0.01  0.00 - 0.08 ng/mL   Comment 3           Dg Chest 2 View  Result Date: 09/16/2016 CLINICAL DATA:  Abnormal EKG EXAM: CHEST  2 VIEW COMPARISON:  08/09/2015 FINDINGS: Borderline cardiomegaly, stable. Stable mild aortic tortuosity. Negative hila. Chronic interstitial coarsening without Kerley lines or air bronchogram. No effusion or pneumothorax. IMPRESSION: Stable compared to prior.  No evidence of active disease. Electronically Signed   By: Monte Fantasia M.D.   On: 09/16/2016 13:36   Radiology Dg Chest 2 View  Result Date: 09/16/2016 CLINICAL DATA:  Abnormal EKG EXAM: CHEST  2 VIEW COMPARISON:  08/09/2015 FINDINGS: Borderline cardiomegaly, stable. Stable mild aortic tortuosity. Negative hila. Chronic interstitial coarsening without Kerley lines or air bronchogram. No effusion or pneumothorax. IMPRESSION: Stable compared to prior.  No evidence of active disease. Electronically Signed   By: Monte Fantasia M.D.   On: 09/16/2016 13:36    Procedures Procedures (including critical care time)  Medications Ordered in ED Medications - No data to display  Results for orders placed or performed during the hospital encounter of 41/63/84  Basic metabolic panel  Result Value Ref Range   Sodium 137 135 - 145 mmol/L   Potassium 3.8 3.5 - 5.1 mmol/L   Chloride 105 101 - 111 mmol/L   CO2 25 22 - 32 mmol/L   Glucose, Bld 135 (H) 65 - 99 mg/dL   BUN 11 6 - 20 mg/dL   Creatinine, Ser 1.04 (H) 0.44 - 1.00 mg/dL   Calcium 9.1 8.9 - 10.3 mg/dL   GFR calc non Af Amer 48 (L) >60 mL/min   GFR calc Af Amer 56 (L) >60 mL/min   Anion gap 7 5 - 15  CBC  Result Value Ref Range   WBC 8.2 4.0 - 10.5 K/uL   RBC 4.64  3.87 - 5.11 MIL/uL   Hemoglobin 14.9 12.0 - 15.0 g/dL   HCT 42.9 36.0 - 46.0 %   MCV 92.5 78.0 - 100.0 fL   MCH 32.1 26.0 - 34.0 pg   MCHC 34.7 30.0 - 36.0 g/dL   RDW 13.5 11.5 - 15.5 %   Platelets 162 150 - 400 K/uL  I-stat troponin, ED  Result Value Ref Range   Troponin i, poc 0.01 0.00 -  0.08 ng/mL   Comment 3           Dg Chest 2 View  Result Date: 09/16/2016 CLINICAL DATA:  Abnormal EKG EXAM: CHEST  2 VIEW COMPARISON:  08/09/2015 FINDINGS: Borderline cardiomegaly, stable. Stable mild aortic tortuosity. Negative hila. Chronic interstitial coarsening without Kerley lines or air bronchogram. No effusion or pneumothorax. IMPRESSION: Stable compared to prior.  No evidence of active disease. Electronically Signed   By: Monte Fantasia M.D.   On: 09/16/2016 13:36  Chest x-ray viewed by me Initial Impression / Assessment and Plan / ED Course  I have reviewed the triage vital signs and the nursing notes.  Pertinent labs & imaging results that were available during my care of the patient were reviewed by me and considered in my medical decision making (see chart for details).     I've consulted [facility] to evaluate patient in ED. I counseled patient for 5 minutes on smoking cessation Final Clinical Impressions(s) / ED Diagnoses  Diagnosis #1 chest pain at rest #2 fatigue #3 medication noncompliance #4 tobacco abuse Final diagnoses:  None    New Prescriptions New Prescriptions   No medications on file     Orlie Dakin, MD 09/16/16 [phone]

## 2016-09-16 NOTE — Progress Notes (Signed)
Spoke w/ pharmacist about pt home meds. Pt has not taken anything in months. Recommended starting Ranexa at a decreased dose.  Also start Pravachol at a decreased dose, if 80 mg needed, pt may benefit from taking a low dose of a stronger statin.  Tiffany Charles 09/16/2016 8:54 PM [facility] [phone]

## 2016-09-16 NOTE — H&P (Signed)
History and Physical   Patient ID: Tiffany Charles MRN: [id number], DOB/AGE: 81-Feb-1931 81 y.o. Date of Encounter: 09/16/2016  Primary Physician: Scarlette Calico, MD Primary Cardiologist: Dr Sallyanne Kuster 03/14/2014  Chief Complaint:  Chest pain  HPI: Tiffany Charles is a 83 y.o. female with a history of D-CHF, HTN, HLD, perm Afib on Xarelto, COPD, CAD w/ stent RCA and med rx for residual dz distal CFX on Ranexa.  Her son is with her in the ER, he provides a lot of information and states will help her be compliant with rx in the future.  She had been off some of her cardiac meds for a long time.  She had a routine appointment with Dr Ronnald Ramp today as a new pt. She had an ECG and told him about her CP, was sent to the ER. She has been having chest tightness for at least a couple of weeks. Pt states the tightness has been 8/10 at times. She was getting this every day for the last couple of weeks. She has also been tired, complaining of fatigue to her son. Pt denies SOB, N&V or diaphoresis with the pain. She says it was not as bad as the pain before her stents.   She is not taking her Xarelto right now, unclear how long she has been off it. No unilateral weakness, had 1 fall recently but denies syncope.   She has been smoking more recently and is not eating very well. She has lost weight in the last year.    Past Medical History:  Diagnosis Date  . Angina pectoris   . Arthritis   . Chronic anticoagulation    Xarelto  . Chronic diastolic (congestive) heart failure ([place])   . COPD (chronic obstructive pulmonary disease) ([place])   . Coronary artery disease 2010   residual AV groove lesion- Medical Rx  . Depression   . Essential hypertension   . Hyperlipidemia   . Permanent atrial fibrillation ([place])   . Skin cancer Jan 2013   squamous cell- removed  . Stented coronary artery 2009   RCA stent    Surgical History:  Past Surgical History:  Procedure Laterality Date  .  CATARACTS REMOVED     . ABDOMINAL HYSTERECTOMY    . CARDIAC CATHETERIZATION  03/22/2009   Started medical therapy  . CARDIOVERSION  06/05/2009   3 attempts, last attempt successful  . CORONARY ANGIOPLASTY WITH STENT PLACEMENT  2009   RCA Vision stent  . HIP SURGERY    . MELANOMA EXCISION  06/06/2011   Procedure: MELANOMA EXCISION;  Surgeon: Adin Hector, MD;  Location: [facility];  Service: General;  Laterality: Left;  re excision squamous cell lesion left chest wall      I have reviewed the patient's current medications. Medication Sig  albuterol (PROVENTIL HFA;VENTOLIN HFA) 108 (90 BASE) MCG/ACT inhaler Inhale 2 puffs into the lungs every 6 (six) hours as needed for wheezing.  amLODipine (NORVASC) 5 MG tablet Take 1 tablet (5 mg total) by mouth daily.  Ascorbic Acid (VITAMIN C PO) Take 1 tablet by mouth daily as needed (for immune support).   aspirin EC 81 MG tablet Take 81 mg by mouth every other day.   digoxin (LANOXIN) 0.125 MG tablet Take 1 tablet (125 mcg total) by mouth every morning. Patient taking differently: Take 125 mcg by mouth every other day.   diphenhydramine-acetaminophen (TYLENOL PM) 25-500 MG TABS tablet Take 1 tablet by mouth at bedtime as needed (  sleep).  nitroGLYCERIN (NITROLINGUAL) 0.4 MG/SPRAY spray Place 1 spray under the tongue every 5 (five) minutes as needed for chest pain.  Rivaroxaban (XARELTO) 15 MG TABS tablet Take 1 tablet (15 mg total) by mouth daily before supper.  traMADol (ULTRAM) 50 MG tablet Take 1 tablet (50 mg total) by mouth every 12 (twelve) hours as needed.  citalopram (CELEXA) 20 MG tablet Take 1 tablet (20mg ) by mouth daily. Please request future refills from primary care provider. Patient not taking: Reported on 09/16/2016  pravastatin (PRAVACHOL) 80 MG tablet Take 1 tablet (80 mg total) by mouth at bedtime. Patient not taking: Reported on 09/16/2016  ranolazine (RANEXA) 1000 MG SR tablet Take 1 tablet (1,000 mg total) by mouth 2 (two) times daily. Patient not  taking: Reported on 09/16/2016  Tiotropium Bromide Monohydrate (SPIRIVA RESPIMAT) 2.5 MCG/ACT AERS Inhale 2 puffs into the lungs daily. Patient not taking: Reported on 09/16/2016   Scheduled Meds: Continuous Infusions: PRN Meds:.  Allergies:  Allergies  Allergen Reactions  . Codeine Nausea And Vomiting    Social History   Social History  . Marital status: Widowed    Spouse name: N/A  . Number of children: N/A  . Years of education: N/A   Occupational History  . Retired    Social History Main Topics  . Smoking status: Former Smoker    Quit date: 05/28/2010  . Smokeless tobacco: Never Used  . Alcohol use No  . Drug use: No  . Sexual activity: No   Other Topics Concern  . Not on file   Social History Narrative   Pt lives alone, son is nearby.    Family History  Problem Relation Age of Onset  . Stroke Mother   . Heart disease Mother   . Coronary artery disease Father   . Heart disease Father   . Stroke Sister   . Heart disease Brother   . Stroke Sister   . Stroke Sister    Family Status  Relation Status  . Mother Deceased at age 46  . Father Deceased at age 83  . Sister Deceased at age 38  . Brother Deceased at age 82  . Sister Deceased at age 30  . Sister Deceased at age 84    Review of Systems:   Full 14-point review of systems otherwise negative except as noted above.  Physical Exam: Blood pressure 137/75, pulse 75, resp. rate 16, SpO2 97 %. General: Well developed, well nourished,female in no acute distress. Head: Normocephalic, atraumatic, sclera non-icteric, no xanthomas, nares are without discharge. Dentition: poor Neck: No carotid bruits. JVD elevated at 10 cm. No thyromegally Lungs: Good expansion bilaterally. without wheezes or rhonchi.  Heart: IRRegular rate and rhythm with S1 S2.  No S3 or S4.  No murmur, no rubs, or gallops appreciated. Abdomen: Soft, non-tender, non-distended with normoactive bowel sounds. No hepatomegaly. No  rebound/guarding. No obvious abdominal masses. Msk:  Strength and tone appear normal for age. No joint deformities or effusions, no spine or costo-vertebral angle tenderness. Extremities: No clubbing or cyanosis. No edema.  Distal pedal pulses are 2+ in 4 extrem Neuro: Alert and oriented X 3. Moves all extremities spontaneously. No focal deficits noted. Psych:  Responds to questions appropriately with a normal affect. Skin: No rashes or lesions noted  Labs:   Lab Results  Component Value Date   WBC 8.2 09/16/2016   HGB 14.9 09/16/2016   HCT 42.9 09/16/2016   MCV 92.5 09/16/2016   PLT 162 09/16/2016  Recent Labs Lab 09/16/16 1238  NA 137  K 3.8  CL 105  CO2 25  BUN 11  CREATININE 1.04*  CALCIUM 9.1  GLUCOSE 135*    Recent Labs  09/16/16 1246  TROPIPOC 0.01   Lab Results  Component Value Date   CHOL 182 12/28/2014   HDL 64.50 12/28/2014   LDLCALC 107 (H) 12/28/2014   TRIG 49.0 12/28/2014   Radiology/Studies: Dg Chest 2 View Result Date: 09/16/2016 CLINICAL DATA:  Abnormal EKG EXAM: CHEST  2 VIEW COMPARISON:  08/09/2015 FINDINGS: Borderline cardiomegaly, stable. Stable mild aortic tortuosity. Negative hila. Chronic interstitial coarsening without Kerley lines or air bronchogram. No effusion or pneumothorax. IMPRESSION: Stable compared to prior.  No evidence of active disease. Electronically Signed   By: Monte Fantasia M.D.   On: 09/16/2016 13:36     Cardiac Cath: 05/2007 ANGIOGRAPHIC DATA:  Left ventricle:  The left ventricular systolic  function was normal with ejection fraction of 60%.  There was no  significant wall motion abnormality.  There was no significant mitral  regurgitation.  Right coronary artery: The right coronary artery is a large vessel and  dominant vessel.  It has mild to moderate diffuse luminal irregularities  in the proximal and mid segment constituting 40-50% stenosis.  Distal  right coronary artery had an 80% stenosis.  It gave origin  to two PDA  branches.  The proximal PDA branch has an ostial 70% stenosis unchanged  from prior cardiac catheterization.  PLA branch is large.  This is  smooth and normal.  Left main coronary artery:  The left main coronary is a large vessel.  It has a proximal 10-15% stenosis which is calcified.  Circumflex artery:  The circumflex artery is a moderate-caliber vessel.  Gives origin to a large obtuse marginal-1.  The distal circumflex after  the origin of the obtuse marginal-1 had a focal 90% stenosis.  However,  this vessel was approximately about 2.0 mm in diameter.  This distal  circumflex received collaterals from the right coronary artery.  LAD:  The LAD is a large vessel.  It gives origin to several small  diagonals.  It has a 40% mid stenosis.  Otherwise, it has mild diffuse  luminal irregularity.  INTERVENTION DATA:  Successful PTCA and direct stenting of the distal  right coronary artery with implantation of a 3.5 x 23 mm Vision stent.  This stent was deployed at 16 atmospheres pressure giving a 3.9 mm  diameter.  Post stent implantation angiography revealed excellent  results.  Echo: 12/2012 - Left ventricle: The cavity size was normal. Wall thickness was normal. Systolic function was normal. The estimated ejection fraction was in the range of 55% to 65%. Wall motion was normal; there were no regional wall motion abnormalities. Doppler parameters are consistent with a reversible restrictive pattern, indicative of decreased left ventricular diastolic compliance and/or increased left atrial pressure (grade 3 diastolic dysfunction). - Aortic valve: Trileaflet; mildly thickened leaflets. Trivial regurgitation. - Mitral valve: Moderate regurgitation. - Left atrium: The atrium was severely dilated by volume assessment at 44.4 ml/m2 - Right ventricle: Systolic pressure was increased. - Right atrium: The atrium was severely dilated. - Tricuspid valve: Severe  regurgitation. - Pulmonary arteries: PA peak pressure: 48mm Hg (S). Impressions: - The right ventricular systolic pressure was increased consistent with moderate pulmonary hypertension.  ECG: 04/24 Atrial fib, controlled rate, frequent PVCs No acute ischemic changes  ASSESSMENT AND PLAN:  Principal Problem:   Chest pain with moderate risk  for cardiac etiology - Admit, r/o MI - if elevated ez>>cath - if neg ez, ck echo and decide on MV vs med rx  Active Problems:   Permanent atrial fibrillation (HCC) - HR is controlled, probably not on rx, follow    Hyperlipidemia  - resume statin    Chronic anticoagulation- Xarelto - pt has not been taking this, add heparin till need for invasive eval ruled out    Essential hypertension - BP close to normal. - restart amlodipine at 2.5 mg qd and up-titrate prn    Fatigue and general weakness - main symptom pta - will ck TSH, B12 - BP may have been too low, restart rx slowly - not anemic  Signed, Lenoard Aden 09/16/2016 7:11 PM Beeper [phone]  The patient was seen, examined and discussed with Rosaria Ferries, PA-C and I agree with the above.   83 y.o. female with a history of D-CHF, HTN, HLD, perm Afib on Xarelto, COPD, CAD w/ stent RCA and med rx for residual dz distal CFX on Ranexa. She was seen by a new PCP today who was concerned about abnormal ECG and called her son to bring her to the ER.  She states that she has had no chest pain, but she has noticed to feel more tired and slightly more SOB on exertion. She is fully independent, she walks, cooks, does house chores.  She hasn't been taking her medications as she doesn't like to take them including anticoagulation. She has had dry cough but no no fever chills, no falls, dizziness, no LE edema.  Echo from 2014 shows LVEF 60-65%, stage 3 DD, mild pulmonary hypertension. On physical exam, she has minimal crakles at the lung bases, no LE edema. BP 150/82 mmHg. Troponin is  negative x 1, ECG shows a-fib with RVR - ventricular rate 107 and frequent PVCs.   We will restart aspirin, pravastatin, xarelto, ranolazine and amlodipine 2.5 mg po daily, hold digoxin, start carvedilol 3.125 mg po BID. We will continue to check troponin, ECGs.  We will plan on ischemic work up if she continues to have symptoms.   Ena Dawley, MD 09/16/2016

## 2016-09-16 NOTE — ED Triage Notes (Signed)
Pt states that she was seen at her MDs today for routine visit. Pt states that she was call ed and told to come here for eval of an abnormal EKG. Pt denies any complaints other than "feeling washed out"

## 2016-09-16 NOTE — ED Notes (Signed)
Cards at bedside

## 2016-09-16 NOTE — Telephone Encounter (Signed)
Spoke with patient's son. She had a physical this AM and when she got home she was called by her PCP office and told to go to the hospital - something abnormal on EKG. Patient has h/o AF. Son wanted to know if he should follow PCP advice or bring her to see Dr. Sallyanne Kuster (last visit 02/2015). Advised that if PCP felt she needed ED/hospital eval, then that is likely the appropriate course of action (no records for review in [facility] at time of call). Advised to take patient to [facility] ED and call for cardiology follow up after ED visit/hospitalization.

## 2016-09-16 NOTE — Telephone Encounter (Signed)
New message    Pt son is calling about bringing pt in today for irregular heart beat per PCP.

## 2016-09-16 NOTE — Telephone Encounter (Signed)
ECG shows atrial fibrillation (permanent arrhythmia), but new superimposed frequent PVCs. Should have digoxin level checked as well as routine labs. MCr

## 2016-09-17 ENCOUNTER — Encounter (HOSPITAL_COMMUNITY): Payer: Self-pay | Admitting: Physician Assistant

## 2016-09-17 ENCOUNTER — Observation Stay (HOSPITAL_BASED_OUTPATIENT_CLINIC_OR_DEPARTMENT_OTHER): Payer: Medicare HMO

## 2016-09-17 DIAGNOSIS — E7800 Pure hypercholesterolemia, unspecified: Secondary | ICD-10-CM | POA: Diagnosis not present

## 2016-09-17 DIAGNOSIS — R079 Chest pain, unspecified: Secondary | ICD-10-CM | POA: Diagnosis not present

## 2016-09-17 DIAGNOSIS — Z7901 Long term (current) use of anticoagulants: Secondary | ICD-10-CM | POA: Diagnosis not present

## 2016-09-17 DIAGNOSIS — I272 Pulmonary hypertension, unspecified: Secondary | ICD-10-CM

## 2016-09-17 DIAGNOSIS — R0600 Dyspnea, unspecified: Secondary | ICD-10-CM

## 2016-09-17 DIAGNOSIS — I071 Rheumatic tricuspid insufficiency: Secondary | ICD-10-CM

## 2016-09-17 DIAGNOSIS — R5383 Other fatigue: Secondary | ICD-10-CM | POA: Diagnosis not present

## 2016-09-17 DIAGNOSIS — I10 Essential (primary) hypertension: Secondary | ICD-10-CM | POA: Diagnosis not present

## 2016-09-17 DIAGNOSIS — I482 Chronic atrial fibrillation: Secondary | ICD-10-CM | POA: Diagnosis not present

## 2016-09-17 DIAGNOSIS — I509 Heart failure, unspecified: Secondary | ICD-10-CM | POA: Diagnosis not present

## 2016-09-17 DIAGNOSIS — I493 Ventricular premature depolarization: Secondary | ICD-10-CM

## 2016-09-17 HISTORY — DX: Rheumatic tricuspid insufficiency: I07.1

## 2016-09-17 HISTORY — DX: Pulmonary hypertension, unspecified: I27.20

## 2016-09-17 LAB — COMPREHENSIVE METABOLIC PANEL
ALBUMIN: 3.2 g/dL — AB (ref 3.5–5.0)
ALK PHOS: 54 U/L (ref 38–126)
ALT: 10 U/L — AB (ref 14–54)
AST: 27 U/L (ref 15–41)
Anion gap: 7 (ref 5–15)
BILIRUBIN TOTAL: 0.5 mg/dL (ref 0.3–1.2)
BUN: 10 mg/dL (ref 6–20)
CALCIUM: 8.5 mg/dL — AB (ref 8.9–10.3)
CO2: 25 mmol/L (ref 22–32)
CREATININE: 0.9 mg/dL (ref 0.44–1.00)
Chloride: 105 mmol/L (ref 101–111)
GFR calc Af Amer: >60 mL/min (ref >60)
GFR, EST NON AFRICAN AMERICAN: 57 mL/min — AB (ref >60)
GLUCOSE: 85 mg/dL (ref 65–99)
POTASSIUM: 3.6 mmol/L (ref 3.5–5.1)
Sodium: 137 mmol/L (ref 135–145)
TOTAL PROTEIN: 5.6 g/dL — AB (ref 6.5–8.1)

## 2016-09-17 LAB — LIPID PANEL
Cholesterol: 160 mg/dL (ref 0–200)
HDL: 47 mg/dL (ref >40)
LDL CALC: 98 mg/dL (ref 0–99)
TRIGLYCERIDES: 73 mg/dL (ref <150)
Total CHOL/HDL Ratio: 3.4 RATIO
VLDL: 15 mg/dL (ref 0–40)

## 2016-09-17 LAB — MAGNESIUM: MAGNESIUM: 1.8 mg/dL (ref 1.7–2.4)

## 2016-09-17 LAB — ECHOCARDIOGRAM COMPLETE: WEIGHTICAEL: 2084.67 [oz_av]

## 2016-09-17 LAB — TROPONIN I: Troponin I: <0.03 ng/mL (ref <0.03)

## 2016-09-17 MED ORDER — MAGNESIUM OXIDE 400 (241.3 MG) MG PO TABS: 400.0000 mg | ORAL_TABLET | Freq: Every day | ORAL | 3 refills | Status: DC

## 2016-09-17 MED ORDER — POTASSIUM CHLORIDE CRYS ER 20 MEQ PO TBCR: 20.0000 meq | EXTENDED_RELEASE_TABLET | Freq: Every day | ORAL | Status: DC

## 2016-09-17 MED ORDER — RIVAROXABAN 15 MG PO TABS
15.0000 mg | ORAL_TABLET | Freq: Every day | ORAL | 3 refills | Status: DC
Start: 2016-09-17 — End: 2016-10-17

## 2016-09-17 MED ORDER — POTASSIUM CHLORIDE CRYS ER 20 MEQ PO TBCR: 20.0000 meq | EXTENDED_RELEASE_TABLET | Freq: Every day | ORAL | 3 refills | Status: DC

## 2016-09-17 MED ORDER — AMLODIPINE BESYLATE 2.5 MG PO TABS: 2.5000 mg | ORAL_TABLET | Freq: Every day | ORAL | 3 refills | Status: DC

## 2016-09-17 MED ORDER — CARVEDILOL 3.125 MG PO TABS: 3.1250 mg | ORAL_TABLET | Freq: Two times a day (BID) | ORAL | 3 refills | Status: DC

## 2016-09-17 MED ORDER — RANOLAZINE ER 500 MG PO TB12: 500.0000 mg | ORAL_TABLET | Freq: Two times a day (BID) | ORAL | 3 refills | Status: DC

## 2016-09-17 MED ORDER — PRAVASTATIN SODIUM 40 MG PO TABS: 40.0000 mg | ORAL_TABLET | Freq: Every day | ORAL | 3 refills | Status: DC

## 2016-09-17 NOTE — Progress Notes (Signed)
Progress Note  Patient Name: Tiffany Charles Date of Encounter: 09/17/2016  Primary Cardiologist: [place]  Subjective   Feeling well, no complaints. Much better than yesterday. No further CP. Just got back from echo.  Inpatient Medications    Scheduled Meds: . amLODipine  2.5 mg Oral Daily  . aspirin EC  81 mg Oral QODAY  . carvedilol  3.125 mg Oral BID WC  . pravastatin  40 mg Oral QHS  . ranolazine  500 mg Oral BID  . Rivaroxaban  15 mg Oral QAC supper  . sodium chloride flush  3 mL Intravenous Q12H   Continuous Infusions: . sodium chloride     PRN Meds: sodium chloride, acetaminophen, acetaminophen, ALPRAZolam, diphenhydrAMINE, nitroGLYCERIN, ondansetron (ZOFRAN) IV, sodium chloride flush, zolpidem   Vital Signs    Vitals:   09/16/16 1930 09/16/16 2115 09/17/16 0431 09/17/16 0917  BP: (!) 149/94 (!) 161/94 135/64 (!) 126/56  Pulse: 87 76 85 65  Resp: (!) 21 16 18    Temp:  97.5 F (36.4 C) 97.9 F (36.6 C)   TempSrc:  Oral Oral   SpO2: 98% 99% 98%   Weight:  130 lb 4.7 oz (59.1 kg) 130 lb 4.7 oz (59.1 kg)    No intake or output data in the 24 hours ending 09/17/16 1221 Filed Weights   09/16/16 2115 09/17/16 0431  Weight: 130 lb 4.7 oz (59.1 kg) 130 lb 4.7 oz (59.1 kg)    Telemetry    Atrial fib rate controlled frequent PVCs and intermittent bigeminy - Personally Reviewed  Physical Exam   GEN: No acute distress.  HEENT: Normocephalic, atraumatic, sclera non-icteric. Neck: No JVD or bruits. Cardiac: Irregularly irregular, rate controlled, no murmurs, rubs, or gallops.  Radials/DP/PT 1+ and equal bilaterally.  Respiratory: Clear to auscultation bilaterally. Breathing is unlabored. GI: Soft, nontender, non-distended, BS +x 4. MS: no deformity. Extremities: No clubbing or cyanosis. No edema. Distal pedal pulses are 2+ and equal bilaterally. Neuro:  AAOx3. Follows commands. Psych:  Responds to questions appropriately with a normal affect.  Labs      Chemistry Recent Labs Lab 09/16/16 1238 09/17/16 0308  NA 137 137  K 3.8 3.6  CL 105 105  CO2 25 25  GLUCOSE 135* 85  BUN 11 10  CREATININE 1.04* 0.90  CALCIUM 9.1 8.5*  PROT  --  5.6*  ALBUMIN  --  3.2*  AST  --  27  ALT  --  10*  ALKPHOS  --  54  BILITOT  --  0.5  GFRNONAA 48* 57*  GFRAA 56* >60  ANIONGAP 7 7     Hematology Recent Labs Lab 09/16/16 1238  WBC 8.2  RBC 4.64  HGB 14.9  HCT 42.9  MCV 92.5  MCH 32.1  MCHC 34.7  RDW 13.5  PLT 162    Cardiac Enzymes Recent Labs Lab 09/16/16 2227 09/17/16 0308 09/17/16 0857  TROPONINI <0.03 <0.03 <0.03    Recent Labs Lab 09/16/16 1246  TROPIPOC 0.01     BNPNo results for input(s): BNP, PROBNP in the last 168 hours.   DDimer No results for input(s): DDIMER in the last 168 hours.   Radiology    Dg Chest 2 View  Result Date: 09/16/2016 CLINICAL DATA:  Abnormal EKG EXAM: CHEST  2 VIEW COMPARISON:  08/09/2015 FINDINGS: Borderline cardiomegaly, stable. Stable mild aortic tortuosity. Negative hila. Chronic interstitial coarsening without Kerley lines or air bronchogram. No effusion or pneumothorax. IMPRESSION: Stable compared to prior.  No evidence  of active disease. Electronically Signed   By: Monte Fantasia M.D.   On: 09/16/2016 13:36    Cardiac Studies   Echo pending  Patient Profile     83 y.o. female with CAD s/p RCA stent 2008 ( last cath 02/2009: 30% mLAD, 90% AV groove cx unchanged from prior, 50-60% RCA, EF 60% -> med rx), chronic diastolic CHF, HTN, HLD, permanent atrial fib on Xarelto, COPD, moderate pulm HTN, medication noncompliance admitted with chest pain in the setting of not taking medications for several months. Also feeling more tired and SOB lately. Last echo 2014 with EF 55-60%, grade 3 DD, moderate MR, severe TR, PASP 43.  Assessment & Plan    1. Chest pain/dyspnea in the setting of known CAD - improved with resumption of meds. Awaiting echo. ?DC today based on result. Can consider  ischemic eval as OP if sx continue despite meds.  2. Chronic atrial fibrillation, with presumably new frequent PVCs and ventricular bigeminy  - PVCs could be contributing to presentation. Continue new addition of BB. Add low dose potassium to a goal K of 4.0. Add Mg level. May consider OP holter to assess total PVC burden. She is on Xarelto 15mg  here - CrCl on adm was 44, today is 50.9 (right above cutoff for 20mg  daily). Will need final rec from MD about dose.  3. Essential HTN - improved, caution with med titration given recent fatigue.  4. COPD/ongoing tobacco abuse/mod pulm HTN likely partly due to this - cessation advised.  5. Chronic diastolic CHF - appears euvolemic.  6. Hyperlipidemia - remains on pravastatin for now; with known CAD would consider changing to high intensity statin.  Signed, Charlie Pitter, PA-C - note pended incomplete until patient returns from echo 09/17/2016, 12:21 PM    The patient was seen, examined and discussed with Melina Copa, PA-C and I agree with the above.   83 y.o. female with a history of D-CHF, HTN, HLD, perm Afib on Xarelto, COPD, CAD w/ stent RCA and med rx for residual dz distal CFX on Ranexa. She was seen by a new PCP today who was concerned about abnormal ECG and called her son to bring her to the ER.  She states that she has had no chest pain, but she has noticed to feel more tired and slightly more SOB on exertion. She is fully independent, she walks, cooks, does house chores.  She hasn't been taking her medications as she doesn't like to take them including anticoagulation. She has had dry cough but no no fever chills, no falls, dizziness, no LE edema.  Echo from 2014 shows LVEF 60-65%, stage 3 DD, mild pulmonary hypertension. On physical exam, she has minimal crakles at the lung bases, no LE edema. BP 150/82 mmHg. Troponin is negative x 1, ECG shows a-fib with RVR - ventricular rate 107 and frequent PVCs.   We restarted aspirin, pravastatin,  xarelto, ranolazine and amlodipine 2.5 mg po daily, held digoxin, started carvedilol 3.125 mg po BID. Troponin neg x 3, ECGs unchanged, telemetry shows a-fib with controlled rate.   Her echo shows LVEF 55-60%, LA mod dilated, mild MR, moderate TR, mild pulmonary hypertension.  She is chest pain free, no ischemic workup is needed. The mainstay will be medication compliance. We decided for Xarelto 15 mg po daily considering borderline GFR.   Ena Dawley, MD 09/17/2016

## 2016-09-17 NOTE — Discharge Summary (Signed)
Discharge Summary    Patient ID: Tiffany Charles,  MRN: [id number], DOB/AGE: [birth year] 83 y.o.  Admit date: 09/16/2016 Discharge date: 09/17/2016  Primary Care Provider: Scarlette Calico Primary Cardiologist: Dr. Sallyanne Kuster  Discharge Diagnoses    Principal Problem:   Chest pain with moderate risk for cardiac etiology Active Problems:   Permanent atrial fibrillation [facility])   COPD - PFTs in June 2014 with MET test   CAD, RCA BMS '09, 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 low risk Myoview Feb 2013   Hyperlipidemia   Chronic anticoagulation- Xarelto   Chronic diastolic (congestive) heart failure [facility])   Essential hypertension   Fatigue   Dyspnea   Frequent PVCs   Diagnostic Studies/Procedures    2D Echo 09/17/16 Study Conclusions  - Left ventricle: The cavity size was normal. Wall thickness was   increased in a pattern of mild LVH. Indeterminant diastolic   function (atrial fibrillation). Systolic function was normal. The   estimated ejection fraction was in the range of 55% to 60%. Wall   motion was normal; there were no regional wall motion   abnormalities. - Aortic valve: There was no stenosis. - Mitral valve: There was mild regurgitation. - Left atrium: The atrium was severely dilated. - Right ventricle: The cavity size was normal. Systolic function   was mildly reduced. - Right atrium: The atrium was moderately dilated. - Tricuspid valve: There was moderate regurgitation. Peak RV-RA   gradient (S): 32 mm Hg. - Pulmonary arteries: PA peak pressure: 40 mm Hg (S). - Systemic veins: IVC measured 1.9 cm with < 50% respirophasic   variation, suggesting RA pressure 8 mmHg.  Impressions:  - The patient was in atrial fibrillation. Normal LV size with mild   LV hypertrophy. EF 55-60%. Normal RV size with mildly decreased   systolic function. Biatrial enlargement. Mild MR, moderate TR.   Mild pulmonary hypertension. _____________     History of Present Illness     Tiffany Charles is a 83 y.o. female with history of CAD s/p RCA stent 2008 ( last cath 02/2009: 30% mLAD, 90% AV groove cx unchanged from prior, 50-60% RCA, EF 60% -> med rx), chronic diastolic CHF, HTN, HLD, permanent atrial fib on Xarelto, COPD, moderate pulm HTN, medication noncompliance admitted with chest pain in the setting of not taking medications for several months. Also feeling more tired and SOB lately. She told Dr. Meda Coffee she does not believe in medications much. She was admitted for further evaluation.  Hospital Course    Troponins remained neg x4. LDL 98, CBC wnl, TSH wnl. She was started back on lower dose carvedilol, amlodipine, Ranexa, pravastatin with caution not to lower BP too much. Xarelto 14m dose was chosen given decreased CrCl on admission of 44 (cr 1.09) - f/u Cr today was 0.9 and thus right on the border for 15-228m She will need f/u BMET as OP and attention to this at time of follow-up. Her med list previously had the 1558mose. Of note she was having frequent PVCs and bigeminy on telemetry. She was started on potassium and magnesium given K of 3.8 and Mg 1.8. Will need f/u lytes as OP. Per Dr. NelMeda Coffeeer echo shows LVEF 55-60%, LA mod dilated, mild MR, moderate TR, mild pulmonary hypertension. She's had no further sx with resumption of meds. Dr. NelMeda Coffees seen and examined the patient today and feels she is stable for discharge. If she has persistent sx may consider OP Holter to assess PVC  burden but hopeful these will improve with compliance above.  Consultants: N/A  _____________  Discharge Vitals Blood pressure 114/72, pulse 86, temperature 97.9 F (36.6 C), temperature source Oral, resp. rate 18, weight 130 lb 4.7 oz (59.1 kg), SpO2 98 %.  Filed Weights   09/16/16 2115 09/17/16 0431  Weight: 130 lb 4.7 oz (59.1 kg) 130 lb 4.7 oz (59.1 kg)    Labs & Radiologic Studies    CBC  Recent Labs  09/16/16 1238  WBC 8.2  HGB 14.9  HCT 42.9  MCV 92.5  PLT 956    Basic Metabolic Panel  Recent Labs  09/16/16 1238 09/17/16 0308 09/17/16 1230  NA 137 137  --   K 3.8 3.6  --   CL 105 105  --   CO2 25 25  --   GLUCOSE 135* 85  --   BUN 11 10  --   CREATININE 1.04* 0.90  --   CALCIUM 9.1 8.5*  --   MG  --   --  1.8   Liver Function Tests  Recent Labs  09/17/16 0308  AST 27  ALT 10*  ALKPHOS 54  BILITOT 0.5  PROT 5.6*  ALBUMIN 3.2*   Cardiac Enzymes  Recent Labs  09/16/16 2227 09/17/16 0308 09/17/16 0857  TROPONINI <0.03 <0.03 <0.03   Fasting Lipid Panel  Recent Labs  09/17/16 0308  CHOL 160  HDL 47  LDLCALC 98  TRIG 73  CHOLHDL 3.4   Thyroid Function Tests  Recent Labs  09/16/16 2227  TSH 1.470   _____________  Dg Chest 2 View  Result Date: 09/16/2016 CLINICAL DATA:  Abnormal EKG EXAM: CHEST  2 VIEW COMPARISON:  08/09/2015 FINDINGS: Borderline cardiomegaly, stable. Stable mild aortic tortuosity. Negative hila. Chronic interstitial coarsening without Kerley lines or air bronchogram. No effusion or pneumothorax. IMPRESSION: Stable compared to prior.  No evidence of active disease. Electronically Signed   By: Monte Fantasia M.D.   On: 09/16/2016 13:36   Disposition   Pt is being discharged home today in good condition.  Follow-up Plans & Appointments    Follow-up Information    Barrett, Suanne Marker, PA-C Follow up.   Specialties:  Cardiology, Radiology Why:  [facility] - 10/06/16 at [place] at 8:45am. Rosaria Ferries is one of the PAs that works with Dr. Sallyanne Kuster.  Contact information: [address] [place] [address] [phone]          Discharge Instructions    Call MD for:  difficulty breathing, headache or visual disturbances    Complete by:  As directed    Call MD for:  extreme fatigue    Complete by:  As directed    Call MD for:  persistant dizziness or light-headedness    Complete by:  As directed    Call MD for:  severe uncontrolled pain    Complete by:  As  directed    Diet - low sodium heart healthy    Complete by:  As directed    Increase activity slowly    Complete by:  As directed    We have restarted several of your medicines, some at lower doses than before. You were also started on magnesium and potassium as these levels were borderline low.      Discharge Medications    The medicines listed below are ones she was not taking at home and were not continued as inpatient.  Allergies as of 09/17/2016      Reactions  Codeine Nausea And Vomiting      Medication List    STOP taking these medications   citalopram 20 MG tablet Commonly known as:  CELEXA   digoxin 0.125 MG tablet Commonly known as:  LANOXIN   Tiotropium Bromide Monohydrate 2.5 MCG/ACT Aers Commonly known as:  SPIRIVA RESPIMAT   traMADol 50 MG tablet Commonly known as:  ULTRAM     TAKE these medications   albuterol 108 (90 Base) MCG/ACT inhaler Commonly known as:  PROVENTIL HFA;VENTOLIN HFA Inhale 2 puffs into the lungs every 6 (six) hours as needed for wheezing.   amLODipine 2.5 MG tablet Commonly known as:  NORVASC Take 1 tablet (2.5 mg total) by mouth daily. What changed:  medication strength  how much to take   aspirin EC 81 MG tablet Take 81 mg by mouth every other day.   carvedilol 3.125 MG tablet Commonly known as:  COREG Take 1 tablet (3.125 mg total) by mouth 2 (two) times daily with a meal.   diphenhydramine-acetaminophen 25-500 MG Tabs tablet Commonly known as:  TYLENOL PM Take 1 tablet by mouth at bedtime as needed (sleep).   magnesium oxide 400 (241.3 Mg) MG tablet Commonly known as:  MAG-OX Take 1 tablet (400 mg total) by mouth daily.   nitroGLYCERIN 0.4 MG/SPRAY spray Commonly known as:  NITROLINGUAL Place 1 spray under the tongue every 5 (five) minutes as needed for chest pain.   potassium chloride SA 20 MEQ tablet Commonly known as:  K-DUR,KLOR-CON Take 1 tablet (20 mEq total) by mouth daily.   pravastatin 40 MG  tablet Commonly known as:  PRAVACHOL Take 1 tablet (40 mg total) by mouth at bedtime. What changed:  medication strength  how much to take   ranolazine 500 MG 12 hr tablet Commonly known as:  RANEXA Take 1 tablet (500 mg total) by mouth 2 (two) times daily. What changed:  medication strength  how much to take   Rivaroxaban 15 MG Tabs tablet Commonly known as:  XARELTO Take 1 tablet (15 mg total) by mouth daily before supper.   VITAMIN C PO Take 1 tablet by mouth daily as needed (for immune support).        Allergies:  Allergies  Allergen Reactions  . Codeine Nausea And Vomiting     Outstanding Labs/Studies   See above.  Duration of Discharge Encounter   Greater than 30 minutes including physician time.  Signed, Charlie Pitter PA-C 09/17/2016, 3:49 PM

## 2016-09-17 NOTE — Progress Notes (Signed)
Notified by CCMD that pt was having consistent runs of bigeminy. Pt asymptomatic. MD notified. Will continue to monitor.

## 2016-09-17 NOTE — Progress Notes (Signed)
  Echocardiogram 2D Echocardiogram has been performed.  Tiffany Charles 09/17/2016, 12:48 PM

## 2016-09-17 NOTE — Discharge Instructions (Signed)

## 2016-09-18 ENCOUNTER — Telehealth: Payer: Self-pay | Admitting: *Deleted

## 2016-09-18 NOTE — Telephone Encounter (Signed)
Pt was on the TCM list admitted 09/16/16 for Chest pain with moderate risk for cardiac etiology. Had labs and echo done. Pt was D/c 4/25. And will f/u w/cardiology  Follow-up Information    Barrett, Suanne Marker, PA-C Follow up.   Specialties:  Cardiology, Radiology Why:  [facility] - 10/06/16 at [place] at 8:45am. Rosaria Ferries is one of the PAs that works with Dr. Sallyanne Kuster.

## 2016-10-06 ENCOUNTER — Ambulatory Visit: Payer: Medicare HMO | Admitting: Physician Assistant

## 2016-10-09 ENCOUNTER — Ambulatory Visit: Payer: Medicare HMO | Admitting: Physician Assistant

## 2016-10-17 ENCOUNTER — Ambulatory Visit (INDEPENDENT_AMBULATORY_CARE_PROVIDER_SITE_OTHER): Payer: Medicare HMO | Admitting: Physician Assistant

## 2016-10-17 ENCOUNTER — Encounter: Payer: Self-pay | Admitting: Physician Assistant

## 2016-10-17 VITALS — BP 122/72 | HR 50 | Ht 67.0 in | Wt 127.6 lb

## 2016-10-17 DIAGNOSIS — I10 Essential (primary) hypertension: Secondary | ICD-10-CM | POA: Diagnosis not present

## 2016-10-17 DIAGNOSIS — Z9119 Patient's noncompliance with other medical treatment and regimen: Secondary | ICD-10-CM | POA: Diagnosis not present

## 2016-10-17 DIAGNOSIS — I2510 Atherosclerotic heart disease of native coronary artery without angina pectoris: Secondary | ICD-10-CM

## 2016-10-17 DIAGNOSIS — L989 Disorder of the skin and subcutaneous tissue, unspecified: Secondary | ICD-10-CM | POA: Diagnosis not present

## 2016-10-17 DIAGNOSIS — I4821 Permanent atrial fibrillation: Secondary | ICD-10-CM

## 2016-10-17 DIAGNOSIS — E7800 Pure hypercholesterolemia, unspecified: Secondary | ICD-10-CM

## 2016-10-17 DIAGNOSIS — I482 Chronic atrial fibrillation: Secondary | ICD-10-CM | POA: Diagnosis not present

## 2016-10-17 DIAGNOSIS — Z91199 Patient's noncompliance with other medical treatment and regimen due to unspecified reason: Secondary | ICD-10-CM

## 2016-10-17 LAB — BASIC METABOLIC PANEL
BUN/Creatinine Ratio: 20 (ref 12–28)
BUN: 17 mg/dL (ref 8–27)
CALCIUM: 9.4 mg/dL (ref 8.7–10.3)
CO2: 24 mmol/L (ref 18–29)
CREATININE: 0.84 mg/dL (ref 0.57–1.00)
Chloride: 101 mmol/L (ref 96–106)
GFR calc Af Amer: 74 mL/min/{1.73_m2} (ref >59)
GFR, EST NON AFRICAN AMERICAN: 64 mL/min/{1.73_m2} (ref >59)
Glucose: 73 mg/dL (ref 65–99)
POTASSIUM: 4.5 mmol/L (ref 3.5–5.2)
Sodium: 139 mmol/L (ref 134–144)

## 2016-10-17 MED ORDER — PRAVASTATIN SODIUM 40 MG PO TABS: 40.0000 mg | ORAL_TABLET | Freq: Every day | ORAL | 11 refills | Status: DC

## 2016-10-17 MED ORDER — RIVAROXABAN 15 MG PO TABS: 15.0000 mg | ORAL_TABLET | Freq: Every day | ORAL | 11 refills | Status: DC

## 2016-10-17 MED ORDER — NITROGLYCERIN 0.4 MG/SPRAY TL SOLN: 1.0000 | 1 refills | Status: DC | PRN

## 2016-10-17 NOTE — Progress Notes (Signed)
Cardiology Office Note    Date:  10/18/2016   ID:  Tiffany Charles, DOB 19-Sep-1929, MRN [id number]  PCP:  [name], MD  Cardiologist:  Dr. Sallyanne Kuster  Chief Complaint  Patient presents with  . Follow-up    seen for Dr. Sallyanne Kuster    History of Present Illness:  Tiffany Charles is a 83 y.o. female with PMH of diastolic HF, HTN, HLD, permanent atrial fibrillation with Xarelto, COPD, CAD s/p PCI to RCA with medical Rx for disease LCx disease. She was recently sent to the hospital on 09/16/2013 for evaluation of chest pain. It was also noted, she has been off of most of her cardiac medication for quite some time. Her cardiac medications include aspirin, pravastatin, Xarelto, Ranexa, carvedilol and amlodipine was restarted. Her Xarelto was started at 15 mg daily based on borderline GFR. Echocardiogram obtained on 09/17/2016 showed EF 55-60%, mild LVH, mild MR, severely dilated left atrium, moderately dilated right atrium, PA peak pressure 40 mmHg. Her chest pain resolved and she was discharged on 09/17/2016. If she continues to have chest discomfort, then can consider ischemic workup or holter monitor to assess PVC burden as outpatient.  She has only restarted on some of her medication. Since she left the hospital, she has not had any further chest pain. She is not currently taking her Xarelto, we have given her some samples and another new prescriptions well. She also has not restarted on her Pravachol either. She is taking potassium supplement, I will check a basic metabolic panel today. She is still smoking, we discussed the need for tobacco cessation. I discontinued her aspirin given the need for Xarelto. I have also given her a prescription for sublingual nitroglycerin to use as needed. She denies any bleeding issue while on Xarelto, since her renal function is borderline, she is on 15 mg daily dosing. I will see her back in 6-8 weeks for reassessment.   Past Medical History:  Diagnosis Date    . Angina pectoris   . Arthritis   . Chronic anticoagulation    Xarelto  . Chronic diastolic (congestive) heart failure ([place])   . COPD (chronic obstructive pulmonary disease) ([place])   . Coronary artery disease    a. s/p RCA stent 2008. b. cath 02/2009: 30% mLAD, 90% AV groove cx unchanged from prior, 50-60% RCA, EF 60% -> med rx.  . Depression   . Essential hypertension   . Hyperlipidemia   . Mild pulmonary hypertension ([place]) 09/17/2016  . Permanent atrial fibrillation ([place])   . Skin cancer Jan 2013   squamous cell- removed  . Stented coronary artery 2009   RCA stent  . Tricuspid regurgitation 09/17/2016    Past Surgical History:  Procedure Laterality Date  .  CATARACTS REMOVED    . ABDOMINAL HYSTERECTOMY    . CARDIAC CATHETERIZATION  03/22/2009   Started medical therapy  . CARDIOVERSION  06/05/2009   3 attempts, last attempt successful  . CORONARY ANGIOPLASTY WITH STENT PLACEMENT  2009   RCA Vision stent  . HIP SURGERY    . MELANOMA EXCISION  06/06/2011   Procedure: MELANOMA EXCISION;  Surgeon: Adin Hector, MD;  Location: [facility] ORS;  Service: General;  Laterality: Left;  re excision squamous cell lesion left chest wall     Current Medications: Outpatient Medications Prior to Visit  Medication Sig Dispense Refill  . carvedilol (COREG) 3.125 MG tablet Take 1 tablet (3.125 mg total) by mouth 2 (two) times daily with a  meal. 60 tablet 3  . potassium chloride SA (K-DUR,KLOR-CON) 20 MEQ tablet Take 1 tablet (20 mEq total) by mouth daily. 30 tablet 3  . ranolazine (RANEXA) 500 MG 12 hr tablet Take 1 tablet (500 mg total) by mouth 2 (two) times daily. 60 tablet 3  . albuterol (PROVENTIL HFA;VENTOLIN HFA) 108 (90 BASE) MCG/ACT inhaler Inhale 2 puffs into the lungs every 6 (six) hours as needed for wheezing.    Marland Kitchen amLODipine (NORVASC) 2.5 MG tablet Take 1 tablet (2.5 mg total) by mouth daily. 30 tablet 3  . Ascorbic Acid (VITAMIN C PO) Take 1 tablet by mouth daily as needed (for immune  support).     Marland Kitchen aspirin EC 81 MG tablet Take 81 mg by mouth every other day.     . diphenhydramine-acetaminophen (TYLENOL PM) 25-500 MG TABS tablet Take 1 tablet by mouth at bedtime as needed (sleep).    . magnesium oxide (MAG-OX) 400 (241.3 Mg) MG tablet Take 1 tablet (400 mg total) by mouth daily. 30 tablet 3  . nitroGLYCERIN (NITROLINGUAL) 0.4 MG/SPRAY spray Place 1 spray under the tongue every 5 (five) minutes as needed for chest pain.    . pravastatin (PRAVACHOL) 40 MG tablet Take 1 tablet (40 mg total) by mouth at bedtime. 30 tablet 3  . Rivaroxaban (XARELTO) 15 MG TABS tablet Take 1 tablet (15 mg total) by mouth daily before supper. 30 tablet 3   No facility-administered medications prior to visit.      Allergies:   Codeine   Social History   Social History  . Marital status: Widowed    Spouse name: N/A  . Number of children: N/A  . Years of education: N/A   Occupational History  . Retired    Social History Main Topics  . Smoking status: Former Smoker    Quit date: 05/28/2010  . Smokeless tobacco: Never Used  . Alcohol use No  . Drug use: No  . Sexual activity: No   Other Topics Concern  . None   Social History Narrative   Pt lives alone, son is nearby.     Family History:  The patient's family history includes Coronary artery disease in her father; Heart disease in her brother, father, and mother; Stroke in her mother, sister, sister, and sister.   ROS:   Please see the history of present illness.    ROS All other systems reviewed and are negative.   PHYSICAL EXAM:   VS:  BP 122/72   Pulse (!) 50   Ht 5\' 7"  (1.702 m)   Wt 127 lb 9.6 oz (57.9 kg)   BMI 19.98 kg/m    GEN: Well nourished, well developed, in no acute distress  HEENT: normal  Neck: no JVD, carotid bruits, or masses Cardiac: Irregularly irregular; no murmurs, rubs, or gallops,no edema  Respiratory:  clear to auscultation bilaterally, normal work of breathing GI: soft, nontender, nondistended,  + BS MS: no deformity or atrophy  Skin: warm and dry, no rash. Skin nodule noted on L lateral lower leg Neuro:  Alert and Oriented x 3, Strength and sensation are intact Psych: euthymic mood, full affect  Wt Readings from Last 3 Encounters:  10/17/16 127 lb 9.6 oz (57.9 kg)  09/17/16 130 lb 4.7 oz (59.1 kg)  08/30/15 128 lb (58.1 kg)      Studies/Labs Reviewed:   EKG:  EKG is ordered today.  The ekg ordered today demonstrates Atrial fibrillation with PVCs.  Recent Labs: 09/16/2016: Hemoglobin 14.9;  Platelets 162; TSH 1.470 09/17/2016: ALT 10; Magnesium 1.8 10/17/2016: BUN 17; Creatinine, Ser 0.84; Potassium 4.5; Sodium 139   Lipid Panel    Component Value Date/Time   CHOL 160 09/17/2016 0308   TRIG 73 09/17/2016 0308   HDL 47 09/17/2016 0308   CHOLHDL 3.4 09/17/2016 0308   VLDL 15 09/17/2016 0308   LDLCALC 98 09/17/2016 0308    Additional studies/ records that were reviewed today include:   Cardiac Cath: 05/2007 ANGIOGRAPHIC DATA: Left ventricle: The left ventricular systolic function was normal with ejection fraction of 60%. There was no significant wall motion abnormality. There was no significant mitral regurgitation. Right coronary artery: The right coronary artery is a large vessel and dominant vessel. It has mild to moderate diffuse luminal irregularities in the proximal and mid segment constituting 40-50% stenosis. Distal right coronary artery had an 80% stenosis. It gave origin to two PDA branches. The proximal PDA branch has an ostial 70% stenosis unchanged from prior cardiac catheterization. PLA branch is large. This is smooth and normal. Left main coronary artery: The left main coronary is a large vessel. It has a proximal 10-15% stenosis which is calcified. Circumflex artery: The circumflex artery is a moderate-caliber vessel. Gives origin to a large obtuse marginal-1. The distal circumflex after the origin of the obtuse  marginal-1 had a focal 90% stenosis. However, this vessel was approximately about 2.0 mm in diameter. This distal circumflex received collaterals from the right coronary artery. LAD: The LAD is a large vessel. It gives origin to several small diagonals. It has a 40% mid stenosis. Otherwise, it has mild diffuse luminal irregularity. INTERVENTION DATA: Successful PTCA and direct stenting of the distal right coronary artery with implantation of a 3.5 x 23 mm Vision stent. This stent was deployed at 16 atmospheres pressure giving a 3.9 mm diameter. Post stent implantation angiography revealed excellent results.   Echo 09/17/2016 LV EF: 55% -   60%  ------------------------------------------------------------------- Indications:      CHF - 428.0.  ------------------------------------------------------------------- History:   PMH:   Atrial fibrillation.  Coronary artery disease. Congestive heart failure.  Chronic obstructive pulmonary disease.   ------------------------------------------------------------------- Study Conclusions  - Left ventricle: The cavity size was normal. Wall thickness was   increased in a pattern of mild LVH. Indeterminant diastolic   function (atrial fibrillation). Systolic function was normal. The   estimated ejection fraction was in the range of 55% to 60%. Wall   motion was normal; there were no regional wall motion   abnormalities. - Aortic valve: There was no stenosis. - Mitral valve: There was mild regurgitation. - Left atrium: The atrium was severely dilated. - Right ventricle: The cavity size was normal. Systolic function   was mildly reduced. - Right atrium: The atrium was moderately dilated. - Tricuspid valve: There was moderate regurgitation. Peak RV-RA   gradient (S): 32 mm Hg. - Pulmonary arteries: PA peak pressure: 40 mm Hg (S). - Systemic veins: IVC measured 1.9 cm with < 50% respirophasic   variation, suggesting RA  pressure 8 mmHg.  Impressions:  - The patient was in atrial fibrillation. Normal LV size with mild   LV hypertrophy. EF 55-60%. Normal RV size with mildly decreased   systolic function. Biatrial enlargement. Mild MR, moderate TR.   Mild pulmonary hypertension.  ASSESSMENT:    1. Permanent atrial fibrillation ([place])   2. Pure hypercholesterolemia   3. Essential hypertension   4. Coronary artery disease involving native coronary artery of native heart without angina  pectoris   5. Noncompliance   6. Skin lesion      PLAN:  In order of problems listed above:  1. Permanent atrial fibrillation: Rate controlled, unfortunately, despite the fact her Xarelto was resumed in the recent hospitalization, it appears she is no longer taking it again. She says she run out of the medication and was not given another prescription in the hospital. We have given her samples and also another prescription. Emphasis has been placed on the need for Xarelto to decrease the risk of stroke event permanent atrial fibrillation. Otherwise her heart rate is very well controlled on the low-dose carvedilol. Despite initial assessment that her heart rate was 50 bpm, on physical exam, her heart rate is actually faster than that, therefore I repeated an EKG, it does show that her heart rate is mainly in the 70s to 80s.  2. CAD: She has not had any further chest discomfort since left the hospital. We will attempt to reconsult of her medication first, and hold off on any ischemic workup. If she does have any recurrence, low threshold to proceed with stress test. Ideally, she need to demonstrate compliance first.  3. Hypertension: Blood pressure well controlled on amlodipine and carvedilol.  4. Hyperlipidemia: Restart Pravachol. I will see the patient back in 6 weeks, she will need a fasting lipid panel and LFTs at the time.  5. Skin lesion: noticeable nodule in the left lower extremity on the lateral side. It has black  discoloration near the top. Her son will take the patient to a dermatologist to make sure it is not a skin cancer    Medication Adjustments/Labs and Tests Ordered: Current medicines are reviewed at length with the patient today.  Concerns regarding medicines are outlined above.  Medication changes, Labs and Tests ordered today are listed in the Patient Instructions below. Patient Instructions  Medication Instructions:  STOP- Aspirin RE-START: Pravastatin 40 mg daily and Xarelto 15 mg daily  Labwork: BMP today Fasting Lipids and Liver in 6 weeks  Testing/Procedures: None Ordered  Follow-Up: Your physician recommends that you schedule a follow-up appointment in: 6 Weeks.   Any Other Special Instructions Will Be Listed Below (If Applicable).   If you need a refill on your cardiac medications before your next appointment, please call your pharmacy.      Hilbert Corrigan, [place]  10/18/2016 1:03 PM    [facility] [place], [place], [place]  [address] Phone: [phone]; Fax: [phone]

## 2016-10-17 NOTE — Patient Instructions (Signed)
Medication Instructions:  STOP- Aspirin RE-START: Pravastatin 40 mg daily and Xarelto 15 mg daily  Labwork: BMP today Fasting Lipids and Liver in 6 weeks  Testing/Procedures: None Ordered  Follow-Up: Your physician recommends that you schedule a follow-up appointment in: 6 Weeks.   Any Other Special Instructions Will Be Listed Below (If Applicable).   If you need a refill on your cardiac medications before your next appointment, please call your pharmacy.

## 2016-10-18 ENCOUNTER — Encounter: Payer: Self-pay | Admitting: Physician Assistant

## 2016-10-22 ENCOUNTER — Telehealth: Payer: Self-pay | Admitting: Physician Assistant

## 2016-10-22 NOTE — Telephone Encounter (Signed)
Error

## 2016-10-23 DIAGNOSIS — Z85828 Personal history of other malignant neoplasm of skin: Secondary | ICD-10-CM | POA: Diagnosis not present

## 2016-10-23 DIAGNOSIS — L720 Epidermal cyst: Secondary | ICD-10-CM | POA: Diagnosis not present

## 2016-10-23 DIAGNOSIS — C44619 Basal cell carcinoma of skin of left upper limb, including shoulder: Secondary | ICD-10-CM | POA: Diagnosis not present

## 2016-10-23 DIAGNOSIS — L820 Inflamed seborrheic keratosis: Secondary | ICD-10-CM | POA: Diagnosis not present

## 2016-10-23 DIAGNOSIS — D0462 Carcinoma in situ of skin of left upper limb, including shoulder: Secondary | ICD-10-CM | POA: Diagnosis not present

## 2016-10-23 DIAGNOSIS — D1801 Hemangioma of skin and subcutaneous tissue: Secondary | ICD-10-CM | POA: Diagnosis not present

## 2016-10-23 DIAGNOSIS — D485 Neoplasm of uncertain behavior of skin: Secondary | ICD-10-CM | POA: Diagnosis not present

## 2016-10-23 DIAGNOSIS — L821 Other seborrheic keratosis: Secondary | ICD-10-CM | POA: Diagnosis not present

## 2016-10-23 DIAGNOSIS — C44319 Basal cell carcinoma of skin of other parts of face: Secondary | ICD-10-CM | POA: Diagnosis not present

## 2016-11-11 DIAGNOSIS — Z85828 Personal history of other malignant neoplasm of skin: Secondary | ICD-10-CM | POA: Diagnosis not present

## 2016-11-11 DIAGNOSIS — C44319 Basal cell carcinoma of skin of other parts of face: Secondary | ICD-10-CM | POA: Diagnosis not present

## 2016-11-18 DIAGNOSIS — D0462 Carcinoma in situ of skin of left upper limb, including shoulder: Secondary | ICD-10-CM | POA: Diagnosis not present

## 2016-11-18 DIAGNOSIS — Z85828 Personal history of other malignant neoplasm of skin: Secondary | ICD-10-CM | POA: Diagnosis not present

## 2016-11-18 DIAGNOSIS — C44729 Squamous cell carcinoma of skin of left lower limb, including hip: Secondary | ICD-10-CM | POA: Diagnosis not present

## 2016-12-02 DIAGNOSIS — C44729 Squamous cell carcinoma of skin of left lower limb, including hip: Secondary | ICD-10-CM | POA: Diagnosis not present

## 2016-12-02 DIAGNOSIS — Z85828 Personal history of other malignant neoplasm of skin: Secondary | ICD-10-CM | POA: Diagnosis not present

## 2017-01-06 DIAGNOSIS — Z85828 Personal history of other malignant neoplasm of skin: Secondary | ICD-10-CM | POA: Diagnosis not present

## 2017-01-06 DIAGNOSIS — L570 Actinic keratosis: Secondary | ICD-10-CM | POA: Diagnosis not present

## 2017-01-06 DIAGNOSIS — C4442 Squamous cell carcinoma of skin of scalp and neck: Secondary | ICD-10-CM | POA: Diagnosis not present

## 2017-01-20 DIAGNOSIS — D0359 Melanoma in situ of other part of trunk: Secondary | ICD-10-CM | POA: Diagnosis not present

## 2017-01-20 DIAGNOSIS — Z85828 Personal history of other malignant neoplasm of skin: Secondary | ICD-10-CM | POA: Diagnosis not present

## 2017-01-20 DIAGNOSIS — L570 Actinic keratosis: Secondary | ICD-10-CM | POA: Diagnosis not present

## 2017-02-03 DIAGNOSIS — Z85828 Personal history of other malignant neoplasm of skin: Secondary | ICD-10-CM | POA: Diagnosis not present

## 2017-02-03 DIAGNOSIS — D0359 Melanoma in situ of other part of trunk: Secondary | ICD-10-CM | POA: Diagnosis not present

## 2017-02-03 DIAGNOSIS — L570 Actinic keratosis: Secondary | ICD-10-CM | POA: Diagnosis not present

## 2017-02-03 DIAGNOSIS — D0361 Melanoma in situ of right upper limb, including shoulder: Secondary | ICD-10-CM | POA: Diagnosis not present

## 2017-04-10 DIAGNOSIS — Z1231 Encounter for screening mammogram for malignant neoplasm of breast: Secondary | ICD-10-CM | POA: Diagnosis not present

## 2017-04-10 DIAGNOSIS — Z803 Family history of malignant neoplasm of breast: Secondary | ICD-10-CM | POA: Diagnosis not present

## 2017-04-10 LAB — HM MAMMOGRAPHY

## 2017-04-22 ENCOUNTER — Telehealth: Payer: Self-pay | Admitting: Cardiovascular Disease

## 2017-04-22 NOTE — Telephone Encounter (Signed)
New message   Patient son states pt felt dizzy and tired for about 1 week No CP, No shortness of breath. Please call   STAT if patient feels like he/she is going to faint   1) Are you dizzy now? Unsure pt asleep  2) Do you feel faint or have you passed out?  NO  3) Do you have any other symptoms? Dizzy and tired  4) Have you checked your HR and BP (record if available)?  NO

## 2017-04-22 NOTE — Telephone Encounter (Signed)
Pt of Dr. Sallyanne Kuster Last seen May 2018 w recommended return OV June/July 2018 - never followed up. Son called today to schedule for acute OV - APP  Returned call to son, pt added for appt tomorrow to discuss concern of dizziness, weakness intermittently x1 week. Pt noted no other symptoms when son was with her last night, he states pt asleep now and he will go over to see her later and reassess.  Pt does have A Fib hx and is on anticoagulation and BB therapy. Son states she does not keep track of her BP or HR readings at home. I advised to call back if able to obtain any VS readings or if she is verbalizing any other concerns such as palpitations, dyspnea, etc. He voiced understanding and thanks.

## 2017-04-23 ENCOUNTER — Encounter: Payer: Self-pay | Admitting: Physician Assistant

## 2017-04-23 ENCOUNTER — Ambulatory Visit: Payer: Medicare HMO | Admitting: Physician Assistant

## 2017-04-23 ENCOUNTER — Encounter: Payer: Self-pay | Admitting: Internal Medicine

## 2017-04-23 VITALS — BP 136/80 | HR 79 | Ht 67.0 in | Wt 128.0 lb

## 2017-04-23 DIAGNOSIS — Z9119 Patient's noncompliance with other medical treatment and regimen: Secondary | ICD-10-CM

## 2017-04-23 DIAGNOSIS — E785 Hyperlipidemia, unspecified: Secondary | ICD-10-CM | POA: Diagnosis not present

## 2017-04-23 DIAGNOSIS — I2510 Atherosclerotic heart disease of native coronary artery without angina pectoris: Secondary | ICD-10-CM

## 2017-04-23 DIAGNOSIS — I10 Essential (primary) hypertension: Secondary | ICD-10-CM | POA: Diagnosis not present

## 2017-04-23 DIAGNOSIS — Z91199 Patient's noncompliance with other medical treatment and regimen due to unspecified reason: Secondary | ICD-10-CM

## 2017-04-23 DIAGNOSIS — I482 Chronic atrial fibrillation: Secondary | ICD-10-CM | POA: Diagnosis not present

## 2017-04-23 DIAGNOSIS — I4821 Permanent atrial fibrillation: Secondary | ICD-10-CM

## 2017-04-23 DIAGNOSIS — R42 Dizziness and giddiness: Secondary | ICD-10-CM

## 2017-04-23 MED ORDER — PRAVASTATIN SODIUM 40 MG PO TABS: 40.0000 mg | ORAL_TABLET | Freq: Every day | ORAL | 11 refills | Status: DC

## 2017-04-23 MED ORDER — CARVEDILOL 3.125 MG PO TABS: 3.1250 mg | ORAL_TABLET | Freq: Two times a day (BID) | ORAL | 11 refills | Status: DC

## 2017-04-23 MED ORDER — RIVAROXABAN 15 MG PO TABS: 15.0000 mg | ORAL_TABLET | Freq: Every day | ORAL | 11 refills | Status: DC

## 2017-04-23 NOTE — Progress Notes (Signed)
Cardiology Office Note   Date:  04/23/2017   ID:  Tiffany Charles, DOB 04-26-1930, MRN [id number]  PCP:  Tiffany Lima, MD  Cardiologist: Dr. Sallyanne Kuster, 03/15/2015 Tiffany Ferries, PA-C 09/16/2016 in hospital  Chief Complaint  Patient presents with  . Dizziness  . Shortness of Breath    History of Present Illness: Tiffany Charles is a 83 y.o. female with a history of  D-CHF, HTN, HLD, perm Afib on Xarelto, COPD, CAD w/ stent RCA 2009 and med rx for residual dz distal CFX 2013 on Ranexa, tob use, poor med compliance (partly financial)  11/28 phone notes describing patient feeling dizzy and tired, appointment made  Tiffany Charles presents for cardiology follow up.   She does not know the name of her medications, she thinks she takes about 4 pills per day. (staff contacted pharmacies: [facility]- no rx since 08/2016, [facility]- no rx since 09/2016, [facility]- no rx since 2014)  She has not had any chest pain.  She has not had any LE edema, no orthopnea or PND. She gets SOB when she walks, but can walk 100-200 feet without stopping. She does not get SOB when she goes to the grocery store. She has some outbuildings on the property, if she goes down and back without stopping or tries to walk fast, she gets SOB. She vacuums without any problem.   No palpitations, never feels her heart skip or race. Never gets light-headed. Sometimes when she is walking fast, she will get dizzy.   Pt has had some fatigue lately. She feels she gets tired more quickly now than she used to. She cannot keep going like she used to. She has to rest more often. She will get tired fairly quickly doing things outside. Inside, she takes her time and gets through things.   Her son did not initially come back with her, but was in the waiting room.  We requested he come back and spoke with him privately.  He states that his main problem is that his mother is becoming more and more forgetful.  He is doing most of the  grocery shopping and other things around the house.  Says he will take sure she takes her medicines, and make sure that she gets them filled.  States that she has not been watching the sodium in the food she eats.  She is smoking more now than she has in a long time.   Past Medical History:  Diagnosis Date  . Angina pectoris   . Arthritis   . Chronic anticoagulation    Xarelto  . Chronic diastolic (congestive) heart failure ([place])   . COPD (chronic obstructive pulmonary disease) ([place])   . Coronary artery disease    a. s/p RCA stent 2008. b. cath 02/2009: 30% mLAD, 90% AV groove cx unchanged from prior, 50-60% RCA, EF 60% -> med rx.  . Depression   . Essential hypertension   . Hyperlipidemia   . Mild pulmonary hypertension ([place]) 09/17/2016  . Permanent atrial fibrillation ([place])   . Skin cancer Jan 2013   squamous cell- removed  . Stented coronary artery 2009   RCA stent  . Tricuspid regurgitation 09/17/2016    Past Surgical History:  Procedure Laterality Date  .  CATARACTS REMOVED    . ABDOMINAL HYSTERECTOMY    . CARDIAC CATHETERIZATION  03/22/2009   Started medical therapy  . CARDIOVERSION  06/05/2009   3 attempts, last attempt successful  . CORONARY ANGIOPLASTY WITH  STENT PLACEMENT  2009   RCA Vision stent  . HIP SURGERY    . MELANOMA EXCISION  06/06/2011   Procedure: MELANOMA EXCISION;  Surgeon: Adin Hector, MD;  Location: [facility];  Service: General;  Laterality: Left;  re excision squamous cell lesion left chest wall     Current Outpatient Medications:   PATIENT HAS NOT FILLED ANY RX IN 6 MONTHS   Medication Sig Dispense Refill  . amLODipine (NORVASC) 2.5 MG tablet   2  . carvedilol (COREG) 3.125 MG tablet Take 1 tablet (3.125 mg total) by mouth 2 (two) times daily with a meal. 60 tablet 3  . nitroGLYCERIN (NITROLINGUAL) 0.4 MG/SPRAY spray Place 1 spray under the tongue every 5 (five) minutes as needed for chest pain. 12 g 1  . potassium chloride SA (K-DUR,KLOR-CON)  20 MEQ tablet Take 1 tablet (20 mEq total) by mouth daily. 30 tablet 3  . pravastatin (PRAVACHOL) 40 MG tablet Take 1 tablet (40 mg total) by mouth at bedtime. 30 tablet 11  . ranolazine (RANEXA) 500 MG 12 hr tablet Take 1 tablet (500 mg total) by mouth 2 (two) times daily. 60 tablet 3  . Rivaroxaban (XARELTO) 15 MG TABS tablet Take 1 tablet (15 mg total) by mouth daily before supper. 30 tablet 11   No current facility-administered medications for this visit.     Allergies:   Codeine    Social History:  The patient  reports that she quit smoking about 6 years ago. she has never used smokeless tobacco. She reports that she does not drink alcohol or use drugs.   Family History:  The patient's family history includes Coronary artery disease in her father; Heart disease in her brother, father, and mother; Stroke in her mother, sister, sister, and sister.    ROS:  Please see the history of present illness. All other systems are reviewed and negative.    PHYSICAL EXAM: VS:  BP 136/80   Pulse 79   Ht 5\' 7"  (1.702 m)   Wt 128 lb (58.1 kg)   BMI 20.05 kg/m  , BMI Body mass index is 20.05 kg/m. GEN: Well nourished, well developed, female in no acute distress  HEENT: normal for age  Neck: no JVD, no carotid bruit, no masses Cardiac: Irreg R&R; soft murmur, no rubs, or gallops Respiratory: Few dry rales and decreased breath sounds bases bilaterally, normal work of breathing GI: soft, nontender, nondistended, + BS MS: no deformity or atrophy; trace edema; distal pulses are 2+ in all 4 extremities   Skin: warm and dry, no rash Neuro:  Strength and sensation are intact Psych: euthymic mood, full affect   EKG:  EKG is ordered today. The ekg ordered today demonstrates atrial fibrillation, heart rate 79, T waves are inverted, different from 10/07/2016 ECG  ECHO: 09/17/2016 - Left ventricle: The cavity size was normal. Wall thickness was   increased in a pattern of mild LVH. Indeterminant  diastolic   function (atrial fibrillation). Systolic function was normal. The   estimated ejection fraction was in the range of 55% to 60%. Wall   motion was normal; there were no regional wall motion abnormalities. - Aortic valve: There was no stenosis. - Mitral valve: There was mild regurgitation. - Left atrium: The atrium was severely dilated. - Right ventricle: The cavity size was normal. Systolic function   was mildly reduced. - Right atrium: The atrium was moderately dilated. - Tricuspid valve: There was moderate regurgitation. Peak RV-RA   gradient (  S): 32 mm Hg. - Pulmonary arteries: PA peak pressure: 40 mm Hg (S). - Systemic veins: IVC measured 1.9 cm with < 50% respirophasic   variation, suggesting RA pressure 8 mmHg. Impressions: - The patient was in atrial fibrillation. Normal LV size with mild   LV hypertrophy. EF 55-60%. Normal RV size with mildly decreased   systolic function. Biatrial enlargement. Mild MR, moderate TR.   Mild pulmonary hypertension.  Recent Labs: 09/16/2016: Hemoglobin 14.9; Platelets 162; TSH 1.470 09/17/2016: ALT 10; Magnesium 1.8 10/17/2016: BUN 17; Creatinine, Ser 0.84; Potassium 4.5; Sodium 139    Lipid Panel    Component Value Date/Time   CHOL 160 09/17/2016 0308   TRIG 73 09/17/2016 0308   HDL 47 09/17/2016 0308   CHOLHDL 3.4 09/17/2016 0308   VLDL 15 09/17/2016 0308   LDLCALC 98 09/17/2016 0308     Wt Readings from Last 3 Encounters:  04/23/17 128 lb (58.1 kg)  10/17/16 127 lb 9.6 oz (57.9 kg)  09/17/16 130 lb 4.7 oz (59.1 kg)     Other studies Reviewed: Additional studies/ records that were reviewed today include: Office notes, hospital records and testing.  ASSESSMENT AND PLAN:  1.  CAD: She is not clearly having ischemic symptoms.  Her son helps care for her and he has not heard her complain of chest pain.  We will restart the carvedilol, the Pravachol, and the sublingual nitroglycerin.  Her EF was normal 6 months ago.  Her  memory has gotten very poor.  Her ECG has some changes.  Discuss with Dr. Sallyanne Kuster if ischemic evaluation is needed, or treat symptomatically.  2.  Hyperlipidemia: Restart Pravachol, check cholesterol and liver functions per Dr. Sallyanne Kuster, LDL was 98, 6 months ago  3.  Tobacco use: Encouraged her son to try to get her to stop smoking but it is doubtful this will happen.  4.  Dizziness: The patient does not really complain of this, check a BMET to make sure that she has adequate p.o. intake.  According to her son, she eats and drinks very little.  She was on potassium daily, without a diuretic..  5.  Permanent atrial fibrillation: Her heart rate is fairly well controlled, restart beta-blocker  6.  Chronic anticoagulation: Compliance is poor, her son states that he will pick up the Xarelto make sure she takes it.  She has gotten samples in the past, but got a 2-week supply 6 months ago.  7.  Mental status issues: Her memory is poor and I am concerned that she is developing some dementia.  Her son is extremely concerned about her.  We requested that he follow-up with Dr. Ronnald Ramp as soon as possible.   Current medicines are reviewed at length with the patient today.  The patient does not have concerns regarding medicines.  The following changes have been made: Restart the carvedilol, the Pravachol, the nitro, and the Xarelto  Labs/ tests ordered today include:   Orders Placed This Encounter  Procedures  . Basic metabolic panel  . EKG 12-Lead     Disposition:   FU with Dr. Sallyanne Kuster  Signed, Tiffany Ferries, PA-C  04/23/2017 5:19 PM    [place] Phone: [phone]; Fax: [phone]  This note was written with the assistance of speech recognition software. Please excuse any transcriptional errors.

## 2017-04-23 NOTE — Progress Notes (Signed)
Sorry to hear about her memory problems.  EKG changes are not very dramatic: do not think that we need to pursue an ischemic workup at this point. Thanks  [facility]

## 2017-04-23 NOTE — Patient Instructions (Addendum)
Your physician recommends that you continue on your current medications as directed. Please refer to the Current Medication list given to you today.  Your physician recommends that you return for lab work in:  [place] physician recommends that you schedule a follow-up appointment in:  [place] WITH DR Scarlette Calico

## 2017-04-23 NOTE — Telephone Encounter (Signed)
Pt has appt with Suanne Marker tomorrow at 230pm

## 2017-04-23 NOTE — Telephone Encounter (Signed)
Did we get her scheduled? MCr

## 2017-04-24 LAB — BASIC METABOLIC PANEL
BUN/Creatinine Ratio: 14 (ref 12–28)
BUN: 15 mg/dL (ref 8–27)
CALCIUM: 9.4 mg/dL (ref 8.7–10.3)
CHLORIDE: 102 mmol/L (ref 96–106)
CO2: 25 mmol/L (ref 20–29)
Creatinine, Ser: 1.05 mg/dL — ABNORMAL HIGH (ref 0.57–1.00)
GFR calc Af Amer: 56 mL/min/{1.73_m2} — ABNORMAL LOW (ref >59)
GFR, EST NON AFRICAN AMERICAN: 49 mL/min/{1.73_m2} — AB (ref >59)
GLUCOSE: 89 mg/dL (ref 65–99)
POTASSIUM: 4.9 mmol/L (ref 3.5–5.2)
SODIUM: 140 mmol/L (ref 134–144)

## 2017-04-27 ENCOUNTER — Ambulatory Visit: Payer: Medicare HMO | Admitting: Cardiology

## 2017-04-28 ENCOUNTER — Telehealth: Payer: Self-pay | Admitting: *Deleted

## 2017-04-28 NOTE — Telephone Encounter (Signed)
Medication Samples have been provided to the patient.  Drug name: Xarelto 15mg Qty: 28 LOT: [id number] Exp.Date: 12/20  The patient has been instructed regarding the correct time, dose, and frequency of taking this medication, including desired effects and most common side effects.

## 2017-04-29 ENCOUNTER — Ambulatory Visit: Payer: Medicare HMO | Admitting: Internal Medicine

## 2017-04-29 ENCOUNTER — Encounter: Payer: Self-pay | Admitting: Internal Medicine

## 2017-04-29 VITALS — BP 126/80 | HR 91 | Temp 97.6°F | Ht 67.0 in | Wt 129.0 lb

## 2017-04-29 DIAGNOSIS — I482 Chronic atrial fibrillation: Secondary | ICD-10-CM

## 2017-04-29 DIAGNOSIS — Z23 Encounter for immunization: Secondary | ICD-10-CM

## 2017-04-29 DIAGNOSIS — I10 Essential (primary) hypertension: Secondary | ICD-10-CM | POA: Diagnosis not present

## 2017-04-29 DIAGNOSIS — F0390 Unspecified dementia without behavioral disturbance: Secondary | ICD-10-CM | POA: Diagnosis not present

## 2017-04-29 DIAGNOSIS — I4821 Permanent atrial fibrillation: Secondary | ICD-10-CM

## 2017-04-29 DIAGNOSIS — Z7901 Long term (current) use of anticoagulants: Secondary | ICD-10-CM | POA: Diagnosis not present

## 2017-04-29 NOTE — Progress Notes (Signed)
Subjective:  Patient ID: Tiffany Charles, female    DOB: June 10, 1929  Age: 83 y.o. MRN: [id number]  CC: Atrial Fibrillation   HPI LATORSHA CURLING presents for f/up - Her son is with her today and complains about her failing memory and forgetfulness.  She otherwise feels well and offers no complaints.  She has started smoking again and is not willing to quit smoking.  She has had no recent episodes of cough, shortness of breath, chest pain, palpitations, edema, or fatigue.  Outpatient Medications Prior to Visit  Medication Sig Dispense Refill  . carvedilol (COREG) 3.125 MG tablet Take 1 tablet (3.125 mg total) by mouth 2 (two) times daily with a meal. 60 tablet 11  . pravastatin (PRAVACHOL) 40 MG tablet Take 1 tablet (40 mg total) by mouth at bedtime. 30 tablet 11  . Rivaroxaban (XARELTO) 15 MG TABS tablet Take 1 tablet (15 mg total) by mouth daily before supper. 30 tablet 11   No facility-administered medications prior to visit.     ROS Review of Systems  Constitutional: Negative.  Negative for chills, diaphoresis, fatigue, fever and unexpected weight change.  HENT: Negative.   Respiratory: Negative.  Negative for cough, chest tightness, shortness of breath and wheezing.   Cardiovascular: Negative for chest pain, palpitations and leg swelling.  Gastrointestinal: Negative for abdominal pain, diarrhea, nausea and vomiting.  Endocrine: Negative.   Genitourinary: Negative.  Negative for difficulty urinating.  Musculoskeletal: Negative.  Negative for back pain, myalgias and neck pain.  Skin: Negative.  Negative for color change and rash.  Neurological: Negative.   Hematological: Negative for adenopathy. Does not bruise/bleed easily.  Psychiatric/Behavioral: Positive for confusion and decreased concentration. Negative for agitation, behavioral problems, hallucinations and sleep disturbance. The patient is not nervous/anxious and is not hyperactive.     Objective:  BP 126/80   Pulse 91    Temp 97.6 F (36.4 C) (Oral)   Ht 5\' 7"  (1.702 m)   Wt 129 lb (58.5 kg)   SpO2 98%   BMI 20.20 kg/m   BP Readings from Last 3 Encounters:  04/29/17 126/80  04/23/17 136/80  10/17/16 122/72    Wt Readings from Last 3 Encounters:  04/29/17 129 lb (58.5 kg)  04/23/17 128 lb (58.1 kg)  10/17/16 127 lb 9.6 oz (57.9 kg)    Physical Exam  Constitutional: She is oriented to person, place, and time. No distress.  HENT:  Mouth/Throat: Oropharynx is clear and moist. No oropharyngeal exudate.  Eyes: Conjunctivae are normal. Right eye exhibits no discharge. Left eye exhibits no discharge. No scleral icterus.  Neck: Normal range of motion. Neck supple. No JVD present. No thyromegaly present.  Cardiovascular: Normal rate. An irregularly irregular rhythm present. Exam reveals no gallop.  No murmur heard. Pulmonary/Chest: Effort normal and breath sounds normal. No respiratory distress. She has no wheezes. She has no rales.  Abdominal: Soft. Bowel sounds are normal. She exhibits no mass. There is no tenderness. There is no guarding.  Musculoskeletal: Normal range of motion. She exhibits no edema, tenderness or deformity.  Lymphadenopathy:    She has no cervical adenopathy.  Neurological: She is alert and oriented to person, place, and time.  Skin: Skin is warm and dry. No rash noted. She is not diaphoretic. No erythema. No pallor.    Lab Results  Component Value Date   WBC 8.2 09/16/2016   HGB 14.9 09/16/2016   HCT 42.9 09/16/2016   PLT 162 09/16/2016   GLUCOSE 89 04/23/2017  CHOL 160 09/17/2016   TRIG 73 09/17/2016   HDL 47 09/17/2016   LDLCALC 98 09/17/2016   ALT 10 (L) 09/17/2016   AST 27 09/17/2016   NA 140 04/23/2017   K 4.9 04/23/2017   CL 102 04/23/2017   CREATININE 1.05 (H) 04/23/2017   BUN 15 04/23/2017   CO2 25 04/23/2017   TSH 1.470 09/16/2016   INR 1.54 09/16/2016    Dg Chest 2 View  Result Date: 09/16/2016 CLINICAL DATA:  Abnormal EKG EXAM: CHEST  2 VIEW  COMPARISON:  08/09/2015 FINDINGS: Borderline cardiomegaly, stable. Stable mild aortic tortuosity. Negative hila. Chronic interstitial coarsening without Kerley lines or air bronchogram. No effusion or pneumothorax. IMPRESSION: Stable compared to prior.  No evidence of active disease. Electronically Signed   By: Monte Fantasia M.D.   On: 09/16/2016 13:36    Assessment & Plan:   Maleena was seen today for atrial fibrillation.  Diagnoses and all orders for this visit:  Senile dementia without behavioral disturbance- This is age-related and there is no significant treatment option.  I encouraged her son to keep her engaged and active.  She does not have behavioral disturbance so will not start citalopram.  Chronic anticoagulation- Xarelto  Essential hypertension- Her blood pressure is adequately well controlled.  Permanent atrial fibrillation ([place])- She has good rate control and is asymptomatic.  Will continue anticoagulation.  Other orders -     Pneumococcal polysaccharide vaccine 23-valent greater than or equal to 2yo subcutaneous/IM   I am having Tiffany Charles maintain her Rivaroxaban, pravastatin, and carvedilol.  No orders of the defined types were placed in this encounter.    Follow-up: Return if symptoms worsen or fail to improve.  Scarlette Calico, MD

## 2017-04-29 NOTE — Patient Instructions (Signed)
Dementia Dementia is the loss of two or more brain functions, such as:  Memory.  Decision making.  Behavior.  Speaking.  Thinking.  Problem solving.  There are many types of dementia. The most common type is called progressive dementia. Progressive dementia gets worse with time and it is irreversible. An example of this type of dementia is Alzheimer disease. What are the causes? This condition may be caused by:  Nerve cell damage in the brain.  Genetic mutations.  Certain medicines.  Multiple small strokes.  An infection, such as chronic meningitis.  A metabolic problem, such as vitamin B12 deficiency or thyroid disease.  Pressure on the brain, such as from a tumor or blood clot.  What are the signs or symptoms? Symptoms of this condition include:  Sudden changes in mood.  Depression.  Problems with balance.  Changes in personality.  Poor short-term memory.  Agitation.  Delusions.  Hallucinations.  Having a hard time: ? Speaking thoughts. ? Finding words. ? Solving problems. ? Doing familiar tasks. ? Understanding familiar ideas.  How is this diagnosed? This condition is diagnosed with an assessment by your health care provider. During this assessment, your health care provider will talk with you and your family, friends, or caregivers about your symptoms. A thorough medical history will be taken, and you will have a physical exam and tests. Tests may include:  Lab tests, such as blood or urine tests.  Imaging tests, such as a CT scan, PET scan, or MRI.  A lumbar puncture. This test involves removing and testing a small amount of the fluid that surrounds the brain and spinal cord.  An electroencephalogram (EEG). In this test, small metal discs are used to measure electrical activity in the brain.  Memory tests, cognitive tests, and neuropsychological tests. These tests evaluate brain function.  How is this treated? Treatment depends on the  cause of the dementia. It may involve taking medicines that may help:  To control the dementia.  To slow down the disease.  To manage symptoms.  In some cases, treating the cause of the dementia can improve symptoms, reverse symptoms, or slow down how quickly the dementia gets worse. Your health care provider can help direct you to support groups, organizations, and other health care providers who can help with decisions about your care. Follow these instructions at home: Medicine  Take over-the-counter and prescription medicines only as told by your health care provider.  Avoid taking medicines that can affect thinking, such as pain or sleeping medicines. Lifestyle   Make healthy lifestyle choices: ? Be physically active as told by your health care provider. ? Do not use any tobacco products, such as cigarettes, chewing tobacco, and e-cigarettes. If you need help quitting, ask your health care provider. ? Eat a healthy diet. ? Practice stress-management techniques when you get stressed. ? Stay social.  Drink enough fluid to keep your urine clear or pale yellow.  Make sure to get quality sleep. These tips can help you to get a good night's rest: ? Avoid napping during the day. ? Keep your sleeping area dark and cool. ? Avoid exercising during the few hours before you go to bed. ? Avoid caffeine products in the evening. General instructions  Work with your health care provider to determine what you need help with and what your safety needs are.  If you were given a bracelet that tracks your location, make sure to wear it.  Keep all follow-up visits as told by your   health care provider. This is important. Contact a health care provider if:  You have any new symptoms.  You have problems with choking or swallowing.  You have any symptoms of a different illness. Get help right away if:  You develop a fever.  You have new or worsening confusion.  You have new or  worsening sleepiness.  You have a hard time staying awake.  You or your family members become concerned for your safety. This information is not intended to replace advice given to you by your health care provider. Make sure you discuss any questions you have with your health care provider. Document Released: 11/05/2000 Document Revised: 09/20/2015 Document Reviewed: 02/07/2015 Elsevier Interactive Patient Education  2017 [employer]  

## 2017-05-14 DIAGNOSIS — D0359 Melanoma in situ of other part of trunk: Secondary | ICD-10-CM | POA: Diagnosis not present

## 2017-05-14 DIAGNOSIS — D0461 Carcinoma in situ of skin of right upper limb, including shoulder: Secondary | ICD-10-CM | POA: Diagnosis not present

## 2017-05-14 DIAGNOSIS — L821 Other seborrheic keratosis: Secondary | ICD-10-CM | POA: Diagnosis not present

## 2017-05-14 DIAGNOSIS — L814 Other melanin hyperpigmentation: Secondary | ICD-10-CM | POA: Diagnosis not present

## 2017-05-14 DIAGNOSIS — L570 Actinic keratosis: Secondary | ICD-10-CM | POA: Diagnosis not present

## 2017-05-14 DIAGNOSIS — Z85820 Personal history of malignant melanoma of skin: Secondary | ICD-10-CM | POA: Diagnosis not present

## 2017-05-14 DIAGNOSIS — Z85828 Personal history of other malignant neoplasm of skin: Secondary | ICD-10-CM | POA: Diagnosis not present

## 2017-07-30 ENCOUNTER — Ambulatory Visit: Payer: Medicare HMO | Admitting: Cardiovascular Disease

## 2017-07-30 ENCOUNTER — Encounter: Payer: Self-pay | Admitting: Cardiovascular Disease

## 2017-07-30 VITALS — BP 140/81 | HR 80 | Ht 67.0 in | Wt 126.8 lb

## 2017-07-30 DIAGNOSIS — Z79899 Other long term (current) drug therapy: Secondary | ICD-10-CM | POA: Diagnosis not present

## 2017-07-30 DIAGNOSIS — J449 Chronic obstructive pulmonary disease, unspecified: Secondary | ICD-10-CM | POA: Diagnosis not present

## 2017-07-30 DIAGNOSIS — I4821 Permanent atrial fibrillation: Secondary | ICD-10-CM

## 2017-07-30 DIAGNOSIS — I2510 Atherosclerotic heart disease of native coronary artery without angina pectoris: Secondary | ICD-10-CM

## 2017-07-30 DIAGNOSIS — E785 Hyperlipidemia, unspecified: Secondary | ICD-10-CM | POA: Diagnosis not present

## 2017-07-30 DIAGNOSIS — Z7901 Long term (current) use of anticoagulants: Secondary | ICD-10-CM | POA: Diagnosis not present

## 2017-07-30 DIAGNOSIS — I482 Chronic atrial fibrillation: Secondary | ICD-10-CM

## 2017-07-30 MED ORDER — METOPROLOL SUCCINATE ER 25 MG PO TB24: 25.0000 mg | ORAL_TABLET | Freq: Every day | ORAL | 3 refills | Status: DC

## 2017-07-30 NOTE — Patient Instructions (Signed)
Dr Sallyanne Kuster has recommended making the following medication changes: 1. STOP Carvedilol 2. START Metoprolol Succinate 25 mg - take 1 tablet by mouth daily  Your physician recommends that you return for lab work at your [facility].  Dr Sallyanne Kuster recommends that you schedule a follow-up appointment in 12 months. You will receive a reminder letter in the mail two months in advance. If you don't receive a letter, please call our office to schedule the follow-up appointment.  If you need a refill on your cardiac medications before your next appointment, please call your pharmacy.

## 2017-07-30 NOTE — Progress Notes (Signed)
Patient ID: Tiffany Charles, female   DOB: 10-24-1929, 84 y.o.   MRN: [id number]     Cardiology Office Note   Date:  08/01/2017   ID:  Tiffany Charles, DOB 02/06/1930, MRN [id number]  PCP:  [name], MD  Cardiologist:   Sanda Klein, MD   Chief Complaint  Patient presents with  . Follow-up    3 months;  AFib, CAD    History of Present Illness: Tiffany Charles is a 84 y.o. female who presents for Atrial fibrillation, coronary artery disease Follow up  Tiffany Charles has known multivessel coronary artery disease s/p stent to distal right coronary artery, with high-grade stenosis in the distal left circumflex coronary artery not amenable to percutaneous revascularization. A nuclear stress test performed in February of 2013 did not show any evidence of meaningful ischemia. She has preserved left ventricular systolic function.  She has a history of almost 60 years of smoking a pack a day. She has been in permanent atrial fibrillation since 2010. She has a history of marked bradycardia when she was treated with beta blockers in combination with diltiazem. She has been tolerating anticoagulation with Xarelto without bleeding complications and does not have a history of stroke or TIA.  Tiffany Charles is feeling well and has no complaints today.  She denies any issues with bleeding, falls, injuries, palpitations, syncope, dizziness or lightheadedness, angina or dyspnea either at rest or with household activity.  She only becomes short of breath if she has to walk longer distances faster than usual pace.  She saw Rosaria Ferries in November 2018 with complaints of fatigue.  There were issues with her memory and her compliance with medications.  Today Tiffany Charles tells me that she often forgets to take her morning dose of medications.  She takes her pravastatin and her Xarelto with dinner and she remembers to take the carvedilol in the evenings.  Past Medical History:  Diagnosis Date  . Angina pectoris   .  Arthritis   . Chronic anticoagulation    Xarelto  . Chronic diastolic (congestive) heart failure ([place])   . COPD (chronic obstructive pulmonary disease) ([place])   . Coronary artery disease    a. s/p RCA stent 2008. b. cath 02/2009: 30% mLAD, 90% AV groove cx unchanged from prior, 50-60% RCA, EF 60% -> med rx.  . Depression   . Essential hypertension   . Hyperlipidemia   . Mild pulmonary hypertension ([place]) 09/17/2016  . Permanent atrial fibrillation ([place])   . Skin cancer Jan 2013   squamous cell- removed  . Stented coronary artery 2009   RCA stent  . Tricuspid regurgitation 09/17/2016    Past Surgical History:  Procedure Laterality Date  .  CATARACTS REMOVED    . ABDOMINAL HYSTERECTOMY    . CARDIAC CATHETERIZATION  03/22/2009   Started medical therapy  . CARDIOVERSION  06/05/2009   3 attempts, last attempt successful  . CORONARY ANGIOPLASTY WITH STENT PLACEMENT  2009   RCA Vision stent  . HIP SURGERY    . MELANOMA EXCISION  06/06/2011   Procedure: MELANOMA EXCISION;  Surgeon: Adin Hector, MD;  Location: [facility];  Service: General;  Laterality: Left;  re excision squamous cell lesion left chest wall      Current Outpatient Medications  Medication Sig Dispense Refill  . pravastatin (PRAVACHOL) 40 MG tablet Take 1 tablet (40 mg total) by mouth at bedtime. 30 tablet 11  . Rivaroxaban (XARELTO) 15 MG TABS tablet Take 1 tablet (  15 mg total) by mouth daily before supper. 30 tablet 11  . metoprolol succinate (TOPROL-XL) 25 MG 24 hr tablet Take 1 tablet (25 mg total) by mouth daily. 90 tablet 3   No current facility-administered medications for this visit.     Allergies:   Codeine    Social History:  The patient  reports that she quit smoking about 7 years ago. she has never used smokeless tobacco. She reports that she does not drink alcohol or use drugs.   Family History:  The patient's family history includes Coronary artery disease in her father; Heart disease in her brother,  father, and mother; Stroke in her mother, sister, sister, and sister.    ROS:  Please see the history of present illness.    Otherwise, review of systems positive for none.   All other systems are reviewed and negative.    PHYSICAL EXAM: VS:  BP 140/81   Pulse 80   Ht 5\' 7"  (1.702 m)   Wt 126 lb 12.8 oz (57.5 kg)   BMI 19.86 kg/m  , BMI Body mass index is 19.86 kg/m.   General: Alert, oriented x3, no distress, appears lean but not frail Head: no evidence of trauma, PERRL, EOMI, no exophtalmos or lid lag, no myxedema, no xanthelasma; normal ears, nose and oropharynx Neck: normal jugular venous pulsations and no hepatojugular reflux; brisk carotid pulses without delay and no carotid bruits Chest: clear to auscultation, no signs of consolidation by percussion or palpation, normal fremitus, symmetrical and full respiratory excursions Cardiovascular: normal position and quality of the apical impulse, irregular rhythm, normal first and second heart sounds, no murmurs, rubs or gallops Abdomen: no tenderness or distention, no masses by palpation, no abnormal pulsatility or arterial bruits, normal bowel sounds, no hepatosplenomegaly Extremities: no clubbing, cyanosis or edema; 2+ radial, ulnar and brachial pulses bilaterally; 2+ right femoral, posterior tibial and dorsalis pedis pulses; 2+ left femoral, posterior tibial and dorsalis pedis pulses; no subclavian or femoral bruits Neurological: grossly nonfocal Psych: Normal mood and affect    EKG:  EKG is not ordered today.  Recent Labs: 09/16/2016: Hemoglobin 14.9; Platelets 162; TSH 1.470 09/17/2016: Magnesium 1.8 07/30/2017: ALT 10; BUN 7; Creatinine, Ser 0.95; Potassium 4.3; Sodium 139    Lipid Panel    Component Value Date/Time   CHOL 170 07/30/2017 1102   TRIG 57 07/30/2017 1102   HDL 60 07/30/2017 1102   CHOLHDL 2.8 07/30/2017 1102   CHOLHDL 3.4 09/17/2016 0308   VLDL 15 09/17/2016 0308   LDLCALC 99 07/30/2017 1102      Wt  Readings from Last 3 Encounters:  07/30/17 126 lb 12.8 oz (57.5 kg)  04/29/17 129 lb (58.5 kg)  04/23/17 128 lb (58.1 kg)      ASSESSMENT AND PLAN:  1. Permanent atrial fibrillation ([place])   2. Coronary artery disease involving native coronary artery of native heart without angina pectoris   3. Dyslipidemia   4. Chronic obstructive pulmonary disease, unspecified COPD type ([place])   5. Long term (current) use of anticoagulants   6. Medication management     1. AFib: Permanent arrhythmia, well rate controlled on a low dose of carvedilol, on appropriate anticoagulation.  We will switch to once daily metoprolol succinate for better compliance.  CHADSVasc at least 4 (age 25, gender, CAD). Continue Xarelto. 2. CAD: Does not have angina (even though she is no longer taking Ranexa) and has not required revascularization since 2010, despite a known stenosis in the AV groove  portion of the left circumflex coronary artery. 3. HLP: Rechecked today. Although LDL cholesterol is not at target, the range is acceptable she has developing dementia.  Will avoid more aggressive lipid-lowering therapy.  She has not had evidence of progression of vascular disease in the last almost 10 years. 4. COPD: This probably explains her mild shortness of breath, functional class II.  She quit smoking for a while a couple of years ago, but has started smoking again.  Current medicines are reviewed at length with the patient today.  The patient does not have concerns regarding medicines.  Labs/ tests ordered today include:   Orders Placed This Encounter  Procedures  . Comprehensive metabolic panel  . Lipid panel    Patient Instructions  Dr Sallyanne Kuster has recommended making the following medication changes: 1. STOP Carvedilol 2. START Metoprolol Succinate 25 mg - take 1 tablet by mouth daily  Your physician recommends that you return for lab work at your [facility].  Dr Sallyanne Kuster recommends that you schedule  a follow-up appointment in 12 months. You will receive a reminder letter in the mail two months in advance. If you don't receive a letter, please call our office to schedule the follow-up appointment.  If you need a refill on your cardiac medications before your next appointment, please call your pharmacy.     Signed, Sanda Klein, MD  08/01/2017 12:10 PM    Sanda Klein, MD, [facility] [phone] office [phone] pager

## 2017-07-31 LAB — COMPREHENSIVE METABOLIC PANEL
ALK PHOS: 67 IU/L (ref 39–117)
ALT: 10 IU/L (ref 0–32)
AST: 31 IU/L (ref 0–40)
Albumin/Globulin Ratio: 2 (ref 1.2–2.2)
Albumin: 4.3 g/dL (ref 3.5–4.7)
BUN/Creatinine Ratio: 7 — ABNORMAL LOW (ref 12–28)
BUN: 7 mg/dL — AB (ref 8–27)
Bilirubin Total: 0.4 mg/dL (ref 0.0–1.2)
CO2: 27 mmol/L (ref 20–29)
CREATININE: 0.95 mg/dL (ref 0.57–1.00)
Calcium: 9.3 mg/dL (ref 8.7–10.3)
Chloride: 101 mmol/L (ref 96–106)
GFR calc Af Amer: 63 mL/min/{1.73_m2} (ref >59)
GFR calc non Af Amer: 55 mL/min/{1.73_m2} — ABNORMAL LOW (ref >59)
GLUCOSE: 78 mg/dL (ref 65–99)
Globulin, Total: 2.2 g/dL (ref 1.5–4.5)
Potassium: 4.3 mmol/L (ref 3.5–5.2)
SODIUM: 139 mmol/L (ref 134–144)
Total Protein: 6.5 g/dL (ref 6.0–8.5)

## 2017-07-31 LAB — LIPID PANEL
CHOLESTEROL TOTAL: 170 mg/dL (ref 100–199)
Chol/HDL Ratio: 2.8 ratio (ref 0.0–4.4)
HDL: 60 mg/dL (ref >39)
LDL CALC: 99 mg/dL (ref 0–99)
TRIGLYCERIDES: 57 mg/dL (ref 0–149)
VLDL CHOLESTEROL CAL: 11 mg/dL (ref 5–40)

## 2017-08-01 ENCOUNTER — Encounter: Payer: Self-pay | Admitting: Cardiovascular Disease

## 2017-08-11 DIAGNOSIS — D0359 Melanoma in situ of other part of trunk: Secondary | ICD-10-CM | POA: Diagnosis not present

## 2017-08-11 DIAGNOSIS — Z85828 Personal history of other malignant neoplasm of skin: Secondary | ICD-10-CM | POA: Diagnosis not present

## 2017-10-27 ENCOUNTER — Other Ambulatory Visit: Payer: Self-pay | Admitting: Cardiovascular Disease

## 2017-10-27 MED ORDER — PRAVASTATIN SODIUM 40 MG PO TABS: 40.0000 mg | ORAL_TABLET | Freq: Every day | ORAL | 2 refills | Status: DC

## 2017-10-27 MED ORDER — METOPROLOL SUCCINATE ER 25 MG PO TB24: 25.0000 mg | ORAL_TABLET | Freq: Every day | ORAL | 2 refills | Status: DC

## 2017-10-27 MED ORDER — RIVAROXABAN 15 MG PO TABS: 15.0000 mg | ORAL_TABLET | Freq: Every day | ORAL | 2 refills | Status: DC

## 2017-10-27 NOTE — Telephone Encounter (Signed)
New message     *STAT* If patient is at the pharmacy, call can be transferred to refill team.   1. Which medications need to be refilled? (please list name of each medication and dose if known)  metoprolol succinate (TOPROL-XL) 25 MG 24 hr tablet Take 1 tablet (25 mg total) by mouth daily.       2. Which pharmacy/location (including street and city if local pharmacy) is medication to be sent to? [facility] on [address]   3. Do they need a 30 day or 90 day supply? [place]

## 2017-10-27 NOTE — Telephone Encounter (Signed)
New message     *STAT* If patient is at the pharmacy, call can be transferred to refill team.   1. Which medications need to be refilled? (please list name of each medication and dose if known)  pravastatin (PRAVACHOL) 40 MG tablet Take 1 tablet (40 mg total) by mouth at bedtime.    Rivaroxaban (XARELTO) 15 MG TABS tablet Take 1 tablet (15 mg total) by mouth daily before supper.        2. Which pharmacy/location (including street and city if local pharmacy) is medication to be sent to? [address]   3. Do they need a 30 day or 90 day supply?  90 day

## 2017-10-27 NOTE — Telephone Encounter (Signed)
Follow up    Son would like you to call in 90 day prescription with refills so they do not have to call every month

## 2017-10-27 NOTE — Telephone Encounter (Signed)
Rx(s) sent to pharmacy electronically.  

## 2017-12-08 DIAGNOSIS — C44712 Basal cell carcinoma of skin of right lower limb, including hip: Secondary | ICD-10-CM | POA: Diagnosis not present

## 2017-12-08 DIAGNOSIS — Z85828 Personal history of other malignant neoplasm of skin: Secondary | ICD-10-CM | POA: Diagnosis not present

## 2017-12-08 DIAGNOSIS — C44729 Squamous cell carcinoma of skin of left lower limb, including hip: Secondary | ICD-10-CM | POA: Diagnosis not present

## 2017-12-08 DIAGNOSIS — C44722 Squamous cell carcinoma of skin of right lower limb, including hip: Secondary | ICD-10-CM | POA: Diagnosis not present

## 2017-12-08 DIAGNOSIS — L570 Actinic keratosis: Secondary | ICD-10-CM | POA: Diagnosis not present

## 2017-12-08 DIAGNOSIS — Z85820 Personal history of malignant melanoma of skin: Secondary | ICD-10-CM | POA: Diagnosis not present

## 2017-12-23 DIAGNOSIS — Z85828 Personal history of other malignant neoplasm of skin: Secondary | ICD-10-CM | POA: Diagnosis not present

## 2017-12-23 DIAGNOSIS — C44722 Squamous cell carcinoma of skin of right lower limb, including hip: Secondary | ICD-10-CM | POA: Diagnosis not present

## 2018-01-21 DIAGNOSIS — Z85828 Personal history of other malignant neoplasm of skin: Secondary | ICD-10-CM | POA: Diagnosis not present

## 2018-01-21 DIAGNOSIS — C44729 Squamous cell carcinoma of skin of left lower limb, including hip: Secondary | ICD-10-CM | POA: Diagnosis not present

## 2018-02-04 DIAGNOSIS — C44722 Squamous cell carcinoma of skin of right lower limb, including hip: Secondary | ICD-10-CM | POA: Diagnosis not present

## 2018-02-04 DIAGNOSIS — Z85828 Personal history of other malignant neoplasm of skin: Secondary | ICD-10-CM | POA: Diagnosis not present

## 2018-02-18 DIAGNOSIS — Z85828 Personal history of other malignant neoplasm of skin: Secondary | ICD-10-CM | POA: Diagnosis not present

## 2018-02-18 DIAGNOSIS — C44722 Squamous cell carcinoma of skin of right lower limb, including hip: Secondary | ICD-10-CM | POA: Diagnosis not present

## 2018-06-02 DIAGNOSIS — L820 Inflamed seborrheic keratosis: Secondary | ICD-10-CM | POA: Diagnosis not present

## 2018-06-02 DIAGNOSIS — D485 Neoplasm of uncertain behavior of skin: Secondary | ICD-10-CM | POA: Diagnosis not present

## 2018-06-02 DIAGNOSIS — C44729 Squamous cell carcinoma of skin of left lower limb, including hip: Secondary | ICD-10-CM | POA: Diagnosis not present

## 2018-06-02 DIAGNOSIS — Z85820 Personal history of malignant melanoma of skin: Secondary | ICD-10-CM | POA: Diagnosis not present

## 2018-06-02 DIAGNOSIS — Z85828 Personal history of other malignant neoplasm of skin: Secondary | ICD-10-CM | POA: Diagnosis not present

## 2018-06-02 DIAGNOSIS — L821 Other seborrheic keratosis: Secondary | ICD-10-CM | POA: Diagnosis not present

## 2018-06-02 DIAGNOSIS — C44722 Squamous cell carcinoma of skin of right lower limb, including hip: Secondary | ICD-10-CM | POA: Diagnosis not present

## 2018-06-08 ENCOUNTER — Encounter: Payer: Self-pay | Admitting: Internal Medicine

## 2018-06-08 ENCOUNTER — Other Ambulatory Visit (INDEPENDENT_AMBULATORY_CARE_PROVIDER_SITE_OTHER): Payer: Medicare HMO

## 2018-06-08 ENCOUNTER — Ambulatory Visit (INDEPENDENT_AMBULATORY_CARE_PROVIDER_SITE_OTHER): Payer: Medicare HMO | Admitting: Internal Medicine

## 2018-06-08 VITALS — BP 144/80 | HR 81 | Temp 98.1°F | Resp 16 | Ht 67.0 in | Wt 122.5 lb

## 2018-06-08 DIAGNOSIS — I4821 Permanent atrial fibrillation: Secondary | ICD-10-CM | POA: Diagnosis not present

## 2018-06-08 DIAGNOSIS — I10 Essential (primary) hypertension: Secondary | ICD-10-CM | POA: Diagnosis not present

## 2018-06-08 DIAGNOSIS — E7800 Pure hypercholesterolemia, unspecified: Secondary | ICD-10-CM

## 2018-06-08 DIAGNOSIS — C44311 Basal cell carcinoma of skin of nose: Secondary | ICD-10-CM | POA: Diagnosis not present

## 2018-06-08 DIAGNOSIS — I2510 Atherosclerotic heart disease of native coronary artery without angina pectoris: Secondary | ICD-10-CM | POA: Diagnosis not present

## 2018-06-08 DIAGNOSIS — C44721 Squamous cell carcinoma of skin of unspecified lower limb, including hip: Secondary | ICD-10-CM | POA: Diagnosis not present

## 2018-06-08 DIAGNOSIS — Z23 Encounter for immunization: Secondary | ICD-10-CM

## 2018-06-08 LAB — CBC WITH DIFFERENTIAL/PLATELET
Basophils Absolute: 0.1 10*3/uL (ref 0.0–0.1)
Basophils Relative: 0.9 % (ref 0.0–3.0)
Eosinophils Absolute: 0.1 10*3/uL (ref 0.0–0.7)
Eosinophils Relative: 1.6 % (ref 0.0–5.0)
HCT: 42.9 % (ref 36.0–46.0)
HEMOGLOBIN: 14.5 g/dL (ref 12.0–15.0)
Lymphocytes Relative: 36.9 % (ref 12.0–46.0)
Lymphs Abs: 2.6 10*3/uL (ref 0.7–4.0)
MCHC: 33.9 g/dL (ref 30.0–36.0)
MCV: 96.1 fl (ref 78.0–100.0)
MONOS PCT: 7.5 % (ref 3.0–12.0)
Monocytes Absolute: 0.5 10*3/uL (ref 0.1–1.0)
Neutro Abs: 3.8 10*3/uL (ref 1.4–7.7)
Neutrophils Relative %: 53.1 % (ref 43.0–77.0)
Platelets: 190 10*3/uL (ref 150.0–400.0)
RBC: 4.46 Mil/uL (ref 3.87–5.11)
RDW: 13.7 % (ref 11.5–15.5)
WBC: 7.1 10*3/uL (ref 4.0–10.5)

## 2018-06-08 LAB — COMPREHENSIVE METABOLIC PANEL
ALBUMIN: 4.1 g/dL (ref 3.5–5.2)
ALT: 6 U/L (ref 0–35)
AST: 23 U/L (ref 0–37)
Alkaline Phosphatase: 64 U/L (ref 39–117)
BUN: 13 mg/dL (ref 6–23)
CO2: 28 mEq/L (ref 19–32)
Calcium: 9.2 mg/dL (ref 8.4–10.5)
Chloride: 103 mEq/L (ref 96–112)
Creatinine, Ser: 0.94 mg/dL (ref 0.40–1.20)
GFR: 59.99 mL/min — ABNORMAL LOW (ref >60.00)
Glucose, Bld: 97 mg/dL (ref 70–99)
Potassium: 3.7 mEq/L (ref 3.5–5.1)
Sodium: 138 mEq/L (ref 135–145)
Total Bilirubin: 0.5 mg/dL (ref 0.2–1.2)
Total Protein: 6.8 g/dL (ref 6.0–8.3)

## 2018-06-08 LAB — LIPID PANEL
CHOLESTEROL: 194 mg/dL (ref 0–200)
HDL: 52.1 mg/dL (ref >39.00)
LDL Cholesterol: 123 mg/dL — ABNORMAL HIGH (ref 0–99)
NonHDL: 141.95
Total CHOL/HDL Ratio: 4
Triglycerides: 95 mg/dL (ref 0.0–149.0)
VLDL: 19 mg/dL (ref 0.0–40.0)

## 2018-06-08 LAB — TSH: TSH: 1.5 u[IU]/mL (ref 0.35–4.50)

## 2018-06-08 NOTE — Progress Notes (Signed)
Subjective:  Patient ID: Tiffany Charles, female    DOB: December 09, 1929  Age: 85 y.o. MRN: [id number]  CC: Hypertension; Atrial Fibrillation; and Hyperlipidemia  She is with her son today.  HPI CHAMIKA CUNANAN presents for f/up - Due to her dementia there is not much history to obtain here but her son tells me she is pretty miserable with her lower extremities.  She has been seen by dermatology and has multiple lesions of squamous cell carcinoma on her anterior lower legs.  This is being treated by dermatology.  He otherwise thinks she is doing well and has not noticed any recent complaints.  Outpatient Medications Prior to Visit  Medication Sig Dispense Refill  . metoprolol succinate (TOPROL-XL) 25 MG 24 hr tablet Take 1 tablet (25 mg total) by mouth daily. 90 tablet 2  . pravastatin (PRAVACHOL) 40 MG tablet Take 1 tablet (40 mg total) by mouth at bedtime. 90 tablet 2  . Rivaroxaban (XARELTO) 15 MG TABS tablet Take 1 tablet (15 mg total) by mouth daily before supper. 90 tablet 2   No facility-administered medications prior to visit.     ROS Review of Systems  Constitutional: Negative for appetite change, diaphoresis and fatigue.  HENT: Negative.  Negative for trouble swallowing.   Eyes: Negative for visual disturbance.  Respiratory: Negative for cough, chest tightness, shortness of breath and wheezing.   Cardiovascular: Negative for chest pain, palpitations and leg swelling.  Gastrointestinal: Negative for abdominal pain, constipation, diarrhea and nausea.  Endocrine: Negative.   Genitourinary: Negative.  Negative for decreased urine volume, difficulty urinating, hematuria and urgency.  Musculoskeletal: Negative.   Skin: Positive for wound.  Neurological: Negative.  Negative for dizziness, weakness and light-headedness.  Hematological: Negative for adenopathy. Does not bruise/bleed easily.  Psychiatric/Behavioral: Negative.     Objective:  BP (!) 144/80 (BP Location: Left Arm,  Patient Position: Sitting, Cuff Size: Normal)   Pulse 81   Temp 98.1 F (36.7 C) (Oral)   Resp 16   Ht 5\' 7"  (1.702 m)   Wt 122 lb 8 oz (55.6 kg)   SpO2 96%   BMI 19.19 kg/m   BP Readings from Last 3 Encounters:  06/08/18 (!) 144/80  07/30/17 140/81  04/29/17 126/80    Wt Readings from Last 3 Encounters:  06/08/18 122 lb 8 oz (55.6 kg)  07/30/17 126 lb 12.8 oz (57.5 kg)  04/29/17 129 lb (58.5 kg)    Physical Exam Constitutional:      Appearance: Normal appearance.  HENT:     Nose: Nose normal. No congestion or rhinorrhea.     Mouth/Throat:     Mouth: Mucous membranes are moist.     Pharynx: No oropharyngeal exudate or posterior oropharyngeal erythema.  Eyes:     General: No scleral icterus.    Conjunctiva/sclera: Conjunctivae normal.  Neck:     Musculoskeletal: Normal range of motion and neck supple.  Cardiovascular:     Rate and Rhythm: Normal rate. Rhythm irregularly irregular.     Heart sounds: No murmur. No gallop.   Pulmonary:     Effort: Pulmonary effort is normal.     Breath sounds: No stridor. No wheezing, rhonchi or rales.  Abdominal:     General: Bowel sounds are normal.     Palpations: There is no mass.     Tenderness: There is no abdominal tenderness. There is no guarding.     Hernia: No hernia is present.  Skin:    General: Skin is  warm and dry.     Findings: No erythema.     Comments: Over both lower extremities, anteriorly, there are multiple clusters of dome-shaped lesions with central ulceration and some scab formation.  There is no exudate, erythema, streaking, or tenderness.  Neurological:     General: No focal deficit present.     Mental Status: Mental status is at baseline. She is disoriented.  Psychiatric:        Mood and Affect: Mood normal.        Behavior: Behavior normal. Behavior is cooperative.     Lab Results  Component Value Date   WBC 7.1 06/08/2018   HGB 14.5 06/08/2018   HCT 42.9 06/08/2018   PLT 190.0 06/08/2018    GLUCOSE 97 06/08/2018   CHOL 194 06/08/2018   TRIG 95.0 06/08/2018   HDL 52.10 06/08/2018   LDLCALC 123 (H) 06/08/2018   ALT 6 06/08/2018   AST 23 06/08/2018   NA 138 06/08/2018   K 3.7 06/08/2018   CL 103 06/08/2018   CREATININE 0.94 06/08/2018   BUN 13 06/08/2018   CO2 28 06/08/2018   TSH 1.50 06/08/2018   INR 1.54 09/16/2016    Dg Chest 2 View  Result Date: 09/16/2016 CLINICAL DATA:  Abnormal EKG EXAM: CHEST  2 VIEW COMPARISON:  08/09/2015 FINDINGS: Borderline cardiomegaly, stable. Stable mild aortic tortuosity. Negative hila. Chronic interstitial coarsening without Kerley lines or air bronchogram. No effusion or pneumothorax. IMPRESSION: Stable compared to prior.  No evidence of active disease. Electronically Signed   By: Monte Fantasia M.D.   On: 09/16/2016 13:36    Assessment & Plan:   Zanobia was seen today for hypertension, atrial fibrillation and hyperlipidemia.  Diagnoses and all orders for this visit:  Squamous cell carcinoma, leg, unspecified laterality- She will continue treatment with dermatology.  Basal cell carcinoma (BCC) of nasal tip- As above  Permanent atrial fibrillation-she is maintaining rate control with the beta-blocker.  We will continue anticoagulation with Xarelto. -     TSH; Future  Essential hypertension - Her blood pressure is adequately well controlled.  Electrolytes and renal function are normal. -     CBC with Differential/Platelet; Future -     Comprehensive metabolic panel; Future  Coronary artery disease involving native coronary artery of native heart without angina pectoris- She has had no recent anginal symptoms.  Will continue risk factor modifications. -     Lipid panel; Future  Pure hypercholesterolemia- She has achieved her LDL goal and is doing well on the statin. -     Lipid panel; Future  Need for influenza vaccination -     Flu vaccine HIGH DOSE PF (Fluzone High dose)   I am having Tiffany Charles maintain her  Rivaroxaban, pravastatin, and metoprolol succinate.  No orders of the defined types were placed in this encounter.    Follow-up: Return in about 4 months (around 10/07/2018).  Scarlette [place], MD

## 2018-06-08 NOTE — Patient Instructions (Signed)

## 2018-06-10 ENCOUNTER — Telehealth: Payer: Self-pay

## 2018-06-10 NOTE — Telephone Encounter (Signed)
AWV needed

## 2018-06-11 NOTE — Telephone Encounter (Signed)
AWV scheduled 

## 2018-06-29 NOTE — Progress Notes (Signed)
Subjective:   Tiffany Charles is a 85 y.o. female who presents for Medicare Annual (Subsequent) preventive examination.  Review of Systems:  No ROS.  Medicare Wellness Visit. Additional risk factors are reflected in the social history.  Cardiac Risk Factors include: advanced age (>68men, >29 women);dyslipidemia;hypertension Sleep patterns: feels rested on waking, gets up 1 times nightly to void and sleeps 8-9 hours nightly.    Home Safety/Smoke Alarms: Feels safe in home. Smoke alarms in place.  Living environment; residence and Firearm Safety: 1-story house/ trailer. Lives with son, no needs for DME, good support system Seat Belt Safety/Bike Helmet: Wears seat belt.     Objective:     Vitals: BP 138/81   Pulse 79   Resp 17   Ht 5' 5.5" (1.664 m)   Wt 119 lb (54 kg)   SpO2 98%   BMI 19.50 kg/m   Body mass index is 19.5 kg/m.  Advanced Directives 06/30/2018 09/16/2016 07/31/2015 07/30/2015 12/28/2012 06/06/2011 06/04/2011  Does Patient Have a Medical Advance Directive? No No No No Patient has advance directive, copy not in chart Patient has advance directive, copy not in chart Patient has advance directive, copy not in chart  Type of Advance Directive - - - - Healthcare Power of [place] of [place] in Chart? - - - - Copy requested from family (No Data) -  Would patient like information on creating a medical advance directive? Yes (ED - Information included in AVS) - No - patient declined information No - patient declined information - - -    Tobacco Social History   Tobacco Use  Smoking Status Former Smoker  . Packs/day: 0.50  Smokeless Tobacco Never Used     Counseling given: No  Past Medical History:  Diagnosis Date  . Angina pectoris   . Arthritis   . Chronic anticoagulation    Xarelto  . Chronic diastolic (congestive) heart failure ([place])   . COPD (chronic obstructive pulmonary disease)  ([place])   . Coronary artery disease    a. s/p RCA stent 2008. b. cath 02/2009: 30% mLAD, 90% AV groove cx unchanged from prior, 50-60% RCA, EF 60% -> med rx.  . Depression   . Essential hypertension   . Hyperlipidemia   . Mild pulmonary hypertension ([place]) 09/17/2016  . Permanent atrial fibrillation   . Skin cancer Jan 2013   squamous cell- removed  . Stented coronary artery 2009   RCA stent  . Tricuspid regurgitation 09/17/2016   Past Surgical History:  Procedure Laterality Date  .  CATARACTS REMOVED    . ABDOMINAL HYSTERECTOMY    . CARDIAC CATHETERIZATION  03/22/2009   Started medical therapy  . CARDIOVERSION  06/05/2009   3 attempts, last attempt successful  . CORONARY ANGIOPLASTY WITH STENT PLACEMENT  2009   RCA Vision stent  . HIP SURGERY    . MELANOMA EXCISION  06/06/2011   Procedure: MELANOMA EXCISION;  Surgeon: Adin Hector, MD;  Location: [facility];  Service: General;  Laterality: Left;  re excision squamous cell lesion left chest wall    Family History  Problem Relation Age of Onset  . Stroke Mother   . Heart disease Mother   . Coronary artery disease Father   . Heart disease Father   . Stroke Sister   . Heart disease Brother   . Stroke Sister   . Stroke Sister    Social History  Socioeconomic History  . Marital status: Widowed    Spouse name: Not on file  . Number of children: 2  . Years of education: Not on file  . Highest education level: Not on file  Occupational History  . Occupation: Retired  Scientific laboratory technician  . Financial resource strain: Not hard at all  . Food insecurity:    Worry: Never true    Inability: Never true  . Transportation needs:    Medical: No    Non-medical: No  Tobacco Use  . Smoking status: Former Smoker    Packs/day: 0.50  . Smokeless tobacco: Never Used  Substance and Sexual Activity  . Alcohol use: No  . Drug use: No  . Sexual activity: Never  Lifestyle  . Physical activity:    Days per week: 0 days    Minutes per  session: 0 min  . Stress: Only a little  Relationships  . Social connections:    Talks on phone: More than three times a week    Gets together: More than three times a week    Attends religious service: More than 4 times per year    Active member of club or organization: Yes    Attends meetings of clubs or organizations: More than 4 times per year    Relationship status: Widowed  Other Topics Concern  . Not on file  Social History Narrative   Pt lives alone, son is nearby.    Outpatient Encounter Medications as of 06/30/2018  Medication Sig  . metoprolol succinate (TOPROL-XL) 25 MG 24 hr tablet Take 1 tablet (25 mg total) by mouth daily.  . pravastatin (PRAVACHOL) 40 MG tablet Take 1 tablet (40 mg total) by mouth at bedtime.  . Rivaroxaban (XARELTO) 15 MG TABS tablet Take 1 tablet (15 mg total) by mouth daily before supper.   No facility-administered encounter medications on file as of 06/30/2018.     Activities of Daily Living In your present state of health, do you have any difficulty performing the following activities: 06/30/2018  Hearing? N  Vision? N  Difficulty concentrating or making decisions? Y  Walking or climbing stairs? N  Dressing or bathing? N  Doing errands, shopping? Y  Preparing Food and eating ? N  Using the Toilet? N  In the past six months, have you accidently leaked urine? N  Do you have problems with loss of bowel control? N  Managing your Medications? Y  Managing your Finances? Y  Housekeeping or managing your Housekeeping? Y  Some recent data might be hidden    Patient Care Team: [name], MD as PCP - General (Internal Medicine) Sanda Klein, MD as Consulting Physician (Cardiology)    Assessment:   This is a routine wellness examination for [place]. Physical assessment deferred to PCP.  Exercise Activities and Dietary recommendations Current Exercise Habits: The patient does not participate in regular exercise at present, Exercise limited  by: None identified  Diet (meal preparation, eat out, water intake, caffeinated beverages, dairy products, fruits and vegetables): in general, a "healthy" diet  . Reports good appetite.   Discussed supplementing with Ensure to maintain weight, samples and coupons provided. Encouraged patient to increase daily water and healthy fluid intake.  Goals   None     Fall Risk Fall Risk  06/30/2018 04/29/2017 12/28/2014 11/07/2013  Falls in the past year? 0 No No No   Depression Screen PHQ 2/9 Scores 06/30/2018 04/29/2017 12/28/2014 11/07/2013  PHQ - 2 Score 0 0 0  2  PHQ- 9 Score 4 - - 9     Cognitive Function MMSE - Mini Mental State Exam 06/30/2018  Orientation to time 2  Orientation to Place 3  Registration 3  Attention/ Calculation 5  Recall 0  Language- name 2 objects 2  Language- repeat 1  Language- follow 3 step command 3  Language- read & follow direction 1  Write a sentence 1  Copy design 1  Total score 22        Immunization History  Administered Date(s) Administered  . Influenza, High Dose Seasonal PF 06/08/2018  . Influenza-Unspecified 06/09/2013, 12/24/2013, 04/15/2017  . Pneumococcal Conjugate-13 11/09/2013  . Pneumococcal Polysaccharide-23 02/28/2010, 04/29/2017  . Tdap 11/09/2013   Screening Tests Health Maintenance  Topic Date Due  . DEXA SCAN  06/09/2019 (Originally 04/20/1997)  . TETANUS/TDAP  11/10/2023  . INFLUENZA VACCINE  Completed  . PNA vac Low Risk Adult  Completed      Plan:     Senior community resources provided.  Reviewed health maintenance screenings with patient today and relevant education, vaccines, and/or referrals were provided.   Continue doing brain stimulating activities (puzzles, reading, adult coloring books, staying active) to keep memory sharp.   Continue to eat heart healthy diet (full of fruits, vegetables, whole grains, lean protein, water--limit salt, fat, and sugar intake) and increase physical activity as tolerated.  I have  personally reviewed and noted the following in the patient's chart:   . Medical and social history . Use of alcohol, tobacco or illicit drugs  . Current medications and supplements . Functional ability and status . Nutritional status . Physical activity . Advanced directives . List of other physicians . Vitals . Screenings to include cognitive, depression, and falls . Referrals and appointments  In addition, I have reviewed and discussed with patient certain preventive protocols, quality metrics, and best practice recommendations. A written personalized care plan for preventive services as well as general preventive health recommendations were provided to patient.     Michiel Cowboy, RN  06/30/2018

## 2018-06-30 ENCOUNTER — Ambulatory Visit (INDEPENDENT_AMBULATORY_CARE_PROVIDER_SITE_OTHER)
Admission: RE | Admit: 2018-06-30 | Discharge: 2018-06-30 | Disposition: A | Discharge status: Home / Self Care | Payer: Medicare HMO | Source: Ambulatory Visit | Attending: Internal Medicine | Admitting: Internal Medicine

## 2018-06-30 ENCOUNTER — Ambulatory Visit (INDEPENDENT_AMBULATORY_CARE_PROVIDER_SITE_OTHER): Payer: Medicare HMO | Admitting: *Deleted

## 2018-06-30 VITALS — BP 138/81 | HR 79 | Resp 17 | Ht 65.5 in | Wt 119.0 lb

## 2018-06-30 DIAGNOSIS — E2839 Other primary ovarian failure: Secondary | ICD-10-CM

## 2018-06-30 DIAGNOSIS — Z0000 Encounter for general adult medical examination without abnormal findings: Secondary | ICD-10-CM

## 2018-06-30 NOTE — Patient Instructions (Addendum)
Continue doing brain stimulating activities (puzzles, reading, adult coloring books, staying active) to keep memory sharp.   Continue to eat heart healthy diet (full of fruits, vegetables, whole grains, lean protein, water--limit salt, fat, and sugar intake) and increase physical activity as tolerated.  Health Maintenance, Female Adopting a healthy lifestyle and getting preventive care can go a long way to promote health and wellness. Talk with your health care provider about what schedule of regular examinations is right for you. This is a good chance for you to check in with your provider about disease prevention and staying healthy. In between checkups, there are plenty of things you can do on your own. Experts have done a lot of research about which lifestyle changes and preventive measures are most likely to keep you healthy. Ask your health care provider for more information. Weight and diet Eat a healthy diet  Be sure to include plenty of vegetables, fruits, low-fat dairy products, and lean protein.  Do not eat a lot of foods high in solid fats, added sugars, or salt.  Get regular exercise. This is one of the most important things you can do for your health. ? Most adults should exercise for at least 150 minutes each week. The exercise should increase your heart rate and make you sweat (moderate-intensity exercise). ? Most adults should also do strengthening exercises at least twice a week. This is in addition to the moderate-intensity exercise. Maintain a healthy weight  Body mass index (BMI) is a measurement that can be used to identify possible weight problems. It estimates body fat based on height and weight. Your health care provider can help determine your BMI and help you achieve or maintain a healthy weight.  For females 20 years of age and older: ? A BMI below 18.5 is considered underweight. ? A BMI of 18.5 to 24.9 is normal. ? A BMI of 25 to 29.9 is considered  overweight. ? A BMI of 30 and above is considered obese. Watch levels of cholesterol and blood lipids  You should start having your blood tested for lipids and cholesterol at 83 years of age, then have this test every 5 years.  You may need to have your cholesterol levels checked more often if: ? Your lipid or cholesterol levels are high. ? You are older than 83 years of age. ? You are at high risk for heart disease. Cancer screening Lung Cancer  Lung cancer screening is recommended for adults 55-80 years old who are at high risk for lung cancer because of a history of smoking.  A yearly low-dose CT scan of the lungs is recommended for people who: ? Currently smoke. ? Have quit within the past 15 years. ? Have at least a 30-pack-year history of smoking. A pack year is smoking an average of one pack of cigarettes a day for 1 year.  Yearly screening should continue until it has been 15 years since you quit.  Yearly screening should stop if you develop a health problem that would prevent you from having lung cancer treatment. Breast Cancer  Practice breast self-awareness. This means understanding how your breasts normally appear and feel.  It also means doing regular breast self-exams. Let your health care provider know about any changes, no matter how small.  If you are in your 20s or 30s, you should have a clinical breast exam (CBE) by a health care provider every 1-3 years as part of a regular health exam.  If you are 40 or   older, have a CBE every year. Also consider having a breast X-ray (mammogram) every year.  If you have a family history of breast cancer, talk to your health care provider about genetic screening.  If you are at high risk for breast cancer, talk to your health care provider about having an MRI and a mammogram every year.  Breast cancer gene (BRCA) assessment is recommended for women who have family members with BRCA-related cancers. BRCA-related cancers  include: ? Breast. ? Ovarian. ? Tubal. ? Peritoneal cancers.  Results of the assessment will determine the need for genetic counseling and BRCA1 and BRCA2 testing. Cervical Cancer Your health care provider may recommend that you be screened regularly for cancer of the pelvic organs (ovaries, uterus, and vagina). This screening involves a pelvic examination, including checking for microscopic changes to the surface of your cervix (Pap test). You may be encouraged to have this screening done every 3 years, beginning at age 69.  For women ages 28-65, health care providers may recommend pelvic exams and Pap testing every 3 years, or they may recommend the Pap and pelvic exam, combined with testing for human papilloma virus (HPV), every 5 years. Some types of HPV increase your risk of cervical cancer. Testing for HPV may also be done on women of any age with unclear Pap test results.  Other health care providers may not recommend any screening for nonpregnant women who are considered low risk for pelvic cancer and who do not have symptoms. Ask your health care provider if a screening pelvic exam is right for you.  If you have had past treatment for cervical cancer or a condition that could lead to cancer, you need Pap tests and screening for cancer for at least 20 years after your treatment. If Pap tests have been discontinued, your risk factors (such as having a new sexual partner) need to be reassessed to determine if screening should resume. Some women have medical problems that increase the chance of getting cervical cancer. In these cases, your health care provider may recommend more frequent screening and Pap tests. Colorectal Cancer  This type of cancer can be detected and often prevented.  Routine colorectal cancer screening usually begins at 83 years of age and continues through 83 years of age.  Your health care provider may recommend screening at an earlier age if you have risk factors for  colon cancer.  Your health care provider may also recommend using home test kits to check for hidden blood in the stool.  A small camera at the end of a tube can be used to examine your colon directly (sigmoidoscopy or colonoscopy). This is done to check for the earliest forms of colorectal cancer.  Routine screening usually begins at age 65.  Direct examination of the colon should be repeated every 5-10 years through 83 years of age. However, you may need to be screened more often if early forms of precancerous polyps or small growths are found. Skin Cancer  Check your skin from head to toe regularly.  Tell your health care provider about any new moles or changes in moles, especially if there is a change in a mole's shape or color.  Also tell your health care provider if you have a mole that is larger than the size of a pencil eraser.  Always use sunscreen. Apply sunscreen liberally and repeatedly throughout the day.  Protect yourself by wearing long sleeves, pants, a wide-brimmed hat, and sunglasses whenever you are outside. Heart disease, diabetes,  and high blood pressure  High blood pressure causes heart disease and increases the risk of stroke. High blood pressure is more likely to develop in: ? People who have blood pressure in the high end of the normal range (130-139/85-89 mm Hg). ? People who are overweight or obese. ? People who are African American.  If you are 18-64 years of age, have your blood pressure checked every 3-5 years. If you are 45 years of age or older, have your blood pressure checked every year. You should have your blood pressure measured twice-once when you are at a hospital or clinic, and once when you are not at a hospital or clinic. Record the average of the two measurements. To check your blood pressure when you are not at a hospital or clinic, you can use: ? An automated blood pressure machine at a pharmacy. ? A home blood pressure monitor.  If you are  between 22 years and 17 years old, ask your health care provider if you should take aspirin to prevent strokes.  Have regular diabetes screenings. This involves taking a blood sample to check your fasting blood sugar level. ? If you are at a normal weight and have a low risk for diabetes, have this test once every three years after 83 years of age. ? If you are overweight and have a high risk for diabetes, consider being tested at a younger age or more often. Preventing infection Hepatitis B  If you have a higher risk for hepatitis B, you should be screened for this virus. You are considered at high risk for hepatitis B if: ? You were born in a country where hepatitis B is common. Ask your health care provider which countries are considered high risk. ? Your parents were born in a high-risk country, and you have not been immunized against hepatitis B (hepatitis B vaccine). ? You have HIV or AIDS. ? You use needles to inject street drugs. ? You live with someone who has hepatitis B. ? You have had sex with someone who has hepatitis B. ? You get hemodialysis treatment. ? You take certain medicines for conditions, including cancer, organ transplantation, and autoimmune conditions. Hepatitis C  Blood testing is recommended for: ? Everyone born from [birth year] through 1963. ? Anyone with known risk factors for hepatitis C. Sexually transmitted infections (STIs)  You should be screened for sexually transmitted infections (STIs) including gonorrhea and chlamydia if: ? You are sexually active and are younger than 83 years of age. ? You are older than 83 years of age and your health care provider tells you that you are at risk for this type of infection. ? Your sexual activity has changed since you were last screened and you are at an increased risk for chlamydia or gonorrhea. Ask your health care provider if you are at risk.  If you do not have HIV, but are at risk, it may be recommended that you take  a prescription medicine daily to prevent HIV infection. This is called pre-exposure prophylaxis (PrEP). You are considered at risk if: ? You are sexually active and do not regularly use condoms or know the HIV status of your partner(s). ? You take drugs by injection. ? You are sexually active with a partner who has HIV. Talk with your health care provider about whether you are at high risk of being infected with HIV. If you choose to begin PrEP, you should first be tested for HIV. You should then be tested every  3 months for as long as you are taking PrEP. Pregnancy  If you are premenopausal and you may become pregnant, ask your health care provider about preconception counseling.  If you may become pregnant, take 400 to 800 micrograms (mcg) of folic acid every day.  If you want to prevent pregnancy, talk to your health care provider about birth control (contraception). Osteoporosis and menopause  Osteoporosis is a disease in which the bones lose minerals and strength with aging. This can result in serious bone fractures. Your risk for osteoporosis can be identified using a bone density scan.  If you are 65 years of age or older, or if you are at risk for osteoporosis and fractures, ask your health care provider if you should be screened.  Ask your health care provider whether you should take a calcium or vitamin D supplement to lower your risk for osteoporosis.  Menopause may have certain physical symptoms and risks.  Hormone replacement therapy may reduce some of these symptoms and risks. Talk to your health care provider about whether hormone replacement therapy is right for you. Follow these instructions at home:  Schedule regular health, dental, and eye exams.  Stay current with your immunizations.  Do not use any tobacco products including cigarettes, chewing tobacco, or electronic cigarettes.  If you are pregnant, do not drink alcohol.  If you are breastfeeding, limit how  much and how often you drink alcohol.  Limit alcohol intake to no more than 1 drink per day for nonpregnant women. One drink equals 12 ounces of beer, 5 ounces of wine, or 1 ounces of hard liquor.  Do not use street drugs.  Do not share needles.  Ask your health care provider for help if you need support or information about quitting drugs.  Tell your health care provider if you often feel depressed.  Tell your health care provider if you have ever been abused or do not feel safe at home. This information is not intended to replace advice given to you by your health care provider. Make sure you discuss any questions you have with your health care provider. Document Released: 11/25/2010 Document Revised: 10/18/2015 Document Reviewed: 02/13/2015 Elsevier Interactive Patient Education  2019 Elsevier Inc.  

## 2018-07-01 NOTE — Progress Notes (Signed)
Medical screening examination/treatment/procedure(s) were performed by non-physician practitioner and as supervising physician I was immediately available for consultation/collaboration. I agree with above. Shemekia Patane A Fransisco Messmer, MD 

## 2018-07-30 ENCOUNTER — Ambulatory Visit: Payer: Medicare HMO | Admitting: Physician Assistant

## 2018-08-02 ENCOUNTER — Encounter: Payer: Self-pay | Admitting: *Deleted

## 2018-08-03 DIAGNOSIS — C44729 Squamous cell carcinoma of skin of left lower limb, including hip: Secondary | ICD-10-CM | POA: Diagnosis not present

## 2018-08-03 DIAGNOSIS — C44722 Squamous cell carcinoma of skin of right lower limb, including hip: Secondary | ICD-10-CM | POA: Diagnosis not present

## 2018-08-03 DIAGNOSIS — Z85828 Personal history of other malignant neoplasm of skin: Secondary | ICD-10-CM | POA: Diagnosis not present

## 2018-08-03 DIAGNOSIS — L0889 Other specified local infections of the skin and subcutaneous tissue: Secondary | ICD-10-CM | POA: Diagnosis not present

## 2018-08-03 DIAGNOSIS — L011 Impetiginization of other dermatoses: Secondary | ICD-10-CM | POA: Diagnosis not present

## 2018-08-27 ENCOUNTER — Telehealth: Payer: Self-pay

## 2018-08-27 NOTE — Telephone Encounter (Addendum)
   Cardiac Questionnaire:    Since your last visit or hospitalization:    1. Have you been having new or worsening chest pain? NO   2. Have you been having new or worsening shortness of breath? NO 3. Have you been having new or worsening leg swelling, wt gain, or increase in abdominal girth (pants fitting more tightly)? NO   4. Have you had any passing out spells? NO    *A YES to any of these questions would result in the appointment being kept. *If all the answers to these questions are NO, we should indicate that given the current situation regarding the worldwide coronarvirus pandemic, at the recommendation of the CDC, we are looking to limit gatherings in our waiting area, and thus will reschedule their appointment beyond four weeks from today.   _____________   [id number] Pre-Screening Questions:  . Do you currently have a fever? NO (yes = cancel and refer to pcp for e-visit) . Have you recently travelled on a cruise, internationally, or to [place], [place], [place], [place], [place], or [place], [place] [employer]) ? NO (yes = cancel, stay home, monitor symptoms, and contact pcp or initiate e-visit if symptoms develop) . Have you been in contact with someone that is currently pending confirmation of Covid19 testing or has been confirmed to have the [place] virus?  NO (yes = cancel, stay home, away from tested individual, monitor symptoms, and contact pcp or initiate e-visit if symptoms develop) . Are you currently experiencing fatigue or cough? NO (yes = pt should be prepared to have a mask placed at the time of their visit).   SPOKE WITH PATIENTS Son AND HE WOULD LIKE TO DO A PHONE VISIT.

## 2018-08-27 NOTE — Telephone Encounter (Signed)
Virtual Visit Pre-Appointment Phone Call  Steps For Call:  1. Confirm consent - "In the setting of the current Covid19 crisis, you are scheduled for a PHONE visit with your provider on 08/30/2018 at 4:00PM.  Just as we do with many in-office visits, in order for you to participate in this visit, we must obtain consent.  If you'd like, I can send this to your mychart (if signed up) or email for you to review.  Otherwise, I can obtain your verbal consent now.  All virtual visits are billed to your insurance company just like a normal visit would be.  By agreeing to a virtual visit, we'd like you to understand that the technology does not allow for your provider to perform an examination, and thus may limit your provider's ability to fully assess your condition.  Finally, though the technology is pretty good, we cannot assure that it will always work on either your or our end, and in the setting of a video visit, we may have to convert it to a phone-only visit.  In either situation, we cannot ensure that we have a secure connection.  Are you willing to proceed?"  2. Give patient instructions for WebEx download to smartphone as below if video visit  3. Advise patient to be prepared with any vital sign or heart rhythm information, their current medicines, and a piece of paper and pen handy for any instructions they may receive the day of their visit  4. Inform patient they will receive a phone call 15 minutes prior to their appointment time (may be from unknown caller ID) so they should be prepared to answer  5. Confirm that appointment type is correct in Epic appointment notes (video vs telephone)    TELEPHONE CALL NOTE  Tiffany Charles has been deemed a candidate for a follow-up tele-health visit to limit community exposure during the Covid-19 pandemic. I spoke with the patient via phone to ensure availability of phone/video source, confirm preferred email & phone number, and discuss instructions  and expectations.  I reminded Tiffany Charles to be prepared with any vital sign and/or heart rhythm information that could potentially be obtained via home monitoring, at the time of her visit. I reminded Tiffany Charles to expect a phone call at the time of her visit if her visit.  Did the patient verbally acknowledge consent to treatment? YES  [name], CMA 08/27/2018 4:01 PM   DOWNLOADING THE Tiffany Charles  - If Apple, go to [employer] and type in WebEx in the search bar. Tiffany Charles, the blue/green circle. The app is free but as with any other app downloads, their phone may require them to verify saved payment information or Apple password. The patient does NOT have to create an account.  - If Android, ask patient to go to [employer] and type in WebEx in the search bar. Crowley Charles Starwood Charles, the blue/green circle. The app is free but as with any other app downloads, their phone may require them to verify saved payment information or Android password. The patient does NOT have to create an account.   CONSENT FOR TELE-HEALTH VISIT - PLEASE REVIEW  I hereby voluntarily request, consent and authorize [facility] and its employed or contracted physicians, physician assistants, nurse practitioners or other licensed health care professionals (the Practitioner), to provide me with telemedicine health care services (the "Services") as deemed necessary by the treating Practitioner. I acknowledge  and consent to receive the Services by the Practitioner via telemedicine. I understand that the telemedicine visit will involve communicating with the Practitioner through live audiovisual communication technology and the disclosure of certain medical information by electronic transmission. I acknowledge that I have been given the opportunity to request an in-person assessment or other available alternative prior to the telemedicine visit and am voluntarily  participating in the telemedicine visit.  I understand that I have the right to withhold or withdraw my consent to the use of telemedicine in the course of my care at any time, without affecting my right to future care or treatment, and that the Practitioner or I may terminate the telemedicine visit at any time. I understand that I have the right to inspect all information obtained and/or recorded in the course of the telemedicine visit and may receive copies of available information for a reasonable fee.  I understand that some of the potential risks of receiving the Services via telemedicine include:  Tiffany Charles Delay or interruption in medical evaluation due to technological equipment failure or disruption; . Information transmitted may not be sufficient (e.g. poor resolution of images) to allow for appropriate medical decision making by the Practitioner; and/or  . In rare instances, security protocols could fail, causing a breach of personal health information.  Furthermore, I acknowledge that it is my responsibility to provide information about my medical history, conditions and care that is complete and accurate to the best of my ability. I acknowledge that Practitioner's advice, recommendations, and/or decision may be based on factors not within their control, such as incomplete or inaccurate data provided by me or distortions of diagnostic images or specimens that may result from electronic transmissions. I understand that the practice of medicine is not an exact science and that Practitioner makes no warranties or guarantees regarding treatment outcomes. I acknowledge that I will receive a copy of this consent concurrently upon execution via email to the email address I last provided but may also request a printed copy by calling the office of Tiffany Charles.    I understand that my insurance will be billed for this visit.   I have read or had this consent read to me. . I understand the contents of this  consent, which adequately explains the benefits and risks of the Services being provided via telemedicine.  . I have been provided ample opportunity to ask questions regarding this consent and the Services and have had my questions answered to my satisfaction. . I give my informed consent for the services to be provided through the use of telemedicine in my medical care  By participating in this telemedicine visit I agree to the above.

## 2018-08-27 NOTE — Telephone Encounter (Deleted)
*   patients son not spouse.

## 2018-08-30 ENCOUNTER — Telehealth (INDEPENDENT_AMBULATORY_CARE_PROVIDER_SITE_OTHER): Payer: Medicare HMO | Admitting: Physician Assistant

## 2018-08-30 DIAGNOSIS — J449 Chronic obstructive pulmonary disease, unspecified: Secondary | ICD-10-CM

## 2018-08-30 DIAGNOSIS — I4821 Permanent atrial fibrillation: Secondary | ICD-10-CM | POA: Diagnosis not present

## 2018-08-30 DIAGNOSIS — I110 Hypertensive heart disease with heart failure: Secondary | ICD-10-CM

## 2018-08-30 DIAGNOSIS — E785 Hyperlipidemia, unspecified: Secondary | ICD-10-CM | POA: Diagnosis not present

## 2018-08-30 DIAGNOSIS — I10 Essential (primary) hypertension: Secondary | ICD-10-CM

## 2018-08-30 DIAGNOSIS — I5032 Chronic diastolic (congestive) heart failure: Secondary | ICD-10-CM

## 2018-08-30 DIAGNOSIS — I2510 Atherosclerotic heart disease of native coronary artery without angina pectoris: Secondary | ICD-10-CM

## 2018-08-30 MED ORDER — RIVAROXABAN 15 MG PO TABS: 15.0000 mg | ORAL_TABLET | Freq: Every day | ORAL | 2 refills | Status: DC

## 2018-08-30 MED ORDER — METOPROLOL SUCCINATE ER 25 MG PO TB24: 25.0000 mg | ORAL_TABLET | Freq: Every day | ORAL | 2 refills | Status: DC

## 2018-08-30 MED ORDER — PRAVASTATIN SODIUM 40 MG PO TABS: 40.0000 mg | ORAL_TABLET | Freq: Every day | ORAL | 2 refills | Status: DC

## 2018-08-30 NOTE — Patient Instructions (Signed)
Medication Instructions:   Your physician recommends that you continue on your current medications as directed. Please refer to the Current Medication list given to you today.  If you need a refill on your cardiac medications before your next appointment, please call your pharmacy.   Lab work:  [employer] ORDERED AT THIS TIME OF APPOINTMENT   If you have labs (blood work) drawn today and your tests are completely normal, you will receive your results only by: Marland Kitchen MyChart Message (if you have MyChart) OR . A paper copy in the mail If you have any lab test that is abnormal or we need to change your treatment, we will call you to review the results.  Testing/Procedures:  NONE ordered at this time of appointment   Follow-Up: At [facility], you and your health needs are our priority.  As part of our continuing mission to provide you with exceptional heart care, we have created designated Provider Care Teams.  These Care Teams include your primary Cardiologist (physician) and Advanced Practice Providers (APPs -  Physician Assistants and Nurse Practitioners) who all work together to provide you with the care you need, when you need it. You will need a follow up appointment in 3-4 months.  Please call our office 2 months in advance to schedule this appointment.  You may see Sanda Klein, MD or one of the following Advanced Practice Providers on your designated Care Team: [place], [place] . Fabian Sharp, PA-C  Any Other Special Instructions Will Be Listed Below (If Applicable).

## 2018-08-30 NOTE — Progress Notes (Signed)
Virtual Visit via Telephone Note   This visit type was conducted due to national recommendations for restrictions regarding the COVID-19 Pandemic (e.g. social distancing) in an effort to limit this patient's exposure and mitigate transmission in our community.  Due to her co-morbid illnesses, this patient is at least at moderate risk for complications without adequate follow up.  This format is felt to be most appropriate for this patient at this time.  The patient did not have access to video technology/had technical difficulties with video requiring transitioning to audio format only (telephone).  All issues noted in this document were discussed and addressed.  No physical exam could be performed with this format.  Please refer to the patient's chart for her  consent to telehealth for [facility].   Evaluation Performed:  Follow-up visit  Date:  09/01/2018   ID:  Tiffany Charles, DOB [birth year], MRN [id number]  Patient Location: Home  Provider Location: Home  PCP:  [name], MD  Cardiologist:  Sanda Klein, MD  Electrophysiologist:  None   Chief Complaint:  followup  History of Present Illness:    Tiffany Charles is a 85 y.o. female who presents via audio/video conferencing for a telehealth visit today.    Tiffany Charles is a 85 year old female with past medical history of chronic diastolic heart failure, HTN, HLD, permanent atrial fibrillation on Xarelto, COPD, CAD s/p PCI to RCA.  Last echocardiogram obtained on 09/17/2016 showed EF 55 to 60%, mild LVH, mild MR, severe LAE, moderately dilated right atrium, PA peak pressure 40 mmHg.  I last saw the patient in 2018 after she was admitted for chest pain.  She was not taking a lot of her medications.  She has very poor medication compliance.  She was seen by Rosaria Ferries, PA-C on 04/23/2017, at which point, she complained of fatigue and dizziness.  She was also quite forgetful as well and developed has significant dementia.  Her  last cardiology appointment was in March 2019, carvedilol was stopped in favor of Toprol-XL for better compliance.  Recent lab work shows uncontrolled LDL of 123, well-controlled total cholesterol, triglyceride and HDL.  Patient presents today for telephone visit.  Half of the story was obtained from her son who is taking care of her.  I did speak to the patient herself as well.  According to his son, she continued to develop signs of dementia and become very forgetful.  He does not know if the patient is truly compliant with any of her medication.  He plans to start using a pillbox to help the patient be more compliant with her medications.  Talking with the patient herself, she denies any chest pain, shortness of breath, lower extremity edema, orthopnea or PND.  She does not do much activity and according to her son, she sleeps in the bed for at least half of the day watching TV. She says she has been feeling fatigued in the past several month.  I reviewed her previous lab work in January, there is no sign of electrolyte imbalance, dehydration or anemia to explain her symptoms.  I encouraged her to increase activity level.    The patient does not have symptoms concerning for COVID-19 infection (fever, chills, cough, or new shortness of breath).    Past Medical History:  Diagnosis Date  . Angina pectoris   . Arthritis   . Chronic anticoagulation    Xarelto  . Chronic diastolic (congestive) heart failure ([place])   . COPD (chronic  obstructive pulmonary disease) ([place])   . Coronary artery disease    a. s/p RCA stent 2008. b. cath 02/2009: 30% mLAD, 90% AV groove cx unchanged from prior, 50-60% RCA, EF 60% -> med rx.  . Depression   . Essential hypertension   . Hyperlipidemia   . Mild pulmonary hypertension ([place]) 09/17/2016  . Permanent atrial fibrillation   . Skin cancer Jan 2013   squamous cell- removed  . Stented coronary artery 2009   RCA stent  . Tricuspid regurgitation 09/17/2016   Past  Surgical History:  Procedure Laterality Date  .  CATARACTS REMOVED    . ABDOMINAL HYSTERECTOMY    . CARDIAC CATHETERIZATION  03/22/2009   Started medical therapy  . CARDIOVERSION  06/05/2009   3 attempts, last attempt successful  . CORONARY ANGIOPLASTY WITH STENT PLACEMENT  2009   RCA Vision stent  . HIP SURGERY    . MELANOMA EXCISION  06/06/2011   Procedure: MELANOMA EXCISION;  Surgeon: Adin Hector, MD;  Location: [facility];  Service: General;  Laterality: Left;  re excision squamous cell lesion left chest wall      Current Meds  Medication Sig  . metoprolol succinate (TOPROL-XL) 25 MG 24 hr tablet Take 1 tablet (25 mg total) by mouth daily.  . pravastatin (PRAVACHOL) 40 MG tablet Take 1 tablet (40 mg total) by mouth at bedtime.  . Rivaroxaban (XARELTO) 15 MG TABS tablet Take 1 tablet (15 mg total) by mouth daily before supper.  . [DISCONTINUED] metoprolol succinate (TOPROL-XL) 25 MG 24 hr tablet Take 1 tablet (25 mg total) by mouth daily.  . [DISCONTINUED] pravastatin (PRAVACHOL) 40 MG tablet Take 1 tablet (40 mg total) by mouth at bedtime.  . [DISCONTINUED] Rivaroxaban (XARELTO) 15 MG TABS tablet Take 1 tablet (15 mg total) by mouth daily before supper.     Allergies:   Codeine   Social History   Tobacco Use  . Smoking status: Former Smoker    Packs/day: 0.50  . Smokeless tobacco: Never Used  Substance Use Topics  . Alcohol use: No  . Drug use: No     Family Hx: The patient's family history includes Coronary artery disease in her father; Heart disease in her brother, father, and mother; Stroke in her mother, sister, sister, and sister.  ROS:   Please see the history of present illness.     All other systems reviewed and are negative.   Prior CV studies:   The following studies were reviewed today:  Echo 09/17/2016 LV EF: 55% -   60%  ------------------------------------------------------------------- Indications:      CHF - 428.0.   ------------------------------------------------------------------- History:   PMH:   Atrial fibrillation.  Coronary artery disease. Congestive heart failure.  Chronic obstructive pulmonary disease.   ------------------------------------------------------------------- Study Conclusions  - Left ventricle: The cavity size was normal. Wall thickness was   increased in a pattern of mild LVH. Indeterminant diastolic   function (atrial fibrillation). Systolic function was normal. The   estimated ejection fraction was in the range of 55% to 60%. Wall   motion was normal; there were no regional wall motion   abnormalities. - Aortic valve: There was no stenosis. - Mitral valve: There was mild regurgitation. - Left atrium: The atrium was severely dilated. - Right ventricle: The cavity size was normal. Systolic function   was mildly reduced. - Right atrium: The atrium was moderately dilated. - Tricuspid valve: There was moderate regurgitation. Peak RV-RA   gradient (S): 32 mm Hg. -  Pulmonary arteries: PA peak pressure: 40 mm Hg (S). - Systemic veins: IVC measured 1.9 cm with < 50% respirophasic   variation, suggesting RA pressure 8 mmHg.  Impressions:  - The patient was in atrial fibrillation. Normal LV size with mild   LV hypertrophy. EF 55-60%. Normal RV size with mildly decreased   systolic function. Biatrial enlargement. Mild MR, moderate TR.   Mild pulmonary hypertension.   Labs/Other Tests and Data Reviewed:    EKG:  An ECG dated 04/23/2017 was personally reviewed today and demonstrated:  Atrial fibrillation  Recent Labs: 06/08/2018: ALT 6; BUN 13; Creatinine, Ser 0.94; Hemoglobin 14.5; Platelets 190.0; Potassium 3.7; Sodium 138; TSH 1.50   Recent Lipid Panel Lab Results  Component Value Date/Time   CHOL 194 06/08/2018 03:20 PM   CHOL 170 07/30/2017 11:02 AM   TRIG 95.0 06/08/2018 03:20 PM   HDL 52.10 06/08/2018 03:20 PM   HDL 60 07/30/2017 11:02 AM   CHOLHDL 4  06/08/2018 03:20 PM   LDLCALC 123 (H) 06/08/2018 03:20 PM   LDLCALC 99 07/30/2017 11:02 AM    Wt Readings from Last 3 Encounters:  06/30/18 119 lb (54 kg)  06/08/18 122 lb 8 oz (55.6 kg)  07/30/17 126 lb 12.8 oz (57.5 kg)     Objective:    Vital Signs:  There were no vitals taken for this visit.   Well nourished, well developed female in no acute distress.   ASSESSMENT & PLAN:    1. Chronic diastolic heart failure: She denies any lower extremity edema or heart failure symptoms.    2. Permanent atrial fibrillation: She is on metoprolol and Xarelto.  I will refill her medications.  3. Hypertension: She does not check her vital signs at home.  I encouraged his son who lives with her to check her blood pressure once a week  4. Hyperlipidemia: There is questionable compliance with her medications.  Her son is planning to set her weekly medications in a pillbox.  She is on Pravachol, will refill her medication.  5. COPD: Denies any recent worsening shortness of breath  6. CAD s/p PCI to RCA: Not on aspirin given the need for Xarelto.  Denies any chest pain.   COVID-19 Education: The signs and symptoms of COVID-19 were discussed with the patient and how to seek care for testing (follow up with PCP or arrange E-visit).  The importance of social distancing was discussed today.  Time:   Today, I have spent 8 minutes with the patient with telehealth technology discussing the above problems.     Medication Adjustments/Labs and Tests Ordered: Current medicines are reviewed at length with the patient today.  Concerns regarding medicines are outlined above.  Tests Ordered: No orders of the defined types were placed in this encounter.  Medication Changes: Meds ordered this encounter  Medications  . Rivaroxaban (XARELTO) 15 MG TABS tablet    Sig: Take 1 tablet (15 mg total) by mouth daily before supper.    Dispense:  90 tablet    Refill:  2  . pravastatin (PRAVACHOL) 40 MG tablet     Sig: Take 1 tablet (40 mg total) by mouth at bedtime.    Dispense:  90 tablet    Refill:  2  . metoprolol succinate (TOPROL-XL) 25 MG 24 hr tablet    Sig: Take 1 tablet (25 mg total) by mouth daily.    Dispense:  90 tablet    Refill:  2    Disposition:  Follow up  in 3 month(s)  Signed, Almyra Deforest, [place]  09/01/2018 11:38 PM    [facility]

## 2018-09-01 DIAGNOSIS — C44722 Squamous cell carcinoma of skin of right lower limb, including hip: Secondary | ICD-10-CM | POA: Diagnosis not present

## 2018-09-01 DIAGNOSIS — Z79899 Other long term (current) drug therapy: Secondary | ICD-10-CM | POA: Diagnosis not present

## 2018-09-01 DIAGNOSIS — C44729 Squamous cell carcinoma of skin of left lower limb, including hip: Secondary | ICD-10-CM | POA: Diagnosis not present

## 2018-09-01 DIAGNOSIS — Z85828 Personal history of other malignant neoplasm of skin: Secondary | ICD-10-CM | POA: Diagnosis not present

## 2018-09-01 DIAGNOSIS — D485 Neoplasm of uncertain behavior of skin: Secondary | ICD-10-CM | POA: Diagnosis not present

## 2019-01-05 DIAGNOSIS — Z79899 Other long term (current) drug therapy: Secondary | ICD-10-CM | POA: Diagnosis not present

## 2019-01-05 DIAGNOSIS — C4401 Basal cell carcinoma of skin of lip: Secondary | ICD-10-CM | POA: Diagnosis not present

## 2019-01-05 DIAGNOSIS — Z85828 Personal history of other malignant neoplasm of skin: Secondary | ICD-10-CM | POA: Diagnosis not present

## 2019-01-05 DIAGNOSIS — C44722 Squamous cell carcinoma of skin of right lower limb, including hip: Secondary | ICD-10-CM | POA: Diagnosis not present

## 2019-01-05 DIAGNOSIS — C44729 Squamous cell carcinoma of skin of left lower limb, including hip: Secondary | ICD-10-CM | POA: Diagnosis not present

## 2019-01-05 DIAGNOSIS — C44319 Basal cell carcinoma of skin of other parts of face: Secondary | ICD-10-CM | POA: Diagnosis not present

## 2019-01-07 ENCOUNTER — Ambulatory Visit: Payer: Medicare HMO | Admitting: Cardiovascular Disease

## 2019-01-10 ENCOUNTER — Other Ambulatory Visit: Payer: Self-pay

## 2019-01-10 ENCOUNTER — Encounter: Payer: Self-pay | Admitting: Physician Assistant

## 2019-01-10 ENCOUNTER — Ambulatory Visit (INDEPENDENT_AMBULATORY_CARE_PROVIDER_SITE_OTHER): Payer: Medicare HMO | Admitting: Physician Assistant

## 2019-01-10 VITALS — BP 155/96 | HR 96 | Ht 63.0 in | Wt 118.6 lb

## 2019-01-10 DIAGNOSIS — I4821 Permanent atrial fibrillation: Secondary | ICD-10-CM | POA: Diagnosis not present

## 2019-01-10 DIAGNOSIS — I2510 Atherosclerotic heart disease of native coronary artery without angina pectoris: Secondary | ICD-10-CM

## 2019-01-10 DIAGNOSIS — E785 Hyperlipidemia, unspecified: Secondary | ICD-10-CM | POA: Diagnosis not present

## 2019-01-10 DIAGNOSIS — I10 Essential (primary) hypertension: Secondary | ICD-10-CM

## 2019-01-10 DIAGNOSIS — I5032 Chronic diastolic (congestive) heart failure: Secondary | ICD-10-CM | POA: Diagnosis not present

## 2019-01-10 NOTE — Progress Notes (Signed)
Cardiology Office Note    Date:  01/12/2019   ID:  Tiffany Charles, DOB 1929/09/20, MRN [id number]  PCP:  [name], MD  Cardiologist:  Dr. Sallyanne Kuster  Chief Complaint  Patient presents with  . Follow-up    seen for Dr. Nelle Don    History of Present Illness:  Tiffany Charles is a 85 y.o. female with PMH of chronic diastolic heart failure, HTN, HLD, permanent atrial fibrillation on Xarelto, COPD, CAD s/p PCI to RCA.  Last echocardiogram obtained on 09/17/2016 showed EF 55 to 60%, mild LVH, mild MR, severe LAE, moderately dilated right atrium, PA peak pressure 40 mmHg.  I last saw the patient in 2018 after she was admitted for chest pain.  She was not taking a lot of her medications.  She has very poor medication compliance.  She was seen by Rosaria Ferries, PA-C on 04/23/2017, at which point, she complained of fatigue and dizziness.  She was also quite forgetful as well and has significant dementia.    Patient presents today along with her son.  According to her son, she is still not taking her medications sometimes.  She is only on 3 medication at this time.  I instructed her son to give her all 3 medication around 6 PM.  We emphasized on the importance of compliance with medications.  Otherwise she denies any chest pain, shortness of breath, lower extremity edema, orthopnea or PND.   Past Medical History:  Diagnosis Date  . Angina pectoris   . Arthritis   . Chronic anticoagulation    Xarelto  . Chronic diastolic (congestive) heart failure ([place])   . COPD (chronic obstructive pulmonary disease) ([place])   . Coronary artery disease    a. s/p RCA stent 2008. b. cath 02/2009: 30% mLAD, 90% AV groove cx unchanged from prior, 50-60% RCA, EF 60% -> med rx.  . Depression   . Essential hypertension   . Hyperlipidemia   . Mild pulmonary hypertension ([place]) 09/17/2016  . Permanent atrial fibrillation   . Skin cancer Jan 2013   squamous cell- removed  . Stented coronary artery 2009   RCA  stent  . Tricuspid regurgitation 09/17/2016    Past Surgical History:  Procedure Laterality Date  .  CATARACTS REMOVED    . ABDOMINAL HYSTERECTOMY    . CARDIAC CATHETERIZATION  03/22/2009   Started medical therapy  . CARDIOVERSION  06/05/2009   3 attempts, last attempt successful  . CORONARY ANGIOPLASTY WITH STENT PLACEMENT  2009   RCA Vision stent  . HIP SURGERY    . MELANOMA EXCISION  06/06/2011   Procedure: MELANOMA EXCISION;  Surgeon: Adin Hector, MD;  Location: [facility];  Service: General;  Laterality: Left;  re excision squamous cell lesion left chest wall     Current Medications: Outpatient Medications Prior to Visit  Medication Sig Dispense Refill  . metoprolol succinate (TOPROL-XL) 25 MG 24 hr tablet Take 1 tablet (25 mg total) by mouth daily. 90 tablet 2  . pravastatin (PRAVACHOL) 40 MG tablet Take 1 tablet (40 mg total) by mouth at bedtime. 90 tablet 2  . Rivaroxaban (XARELTO) 15 MG TABS tablet Take 1 tablet (15 mg total) by mouth daily before supper. 90 tablet 2   No facility-administered medications prior to visit.      Allergies:   Codeine   Social History   Socioeconomic History  . Marital status: Widowed    Spouse name: Not on file  . Number of  children: 2  . Years of education: Not on file  . Highest education level: Not on file  Occupational History  . Occupation: Retired  Scientific laboratory technician  . Financial resource strain: Not hard at all  . Food insecurity    Worry: Never true    Inability: Never true  . Transportation needs    Medical: No    Non-medical: No  Tobacco Use  . Smoking status: Former Smoker    Packs/day: 0.50  . Smokeless tobacco: Never Used  Substance and Sexual Activity  . Alcohol use: No  . Drug use: No  . Sexual activity: Never  Lifestyle  . Physical activity    Days per week: 0 days    Minutes per session: 0 min  . Stress: Only a little  Relationships  . Social connections    Talks on phone: More than three times a week     Gets together: More than three times a week    Attends religious service: More than 4 times per year    Active member of club or organization: Yes    Attends meetings of clubs or organizations: More than 4 times per year    Relationship status: Widowed  Other Topics Concern  . Not on file  Social History Narrative   Pt lives alone, son is nearby.     Family History:  The patient's family history includes Coronary artery disease in her father; Heart disease in her brother, father, and mother; Stroke in her mother, sister, sister, and sister.   ROS:   Please see the history of present illness.    ROS All other systems reviewed and are negative.   PHYSICAL EXAM:   VS:  BP (!) 155/96   Pulse 96   Ht 5\' 3"  (1.6 m)   Wt 118 lb 9.6 oz (53.8 kg)   SpO2 98%   BMI 21.01 kg/m    GEN: Well nourished, well developed, in no acute distress  HEENT: normal  Neck: no JVD, carotid bruits, or masses Cardiac: RRR; no murmurs, rubs, or gallops,no edema  Respiratory:  clear to auscultation bilaterally, normal work of breathing GI: soft, nontender, nondistended, + BS MS: no deformity or atrophy  Skin: warm and dry, no rash Neuro:  Alert and Oriented x 3, Strength and sensation are intact Psych: euthymic mood, full affect  Wt Readings from Last 3 Encounters:  01/10/19 118 lb 9.6 oz (53.8 kg)  06/30/18 119 lb (54 kg)  06/08/18 122 lb 8 oz (55.6 kg)      Studies/Labs Reviewed:   EKG:  EKG is ordered today.  The ekg ordered today demonstrates atrial fibrillation with T wave inversion in the anterior leads, occasional PVC.  Recent Labs: 06/08/2018: ALT 6; BUN 13; Creatinine, Ser 0.94; Hemoglobin 14.5; Platelets 190.0; Potassium 3.7; Sodium 138; TSH 1.50   Lipid Panel    Component Value Date/Time   CHOL 194 06/08/2018 1520   CHOL 170 07/30/2017 1102   TRIG 95.0 06/08/2018 1520   HDL 52.10 06/08/2018 1520   HDL 60 07/30/2017 1102   CHOLHDL 4 06/08/2018 1520   VLDL 19.0 06/08/2018 1520    LDLCALC 123 (H) 06/08/2018 1520   LDLCALC 99 07/30/2017 1102    Additional studies/ records that were reviewed today include:   Echo 09/17/2016 LV EF: 55% -   60%  ------------------------------------------------------------------- Indications:      CHF - 428.0.  ------------------------------------------------------------------- History:   PMH:   Atrial fibrillation.  Coronary artery disease. Congestive  heart failure.  Chronic obstructive pulmonary disease.   ------------------------------------------------------------------- Study Conclusions  - Left ventricle: The cavity size was normal. Wall thickness was   increased in a pattern of mild LVH. Indeterminant diastolic   function (atrial fibrillation). Systolic function was normal. The   estimated ejection fraction was in the range of 55% to 60%. Wall   motion was normal; there were no regional wall motion   abnormalities. - Aortic valve: There was no stenosis. - Mitral valve: There was mild regurgitation. - Left atrium: The atrium was severely dilated. - Right ventricle: The cavity size was normal. Systolic function   was mildly reduced. - Right atrium: The atrium was moderately dilated. - Tricuspid valve: There was moderate regurgitation. Peak RV-RA   gradient (S): 32 mm Hg. - Pulmonary arteries: PA peak pressure: 40 mm Hg (S). - Systemic veins: IVC measured 1.9 cm with < 50% respirophasic   variation, suggesting RA pressure 8 mmHg.  Impressions:  - The patient was in atrial fibrillation. Normal LV size with mild   LV hypertrophy. EF 55-60%. Normal RV size with mildly decreased   systolic function. Biatrial enlargement. Mild MR, moderate TR.   Mild pulmonary hypertension.    ASSESSMENT:    1. Permanent atrial fibrillation   2. Essential hypertension   3. Hyperlipidemia LDL goal <70   4. Chronic diastolic (congestive) heart failure ([place])   5. Coronary artery disease involving native coronary artery of native  heart without angina pectoris      PLAN:  In order of problems listed above:  1. Permanent atrial fibrillation: Rate controlled on low-dose metoprolol.  Continue Xarelto.  2. CAD: Not on aspirin given the need for Xarelto.  Continue statin therapy.  3. Hyperlipidemia: Continue pravastatin 40 mg daily.  4. Hypertension: Blood pressure acceptable  5. Chronic diastolic heart failure: Euvolemic on physical exam.  Not on any diuretic.    Medication Adjustments/Labs and Tests Ordered: Current medicines are reviewed at length with the patient today.  Concerns regarding medicines are outlined above.  Medication changes, Labs and Tests ordered today are listed in the Patient Instructions below. Patient Instructions  Medication Instructions:  Your physician recommends that you continue on your current medications as directed. Please refer to the Current Medication list given to you today.  If you need a refill on your cardiac medications before your next appointment, please call your pharmacy.   Lab work: NONE ordered at this time of appointment   If you have labs (blood work) drawn today and your tests are completely normal, you will receive your results only by: Marland Kitchen MyChart Message (if you have MyChart) OR . A paper copy in the mail If you have any lab test that is abnormal or we need to change your treatment, we will call you to review the results.  Testing/Procedures: NONE ordered at this time of appointment   Follow-Up: At [facility], you and your health needs are our priority.  As part of our continuing mission to provide you with exceptional heart care, we have created designated Provider Care Teams.  These Care Teams include your primary Cardiologist (physician) and Advanced Practice Providers (APPs -  Physician Assistants and Nurse Practitioners) who all work together to provide you with the care you need, when you need it. You will need a follow up appointment in 6 months  (February 2021).  Please call our office in December 2020 to schedule this appointment.  You may see Sanda Klein, MD or one of  the following Advanced Practice Providers on your designated Care Team: [place], [place] . Fabian Sharp, PA-C  Any Other Special Instructions Will Be Listed Below (If Applicable).       Hilbert Corrigan, [place]  01/12/2019 11:25 AM    [facility] [address], [place], [place]  [address] Phone: [phone]; Fax: [phone]

## 2019-01-10 NOTE — Patient Instructions (Addendum)
Medication Instructions:  Your physician recommends that you continue on your current medications as directed. Please refer to the Current Medication list given to you today.  If you need a refill on your cardiac medications before your next appointment, please call your pharmacy.   Lab work: NONE ordered at this time of appointment   If you have labs (blood work) drawn today and your tests are completely normal, you will receive your results only by: Marland Kitchen MyChart Message (if you have MyChart) OR . A paper copy in the mail If you have any lab test that is abnormal or we need to change your treatment, we will call you to review the results.  Testing/Procedures: NONE ordered at this time of appointment   Follow-Up: At [facility], you and your health needs are our priority.  As part of our continuing mission to provide you with exceptional heart care, we have created designated Provider Care Teams.  These Care Teams include your primary Cardiologist (physician) and Advanced Practice Providers (APPs -  Physician Assistants and Nurse Practitioners) who all work together to provide you with the care you need, when you need it. You will need a follow up appointment in 6 months (February 2021).  Please call our office in December 2020 to schedule this appointment.  You may see Sanda Klein, MD or one of the following Advanced Practice Providers on your designated Care Team: [place], [place] . Fabian Sharp, PA-C  Any Other Special Instructions Will Be Listed Below (If Applicable).

## 2019-01-12 ENCOUNTER — Encounter: Payer: Self-pay | Admitting: Physician Assistant

## 2019-05-02 ENCOUNTER — Other Ambulatory Visit: Payer: Self-pay | Admitting: Cardiovascular Disease

## 2019-05-02 NOTE — Telephone Encounter (Signed)
New Message   *STAT* If patient is at the pharmacy, call can be transferred to refill team.   1. Which medications need to be refilled? (please list name of each medication and dose if known) metoprolol succinate (TOPROL-XL) 25 MG 24 hr tablet  2. Which pharmacy/location (including street and city if local pharmacy) is medication to be sent to?[place], [place] they need a 30 day or 90 day supply? 30 Day

## 2019-05-03 MED ORDER — METOPROLOL SUCCINATE ER 25 MG PO TB24: 25.0000 mg | ORAL_TABLET | Freq: Every day | ORAL | 2 refills | Status: DC

## 2019-05-05 ENCOUNTER — Ambulatory Visit: Payer: Medicare HMO | Admitting: Cardiovascular Disease

## 2019-05-05 ENCOUNTER — Telehealth: Payer: Self-pay | Admitting: Cardiovascular Disease

## 2019-05-05 MED ORDER — PRAVASTATIN SODIUM 40 MG PO TABS: 40.0000 mg | ORAL_TABLET | Freq: Every day | ORAL | 0 refills | Status: DC

## 2019-05-05 MED ORDER — METOPROLOL SUCCINATE ER 25 MG PO TB24: 25.0000 mg | ORAL_TABLET | Freq: Every day | ORAL | 0 refills | Status: DC

## 2019-05-05 MED ORDER — RIVAROXABAN 15 MG PO TABS: 15.0000 mg | ORAL_TABLET | Freq: Every day | ORAL | 0 refills | Status: DC

## 2019-05-05 NOTE — Telephone Encounter (Signed)
Pt has fu apt 05/18/2019, refilled toprol, pravachol, and eliquis

## 2019-05-05 NOTE — Telephone Encounter (Signed)
New Message  Pt c/o medication issue:  1. Name of Medication: metoprolol succinate (TOPROL-XL) 25 MG 24 hr tablet; pravastatin (PRAVACHOL) 40 MG tablet; Rivaroxaban (XARELTO) 15 MG TABS tablet  2. How are you currently taking this medication (dosage and times per day)? As written  3. Are you having a reaction (difficulty breathing--STAT)? No  4. What is your medication issue? Out of medication; Needs new prescription

## 2019-05-18 ENCOUNTER — Ambulatory Visit: Payer: Medicare HMO | Admitting: Cardiovascular Disease

## 2019-06-03 ENCOUNTER — Emergency Department (HOSPITAL_COMMUNITY): Payer: Medicare HMO

## 2019-06-03 ENCOUNTER — Encounter (HOSPITAL_COMMUNITY): Payer: Self-pay | Admitting: Emergency Medicine

## 2019-06-03 ENCOUNTER — Other Ambulatory Visit: Payer: Self-pay

## 2019-06-03 ENCOUNTER — Emergency Department (HOSPITAL_COMMUNITY)
Admission: EM | Admit: 2019-06-03 | Discharge: 2019-06-04 | Disposition: A | Discharge status: Home / Self Care | Payer: Medicare HMO | Source: Home / Self Care | Attending: Emergency Medicine | Admitting: Emergency Medicine

## 2019-06-03 DIAGNOSIS — Z79899 Other long term (current) drug therapy: Secondary | ICD-10-CM | POA: Insufficient documentation

## 2019-06-03 DIAGNOSIS — R55 Syncope and collapse: Secondary | ICD-10-CM | POA: Diagnosis not present

## 2019-06-03 DIAGNOSIS — I6340 Cerebral infarction due to embolism of unspecified cerebral artery: Secondary | ICD-10-CM | POA: Diagnosis not present

## 2019-06-03 DIAGNOSIS — R0902 Hypoxemia: Secondary | ICD-10-CM | POA: Diagnosis not present

## 2019-06-03 DIAGNOSIS — M1990 Unspecified osteoarthritis, unspecified site: Secondary | ICD-10-CM | POA: Diagnosis present

## 2019-06-03 DIAGNOSIS — I361 Nonrheumatic tricuspid (valve) insufficiency: Secondary | ICD-10-CM | POA: Diagnosis not present

## 2019-06-03 DIAGNOSIS — J449 Chronic obstructive pulmonary disease, unspecified: Secondary | ICD-10-CM | POA: Diagnosis present

## 2019-06-03 DIAGNOSIS — I10 Essential (primary) hypertension: Secondary | ICD-10-CM | POA: Diagnosis not present

## 2019-06-03 DIAGNOSIS — G309 Alzheimer's disease, unspecified: Secondary | ICD-10-CM | POA: Diagnosis present

## 2019-06-03 DIAGNOSIS — I639 Cerebral infarction, unspecified: Secondary | ICD-10-CM | POA: Diagnosis not present

## 2019-06-03 DIAGNOSIS — Z85828 Personal history of other malignant neoplasm of skin: Secondary | ICD-10-CM | POA: Insufficient documentation

## 2019-06-03 DIAGNOSIS — I071 Rheumatic tricuspid insufficiency: Secondary | ICD-10-CM | POA: Diagnosis present

## 2019-06-03 DIAGNOSIS — I360 Nonrheumatic tricuspid (valve) stenosis: Secondary | ICD-10-CM | POA: Diagnosis not present

## 2019-06-03 DIAGNOSIS — I4821 Permanent atrial fibrillation: Secondary | ICD-10-CM | POA: Diagnosis not present

## 2019-06-03 DIAGNOSIS — Z7901 Long term (current) use of anticoagulants: Secondary | ICD-10-CM | POA: Insufficient documentation

## 2019-06-03 DIAGNOSIS — I4891 Unspecified atrial fibrillation: Secondary | ICD-10-CM | POA: Diagnosis not present

## 2019-06-03 DIAGNOSIS — W19XXXA Unspecified fall, initial encounter: Secondary | ICD-10-CM

## 2019-06-03 DIAGNOSIS — T45516A Underdosing of anticoagulants, initial encounter: Secondary | ICD-10-CM | POA: Diagnosis present

## 2019-06-03 DIAGNOSIS — Z87891 Personal history of nicotine dependence: Secondary | ICD-10-CM | POA: Insufficient documentation

## 2019-06-03 DIAGNOSIS — Z9114 Patient's other noncompliance with medication regimen: Secondary | ICD-10-CM | POA: Diagnosis not present

## 2019-06-03 DIAGNOSIS — S199XXA Unspecified injury of neck, initial encounter: Secondary | ICD-10-CM | POA: Diagnosis not present

## 2019-06-03 DIAGNOSIS — Z20822 Contact with and (suspected) exposure to covid-19: Secondary | ICD-10-CM | POA: Diagnosis not present

## 2019-06-03 DIAGNOSIS — Z23 Encounter for immunization: Secondary | ICD-10-CM | POA: Insufficient documentation

## 2019-06-03 DIAGNOSIS — S51812A Laceration without foreign body of left forearm, initial encounter: Secondary | ICD-10-CM | POA: Insufficient documentation

## 2019-06-03 DIAGNOSIS — Z85820 Personal history of malignant melanoma of skin: Secondary | ICD-10-CM | POA: Diagnosis not present

## 2019-06-03 DIAGNOSIS — I5032 Chronic diastolic (congestive) heart failure: Secondary | ICD-10-CM | POA: Insufficient documentation

## 2019-06-03 DIAGNOSIS — S59912A Unspecified injury of left forearm, initial encounter: Secondary | ICD-10-CM | POA: Diagnosis not present

## 2019-06-03 DIAGNOSIS — S0990XA Unspecified injury of head, initial encounter: Secondary | ICD-10-CM | POA: Diagnosis not present

## 2019-06-03 DIAGNOSIS — R531 Weakness: Secondary | ICD-10-CM | POA: Diagnosis not present

## 2019-06-03 DIAGNOSIS — Y9389 Activity, other specified: Secondary | ICD-10-CM | POA: Insufficient documentation

## 2019-06-03 DIAGNOSIS — R4189 Other symptoms and signs involving cognitive functions and awareness: Secondary | ICD-10-CM | POA: Diagnosis not present

## 2019-06-03 DIAGNOSIS — Z66 Do not resuscitate: Secondary | ICD-10-CM | POA: Diagnosis not present

## 2019-06-03 DIAGNOSIS — Y998 Other external cause status: Secondary | ICD-10-CM | POA: Insufficient documentation

## 2019-06-03 DIAGNOSIS — I2510 Atherosclerotic heart disease of native coronary artery without angina pectoris: Secondary | ICD-10-CM | POA: Insufficient documentation

## 2019-06-03 DIAGNOSIS — Z955 Presence of coronary angioplasty implant and graft: Secondary | ICD-10-CM | POA: Insufficient documentation

## 2019-06-03 DIAGNOSIS — W1830XA Fall on same level, unspecified, initial encounter: Secondary | ICD-10-CM | POA: Diagnosis present

## 2019-06-03 DIAGNOSIS — Y92009 Unspecified place in unspecified non-institutional (private) residence as the place of occurrence of the external cause: Secondary | ICD-10-CM | POA: Diagnosis not present

## 2019-06-03 DIAGNOSIS — M79632 Pain in left forearm: Secondary | ICD-10-CM | POA: Diagnosis not present

## 2019-06-03 DIAGNOSIS — I110 Hypertensive heart disease with heart failure: Secondary | ICD-10-CM | POA: Insufficient documentation

## 2019-06-03 DIAGNOSIS — Y92018 Other place in single-family (private) house as the place of occurrence of the external cause: Secondary | ICD-10-CM | POA: Insufficient documentation

## 2019-06-03 DIAGNOSIS — R404 Transient alteration of awareness: Secondary | ICD-10-CM | POA: Diagnosis not present

## 2019-06-03 DIAGNOSIS — H5340 Unspecified visual field defects: Secondary | ICD-10-CM | POA: Diagnosis not present

## 2019-06-03 DIAGNOSIS — E785 Hyperlipidemia, unspecified: Secondary | ICD-10-CM | POA: Diagnosis present

## 2019-06-03 DIAGNOSIS — R42 Dizziness and giddiness: Secondary | ICD-10-CM | POA: Diagnosis not present

## 2019-06-03 DIAGNOSIS — R29702 NIHSS score 2: Secondary | ICD-10-CM | POA: Diagnosis not present

## 2019-06-03 DIAGNOSIS — C44721 Squamous cell carcinoma of skin of unspecified lower limb, including hip: Secondary | ICD-10-CM | POA: Diagnosis not present

## 2019-06-03 DIAGNOSIS — I6523 Occlusion and stenosis of bilateral carotid arteries: Secondary | ICD-10-CM | POA: Diagnosis not present

## 2019-06-03 DIAGNOSIS — R0602 Shortness of breath: Secondary | ICD-10-CM | POA: Diagnosis not present

## 2019-06-03 DIAGNOSIS — F0280 Dementia in other diseases classified elsewhere without behavioral disturbance: Secondary | ICD-10-CM | POA: Diagnosis present

## 2019-06-03 LAB — COMPREHENSIVE METABOLIC PANEL
ALT: 10 U/L (ref 0–44)
AST: 28 U/L (ref 15–41)
Albumin: 4.3 g/dL (ref 3.5–5.0)
Alkaline Phosphatase: 65 U/L (ref 38–126)
Anion gap: 9 (ref 5–15)
BUN: 11 mg/dL (ref 8–23)
CO2: 28 mmol/L (ref 22–32)
Calcium: 9.1 mg/dL (ref 8.9–10.3)
Chloride: 98 mmol/L (ref 98–111)
Creatinine, Ser: 0.88 mg/dL (ref 0.44–1.00)
GFR calc Af Amer: >60 mL/min (ref >60)
GFR calc non Af Amer: 59 mL/min — ABNORMAL LOW (ref >60)
Glucose, Bld: 103 mg/dL — ABNORMAL HIGH (ref 70–99)
Potassium: 3.3 mmol/L — ABNORMAL LOW (ref 3.5–5.1)
Sodium: 135 mmol/L (ref 135–145)
Total Bilirubin: 0.5 mg/dL (ref 0.3–1.2)
Total Protein: 7.6 g/dL (ref 6.5–8.1)

## 2019-06-03 LAB — CBC WITH DIFFERENTIAL/PLATELET
Abs Immature Granulocytes: 0.05 10*3/uL (ref 0.00–0.07)
Basophils Absolute: 0.1 10*3/uL (ref 0.0–0.1)
Basophils Relative: 1 %
Eosinophils Absolute: 0.1 10*3/uL (ref 0.0–0.5)
Eosinophils Relative: 1 %
HCT: 41.1 % (ref 36.0–46.0)
Hemoglobin: 13.8 g/dL (ref 12.0–15.0)
Immature Granulocytes: 1 %
Lymphocytes Relative: 29 %
Lymphs Abs: 2.9 10*3/uL (ref 0.7–4.0)
MCH: 32.4 pg (ref 26.0–34.0)
MCHC: 33.6 g/dL (ref 30.0–36.0)
MCV: 96.5 fL (ref 80.0–100.0)
Monocytes Absolute: 0.7 10*3/uL (ref 0.1–1.0)
Monocytes Relative: 7 %
Neutro Abs: 6.1 10*3/uL (ref 1.7–7.7)
Neutrophils Relative %: 61 %
Platelets: 182 10*3/uL (ref 150–400)
RBC: 4.26 MIL/uL (ref 3.87–5.11)
RDW: 13.5 % (ref 11.5–15.5)
WBC: 10.1 10*3/uL (ref 4.0–10.5)
nRBC: 0 % (ref 0.0–0.2)

## 2019-06-03 LAB — CBG MONITORING, ED: Glucose-Capillary: 90 mg/dL (ref 70–99)

## 2019-06-03 LAB — URINALYSIS, ROUTINE W REFLEX MICROSCOPIC
Bacteria, UA: NONE SEEN
Bilirubin Urine: NEGATIVE
Glucose, UA: NEGATIVE mg/dL
Hgb urine dipstick: NEGATIVE
Ketones, ur: NEGATIVE mg/dL
Nitrite: NEGATIVE
Protein, ur: NEGATIVE mg/dL
Specific Gravity, Urine: 1.006 (ref 1.005–1.030)
pH: 8 (ref 5.0–8.0)

## 2019-06-03 LAB — TROPONIN I (HIGH SENSITIVITY): Troponin I (High Sensitivity): 17 ng/L (ref <18)

## 2019-06-03 LAB — PROTIME-INR
INR: 1 (ref 0.8–1.2)
Prothrombin Time: 13.3 seconds (ref 11.4–15.2)

## 2019-06-03 MED ORDER — TETANUS-DIPHTH-ACELL PERTUSSIS 5-2.5-18.5 LF-MCG/0.5 IM SUSP
0.5000 mL | Freq: Once | INTRAMUSCULAR | Status: AC
  Administered 2019-06-03: 0.5 mL via INTRAMUSCULAR
  Filled 2019-06-03: qty 0.5

## 2019-06-03 MED ORDER — LABETALOL HCL 5 MG/ML IV SOLN
10.0000 mg | Freq: Once | INTRAVENOUS | Status: AC
  Administered 2019-06-03: 10 mg via INTRAVENOUS
  Filled 2019-06-03: qty 4

## 2019-06-03 MED ORDER — RIVAROXABAN 15 MG PO TABS
15.0000 mg | ORAL_TABLET | Freq: Once | ORAL | Status: AC
  Administered 2019-06-04: 15 mg via ORAL
  Filled 2019-06-03 (×2): qty 1

## 2019-06-03 NOTE — ED Notes (Signed)
Theora Gianotti please update when possible @ [phone]

## 2019-06-03 NOTE — ED Notes (Signed)
[place] pts son

## 2019-06-03 NOTE — Social Work (Addendum)
EDCSW called [employer] non-emergency for a wellcheck to to discern if PTs son is at home in order to create a plan for a safe discharge. 10:26 PM   CSW and TOCRN met with PT at bedside. PT was pleasant and affable. Pt appeared somewhat confused.  At 10:40 pm CSW received call from [facility] Department stating that they had made contact with [name] and that he is at work and that he would be at the hospital between 8 and 9 am on Saturday 1/9.21 to pick up Pt.

## 2019-06-03 NOTE — ED Provider Notes (Signed)
[facility] EMERGENCY DEPARTMENT Provider Note   CSN: [id number] Arrival date & time: 06/03/19  1912     History Chief Complaint  Patient presents with  . Fall    Tiffany Charles is a 86 y.o. female.  HPI Tiffany Charles is a 86 y.o. female with a medical history of afib, on xarelto, copd, cad, htn, dementia who presents to the ED for a fall. She arrives by EMS from home after her neighbors checked on her this evening and found her sitting on the floor in her house. She states felt dizzy and fell down on the floor. She denies any pain or injuries at this time. She has a small skin tear to her left forearm. She states felt lightheaded prior to her syncopal event. She denies any recent illness, shortness of breath, fever.      Past Medical History:  Diagnosis Date  . Angina pectoris   . Arthritis   . Chronic anticoagulation    Xarelto  . Chronic diastolic (congestive) heart failure ([place])   . COPD (chronic obstructive pulmonary disease) ([place])   . Coronary artery disease    a. s/p RCA stent 2008. b. cath 02/2009: 30% mLAD, 90% AV groove cx unchanged from prior, 50-60% RCA, EF 60% -> med rx.  . Depression   . Essential hypertension   . Hyperlipidemia   . Mild pulmonary hypertension ([place]) 09/17/2016  . Permanent atrial fibrillation ([place])   . Skin cancer Jan 2013   squamous cell- removed  . Stented coronary artery 2009   RCA stent  . Tricuspid regurgitation 09/17/2016    Patient Active Problem List   Diagnosis Date Noted  . Squamous cell carcinoma, leg, unspecified laterality 06/08/2018  . Basal cell carcinoma (BCC) of nasal tip 06/08/2018  . Senile dementia without behavioral disturbance ([place]) 04/29/2017  . Frequent PVCs 09/17/2016  . Tricuspid regurgitation 09/17/2016  . Mild pulmonary hypertension ([place]) 09/17/2016  . Chronic diastolic (congestive) heart failure ([place])   . Essential hypertension   . Generalized OA 12/28/2014  . Routine general medical  examination at a health care facility 11/09/2013  . Depression, major ([place]) 11/07/2013  . Chronic anticoagulation- Xarelto 12/28/2012  . Permanent atrial fibrillation 10/28/2012  . COPD - PFTs in June 2014 with MET test 10/28/2012  . CAD, RCA BMS '09, 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 low risk Myoview Feb 2013 10/28/2012  . Hyperlipidemia 10/28/2012    Past Surgical History:  Procedure Laterality Date  .  CATARACTS REMOVED    . ABDOMINAL HYSTERECTOMY    . CARDIAC CATHETERIZATION  03/22/2009   Started medical therapy  . CARDIOVERSION  06/05/2009   3 attempts, last attempt successful  . CORONARY ANGIOPLASTY WITH STENT PLACEMENT  2009   RCA Vision stent  . HIP SURGERY    . MELANOMA EXCISION  06/06/2011   Procedure: MELANOMA EXCISION;  Surgeon: Adin Hector, MD;  Location: [facility]S;  Service: General;  Laterality: Left;  re excision squamous cell lesion left chest wall      OB History   No obstetric history on file.     Family History  Problem Relation Age of Onset  . Stroke Mother   . Heart disease Mother   . Coronary artery disease Father   . Heart disease Father   . Stroke Sister   . Heart disease Brother   . Stroke Sister   . Stroke Sister     Social History   Tobacco Use  .  Smoking status: Former Smoker    Packs/Tavian Callander: 0.50  . Smokeless tobacco: Never Used  Substance Use Topics  . Alcohol use: No  . Drug use: No    Home Medications Prior to Admission medications   Medication Sig Start Date End Date Taking? Authorizing Provider  metoprolol succinate (TOPROL-XL) 25 MG 24 hr tablet Take 1 tablet (25 mg total) by mouth daily. 05/05/19  Yes Croitoru, Mihai, MD  Rivaroxaban (XARELTO) 15 MG TABS tablet Take 1 tablet (15 mg total) by mouth daily before supper. 05/05/19  Yes Croitoru, Mihai, MD  pravastatin (PRAVACHOL) 40 MG tablet Take 1 tablet (40 mg total) by mouth at bedtime. Patient not taking: Reported on 06/03/2019 05/05/19   Croitoru, Dani Gobble, MD     Allergies    Codeine  Review of Systems   Review of Systems  Constitutional: Negative for chills and fever.  HENT: Negative for ear pain and sore throat.   Eyes: Negative for pain and visual disturbance.  Respiratory: Negative for cough and shortness of breath.   Cardiovascular: Negative for chest pain and palpitations.  Gastrointestinal: Negative for abdominal pain and vomiting.  Genitourinary: Negative for dysuria and hematuria.  Musculoskeletal: Negative for arthralgias and back pain.  Skin: Positive for wound (Small skin tear to left forearm). Negative for color change and rash.  Neurological: Positive for syncope. Negative for seizures.  All other systems reviewed and are negative.   Physical Exam Updated Vital Signs BP (!) 177/103   Pulse (!) 108   Temp 98.2 F (36.8 C) (Oral)   Resp 19   SpO2 94%   Physical Exam Vitals and nursing note reviewed.  Constitutional:      General: She is not in acute distress.    Appearance: Normal appearance. She is well-developed. She is not ill-appearing.     Comments: Female who appears stated age, pleasantly confused, no signs of head trauma, midline c spine is non tender, moving all extremities equally with normal strength and sensation, no deformities and all extremities are non tender  HENT:     Head: Normocephalic and atraumatic.     Right Ear: External ear normal.     Left Ear: External ear normal.     Nose: Nose normal. No rhinorrhea.     Mouth/Throat:     Mouth: Mucous membranes are moist.  Eyes:     General:        Right eye: No discharge.        Left eye: No discharge.     Conjunctiva/sclera: Conjunctivae normal.  Cardiovascular:     Rate and Rhythm: Normal rate and regular rhythm.     Pulses: Normal pulses.     Heart sounds: Normal heart sounds. No murmur.  Pulmonary:     Effort: Pulmonary effort is normal. No respiratory distress.     Breath sounds: Normal breath sounds. No wheezing or rales.  Abdominal:      General: Abdomen is flat. There is no distension.     Palpations: Abdomen is soft.     Tenderness: There is no abdominal tenderness.  Musculoskeletal:        General: No deformity or signs of injury. Normal range of motion.     Cervical back: Normal range of motion and neck supple.  Skin:    General: Skin is warm and dry.     Capillary Refill: Capillary refill takes less than 2 seconds.     Coloration: Skin is not jaundiced.     Comments: Small  skin tear to left forearm  Neurological:     General: No focal deficit present.     Mental Status: She is alert. Mental status is at baseline.  Psychiatric:        Mood and Affect: Mood normal.        Behavior: Behavior normal.     ED Results / Procedures / Treatments   Labs (all labs ordered are listed, but only abnormal results are displayed) Labs Reviewed  COMPREHENSIVE METABOLIC PANEL - Abnormal; Notable for the following components:      Result Value   Potassium 3.3 (*)    Glucose, Bld 103 (*)    GFR calc non Af Amer 59 (*)    All other components within normal limits  URINALYSIS, ROUTINE W REFLEX MICROSCOPIC - Abnormal; Notable for the following components:   Leukocytes,Ua SMALL (*)    All other components within normal limits  CBC WITH DIFFERENTIAL/PLATELET  PROTIME-INR  CBG MONITORING, ED  TROPONIN I (HIGH SENSITIVITY)    EKG EKG Interpretation  Date/Time:  Friday June 03 2019 19:25:45 EST Ventricular Rate:  101 PR Interval:    QRS Duration: 136 QT Interval:  331 QTC Calculation: 429 R Axis:   68 Text Interpretation: Atrial fibrillation LVH with secondary repolarization abnormality ST depression, consider ischemia, diffuse lds deep inverted t waves in the anterior leads, has had st depression in similar leads on prior Otherwise no significant change Confirmed by Deno Etienne [phone]) on 06/03/2019 7:32:27 PM   Radiology DG Chest 2 View  Result Date: 06/03/2019 CLINICAL DATA:  Syncope EXAM: CHEST - 2 VIEW COMPARISON:   September 16, 2016 FINDINGS: There is mild cardiomegaly. Stable mildly increased interstitial markings seen at the right lung base. No large airspace consolidation or pleural effusion. No acute osseous abnormality. IMPRESSION: No active cardiopulmonary disease.  Mild cardiomegaly. Electronically Signed   By: Prudencio Pair M.D.   On: 06/03/2019 20:20   DG Forearm Left  Result Date: 06/03/2019 CLINICAL DATA:  Recent fall with left forearm pain, initial encounter EXAM: LEFT FOREARM - 2 VIEW COMPARISON:  None. FINDINGS: Degenerative changes at the first [facility] joint are noted. No acute fracture or dislocation is noted. No soft tissue changes are seen. IMPRESSION: No acute abnormality noted. Electronically Signed   By: Inez Catalina M.D.   On: 06/03/2019 20:23   CT HEAD WO CONTRAST  Result Date: 06/03/2019 CLINICAL DATA:  Fall EXAM: CT HEAD WITHOUT CONTRAST CT CERVICAL SPINE WITHOUT CONTRAST TECHNIQUE: Multidetector CT imaging of the head and cervical spine was performed following the standard protocol without intravenous contrast. Multiplanar CT image reconstructions of the cervical spine were also generated. COMPARISON:  None. FINDINGS: CT HEAD FINDINGS Brain: There is no acute intracranial hemorrhage, mass-effect, or edema. Gray-white differentiation is preserved. There is no extra-axial fluid collection. Prominence of the ventricles and sulci reflects mild generalized parenchymal volume loss. Patchy and mildly confluent areas of hypoattenuation in the supratentorial white matter are nonspecific but may reflect mild to moderate chronic microvascular ischemic changes. Area of relative hypoattenuation of right cerebellum is probably artifactual. Vascular: There is atherosclerotic calcification at the skull base. Skull: Calvarium is unremarkable. Sinuses/Orbits: No acute finding. Other: None. CT CERVICAL SPINE FINDINGS Alignment: No significant listhesis. Skull base and vertebrae: There is no acute fracture. Vertebral body  heights are maintained. Soft tissues and spinal canal: No prevertebral fluid or swelling. No visible canal hematoma. Disc levels: Multilevel degenerative changes are present including disc space narrowing, endplate osteophytes, and facet and  uncovertebral hypertrophy. There is no significant osseous encroachment on the spinal canal. Foraminal stenosis is greatest at C5-C6 and C6-C7. Upper chest: Included lung apices are clear. Other: None. IMPRESSION: No evidence of acute intracranial injury. Chronic microvascular ischemic changes. No acute cervical spine fracture. Electronically Signed   By: Macy Mis M.D.   On: 06/03/2019 20:06   CT CERVICAL SPINE WO CONTRAST  Result Date: 06/03/2019 CLINICAL DATA:  Fall EXAM: CT HEAD WITHOUT CONTRAST CT CERVICAL SPINE WITHOUT CONTRAST TECHNIQUE: Multidetector CT imaging of the head and cervical spine was performed following the standard protocol without intravenous contrast. Multiplanar CT image reconstructions of the cervical spine were also generated. COMPARISON:  None. FINDINGS: CT HEAD FINDINGS Brain: There is no acute intracranial hemorrhage, mass-effect, or edema. Gray-white differentiation is preserved. There is no extra-axial fluid collection. Prominence of the ventricles and sulci reflects mild generalized parenchymal volume loss. Patchy and mildly confluent areas of hypoattenuation in the supratentorial white matter are nonspecific but may reflect mild to moderate chronic microvascular ischemic changes. Area of relative hypoattenuation of right cerebellum is probably artifactual. Vascular: There is atherosclerotic calcification at the skull base. Skull: Calvarium is unremarkable. Sinuses/Orbits: No acute finding. Other: None. CT CERVICAL SPINE FINDINGS Alignment: No significant listhesis. Skull base and vertebrae: There is no acute fracture. Vertebral body heights are maintained. Soft tissues and spinal canal: No prevertebral fluid or swelling. No visible canal  hematoma. Disc levels: Multilevel degenerative changes are present including disc space narrowing, endplate osteophytes, and facet and uncovertebral hypertrophy. There is no significant osseous encroachment on the spinal canal. Foraminal stenosis is greatest at C5-C6 and C6-C7. Upper chest: Included lung apices are clear. Other: None. IMPRESSION: No evidence of acute intracranial injury. Chronic microvascular ischemic changes. No acute cervical spine fracture. Electronically Signed   By: Macy Mis M.D.   On: 06/03/2019 20:06    Procedures Procedures (including critical care time)  Medications Ordered in ED Medications  Rivaroxaban (XARELTO) tablet 15 mg (has no administration in time range)  Tdap (BOOSTRIX) injection 0.5 mL (has no administration in time range)  labetalol (NORMODYNE) injection 10 mg (10 mg Intravenous Given 06/03/19 2302)    ED Course  I have reviewed the triage vital signs and the nursing notes.  Pertinent labs & imaging results that were available during my care of the patient were reviewed by me and considered in my medical decision making (see chart for details).    MDM Rules/Calculators/A&P                      Tiffany Charles is a 86 y.o. female with a medical history of afib, on xarelto, copd, cad, htn, dementia who presents to the ED for a fall. She arrives by EMS from home after her neighbors checked on her this evening and found her sitting on the floor in her house. She states felt dizzy and fell down on the floor. She denies any pain or injuries at this time. She has a small skin tear to her left forearm. She states felt lightheaded prior to her syncopal event. She denies any recent illness, shortness of breath, fever.  HPI and physical exam as above. Female who appears stated age, pleasantly confused, no signs of head trauma, midline c spine is non tender, moving all extremities equally with normal strength and sensation, no deformities and all extremities are  non tender, hemodynamically stable, afebrile, non toxic. C collar in place. Small skin tear to left forearm.  Ct imaging of her head and c spine is unremarkable. Xray imaging of her chest and left forearm are unremarkable. Her laboratory work is reassuring. Screening troponin unremarkable, cbc and metabolic panel are unremarkable. She is medically clear. Low suspicion for ich or traumatic injury. Tdap updated. Her son checks on her at home for safety but he is at work until the morning. Social work assisted in finding this information. We will plan to board her in the er for tonight, her son plans to take her home in the morning.      Final Clinical Impression(s) / ED Diagnoses Final diagnoses:  None    Rx / DC Orders ED Discharge Orders    None       Zyrell Carmean, Lovena Le, MD 06/03/19 [place], [place], DO 06/06/19 [phone]

## 2019-06-03 NOTE — Care Management (Signed)
Patient may benefit from [facility] services,discussed with patient she is agreeable. CM explained that [facility] agency services are limited and with her  permission I will send referral to several of the preferred [facility] agency patient is agreeable.  Orders placed and sent via [facility] to [facility], [facility], [facility], and [facility], information placed on AVS. Updated Dr. Tyrone Nine and Wilburn Cornelia Nurse

## 2019-06-03 NOTE — ED Notes (Signed)
Patient transported to CT 

## 2019-06-03 NOTE — ED Notes (Signed)
Patient transported to X-ray 

## 2019-06-03 NOTE — ED Triage Notes (Signed)
At arrived from home via ems where she was found on the floor by neighbors. Pt stated that she fell in kitchen then "passed out" for an unknown amount of time. EMS was called at approx 6pm today. Pt presents with a skin tear on left forearm and without any pain. Pt stated she did hit her had on fall. Per EMS pt on zerelto but did not take today.

## 2019-06-04 NOTE — ED Triage Notes (Signed)
TC to Pt Nephew to ask who will pick Pt up to go home. Nephew reported he will come as soon as the black ice melts.

## 2019-06-04 NOTE — ED Notes (Signed)
Patient verbalizes understanding of discharge instructions . Opportunity for questions and answers were provided . Armband removed by staff ,Pt discharged from ED. W/C  offered at D/C  and Declined W/C at D/C and was escorted to lobby by RN.  

## 2019-06-04 NOTE — ED Triage Notes (Signed)
Family arrived to take Pt home

## 2019-06-05 ENCOUNTER — Emergency Department (HOSPITAL_COMMUNITY)
Admission: EM | Admit: 2019-06-05 | Discharge: 2019-06-05 | Disposition: A | Discharge status: Home / Self Care | Payer: Medicare HMO | Source: Home / Self Care | Attending: Emergency Medicine | Admitting: Emergency Medicine

## 2019-06-05 ENCOUNTER — Other Ambulatory Visit: Payer: Self-pay

## 2019-06-05 ENCOUNTER — Emergency Department (HOSPITAL_COMMUNITY): Payer: Medicare HMO

## 2019-06-05 ENCOUNTER — Encounter (HOSPITAL_COMMUNITY): Payer: Self-pay | Admitting: Emergency Medicine

## 2019-06-05 DIAGNOSIS — Z7901 Long term (current) use of anticoagulants: Secondary | ICD-10-CM | POA: Insufficient documentation

## 2019-06-05 DIAGNOSIS — R531 Weakness: Secondary | ICD-10-CM

## 2019-06-05 DIAGNOSIS — Z79899 Other long term (current) drug therapy: Secondary | ICD-10-CM | POA: Insufficient documentation

## 2019-06-05 DIAGNOSIS — Z85828 Personal history of other malignant neoplasm of skin: Secondary | ICD-10-CM | POA: Insufficient documentation

## 2019-06-05 DIAGNOSIS — I25119 Atherosclerotic heart disease of native coronary artery with unspecified angina pectoris: Secondary | ICD-10-CM | POA: Insufficient documentation

## 2019-06-05 DIAGNOSIS — Z20822 Contact with and (suspected) exposure to covid-19: Secondary | ICD-10-CM | POA: Insufficient documentation

## 2019-06-05 DIAGNOSIS — I5032 Chronic diastolic (congestive) heart failure: Secondary | ICD-10-CM | POA: Insufficient documentation

## 2019-06-05 DIAGNOSIS — J449 Chronic obstructive pulmonary disease, unspecified: Secondary | ICD-10-CM | POA: Insufficient documentation

## 2019-06-05 DIAGNOSIS — R0602 Shortness of breath: Secondary | ICD-10-CM | POA: Insufficient documentation

## 2019-06-05 DIAGNOSIS — I110 Hypertensive heart disease with heart failure: Secondary | ICD-10-CM | POA: Insufficient documentation

## 2019-06-05 DIAGNOSIS — Z87891 Personal history of nicotine dependence: Secondary | ICD-10-CM | POA: Insufficient documentation

## 2019-06-05 LAB — TROPONIN I (HIGH SENSITIVITY)
Troponin I (High Sensitivity): 11 ng/L (ref <18)
Troponin I (High Sensitivity): 12 ng/L (ref <18)

## 2019-06-05 LAB — CBC WITH DIFFERENTIAL/PLATELET
Abs Immature Granulocytes: 0.01 10*3/uL (ref 0.00–0.07)
Basophils Absolute: 0.1 10*3/uL (ref 0.0–0.1)
Basophils Relative: 1 %
Eosinophils Absolute: 0.2 10*3/uL (ref 0.0–0.5)
Eosinophils Relative: 2 %
HCT: 38.6 % (ref 36.0–46.0)
Hemoglobin: 12.6 g/dL (ref 12.0–15.0)
Immature Granulocytes: 0 %
Lymphocytes Relative: 33 %
Lymphs Abs: 2.8 10*3/uL (ref 0.7–4.0)
MCH: 32.5 pg (ref 26.0–34.0)
MCHC: 32.6 g/dL (ref 30.0–36.0)
MCV: 99.5 fL (ref 80.0–100.0)
Monocytes Absolute: 0.7 10*3/uL (ref 0.1–1.0)
Monocytes Relative: 8 %
Neutro Abs: 4.9 10*3/uL (ref 1.7–7.7)
Neutrophils Relative %: 56 %
Platelets: 176 10*3/uL (ref 150–400)
RBC: 3.88 MIL/uL (ref 3.87–5.11)
RDW: 13.8 % (ref 11.5–15.5)
WBC: 8.7 10*3/uL (ref 4.0–10.5)
nRBC: 0 % (ref 0.0–0.2)

## 2019-06-05 LAB — BASIC METABOLIC PANEL
Anion gap: 8 (ref 5–15)
BUN: 18 mg/dL (ref 8–23)
CO2: 30 mmol/L (ref 22–32)
Calcium: 9 mg/dL (ref 8.9–10.3)
Chloride: 104 mmol/L (ref 98–111)
Creatinine, Ser: 0.87 mg/dL (ref 0.44–1.00)
GFR calc Af Amer: >60 mL/min (ref >60)
GFR calc non Af Amer: 60 mL/min — ABNORMAL LOW (ref >60)
Glucose, Bld: 103 mg/dL — ABNORMAL HIGH (ref 70–99)
Potassium: 3.5 mmol/L (ref 3.5–5.1)
Sodium: 142 mmol/L (ref 135–145)

## 2019-06-05 LAB — D-DIMER, QUANTITATIVE: D-Dimer, Quant: 0.68 ug/mL-FEU — ABNORMAL HIGH (ref 0.00–0.50)

## 2019-06-05 LAB — RESPIRATORY PANEL BY RT PCR (FLU A&B, COVID)
Influenza A by PCR: NEGATIVE
Influenza B by PCR: NEGATIVE
SARS Coronavirus 2 by RT PCR: NEGATIVE

## 2019-06-05 LAB — BRAIN NATRIURETIC PEPTIDE: B Natriuretic Peptide: 194 pg/mL — ABNORMAL HIGH (ref 0.0–100.0)

## 2019-06-05 MED ORDER — SODIUM CHLORIDE 0.9 % IV BOLUS
500.0000 mL | Freq: Once | INTRAVENOUS | Status: AC
  Administered 2019-06-05: 500 mL via INTRAVENOUS

## 2019-06-05 MED ORDER — NICOTINE 14 MG/24HR TD PT24: 14.0000 mg | MEDICATED_PATCH | Freq: Every day | TRANSDERMAL | 0 refills | Status: DC

## 2019-06-05 NOTE — ED Notes (Signed)
Family reports Salena Saner is on the way to provide patient transport home.

## 2019-06-05 NOTE — ED Notes (Signed)
ED Provider at bedside. 

## 2019-06-05 NOTE — ED Notes (Signed)
Patient ambulatory to and from bathroom with standby assistance. MD made aware.

## 2019-06-05 NOTE — ED Notes (Signed)
XR at bedside

## 2019-06-05 NOTE — ED Provider Notes (Signed)
[facility] DEPT Provider Note   CSN: [id number] Arrival date & time: 06/05/19  1251     History Chief Complaint  Patient presents with  . Weakness    Tiffany Charles is a 86 y.o. female.  Patient is an 86 year old female with a history of atrial fibrillation on Xarelto, CHF, COPD, CAD, ongoing tobacco abuse presenting today for acute onset weakness, shortness of breath and some chest pain.  Patient was seen at [facility] on 06/03/2019 after being found on the floor with report of dizziness.  Patient had labs and imaging done which were benign and because of an issue with getting a ride home she did not leaves till yesterday morning at 10 AM.  Speaking with her son who gives most of the history he states when she was discharged the IV was still on her arm and they had someone take it out yesterday afternoon.  She seemed to be okay but this morning she was sitting in a chair and he states she was having a difficult time getting up.  She tried multiple times and was not able to stand on her own.  He then was able to help her over to the couch.  Patient reports that she was feeling short of breath this morning and even had a little chest pain after the shortness of breath started.  She currently states the shortness of breath is gone and she feels better.  She is not had fever, cough, abdominal pain, nausea or vomiting.  She did eat breakfast this morning and denies feeling hungry.  She denies any unilateral weakness or numbness in her son agrees with this.    The history is provided by the patient and a relative.  Weakness Severity:  Moderate Onset quality:  Sudden Timing:  Constant Progression:  Resolved Associated symptoms: chest pain and shortness of breath   Associated symptoms: no abdominal pain, no dizziness, no fever, no foul-smelling urine, no frequency, no loss of consciousness, no stroke symptoms, no vision change and no vomiting   Risk factors: coronary artery  disease   Risk factors comment:  On going tobacco abuse      Past Medical History:  Diagnosis Date  . Angina pectoris   . Arthritis   . Chronic anticoagulation    Xarelto  . Chronic diastolic (congestive) heart failure ([place])   . COPD (chronic obstructive pulmonary disease) ([place])   . Coronary artery disease    a. s/p RCA stent 2008. b. cath 02/2009: 30% mLAD, 90% AV groove cx unchanged from prior, 50-60% RCA, EF 60% -> med rx.  . Depression   . Essential hypertension   . Hyperlipidemia   . Mild pulmonary hypertension ([place]) 09/17/2016  . Permanent atrial fibrillation ([place])   . Skin cancer Jan 2013   squamous cell- removed  . Stented coronary artery 2009   RCA stent  . Tricuspid regurgitation 09/17/2016    Patient Active Problem List   Diagnosis Date Noted  . Squamous cell carcinoma, leg, unspecified laterality 06/08/2018  . Basal cell carcinoma (BCC) of nasal tip 06/08/2018  . Senile dementia without behavioral disturbance ([place]) 04/29/2017  . Frequent PVCs 09/17/2016  . Tricuspid regurgitation 09/17/2016  . Mild pulmonary hypertension ([place]) 09/17/2016  . Chronic diastolic (congestive) heart failure ([place])   . Essential hypertension   . Generalized OA 12/28/2014  . Routine general medical examination at a health care facility 11/09/2013  . Depression, major ([place]) 11/07/2013  . Chronic anticoagulation- Xarelto 12/28/2012  .  Permanent atrial fibrillation 10/28/2012  . COPD - PFTs in June 2014 with MET test 10/28/2012  . CAD, RCA BMS '09, 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 low risk Myoview Feb 2013 10/28/2012  . Hyperlipidemia 10/28/2012    Past Surgical History:  Procedure Laterality Date  .  CATARACTS REMOVED    . ABDOMINAL HYSTERECTOMY    . CARDIAC CATHETERIZATION  03/22/2009   Started medical therapy  . CARDIOVERSION  06/05/2009   3 attempts, last attempt successful  . CORONARY ANGIOPLASTY WITH STENT PLACEMENT  2009   RCA Vision stent  . HIP SURGERY    .  MELANOMA EXCISION  06/06/2011   Procedure: MELANOMA EXCISION;  Surgeon: Adin Hector, MD;  Location: [facility] ORS;  Service: General;  Laterality: Left;  re excision squamous cell lesion left chest wall      OB History   No obstetric history on file.     Family History  Problem Relation Age of Onset  . Stroke Mother   . Heart disease Mother   . Coronary artery disease Father   . Heart disease Father   . Stroke Sister   . Heart disease Brother   . Stroke Sister   . Stroke Sister     Social History   Tobacco Use  . Smoking status: Former Smoker    Packs/day: 0.50  . Smokeless tobacco: Never Used  Substance Use Topics  . Alcohol use: No  . Drug use: No    Home Medications Prior to Admission medications   Medication Sig Start Date End Date Taking? Authorizing Provider  metoprolol succinate (TOPROL-XL) 25 MG 24 hr tablet Take 1 tablet (25 mg total) by mouth daily. 05/05/19   Croitoru, Mihai, MD  pravastatin (PRAVACHOL) 40 MG tablet Take 1 tablet (40 mg total) by mouth at bedtime. Patient not taking: Reported on 06/03/2019 05/05/19   Croitoru, Dani Gobble, MD  Rivaroxaban (XARELTO) 15 MG TABS tablet Take 1 tablet (15 mg total) by mouth daily before supper. 05/05/19   Croitoru, Mihai, MD    Allergies    Codeine  Review of Systems   Review of Systems  Constitutional: Negative for fever.  Respiratory: Positive for shortness of breath.   Cardiovascular: Positive for chest pain.  Gastrointestinal: Negative for abdominal pain and vomiting.  Genitourinary: Negative for frequency.  Neurological: Positive for weakness. Negative for dizziness and loss of consciousness.  All other systems reviewed and are negative.   Physical Exam Updated Vital Signs BP 136/74   Pulse 84   Temp 97.7 F (36.5 C) (Oral)   Resp 19   SpO2 98%   Physical Exam Vitals and nursing note reviewed.  Constitutional:      General: She is not in acute distress.    Appearance: Normal appearance. She is  well-developed and normal weight.  HENT:     Head: Normocephalic and atraumatic.     Nose: Nose normal.     Mouth/Throat:     Mouth: Mucous membranes are moist.  Eyes:     Pupils: Pupils are equal, round, and reactive to light.  Cardiovascular:     Rate and Rhythm: Normal rate and regular rhythm.     Heart sounds: Murmur present. No friction rub.  Pulmonary:     Effort: Pulmonary effort is normal.     Breath sounds: Examination of the right-lower field reveals rales. Examination of the left-lower field reveals rales. Rales present. No wheezing.  Abdominal:     General: Bowel sounds are normal.  There is no distension.     Palpations: Abdomen is soft.     Tenderness: There is no abdominal tenderness. There is no guarding or rebound.  Musculoskeletal:        General: No tenderness. Normal range of motion.     Comments: No edema  Skin:    General: Skin is warm and dry.     Findings: No rash.  Neurological:     General: No focal deficit present.     Mental Status: She is alert. Mental status is at baseline.     Cranial Nerves: No cranial nerve deficit.     Comments: Oriented to person and place.  5/5 strength in upper and lower ext with normal sensation.  No pronator drift or facial droop  Psychiatric:        Mood and Affect: Mood normal.        Behavior: Behavior normal.        Thought Content: Thought content normal.     ED Results / Procedures / Treatments   Labs (all labs ordered are listed, but only abnormal results are displayed) Labs Reviewed  D-DIMER, QUANTITATIVE (NOT AT [facility]) - Abnormal; Notable for the following components:      Result Value   D-Dimer, Quant 0.68 (*)    All other components within normal limits  BASIC METABOLIC PANEL - Abnormal; Notable for the following components:   Glucose, Bld 103 (*)    GFR calc non Af Amer 60 (*)    All other components within normal limits  BRAIN NATRIURETIC PEPTIDE - Abnormal; Notable for the following components:   B  Natriuretic Peptide 194.0 (*)    All other components within normal limits  RESPIRATORY PANEL BY RT PCR (FLU A&B, COVID)  CBC WITH DIFFERENTIAL/PLATELET  TROPONIN I (HIGH SENSITIVITY)  TROPONIN I (HIGH SENSITIVITY)    EKG EKG Interpretation  Date/Time:  Sunday June 05 2019 13:10:15 EST Ventricular Rate:  80 PR Interval:    QRS Duration: 84 QT Interval:  419 QTC Calculation: 484 R Axis:   36 Text Interpretation: Atrial fibrillation Anteroseptal infarct, old Repol abnrm suggests ischemia, anterolateral No significant change since last tracing Confirmed by Blanchie Dessert [phone]) on 06/05/2019 1:16:48 PM   Radiology DG Chest 2 View  Result Date: 06/03/2019 CLINICAL DATA:  Syncope EXAM: CHEST - 2 VIEW COMPARISON:  September 16, 2016 FINDINGS: There is mild cardiomegaly. Stable mildly increased interstitial markings seen at the right lung base. No large airspace consolidation or pleural effusion. No acute osseous abnormality. IMPRESSION: No active cardiopulmonary disease.  Mild cardiomegaly. Electronically Signed   By: Prudencio Pair M.D.   On: 06/03/2019 20:20   DG Forearm Left  Result Date: 06/03/2019 CLINICAL DATA:  Recent fall with left forearm pain, initial encounter EXAM: LEFT FOREARM - 2 VIEW COMPARISON:  None. FINDINGS: Degenerative changes at the first [facility] joint are noted. No acute fracture or dislocation is noted. No soft tissue changes are seen. IMPRESSION: No acute abnormality noted. Electronically Signed   By: Inez Catalina M.D.   On: 06/03/2019 20:23   CT HEAD WO CONTRAST  Result Date: 06/03/2019 CLINICAL DATA:  Fall EXAM: CT HEAD WITHOUT CONTRAST CT CERVICAL SPINE WITHOUT CONTRAST TECHNIQUE: Multidetector CT imaging of the head and cervical spine was performed following the standard protocol without intravenous contrast. Multiplanar CT image reconstructions of the cervical spine were also generated. COMPARISON:  None. FINDINGS: CT HEAD FINDINGS Brain: There is no acute  intracranial hemorrhage, mass-effect, or edema. Gray-white  differentiation is preserved. There is no extra-axial fluid collection. Prominence of the ventricles and sulci reflects mild generalized parenchymal volume loss. Patchy and mildly confluent areas of hypoattenuation in the supratentorial white matter are nonspecific but may reflect mild to moderate chronic microvascular ischemic changes. Area of relative hypoattenuation of right cerebellum is probably artifactual. Vascular: There is atherosclerotic calcification at the skull base. Skull: Calvarium is unremarkable. Sinuses/Orbits: No acute finding. Other: None. CT CERVICAL SPINE FINDINGS Alignment: No significant listhesis. Skull base and vertebrae: There is no acute fracture. Vertebral body heights are maintained. Soft tissues and spinal canal: No prevertebral fluid or swelling. No visible canal hematoma. Disc levels: Multilevel degenerative changes are present including disc space narrowing, endplate osteophytes, and facet and uncovertebral hypertrophy. There is no significant osseous encroachment on the spinal canal. Foraminal stenosis is greatest at C5-C6 and C6-C7. Upper chest: Included lung apices are clear. Other: None. IMPRESSION: No evidence of acute intracranial injury. Chronic microvascular ischemic changes. No acute cervical spine fracture. Electronically Signed   By: Macy Mis M.D.   On: 06/03/2019 20:06   CT CERVICAL SPINE WO CONTRAST  Result Date: 06/03/2019 CLINICAL DATA:  Fall EXAM: CT HEAD WITHOUT CONTRAST CT CERVICAL SPINE WITHOUT CONTRAST TECHNIQUE: Multidetector CT imaging of the head and cervical spine was performed following the standard protocol without intravenous contrast. Multiplanar CT image reconstructions of the cervical spine were also generated. COMPARISON:  None. FINDINGS: CT HEAD FINDINGS Brain: There is no acute intracranial hemorrhage, mass-effect, or edema. Gray-white differentiation is preserved. There is no  extra-axial fluid collection. Prominence of the ventricles and sulci reflects mild generalized parenchymal volume loss. Patchy and mildly confluent areas of hypoattenuation in the supratentorial white matter are nonspecific but may reflect mild to moderate chronic microvascular ischemic changes. Area of relative hypoattenuation of right cerebellum is probably artifactual. Vascular: There is atherosclerotic calcification at the skull base. Skull: Calvarium is unremarkable. Sinuses/Orbits: No acute finding. Other: None. CT CERVICAL SPINE FINDINGS Alignment: No significant listhesis. Skull base and vertebrae: There is no acute fracture. Vertebral body heights are maintained. Soft tissues and spinal canal: No prevertebral fluid or swelling. No visible canal hematoma. Disc levels: Multilevel degenerative changes are present including disc space narrowing, endplate osteophytes, and facet and uncovertebral hypertrophy. There is no significant osseous encroachment on the spinal canal. Foraminal stenosis is greatest at C5-C6 and C6-C7. Upper chest: Included lung apices are clear. Other: None. IMPRESSION: No evidence of acute intracranial injury. Chronic microvascular ischemic changes. No acute cervical spine fracture. Electronically Signed   By: Macy Mis M.D.   On: 06/03/2019 20:06    Procedures Procedures (including critical care time)  Medications Ordered in ED Medications - No data to display  ED Course  I have reviewed the triage vital signs and the nursing notes.  Pertinent labs & imaging results that were available during my care of the patient were reviewed by me and considered in my medical decision making (see chart for details).    MDM Rules/Calculators/A&P                     Patient is an 86 year old female with multiple medical problems ongoing tobacco abuse presenting today with an episode of weakness but patient also reports she was feeling short of breath and had some mild chest pain.   Patient was seen 2 days ago after a potential syncopal event.  She had normal labs at the time and negative imaging after her fall and was discharged home.  She did seem to be okay yesterday when she got home but then this morning was having difficulty standing from a chair.  No focal deficits or evidence of stroke at this time however concern for possible Covid versus PE versus ACS as the source of her shortness of breath, chest pain and weakness.  She has no weakness at this time.  She denies any cough, abdominal pain, nausea vomiting or fever. EKG with A. fib but no other acute changes.  Labs pending.  5:22 PM Patient's delta troponin without acute findings.  Low suspicion for ACS at this time.  BNP is improved from her prior 94.  Chest x-ray without acute findings.  D-dimer is 0.68 but age-adjusted is within normal limits.  CBC and BMP with no acute findings.  Covid is negative.  Patient's urine from yesterday without signs of infection.  Patient was able to get out of bed on her own and ambulate down the hallway with minimal to no assistance.  At this time she does not meet criteria for admission.  Findings were discussed with her son [name].  We did discuss her stopping smoking and she was given nicotine patches to help facilitate that.  Otherwise she appears safe for discharge at this time.  Final Clinical Impression(s) / ED Diagnoses Final diagnoses:  Weakness    Rx / DC Orders ED Discharge Orders         Ordered    nicotine (NICODERM CQ - DOSED IN MG/24 HOURS) 14 mg/24hr patch  Daily     06/05/19 1725           Blanchie Dessert, MD 06/05/19 1725

## 2019-06-05 NOTE — ED Notes (Signed)
Patient's grandson provided with update, per patient request.

## 2019-06-05 NOTE — ED Triage Notes (Addendum)
Per EMS, patient from home, c/o generalized weakness x3 days. Seen at [facility] for same and d/c x2 days ago. Denies N/V/D, cough, fever, chest pain, SOB, urinary sx. Ambulatory with assistance. Hx afib and dementia. A&Ox3 at baseline.  20g L AC

## 2019-06-05 NOTE — Discharge Instructions (Signed)
Today all your labs were reassuring.  There was no sign of heart attack, blood clot in the lungs, pneumonia, urinary tract infection or congestive heart failure.  Oxygen levels were normal.  It is very important for you to stop smoking so that you can feel better.  You can use the nicotine patch to help with cravings.

## 2019-06-06 ENCOUNTER — Telehealth: Payer: Self-pay | Admitting: Cardiovascular Disease

## 2019-06-06 ENCOUNTER — Inpatient Hospital Stay (HOSPITAL_COMMUNITY)
Admission: EM | Admit: 2019-06-06 | Discharge: 2019-06-08 | DRG: 065 | Disposition: A | Discharge status: Home Health | Payer: Medicare HMO | Attending: Internal Medicine | Admitting: Internal Medicine

## 2019-06-06 ENCOUNTER — Other Ambulatory Visit: Payer: Self-pay

## 2019-06-06 ENCOUNTER — Encounter (HOSPITAL_COMMUNITY): Payer: Self-pay | Admitting: Emergency Medicine

## 2019-06-06 DIAGNOSIS — Z85820 Personal history of malignant melanoma of skin: Secondary | ICD-10-CM

## 2019-06-06 DIAGNOSIS — Z66 Do not resuscitate: Secondary | ICD-10-CM | POA: Diagnosis present

## 2019-06-06 DIAGNOSIS — H5340 Unspecified visual field defects: Secondary | ICD-10-CM | POA: Diagnosis present

## 2019-06-06 DIAGNOSIS — M1990 Unspecified osteoarthritis, unspecified site: Secondary | ICD-10-CM | POA: Diagnosis present

## 2019-06-06 DIAGNOSIS — F0280 Dementia in other diseases classified elsewhere without behavioral disturbance: Secondary | ICD-10-CM | POA: Diagnosis present

## 2019-06-06 DIAGNOSIS — R29702 NIHSS score 2: Secondary | ICD-10-CM | POA: Diagnosis present

## 2019-06-06 DIAGNOSIS — F0390 Unspecified dementia without behavioral disturbance: Secondary | ICD-10-CM | POA: Diagnosis present

## 2019-06-06 DIAGNOSIS — I071 Rheumatic tricuspid insufficiency: Secondary | ICD-10-CM | POA: Diagnosis present

## 2019-06-06 DIAGNOSIS — Z87891 Personal history of nicotine dependence: Secondary | ICD-10-CM

## 2019-06-06 DIAGNOSIS — Z885 Allergy status to narcotic agent status: Secondary | ICD-10-CM

## 2019-06-06 DIAGNOSIS — I2510 Atherosclerotic heart disease of native coronary artery without angina pectoris: Secondary | ICD-10-CM | POA: Diagnosis present

## 2019-06-06 DIAGNOSIS — Z7901 Long term (current) use of anticoagulants: Secondary | ICD-10-CM

## 2019-06-06 DIAGNOSIS — Y92009 Unspecified place in unspecified non-institutional (private) residence as the place of occurrence of the external cause: Secondary | ICD-10-CM

## 2019-06-06 DIAGNOSIS — I4821 Permanent atrial fibrillation: Secondary | ICD-10-CM | POA: Diagnosis present

## 2019-06-06 DIAGNOSIS — W1830XA Fall on same level, unspecified, initial encounter: Secondary | ICD-10-CM | POA: Diagnosis present

## 2019-06-06 DIAGNOSIS — T45516A Underdosing of anticoagulants, initial encounter: Secondary | ICD-10-CM | POA: Diagnosis present

## 2019-06-06 DIAGNOSIS — Z955 Presence of coronary angioplasty implant and graft: Secondary | ICD-10-CM

## 2019-06-06 DIAGNOSIS — I639 Cerebral infarction, unspecified: Secondary | ICD-10-CM | POA: Diagnosis present

## 2019-06-06 DIAGNOSIS — S51812A Laceration without foreign body of left forearm, initial encounter: Secondary | ICD-10-CM | POA: Diagnosis present

## 2019-06-06 DIAGNOSIS — I10 Essential (primary) hypertension: Secondary | ICD-10-CM | POA: Diagnosis present

## 2019-06-06 DIAGNOSIS — Z79899 Other long term (current) drug therapy: Secondary | ICD-10-CM

## 2019-06-06 DIAGNOSIS — Z823 Family history of stroke: Secondary | ICD-10-CM

## 2019-06-06 DIAGNOSIS — J449 Chronic obstructive pulmonary disease, unspecified: Secondary | ICD-10-CM | POA: Diagnosis present

## 2019-06-06 DIAGNOSIS — Z20822 Contact with and (suspected) exposure to covid-19: Secondary | ICD-10-CM | POA: Diagnosis present

## 2019-06-06 DIAGNOSIS — Z8249 Family history of ischemic heart disease and other diseases of the circulatory system: Secondary | ICD-10-CM

## 2019-06-06 DIAGNOSIS — E785 Hyperlipidemia, unspecified: Secondary | ICD-10-CM | POA: Diagnosis present

## 2019-06-06 DIAGNOSIS — I5032 Chronic diastolic (congestive) heart failure: Secondary | ICD-10-CM | POA: Diagnosis present

## 2019-06-06 DIAGNOSIS — G309 Alzheimer's disease, unspecified: Secondary | ICD-10-CM | POA: Diagnosis present

## 2019-06-06 DIAGNOSIS — I110 Hypertensive heart disease with heart failure: Secondary | ICD-10-CM | POA: Diagnosis present

## 2019-06-06 DIAGNOSIS — Z9114 Patient's other noncompliance with medication regimen: Secondary | ICD-10-CM

## 2019-06-06 DIAGNOSIS — R4189 Other symptoms and signs involving cognitive functions and awareness: Secondary | ICD-10-CM | POA: Diagnosis present

## 2019-06-06 DIAGNOSIS — I6340 Cerebral infarction due to embolism of unspecified cerebral artery: Principal | ICD-10-CM | POA: Diagnosis present

## 2019-06-06 LAB — COMPREHENSIVE METABOLIC PANEL
ALT: 10 U/L (ref 0–44)
AST: 28 U/L (ref 15–41)
Albumin: 3.9 g/dL (ref 3.5–5.0)
Alkaline Phosphatase: 59 U/L (ref 38–126)
Anion gap: 9 (ref 5–15)
BUN: 19 mg/dL (ref 8–23)
CO2: 27 mmol/L (ref 22–32)
Calcium: 9.4 mg/dL (ref 8.9–10.3)
Chloride: 106 mmol/L (ref 98–111)
Creatinine, Ser: 1.18 mg/dL — ABNORMAL HIGH (ref 0.44–1.00)
GFR calc Af Amer: 48 mL/min — ABNORMAL LOW (ref >60)
GFR calc non Af Amer: 41 mL/min — ABNORMAL LOW (ref >60)
Glucose, Bld: 103 mg/dL — ABNORMAL HIGH (ref 70–99)
Potassium: 3.9 mmol/L (ref 3.5–5.1)
Sodium: 142 mmol/L (ref 135–145)
Total Bilirubin: 0.7 mg/dL (ref 0.3–1.2)
Total Protein: 6.5 g/dL (ref 6.5–8.1)

## 2019-06-06 LAB — CBC WITH DIFFERENTIAL/PLATELET
Abs Immature Granulocytes: 0.03 10*3/uL (ref 0.00–0.07)
Basophils Absolute: 0.1 10*3/uL (ref 0.0–0.1)
Basophils Relative: 1 %
Eosinophils Absolute: 0.2 10*3/uL (ref 0.0–0.5)
Eosinophils Relative: 2 %
HCT: 39.6 % (ref 36.0–46.0)
Hemoglobin: 13 g/dL (ref 12.0–15.0)
Immature Granulocytes: 0 %
Lymphocytes Relative: 39 %
Lymphs Abs: 3.4 10*3/uL (ref 0.7–4.0)
MCH: 32.4 pg (ref 26.0–34.0)
MCHC: 32.8 g/dL (ref 30.0–36.0)
MCV: 98.8 fL (ref 80.0–100.0)
Monocytes Absolute: 0.8 10*3/uL (ref 0.1–1.0)
Monocytes Relative: 9 %
Neutro Abs: 4.3 10*3/uL (ref 1.7–7.7)
Neutrophils Relative %: 49 %
Platelets: 192 10*3/uL (ref 150–400)
RBC: 4.01 MIL/uL (ref 3.87–5.11)
RDW: 13.7 % (ref 11.5–15.5)
WBC: 8.8 10*3/uL (ref 4.0–10.5)
nRBC: 0 % (ref 0.0–0.2)

## 2019-06-06 LAB — CBG MONITORING, ED: Glucose-Capillary: 98 mg/dL (ref 70–99)

## 2019-06-06 NOTE — ED Triage Notes (Signed)
Patient's son reported generalized weakness for several days , patient is alert but demented /confused / disoriented , poor historian , son is unable to be with patient due to his pending surgery . She denies pain / respirations unlabored.

## 2019-06-06 NOTE — Telephone Encounter (Signed)
I looked over her emergency room visits including labs and x-rays from the 8th and the 10th and I really do not see any clear answers to what the problem might be.  No sign of a heart attack or congestive heart failure.  ECG is chronically abnormal, but unchanged from previous tracings.

## 2019-06-06 NOTE — ED Notes (Signed)
This tech brought PT to triage, and patient was unable to tell this tech why she was at the hospital. This tech called son to see if he was able to come in and wait with patient. Son states he was unable to come inside and wait with her at this time. This tech spoke with son on the phone, he states, patient stated "she felt she was real dizzy, couldn't stand, has been real weak, and has felt like she is going to pass out." Son states patient has been seen for same, 3 times in the past four days. Son asked for someone to call and give him updates and provided home phone number he would like to be reached on [phone].

## 2019-06-06 NOTE — ED Notes (Signed)
[phone] son [place] please call for updates he is in parking lot

## 2019-06-06 NOTE — Social Work (Signed)
EDCSW and TOCRN spoke with PTs son via phone. Margorie John stated that PT had become dizzy at home and reported that she felt as though she was going to pass out.  Son stated that he drove PT to hospital and dropped her off.  Son reported that had not yet received confirmation from home health as to when home services would begin.

## 2019-06-06 NOTE — Telephone Encounter (Signed)
  Son is calling because the patient has been to the ED on 06/03/19 and 06/05/19 for weakness, SOB and some chest pain. Son would like Dr Sallyanne Kuster to look over the notes and he would like to know what Dr Sallyanne Kuster thinks.

## 2019-06-07 ENCOUNTER — Emergency Department (HOSPITAL_COMMUNITY): Payer: Medicare HMO

## 2019-06-07 ENCOUNTER — Encounter (HOSPITAL_COMMUNITY): Payer: Self-pay | Admitting: Internal Medicine

## 2019-06-07 ENCOUNTER — Inpatient Hospital Stay (HOSPITAL_COMMUNITY): Payer: Medicare HMO

## 2019-06-07 DIAGNOSIS — R29702 NIHSS score 2: Secondary | ICD-10-CM | POA: Diagnosis present

## 2019-06-07 DIAGNOSIS — I6340 Cerebral infarction due to embolism of unspecified cerebral artery: Secondary | ICD-10-CM | POA: Diagnosis present

## 2019-06-07 DIAGNOSIS — I071 Rheumatic tricuspid insufficiency: Secondary | ICD-10-CM | POA: Diagnosis present

## 2019-06-07 DIAGNOSIS — I4891 Unspecified atrial fibrillation: Secondary | ICD-10-CM | POA: Diagnosis not present

## 2019-06-07 DIAGNOSIS — I4821 Permanent atrial fibrillation: Secondary | ICD-10-CM | POA: Diagnosis present

## 2019-06-07 DIAGNOSIS — Z23 Encounter for immunization: Secondary | ICD-10-CM | POA: Diagnosis present

## 2019-06-07 DIAGNOSIS — I361 Nonrheumatic tricuspid (valve) insufficiency: Secondary | ICD-10-CM

## 2019-06-07 DIAGNOSIS — E785 Hyperlipidemia, unspecified: Secondary | ICD-10-CM | POA: Diagnosis present

## 2019-06-07 DIAGNOSIS — F0280 Dementia in other diseases classified elsewhere without behavioral disturbance: Secondary | ICD-10-CM | POA: Diagnosis present

## 2019-06-07 DIAGNOSIS — T45516A Underdosing of anticoagulants, initial encounter: Secondary | ICD-10-CM | POA: Diagnosis present

## 2019-06-07 DIAGNOSIS — I10 Essential (primary) hypertension: Secondary | ICD-10-CM

## 2019-06-07 DIAGNOSIS — I360 Nonrheumatic tricuspid (valve) stenosis: Secondary | ICD-10-CM | POA: Diagnosis not present

## 2019-06-07 DIAGNOSIS — Z66 Do not resuscitate: Secondary | ICD-10-CM | POA: Diagnosis present

## 2019-06-07 DIAGNOSIS — H5340 Unspecified visual field defects: Secondary | ICD-10-CM | POA: Diagnosis present

## 2019-06-07 DIAGNOSIS — Z85820 Personal history of malignant melanoma of skin: Secondary | ICD-10-CM | POA: Diagnosis not present

## 2019-06-07 DIAGNOSIS — Z7901 Long term (current) use of anticoagulants: Secondary | ICD-10-CM | POA: Diagnosis not present

## 2019-06-07 DIAGNOSIS — G309 Alzheimer's disease, unspecified: Secondary | ICD-10-CM | POA: Diagnosis present

## 2019-06-07 DIAGNOSIS — Z955 Presence of coronary angioplasty implant and graft: Secondary | ICD-10-CM | POA: Diagnosis not present

## 2019-06-07 DIAGNOSIS — Z79899 Other long term (current) drug therapy: Secondary | ICD-10-CM | POA: Diagnosis not present

## 2019-06-07 DIAGNOSIS — R4189 Other symptoms and signs involving cognitive functions and awareness: Secondary | ICD-10-CM | POA: Diagnosis present

## 2019-06-07 DIAGNOSIS — Z87891 Personal history of nicotine dependence: Secondary | ICD-10-CM | POA: Diagnosis not present

## 2019-06-07 DIAGNOSIS — J449 Chronic obstructive pulmonary disease, unspecified: Secondary | ICD-10-CM | POA: Diagnosis present

## 2019-06-07 DIAGNOSIS — I639 Cerebral infarction, unspecified: Secondary | ICD-10-CM | POA: Diagnosis present

## 2019-06-07 DIAGNOSIS — M1990 Unspecified osteoarthritis, unspecified site: Secondary | ICD-10-CM | POA: Diagnosis present

## 2019-06-07 DIAGNOSIS — S51812A Laceration without foreign body of left forearm, initial encounter: Secondary | ICD-10-CM | POA: Diagnosis present

## 2019-06-07 DIAGNOSIS — Y92009 Unspecified place in unspecified non-institutional (private) residence as the place of occurrence of the external cause: Secondary | ICD-10-CM | POA: Diagnosis not present

## 2019-06-07 DIAGNOSIS — W1830XA Fall on same level, unspecified, initial encounter: Secondary | ICD-10-CM | POA: Diagnosis present

## 2019-06-07 DIAGNOSIS — Z20822 Contact with and (suspected) exposure to covid-19: Secondary | ICD-10-CM | POA: Diagnosis present

## 2019-06-07 DIAGNOSIS — I2510 Atherosclerotic heart disease of native coronary artery without angina pectoris: Secondary | ICD-10-CM | POA: Diagnosis present

## 2019-06-07 DIAGNOSIS — I110 Hypertensive heart disease with heart failure: Secondary | ICD-10-CM | POA: Diagnosis present

## 2019-06-07 DIAGNOSIS — Z9114 Patient's other noncompliance with medication regimen: Secondary | ICD-10-CM | POA: Diagnosis not present

## 2019-06-07 DIAGNOSIS — I5032 Chronic diastolic (congestive) heart failure: Secondary | ICD-10-CM | POA: Diagnosis present

## 2019-06-07 LAB — TROPONIN I (HIGH SENSITIVITY): Troponin I (High Sensitivity): 13 ng/L (ref <18)

## 2019-06-07 LAB — SARS CORONAVIRUS 2 (TAT 6-24 HRS): SARS Coronavirus 2: NEGATIVE

## 2019-06-07 MED ORDER — STROKE: EARLY STAGES OF RECOVERY BOOK
Freq: Once | Status: AC
  Filled 2019-06-07: qty 1

## 2019-06-07 MED ORDER — IOHEXOL 350 MG/ML SOLN
75.0000 mL | Freq: Once | INTRAVENOUS | Status: AC
  Administered 2019-06-07: 75 mL via INTRAVENOUS

## 2019-06-07 MED ORDER — ATORVASTATIN CALCIUM 40 MG PO TABS
40.0000 mg | ORAL_TABLET | Freq: Every day | ORAL | Status: DC
  Administered 2019-06-07: 40 mg via ORAL
  Filled 2019-06-07: qty 1

## 2019-06-07 MED ORDER — ACETAMINOPHEN 650 MG RE SUPP: 650.0000 mg | RECTAL | Status: DC | PRN

## 2019-06-07 MED ORDER — NICOTINE 14 MG/24HR TD PT24
14.0000 mg | MEDICATED_PATCH | Freq: Every day | TRANSDERMAL | Status: DC
  Administered 2019-06-08: 14 mg via TRANSDERMAL
  Filled 2019-06-07: qty 1

## 2019-06-07 MED ORDER — RIVAROXABAN 15 MG PO TABS
15.0000 mg | ORAL_TABLET | Freq: Every day | ORAL | Status: DC
  Administered 2019-06-07: 15 mg via ORAL
  Filled 2019-06-07 (×2): qty 1

## 2019-06-07 MED ORDER — HALOPERIDOL LACTATE 5 MG/ML IJ SOLN
2.0000 mg | Freq: Four times a day (QID) | INTRAMUSCULAR | Status: DC | PRN
  Administered 2019-06-07 – 2019-06-08 (×2): 2 mg via INTRAVENOUS
  Filled 2019-06-07 (×3): qty 1

## 2019-06-07 MED ORDER — SENNOSIDES-DOCUSATE SODIUM 8.6-50 MG PO TABS: 1.0000 | ORAL_TABLET | Freq: Every evening | ORAL | Status: DC | PRN

## 2019-06-07 MED ORDER — ACETAMINOPHEN 325 MG PO TABS: 650.0000 mg | ORAL_TABLET | ORAL | Status: DC | PRN

## 2019-06-07 MED ORDER — ACETAMINOPHEN 160 MG/5ML PO SOLN: 650.0000 mg | ORAL | Status: DC | PRN

## 2019-06-07 MED ORDER — SODIUM CHLORIDE 0.9 % IV SOLN: INTRAVENOUS | Status: DC

## 2019-06-07 MED ORDER — SODIUM CHLORIDE 0.9 % IV BOLUS
500.0000 mL | Freq: Once | INTRAVENOUS | Status: AC
  Administered 2019-06-07: 500 mL via INTRAVENOUS

## 2019-06-07 NOTE — H&P (Signed)
History and Physical    DIETRICH KE Charles:[id number] DOB: 08/27/1929 DOA: 06/06/2019  PCP: [name], MD Consultants:  Croitoru - cardiology Patient coming from:  Home - lives with son; NOK: Tiffany, Charles, [phone]; [phone] - he is caring for both her and her 84yo sister   Chief Complaint: generalized weakness  HPI: Tiffany Charles is a 86 y.o. female with medical history significant of CAD s/p stent; afib; HTN; HLD; depression; dementia; COPD; and chronic diastolic CHF presenting with generalized weakness.  "I just wanted to see the doctor."  She is oriented only to person.  I called and spoke with her son.  He reports that in the last week or so she has been weak with standing, hard to get up.  She reported feeling fine.  She has Alzheimer's and is about the same as usual.  She is on afib and takes Xarelto - she has missed some doses, probably a couple of months worse.  He has had her back on the last 2 weeks and has missed a few doses.  She also has missed her cholesterol medication.  She continues to smoke.  He would not consider SNF rehab but would consider CIR.   ED Course:  Frequent ER visits - unsteady with falls.  Son was concerned that she had stroke.  MRI ordered and confirms several small occipital cortical infarcts.  Neurology Dr. Cheral Marker recommends admission for CVA evaluation.  Review of Systems: Unable to perform   Ambulatory Status:  Ambulates without assistance  Past Medical History:  Diagnosis Date  . Angina pectoris   . Arthritis   . Chronic anticoagulation    Xarelto  . Chronic diastolic (congestive) heart failure ([place])   . COPD (chronic obstructive pulmonary disease) ([place])   . Coronary artery disease    a. s/p RCA stent 2008. b. cath 02/2009: 30% mLAD, 90% AV groove cx unchanged from prior, 50-60% RCA, EF 60% -> med rx.  . Depression   . Essential hypertension   . Hyperlipidemia   . Mild pulmonary hypertension ([place]) 09/17/2016  . Permanent  atrial fibrillation ([place])   . Skin cancer Jan 2013   squamous cell- removed  . Tricuspid regurgitation 09/17/2016    Past Surgical History:  Procedure Laterality Date  .  CATARACTS REMOVED    . ABDOMINAL HYSTERECTOMY    . CARDIAC CATHETERIZATION  03/22/2009   Started medical therapy  . CARDIOVERSION  06/05/2009   3 attempts, last attempt successful  . CORONARY ANGIOPLASTY WITH STENT PLACEMENT  2009   RCA Vision stent  . HIP SURGERY    . MELANOMA EXCISION  06/06/2011   Procedure: MELANOMA EXCISION;  Surgeon: Adin Hector, MD;  Location: [facility];  Service: General;  Laterality: Left;  re excision squamous cell lesion left chest wall     Social History   Socioeconomic History  . Marital status: Widowed    Spouse name: Not on file  . Number of children: 2  . Years of education: Not on file  . Highest education level: Not on file  Occupational History  . Occupation: Retired  Tobacco Use  . Smoking status: Former Smoker    Packs/day: 0.50  . Smokeless tobacco: Never Used  Substance and Sexual Activity  . Alcohol use: No  . Drug use: No  . Sexual activity: Never  Other Topics Concern  . Not on file  Social History Narrative   Pt lives alone, son is nearby.   Social Determinants  of Health   Financial Resource Strain: Low Risk   . Difficulty of Paying Living Expenses: Not hard at all  Food Insecurity: No Food Insecurity  . Worried About Charity fundraiser in the Last Year: Never true  . Ran Out of Food in the Last Year: Never true  Transportation Needs: No Transportation Needs  . Lack of Transportation (Medical): No  . Lack of Transportation (Non-Medical): No  Physical Activity: Inactive  . Days of Exercise per Week: 0 days  . Minutes of Exercise per Session: 0 min  Stress: No Stress Concern Present  . Feeling of Stress : Only a little  Social Connections: Slightly Isolated  . Frequency of Communication with Friends and Family: More than three times a week  .  Frequency of Social Gatherings with Friends and Family: More than three times a week  . Attends Religious Services: More than 4 times per year  . Active Member of Clubs or Organizations: Yes  . Attends Archivist Meetings: More than 4 times per year  . Marital Status: Widowed  Intimate Partner Violence:   . Fear of Current or Ex-Partner: Not on file  . Emotionally Abused: Not on file  . Physically Abused: Not on file  . Sexually Abused: Not on file    Allergies  Allergen Reactions  . Codeine Nausea And Vomiting    Family History  Problem Relation Age of Onset  . Stroke Mother   . Heart disease Mother   . Coronary artery disease Father   . Heart disease Father   . Stroke Sister   . Heart disease Brother   . Stroke Sister   . Stroke Sister     Prior to Admission medications   Medication Sig Start Date End Date Taking? Authorizing Provider  metoprolol succinate (TOPROL-XL) 25 MG 24 hr tablet Take 1 tablet (25 mg total) by mouth daily. 05/05/19   Croitoru, Mihai, MD  nicotine (NICODERM CQ - DOSED IN MG/24 HOURS) 14 mg/24hr patch Place 1 patch (14 mg total) onto the skin daily. 06/05/19   Blanchie Dessert, MD  pravastatin (PRAVACHOL) 40 MG tablet Take 1 tablet (40 mg total) by mouth at bedtime. Patient not taking: Reported on 06/03/2019 05/05/19   Croitoru, Dani Gobble, MD  Rivaroxaban (XARELTO) 15 MG TABS tablet Take 1 tablet (15 mg total) by mouth daily before supper. 05/05/19   Croitoru, Dani Gobble, MD    Physical Exam: Vitals:   06/07/19 1015 06/07/19 1030 06/07/19 1045 06/07/19 1100  BP: (!) 159/90 (!) 158/92 (!) 168/85 (!) 171/94  Pulse: 78 86    Resp: 19 15 17  (!) 21  Temp:      TempSrc:      SpO2: 98% 100%       . General:  Appears calm and comfortable and is NAD, very pleasant . Eyes:  PERRL, EOMI, normal lids, iris . ENT:  grossly normal hearing, lips & tongue, mmm . Neck:  no LAD, masses or thyromegaly . Cardiovascular:  Irregularly irregular, rate controlled,  no m/r/g. No LE edema.  Marland Kitchen Respiratory:   CTA bilaterally with no wheezes/rales/rhonchi.  Normal respiratory effort. . Abdomen:  soft, NT, ND, NABS . Back:   normal alignment, no CVAT . Skin:  no rash or induration seen on limited exam . Musculoskeletal:  grossly normal tone BUE/BLE, good ROM, no bony abnormality - difficult to fully assess . Psychiatric:  grossly normal mood and affect, pleasantly demented, speech slowed but appropriate, AOx1 . Neurologic:  CN  2-12 grossly intact, moves all extremities in coordinated fashion, sensation intact - difficult to fully assess    Radiological Exams on Admission: MR BRAIN WO CONTRAST  Result Date: 06/07/2019 CLINICAL DATA:  Stroke.  Ataxia. EXAM: MRI HEAD WITHOUT CONTRAST TECHNIQUE: Multiplanar, multiecho pulse sequences of the brain and surrounding structures were obtained without intravenous contrast. COMPARISON:  CT head 06/03/2019 FINDINGS: Brain: Several small acute infarcts in the right occipital lobe. These appear to be cortical infarct. Otherwise no acute infarct. Generalized atrophy. Mild chronic ischemic changes in the white matter. Negative for hemorrhage mass or hydrocephalus. Vascular: Normal arterial flow voids. Skull and upper cervical spine: Negative Sinuses/Orbits: Paranasal sinuses clear.  Bilateral cataract surgery Other: None IMPRESSION: Several small cortical infarct right occipital lobe. Atrophy and chronic microvascular ischemia. Electronically Signed   By: Franchot Gallo M.D.   On: 06/07/2019 10:09   DG Chest Port 1 View  Result Date: 06/05/2019 CLINICAL DATA:  Shortness of breath EXAM: PORTABLE CHEST 1 VIEW COMPARISON:  June 03, 2019 FINDINGS: Lungs are clear. Heart is mildly enlarged, stable, with pulmonary vascularity normal. No adenopathy. There is aortic atherosclerosis. No bone lesions. IMPRESSION: Stable cardiac prominence. Lungs clear. Aortic Atherosclerosis (ICD10-I70.0). Electronically Signed   By: Lowella Grip III  M.D.   On: 06/05/2019 14:11    EKG: Independently reviewed.  Afib with rate 74; nonspecific ST changes with no evidence of acute ischemia   Labs on Admission: I have personally reviewed the available labs and imaging studies at the time of the admission.  Pertinent labs:   BUN 19/Creatinine 1.18/GFR 41 Normal CBC COVID pending   Assessment/Plan Principal Problem:   Occipital stroke [facility]) Active Problems:   Permanent atrial fibrillation   COPD - PFTs in June 2014 with MET test   Hyperlipidemia   Chronic anticoagulation- Xarelto   Chronic diastolic (congestive) heart failure (HCC)   Essential hypertension   Senile dementia without behavioral disturbance (HCC)    CVA -Patient with underlying weakness presenting with weakness/falls on 1/8, 1/10, and yesterday -Son was frustrated and reported concern for CVA and so MRI was ordered - and positive for acute occipital CVA -Her baseline cognition does not appear to be impaired by CVA but apparently her strength and balance are impacted -Will admit for further CVA evaluation -Telemetry monitoring -Will do CTA in an effort to avoid repeat MR -Echo -Risk stratification with FLP -This event appears to have been associated with noncompliance with Xarelto; ongoing efforts at compliance will be important -Neurology consult -PT/OT/ST/Nutrition Consults -Family would consider CIR placement, but not SNF Rehab  HTN -Allow permissive HTN for now -Treat BP only if >220/120, and then with goal of 15% reduction -Hold BB - although she apparently takes this irregularly   HLD -Check FLP -Resume statin but will change Pravachol to Lipitor 40 mg daily -She has reportedly been inconsistent with taking this medication regularly too   Afib -Rate controlled despite irregular use of Toprol -Also irregular use of Xarelto - this needs to be taken regularly  COPD -She does not appear to be taking medication for this at this time -Smoking  cessation should be encouraged  Dementia -Her baseline cognition is significantly impaired and this is not obviously impacted by her current CVA  Tobacco dependence -Encourage cessation -Nicotine patch (re)ordered     Note: This patient has been tested and is pending for the novel coronavirus COVID-19.    DVT prophylaxis: Xarelto Code Status: DNR - confirmed with family Family Communication: None present;  I spoke with her son by telephone Disposition Plan: To be determined Consults called: Neurology; PT/OT/ST/Nutrition Admission status: Admit - It is my clinical opinion that admission to INPATIENT is reasonable and necessary because of the expectation that this patient will require hospital care that crosses at least 2 midnights to treat this condition based on the medical complexity of the problems presented.  Given the aforementioned information, the predictability of an adverse outcome is felt to be significant.    Karmen Bongo MD [facility]   How to contact the [facility] Attending or Consulting provider [place] or covering provider during after hours [place], for this patient?  1. Check the care team in [facility] and look for a) attending/consulting TR[facility] provider listed and b) the [facility] team listed 2. Log into [email or web address] and use [place]'s universal password to access. If you do not have the password, please contact the hospital operator. 3. Locate the [facility] provider you are looking for under [facility] and page to a number that you can be directly reached. 4. If you still have difficulty reaching the provider, please page the [facility] (Director on Call) for the Hospitalists listed on amion for assistance.   06/07/2019, 11:52 AM

## 2019-06-07 NOTE — ED Provider Notes (Signed)
[facility] EMERGENCY DEPARTMENT Provider Note   CSN: [id number] Arrival date & time: 06/06/19  1832     History Chief Complaint  Patient presents with  . Gen. Weakness    Tiffany Charles is a 86 y.o. female.  Patient is a an 86 year old female with past medical history of coronary artery disease with stent atrial fibrillation on Xarelto, arthritis, pulmonary hypertension, and hypertension.  She presents today for the third time in 4 days for evaluation of weakness and gait instability.  According to the son, the patient has been unsteady on her feet and fell several days ago.  She underwent imaging studies and has had laboratory studies, but no cause has been found for her fall or dizziness, and no injury identified from the fall.  Patient has dementia and adds very little additional history.  The history is provided by the patient.       Past Medical History:  Diagnosis Date  . Angina pectoris   . Arthritis   . Chronic anticoagulation    Xarelto  . Chronic diastolic (congestive) heart failure ([place])   . COPD (chronic obstructive pulmonary disease) ([place])   . Coronary artery disease    a. s/p RCA stent 2008. b. cath 02/2009: 30% mLAD, 90% AV groove cx unchanged from prior, 50-60% RCA, EF 60% -> med rx.  . Depression   . Essential hypertension   . Hyperlipidemia   . Mild pulmonary hypertension ([place]) 09/17/2016  . Permanent atrial fibrillation ([place])   . Skin cancer Jan 2013   squamous cell- removed  . Stented coronary artery 2009   RCA stent  . Tricuspid regurgitation 09/17/2016    Patient Active Problem List   Diagnosis Date Noted  . Squamous cell carcinoma, leg, unspecified laterality 06/08/2018  . Basal cell carcinoma (BCC) of nasal tip 06/08/2018  . Senile dementia without behavioral disturbance ([place]) 04/29/2017  . Frequent PVCs 09/17/2016  . Tricuspid regurgitation 09/17/2016  . Mild pulmonary hypertension ([place]) 09/17/2016  . Chronic diastolic  (congestive) heart failure ([place])   . Essential hypertension   . Generalized OA 12/28/2014  . Routine general medical examination at a health care facility 11/09/2013  . Depression, major ([place]) 11/07/2013  . Chronic anticoagulation- Xarelto 12/28/2012  . Permanent atrial fibrillation 10/28/2012  . COPD - PFTs in June 2014 with MET test 10/28/2012  . CAD, RCA BMS '09, 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 low risk Myoview Feb 2013 10/28/2012  . Hyperlipidemia 10/28/2012    Past Surgical History:  Procedure Laterality Date  .  CATARACTS REMOVED    . ABDOMINAL HYSTERECTOMY    . CARDIAC CATHETERIZATION  03/22/2009   Started medical therapy  . CARDIOVERSION  06/05/2009   3 attempts, last attempt successful  . CORONARY ANGIOPLASTY WITH STENT PLACEMENT  2009   RCA Vision stent  . HIP SURGERY    . MELANOMA EXCISION  06/06/2011   Procedure: MELANOMA EXCISION;  Surgeon: Adin Hector, MD;  Location: [facility];  Service: General;  Laterality: Left;  re excision squamous cell lesion left chest wall      OB History   No obstetric history on file.     Family History  Problem Relation Age of Onset  . Stroke Mother   . Heart disease Mother   . Coronary artery disease Father   . Heart disease Father   . Stroke Sister   . Heart disease Brother   . Stroke Sister   . Stroke Sister  Social History   Tobacco Use  . Smoking status: Former Smoker    Packs/day: 0.50  . Smokeless tobacco: Never Used  Substance Use Topics  . Alcohol use: No  . Drug use: No    Home Medications Prior to Admission medications   Medication Sig Start Date End Date Taking? Authorizing Provider  metoprolol succinate (TOPROL-XL) 25 MG 24 hr tablet Take 1 tablet (25 mg total) by mouth daily. 05/05/19   Croitoru, Mihai, MD  nicotine (NICODERM CQ - DOSED IN MG/24 HOURS) 14 mg/24hr patch Place 1 patch (14 mg total) onto the skin daily. 06/05/19   Blanchie Dessert, MD  pravastatin (PRAVACHOL) 40 MG tablet  Take 1 tablet (40 mg total) by mouth at bedtime. Patient not taking: Reported on 06/03/2019 05/05/19   Croitoru, Dani Gobble, MD  Rivaroxaban (XARELTO) 15 MG TABS tablet Take 1 tablet (15 mg total) by mouth daily before supper. 05/05/19   Croitoru, Mihai, MD    Allergies    Codeine  Review of Systems   Review of Systems  Unable to perform ROS: Dementia    Physical Exam Updated Vital Signs BP (!) 164/84   Pulse 97 Comment: Simultaneous filing. User may not have seen previous data.  Temp 97.6 F (36.4 C) (Oral)   Resp 16   SpO2 98% Comment: Simultaneous filing. User may not have seen previous data.  Physical Exam Vitals and nursing note reviewed.  Constitutional:      General: She is not in acute distress.    Appearance: She is well-developed. She is not diaphoretic.  HENT:     Head: Normocephalic and atraumatic.     Mouth/Throat:     Mouth: Mucous membranes are moist.  Eyes:     Extraocular Movements: Extraocular movements intact.     Pupils: Pupils are equal, round, and reactive to light.  Cardiovascular:     Rate and Rhythm: Normal rate and regular rhythm.     Heart sounds: No murmur. No friction rub. No gallop.   Pulmonary:     Effort: Pulmonary effort is normal. No respiratory distress.     Breath sounds: Normal breath sounds. No wheezing.  Abdominal:     General: Bowel sounds are normal. There is no distension.     Palpations: Abdomen is soft.     Tenderness: There is no abdominal tenderness.  Musculoskeletal:        General: No swelling. Normal range of motion.     Cervical back: Normal range of motion and neck supple.     Right lower leg: No edema.     Left lower leg: No edema.  Skin:    General: Skin is warm and dry.  Neurological:     General: No focal deficit present.     Mental Status: She is alert and oriented to person, place, and time.     Cranial Nerves: No cranial nerve deficit.     Sensory: No sensory deficit.     Motor: No weakness.     Coordination:  Coordination normal.     ED Results / Procedures / Treatments   Labs (all labs ordered are listed, but only abnormal results are displayed) Labs Reviewed  COMPREHENSIVE METABOLIC PANEL - Abnormal; Notable for the following components:      Result Value   Glucose, Bld 103 (*)    Creatinine, Ser 1.18 (*)    GFR calc non Af Amer 41 (*)    GFR calc Af Amer 48 (*)    All  other components within normal limits  CBC WITH DIFFERENTIAL/PLATELET  URINALYSIS, ROUTINE W REFLEX MICROSCOPIC  CBG MONITORING, ED    EKG EKG Interpretation  Date/Time:  Monday June 06 2019 20:11:25 EST Ventricular Rate:  74 PR Interval:    QRS Duration: 74 QT Interval:  398 QTC Calculation: 441 R Axis:   47 Text Interpretation: Atrial fibrillation with a competing junctional pacemaker ST & T wave abnormality, consider inferior ischemia ST & T wave abnormality, consider anterolateral ischemia Abnormal ECG Confirmed by Veryl Speak [phone]) on 06/07/2019 7:47:04 AM   Radiology DG Chest Port 1 View  Result Date: 06/05/2019 CLINICAL DATA:  Shortness of breath EXAM: PORTABLE CHEST 1 VIEW COMPARISON:  June 03, 2019 FINDINGS: Lungs are clear. Heart is mildly enlarged, stable, with pulmonary vascularity normal. No adenopathy. There is aortic atherosclerosis. No bone lesions. IMPRESSION: Stable cardiac prominence. Lungs clear. Aortic Atherosclerosis (ICD10-I70.0). Electronically Signed   By: Lowella Grip III M.D.   On: 06/05/2019 14:11    Procedures Procedures (including critical care time)  Medications Ordered in ED Medications  sodium chloride 0.9 % bolus 500 mL (has no administration in time range)    ED Course  I have reviewed the triage vital signs and the nursing notes.  Pertinent labs & imaging results that were available during my care of the patient were reviewed by me and considered in my medical decision making (see chart for details).    MDM Rules/Calculators/A&P  Patient is an  86 year old female with past medical history of dementia and persistent atrial fibrillation.  She is brought to the ER today for the third time in 4 days for evaluation of weakness.  Patient has been apparently less steady at home and has fallen on at least one occasion.  Her prior ER visits have resulted in negative work-ups and patient discharged both times.  She returns today at the request of her son who states that she is unsteady and not quite herself.  Patient's laboratory studies are essentially unremarkable.  I have reviewed her studies from her prior visits and she has had a negative head CT.  Her son is concerned about the possibility of stroke.  Patient was sent for an MRI which did reveal several areas of cortical infarct in the right occipital lobe.  This finding was discussed with Dr. Cheral Marker from neurology who would like the patient admitted for stroke work-up.  I have spoken with Dr. Lorin Mercy who agrees to admit.  Final Clinical Impression(s) / ED Diagnoses Final diagnoses:  None    Rx / DC Orders ED Discharge Orders    None       Veryl Speak, MD 06/07/19 1202

## 2019-06-07 NOTE — ED Notes (Signed)
Pt walked outside the room thinking she saw her son. RN moving pt to hallway.

## 2019-06-07 NOTE — ED Notes (Signed)
Patient transported to MRI 

## 2019-06-07 NOTE — Progress Notes (Signed)
Patient attempting to get out of bed on multiple occassions and was redirected unsuccessfully.  Patient has removed two different iv's and telemetry monitoring numerous times.  Mittens applied to both hands and Haldol IV administered.  RN toileted patient and feed her dinner.

## 2019-06-07 NOTE — Consult Note (Addendum)
Neurology Consultation  Reason for Consult: Stroke  Referring Physician: Dr. Lorin Mercy  CC: Generalized weakness  History is obtained from: Chart  HPI: Tiffany Charles is an 86 y.o. female with a past medical history of tricuspid regurgitation, permanent atrial fibrillation on Xarelto, hyperlipidemia, essential hypertension, CAD.  Patient has been admitted to the ED secondary to generalized weakness.  On MRI patient was found to have acute right occipital lobe strokes.  For that reason neurology was consulted  Patient was recently seen in the ED 06/05/2019 secondary to acute onset of weakness, shortness of breath and some chest pain.  Patient was seen prior to that in the ED on 06/03/2019 for assessment of falls.    Patient again returned to the ED on 06/06/2019 for generalized weakness. Unfortunately no history can be obtained from patient as she appears to have cognitive deficits; she is unable to answer any questions about her past medical history and does not know where she is.  MRI was obtained during the current ED admission to evaluate for possible stroke.  Unfortunately MRI revealed several small cortical infarcts in the right occipital lobe; also noted was atrophy and chronic microvascular ischemia.  ED course  Labs-to evaluate for electrolyte abnormalities MRI brain-to evaluate for stroke, mass, bleed  Chart review (no prior neurology notes found in Epic)  LKW: Unknown tpa given?: no, out of time window Premorbid modified Rankin scale (mRS): 3 NIH stroke score: 2 for missing orientation questions  Stroke risk factors: Chronic diastolic CHF, CAD, HTN, HLD and permanent atrial fibrillation.   Past Medical History:  Diagnosis Date  . Angina pectoris   . Arthritis   . Chronic anticoagulation    Xarelto  . Chronic diastolic (congestive) heart failure ([place])   . COPD (chronic obstructive pulmonary disease) ([place])   . Coronary artery disease    a. s/p RCA stent 2008. b. cath 02/2009:  30% mLAD, 90% AV groove cx unchanged from prior, 50-60% RCA, EF 60% -> med rx.  . Depression   . Essential hypertension   . Hyperlipidemia   . Mild pulmonary hypertension ([place]) 09/17/2016  . Permanent atrial fibrillation ([place])   . Skin cancer Jan 2013   squamous cell- removed  . Tricuspid regurgitation 09/17/2016    Family History  Problem Relation Age of Onset  . Stroke Mother   . Heart disease Mother   . Coronary artery disease Father   . Heart disease Father   . Stroke Sister   . Heart disease Brother   . Stroke Sister   . Stroke Sister    Social History:   reports that she has quit smoking. She smoked 0.50 packs per day. She has never used smokeless tobacco. She reports that she does not drink alcohol or use drugs.  Medications No current facility-administered medications for this encounter.  Current Outpatient Medications:  .  metoprolol succinate (TOPROL-XL) 25 MG 24 hr tablet, Take 1 tablet (25 mg total) by mouth daily., Disp: 90 tablet, Rfl: 0 .  nicotine (NICODERM CQ - DOSED IN MG/24 HOURS) 14 mg/24hr patch, Place 1 patch (14 mg total) onto the skin daily., Disp: 28 patch, Rfl: 0 .  pravastatin (PRAVACHOL) 40 MG tablet, Take 1 tablet (40 mg total) by mouth at bedtime. (Patient not taking: Reported on 06/03/2019), Disp: 90 tablet, Rfl: 0 .  Rivaroxaban (XARELTO) 15 MG TABS tablet, Take 1 tablet (15 mg total) by mouth daily before supper., Disp: 90 tablet, Rfl: 0  ROS:    General  ROS: Positive for -weakness Psychological ROS: negative for - behavioral disorder, hallucinations, memory difficulties, mood swings or suicidal ideation Ophthalmic ROS: negative for - blurry vision, double vision, eye pain or loss of vision ENT ROS: negative for - epistaxis, nasal discharge, oral lesions, sore throat, tinnitus or vertigo Respiratory ROS: negative for - cough, hemoptysis, shortness of breath or wheezing Cardiovascular ROS: negative for - chest pain, dyspnea on exertion, edema or  irregular heartbeat Gastrointestinal ROS: negative for - abdominal pain, diarrhea, hematemesis, nausea/vomiting or stool incontinence Genito-Urinary ROS: negative for - dysuria, hematuria, incontinence or urinary frequency/urgency Musculoskeletal ROS: Weakness for -  muscular weakness Neurological ROS: as noted in HPI Dermatological ROS: negative for rash and skin lesion changes  Exam: Current vital signs: BP (!) 171/94   Pulse 86   Temp 97.6 F (36.4 C) (Oral)   Resp (!) 21   SpO2 100%  Vital signs in last 24 hours: Temp:  [97.6 F (36.4 C)-99.5 F (37.5 C)] 97.6 F (36.4 C) (01/12 0751) Pulse Rate:  [72-97] 86 (01/12 1030) Resp:  [14-21] 21 (01/12 1100) BP: (139-194)/(76-103) 171/94 (01/12 1100) SpO2:  [96 %-100 %] 100 % (01/12 1030)   Constitutional: Cachectic Eyes: No scleral injection HENT: No OP obstrucion Head: Normocephalic.  Cardiovascular: Irregular irregular Respiratory: Effort normal, non-labored breathing GI: Soft.  No distension. There is no tenderness.  Skin: WDI  Neuro: Mental Status: Patient is awake and alert. She does not know her location, is unable to answer questions about date or year.  In addition she is unable to name fingers but she is able to tell me my glove is purple. There is no receptive or expressive aphasia in the context of confusion and cognitive deficit. No dysarthria. Cranial Nerves: II: Partial field cut is seen in the left upper quadrant of the visual field of each eye. PERRL.   III,IV, VI: EOMI without ptosis or diplopia.  V: Facial sensation is symmetric to temperature VII: Facial movement is symmetric.  VIII: hearing is intact to voice X: Palate elevates symmetrically XI: Shoulder shrug is symmetric. XII: Tongue is midline without atrophy or fasciculations.  Motor: 4+/5 throughout. Sensory: Sensation is symmetric to light touch and temperature in the arms and legs. Deep Tendon Reflexes: 2+ and symmetric in the biceps and  patellae.  Plantars: Toes are downgoing bilaterally.  Cerebellar: FNF within normal limits  Labs I have reviewed labs in epic and the results pertinent to this consultation are:   CBC    Component Value Date/Time   WBC 8.8 06/06/2019 2009   RBC 4.01 06/06/2019 2009   HGB 13.0 06/06/2019 2009   HCT 39.6 06/06/2019 2009   PLT 192 06/06/2019 2009   MCV 98.8 06/06/2019 2009   MCH 32.4 06/06/2019 2009   MCHC 32.8 06/06/2019 2009   RDW 13.7 06/06/2019 2009   LYMPHSABS 3.4 06/06/2019 2009   MONOABS 0.8 06/06/2019 2009   EOSABS 0.2 06/06/2019 2009   BASOSABS 0.1 06/06/2019 2009    CMP     Component Value Date/Time   NA 142 06/06/2019 2009   NA 139 07/30/2017 1102   K 3.9 06/06/2019 2009   CL 106 06/06/2019 2009   CO2 27 06/06/2019 2009   GLUCOSE 103 (H) 06/06/2019 2009   BUN 19 06/06/2019 2009   BUN 7 (L) 07/30/2017 1102   CREATININE 1.18 (H) 06/06/2019 2009   CREATININE 0.87 07/11/2013 0918   CALCIUM 9.4 06/06/2019 2009   PROT 6.5 06/06/2019 2009   PROT 6.5 07/30/2017  1102   ALBUMIN 3.9 06/06/2019 2009   ALBUMIN 4.3 07/30/2017 1102   AST 28 06/06/2019 2009   ALT 10 06/06/2019 2009   ALKPHOS 59 06/06/2019 2009   BILITOT 0.7 06/06/2019 2009   BILITOT 0.4 07/30/2017 1102   GFRNONAA 41 (L) 06/06/2019 2009   GFRAA 48 (L) 06/06/2019 2009    Lipid Panel     Component Value Date/Time   CHOL 194 06/08/2018 1520   CHOL 170 07/30/2017 1102   TRIG 95.0 06/08/2018 1520   HDL 52.10 06/08/2018 1520   HDL 60 07/30/2017 1102   CHOLHDL 4 06/08/2018 1520   VLDL 19.0 06/08/2018 1520   LDLCALC 123 (H) 06/08/2018 1520   LDLCALC 99 07/30/2017 1102     Imaging I have reviewed the images obtained:  MRI examination of the brain-several small cortical infarcts in the right occipital lobe.  Etta Quill PA-C [facility] [phone] 06/07/2019, 11:17 AM    Assessment: 86 year old female with chronic atrial fibrillation on Xarelto.  Patient has had multiple days of  weakness in addition to falls.  She has been brought to the emergency department 3 times in the last week.  At this time no history can be obtained from her secondary to confusion and what likely represents neurocognitive decline.   1. On exam there is no significant weakness.  However, partial field cut is seen in the left upper quadrant of her visual fields bilaterally.  2. MRI brain does show several small cortical infarcts in the right occipital lobe which would be in the right PCA distribution.  The acute infarctions may be cardioembolic; however, patient is on Xarelto at this time. 3. Stroke risk factors: Chronic diastolic CHF, CAD, HTN, HLD and permanent atrial fibrillation.   Recommendations: # MRA Head  # Carotid ultrasound # Transthoracic Echo   # Continue Xarelto  # Continue pravachol # BP management. Out of permissive HTN time window # HBAIC and Lipid profile # Telemetry monitoring # Frequent neuro checks # NPO until passes stroke swallow screen # Follow-up with cardiology # PT/OT/speech evaluations # please page stroke NP  Or  PA  Or MD from 8am -4 pm  as this patient from this time will be  followed by the stroke.   You can look them up on [email or web address]  Password [email or web address]   I have seen and examined the patient. I have formulated the assessment and recommendations. 86 year old female presenting with acute right occipital lobe strokes. Exam reveals partial left sided visual field cut and cognitive deficits. Recommendations as above.  Electronically signed: Dr. Kerney Elbe

## 2019-06-07 NOTE — Telephone Encounter (Signed)
I will check on her

## 2019-06-07 NOTE — Progress Notes (Signed)
Pt arrived to the unit, orientated to the room, call light within reach and remote.

## 2019-06-07 NOTE — ED Notes (Signed)
Updated patient's son on current status

## 2019-06-07 NOTE — Progress Notes (Signed)
  Echocardiogram 2D Echocardiogram has been performed.  Tiffany Charles 06/07/2019, 3:38 PM
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
## 2019-06-07 NOTE — Progress Notes (Signed)
Pt up out of bed without assistance, refused to go back to room, pulled IV out and removed tele stickers from chest. MD notified. Was able to get to pt back in bed after reorientating the pt for 5 minutes. Patient continues to refuse tele monitor at this time.

## 2019-06-07 NOTE — Telephone Encounter (Signed)
Follow up   Patient's son wants Dr. Sallyanne Kuster to know that patient is back in ER today. Please call to discuss.

## 2019-06-08 ENCOUNTER — Telehealth: Payer: Self-pay | Admitting: Internal Medicine

## 2019-06-08 DIAGNOSIS — I4891 Unspecified atrial fibrillation: Secondary | ICD-10-CM

## 2019-06-08 DIAGNOSIS — J449 Chronic obstructive pulmonary disease, unspecified: Secondary | ICD-10-CM

## 2019-06-08 DIAGNOSIS — Z66 Do not resuscitate: Secondary | ICD-10-CM | POA: Diagnosis present

## 2019-06-08 DIAGNOSIS — Z23 Encounter for immunization: Secondary | ICD-10-CM | POA: Diagnosis not present

## 2019-06-08 DIAGNOSIS — I639 Cerebral infarction, unspecified: Secondary | ICD-10-CM

## 2019-06-08 LAB — LIPID PANEL
Cholesterol: 181 mg/dL (ref 0–200)
HDL: 55 mg/dL
LDL Cholesterol: 114 mg/dL — ABNORMAL HIGH (ref 0–99)
Total CHOL/HDL Ratio: 3.3 ratio
Triglycerides: 59 mg/dL
VLDL: 12 mg/dL (ref 0–40)

## 2019-06-08 LAB — HEMOGLOBIN A1C
Hgb A1c MFr Bld: 5 % (ref 4.8–5.6)
Mean Plasma Glucose: 96.8 mg/dL

## 2019-06-08 LAB — VITAMIN B12: Vitamin B-12: 277 pg/mL (ref 180–914)

## 2019-06-08 MED ORDER — ATORVASTATIN CALCIUM 40 MG PO TABS: 40.0000 mg | ORAL_TABLET | Freq: Every day | ORAL | 1 refills | Status: DC

## 2019-06-08 MED ORDER — ENSURE ENLIVE PO LIQD: 237.0000 mL | Freq: Two times a day (BID) | ORAL | Status: DC

## 2019-06-08 NOTE — Telephone Encounter (Signed)
LOV with PCP was 05/2018. Pt dc from hospital admission 06/08/2019. Darlene from [facility] stated dc notes could be used for [facility] order. Please advise if you agree or if we need to schedule hospital f/u.

## 2019-06-08 NOTE — Discharge Instructions (Signed)

## 2019-06-08 NOTE — Telephone Encounter (Signed)
Spoke with Carlyon Shadow, per PCP it is ok to use dc notes for entering [facility] order.

## 2019-06-08 NOTE — Progress Notes (Signed)
Initial Nutrition Assessment  DOCUMENTATION CODES:   Not applicable  INTERVENTION:  Provide Ensure Enlive po BID, each supplement provides 350 kcal and 20 grams of protein  Encourage adequate PO intake.   NUTRITION DIAGNOSIS:   Increased nutrient needs related to chronic illness(COPD, CHF) as evidenced by estimated needs.  GOAL:   Patient will meet greater than or equal to 90% of their needs  MONITOR:   PO intake, Supplement acceptance, Skin, Weight trends, Labs, I & O's  REASON FOR ASSESSMENT:   Consult Assessment of nutrition requirement/status  ASSESSMENT:   86 y.o. female  with medical history significant of CAD s/p stent; afib; HTN; HLD; depression; dementia; COPD; and chronic diastolic CHF presenting with generalized weakness. MRI positive for acute occipital CVA  Meal completion 5% at lunch time today. Nurse tech at bedside reports pt only wanted to consume one pineapple. Pt reports eating well PTA, however unable to quantify food usually eaten at meals. Noted no new weight recorded. RN to obtain new weight. RD to order nutritional supplements to aid in caloric and protein needs.    NUTRITION - FOCUSED PHYSICAL EXAM:  Depletion may be likely related to the natural aging process.    Most Recent Value  Orbital Region  Unable to assess  Upper Arm Region  Moderate depletion  Thoracic and Lumbar Region  Unable to assess  Buccal Region  Unable to assess  Temple Region  Unable to assess  Clavicle Bone Region  Moderate depletion  Clavicle and Acromion Bone Region  Moderate depletion  Scapular Bone Region  Unable to assess  Dorsal Hand  Unable to assess  Patellar Region  No depletion  Anterior Thigh Region  Unable to assess  Posterior Calf Region  No depletion  Edema (RD Assessment)  None  Hair  Reviewed  Eyes  Reviewed  Mouth  Reviewed  Skin  Reviewed  Nails  Reviewed      Labs and mediations reviewed.   Diet Order:   Diet Order            Diet - low  sodium heart healthy        Diet Heart Fluid consistency: Thin  Diet effective ____              EDUCATION NEEDS:   Not appropriate for education at this time  Skin:  Skin Assessment: Reviewed RN Assessment  Last BM:  1/11  Height:   Ht Readings from Last 1 Encounters:  01/10/19 5\' 3"  (1.6 m)    Weight:   Wt Readings from Last 1 Encounters:  01/10/19 53.8 kg    Ideal Body Weight:  52.27 kg  BMI:  There is no height or weight on file to calculate BMI.  Estimated Nutritional Needs:   Kcal:  1500-1650  Protein:  70-80 grams  Fluid:  >/= 1.5 L/day    Corrin Parker, MS, RD, LDN Pager # [phone] After hours/ weekend pager # [phone]

## 2019-06-08 NOTE — Progress Notes (Signed)
STROKE TEAM PROGRESS NOTE   INTERVAL HISTORY Pt lying in bed, sitter at bedside. She is only orientated to self. She has no focal deficit on my exam. I called her son over the phone and he confirmed that pt was not taking Xarelto for more than 2 months, but he started to give her for the last 2 weeks but she still probably missed 3-5 days of the medication. He will supervise her meds from now on.   Vitals:   06/07/19 2327 06/08/19 0000 06/08/19 0345 06/08/19 0900  BP: (!) 146/76 134/82 (!) 149/73 128/76  Pulse: 94 99 100 84  Resp: 18 16 19 20   Temp: (!) 97.5 F (36.4 C) 98 F (36.7 C) 98 F (36.7 C) 98.2 F (36.8 C)  TempSrc: Oral Oral Oral   SpO2: 97%  98% 96%    CBC:  Recent Labs  Lab 06/05/19 1346 06/06/19 2009  WBC 8.7 8.8  NEUTROABS 4.9 4.3  HGB 12.6 13.0  HCT 38.6 39.6  MCV 99.5 98.8  PLT 176 [id number]    Basic Metabolic Panel:  Recent Labs  Lab 06/05/19 1346 06/06/19 2009  NA 142 142  K 3.5 3.9  CL 104 106  CO2 30 27  GLUCOSE 103* 103*  BUN 18 19  CREATININE 0.87 1.18*  CALCIUM 9.0 9.4   Lipid Panel:     Component Value Date/Time   CHOL 181 06/08/2019 0621   CHOL 170 07/30/2017 1102   TRIG 59 06/08/2019 0621   HDL 55 06/08/2019 0621   HDL 60 07/30/2017 1102   CHOLHDL 3.3 06/08/2019 0621   VLDL 12 06/08/2019 0621   LDLCALC 114 (H) 06/08/2019 0621   LDLCALC 99 07/30/2017 1102   HgbA1c:  Lab Results  Component Value Date   HGBA1C 5.0 06/08/2019   IMAGING past 48 hours CT ANGIO HEAD W OR WO CONTRAST  Result Date: 06/07/2019 CLINICAL DATA:  Generalized weakness and gait disturbance. Confusion. Small right occipital cortical infarctions by MRI. EXAM: CT ANGIOGRAPHY HEAD AND NECK TECHNIQUE: Multidetector CT imaging of the head and neck was performed using the standard protocol during bolus administration of intravenous contrast. Multiplanar CT image reconstructions and MIPs were obtained to evaluate the vascular anatomy. Carotid stenosis measurements (when  applicable) are obtained utilizing NASCET criteria, using the distal internal carotid diameter as the denominator. CONTRAST:  75 cc Omnipaque 350 COMPARISON:  MRI same day FINDINGS: CT HEAD FINDINGS Brain: Age related atrophy and chronic small-vessel ischemic change of the white matter. Small infarctions in the right occipital lobe shown by MRI are not visible by CT. No mass lesion, hemorrhage, hydrocephalus or extra-axial collection. Vascular: There is atherosclerotic calcification of the major vessels at the base of the brain. Skull: Negative Sinuses: Clear/normal Orbits: None Review of the MIP images confirms the above findings CTA NECK FINDINGS Aortic arch: Aortic atherosclerosis. No aneurysm or dissection. Branching pattern is normal. No flow limiting origin stenosis. Right carotid system: Common carotid artery is widely patent to the bifurcation. There is soft and calcified plaque at the carotid bifurcation and ICA bulb. Minimal diameter in the ICA bulb is 3.5 mm. Compared to a more distal cervical ICA diameter of 5 mm, this indicates a 30% stenosis. Left carotid system: Common carotid artery widely patent to the bifurcation region. Soft and calcified plaque at the carotid bifurcation and ICA bulb. No stenosis compared to the more distal cervical ICA. Vertebral arteries: Right vertebral artery is dominant. 30% stenosis of the proximal 1 cm of the  vessel. Calcified plaque affecting the proximal 2 cm of the vessel, with an additional 30% stenosis. Beyond that, the vessel is widely patent through the cervical region to the foramen magnum. Non dominant left vertebral artery is a small vessel, widely patent at its origin and through the cervical region to the foramen magnum. Skeleton: Mild cervical spondylosis. Other neck: No mass or lymphadenopathy. Upper chest: Ordinary mild apical scarring and emphysema. No active process. Review of the MIP images confirms the above findings CTA HEAD FINDINGS Anterior  circulation: Both internal carotid arteries are patent through the skull base and siphon regions. There is ordinary siphon atherosclerotic calcification but without stenosis greater than 30%. Right common carotid artery supplies the middle cerebral artery and both anterior cerebral arteries. The A1 segment on the left is very diminutive. There is atherosclerotic irregularity and mild narrowing of the M1 segment, but without stenosis greater than 30%. More distal ACA and MCA branch vessels do show atherosclerotic irregularity. Left internal carotid artery supplies the left middle cerebral artery and a small A1 segment as noted above. There is atherosclerotic irregularity of the M1 and M2 branches as well as the small A1 segment. No severe stenosis. Posterior circulation: Dominant right vertebral artery shows mild irregularity in the V4 segment but no stenosis. This vessel is widely patent to the basilar. No basilar stenosis. Tiny left vertebral artery supplies PICA and is then occluded. Anterior inferior cerebellar arteries show flow. Superior cerebellar arteries show flow. Both posterior cerebral arteries show flow. There are patent bilateral posterior communicating arteries. PCA branch vessels show narrowing and irregularity but no sign of frank occlusion or severe proximal stenosis. Venous sinuses: Patent and normal. Anatomic variants: None other significant. Review of the MIP images confirms the above findings IMPRESSION: No acute large or medium vessel occlusion. Aortic atherosclerosis.  No aneurysm or dissection. Carotid bifurcation atherosclerosis. No stenosis on the left. 30% ICA stenosis on the right. Dominant right vertebral artery shows considerable atherosclerotic change in its proximal 1-2 cm, with narrowing and irregularity. Serial 30% stenoses in that region. This could be a source of embolic disease however. Tiny left vertebral artery is patent to PICA but occluded beyond PICA. The age of this  occlusion is indeterminate. It could be chronic or recent. In any case, the right vertebral artery gives ample supply to the basilar. No basilar stenosis. No posterior circulation branch vessel occlusion, with specific attention to the right PCA. Distal vessel atherosclerotic irregularity is seen diffusely. Intracranial anterior circulation shows atherosclerotic change in the carotid siphon regions but no stenosis greater than 30%. There is atherosclerotic irregularity and narrowing throughout the anterior and middle cerebral vessels, but without severe proximal flow limiting stenosis or any evidence of large or medium vessel occlusion. Electronically Signed   By: Covey Chimes M.D.   On: 06/07/2019 13:33   CT ANGIO NECK W OR WO CONTRAST  Result Date: 06/07/2019 CLINICAL DATA:  Generalized weakness and gait disturbance. Confusion. Small right occipital cortical infarctions by MRI. EXAM: CT ANGIOGRAPHY HEAD AND NECK TECHNIQUE: Multidetector CT imaging of the head and neck was performed using the standard protocol during bolus administration of intravenous contrast. Multiplanar CT image reconstructions and MIPs were obtained to evaluate the vascular anatomy. Carotid stenosis measurements (when applicable) are obtained utilizing NASCET criteria, using the distal internal carotid diameter as the denominator. CONTRAST:  75 cc Omnipaque 350 COMPARISON:  MRI same day FINDINGS: CT HEAD FINDINGS Brain: Age related atrophy and chronic small-vessel ischemic change of the white matter.  Small infarctions in the right occipital lobe shown by MRI are not visible by CT. No mass lesion, hemorrhage, hydrocephalus or extra-axial collection. Vascular: There is atherosclerotic calcification of the major vessels at the base of the brain. Skull: Negative Sinuses: Clear/normal Orbits: None Review of the MIP images confirms the above findings CTA NECK FINDINGS Aortic arch: Aortic atherosclerosis. No aneurysm or dissection. Branching  pattern is normal. No flow limiting origin stenosis. Right carotid system: Common carotid artery is widely patent to the bifurcation. There is soft and calcified plaque at the carotid bifurcation and ICA bulb. Minimal diameter in the ICA bulb is 3.5 mm. Compared to a more distal cervical ICA diameter of 5 mm, this indicates a 30% stenosis. Left carotid system: Common carotid artery widely patent to the bifurcation region. Soft and calcified plaque at the carotid bifurcation and ICA bulb. No stenosis compared to the more distal cervical ICA. Vertebral arteries: Right vertebral artery is dominant. 30% stenosis of the proximal 1 cm of the vessel. Calcified plaque affecting the proximal 2 cm of the vessel, with an additional 30% stenosis. Beyond that, the vessel is widely patent through the cervical region to the foramen magnum. Non dominant left vertebral artery is a small vessel, widely patent at its origin and through the cervical region to the foramen magnum. Skeleton: Mild cervical spondylosis. Other neck: No mass or lymphadenopathy. Upper chest: Ordinary mild apical scarring and emphysema. No active process. Review of the MIP images confirms the above findings CTA HEAD FINDINGS Anterior circulation: Both internal carotid arteries are patent through the skull base and siphon regions. There is ordinary siphon atherosclerotic calcification but without stenosis greater than 30%. Right common carotid artery supplies the middle cerebral artery and both anterior cerebral arteries. The A1 segment on the left is very diminutive. There is atherosclerotic irregularity and mild narrowing of the M1 segment, but without stenosis greater than 30%. More distal ACA and MCA branch vessels do show atherosclerotic irregularity. Left internal carotid artery supplies the left middle cerebral artery and a small A1 segment as noted above. There is atherosclerotic irregularity of the M1 and M2 branches as well as the small A1 segment. No  severe stenosis. Posterior circulation: Dominant right vertebral artery shows mild irregularity in the V4 segment but no stenosis. This vessel is widely patent to the basilar. No basilar stenosis. Tiny left vertebral artery supplies PICA and is then occluded. Anterior inferior cerebellar arteries show flow. Superior cerebellar arteries show flow. Both posterior cerebral arteries show flow. There are patent bilateral posterior communicating arteries. PCA branch vessels show narrowing and irregularity but no sign of frank occlusion or severe proximal stenosis. Venous sinuses: Patent and normal. Anatomic variants: None other significant. Review of the MIP images confirms the above findings IMPRESSION: No acute large or medium vessel occlusion. Aortic atherosclerosis.  No aneurysm or dissection. Carotid bifurcation atherosclerosis. No stenosis on the left. 30% ICA stenosis on the right. Dominant right vertebral artery shows considerable atherosclerotic change in its proximal 1-2 cm, with narrowing and irregularity. Serial 30% stenoses in that region. This could be a source of embolic disease however. Tiny left vertebral artery is patent to PICA but occluded beyond PICA. The age of this occlusion is indeterminate. It could be chronic or recent. In any case, the right vertebral artery gives ample supply to the basilar. No basilar stenosis. No posterior circulation branch vessel occlusion, with specific attention to the right PCA. Distal vessel atherosclerotic irregularity is seen diffusely. Intracranial anterior circulation shows atherosclerotic  change in the carotid siphon regions but no stenosis greater than 30%. There is atherosclerotic irregularity and narrowing throughout the anterior and middle cerebral vessels, but without severe proximal flow limiting stenosis or any evidence of large or medium vessel occlusion. Electronically Signed   By: Ildefonso Chimes M.D.   On: 06/07/2019 13:33   MR BRAIN WO CONTRAST  Result  Date: 06/07/2019 CLINICAL DATA:  Stroke.  Ataxia. EXAM: MRI HEAD WITHOUT CONTRAST TECHNIQUE: Multiplanar, multiecho pulse sequences of the brain and surrounding structures were obtained without intravenous contrast. COMPARISON:  CT head 06/03/2019 FINDINGS: Brain: Several small acute infarcts in the right occipital lobe. These appear to be cortical infarct. Otherwise no acute infarct. Generalized atrophy. Mild chronic ischemic changes in the white matter. Negative for hemorrhage mass or hydrocephalus. Vascular: Normal arterial flow voids. Skull and upper cervical spine: Negative Sinuses/Orbits: Paranasal sinuses clear.  Bilateral cataract surgery Other: None IMPRESSION: Several small cortical infarct right occipital lobe. Atrophy and chronic microvascular ischemia. Electronically Signed   By: Franchot Gallo M.D.   On: 06/07/2019 10:09   ECHOCARDIOGRAM COMPLETE  Result Date: 06/07/2019   ECHOCARDIOGRAM REPORT   Patient Name:   Tiffany Charles Date of Exam: 06/07/2019 Medical Rec #:  [id number]      Height:       63.0 in Accession #:    [id number]     Weight:       118.6 lb Date of Birth:  1929/08/05     BSA:          1.55 m Patient Age:    30 years       BP:           169/95 mmHg Patient Gender: F              HR:           75 bpm. Exam Location:  Inpatient Procedure: 2D Echo Indications:    stroke 434.91  History:        Patient has prior history of Echocardiogram examinations, most                 recent 09/17/2016. CHF, CAD, COPD, Arrythmias:Atrial                 Fibrillation; Risk Factors:Hypertension, Dyslipidemia and Former                 Smoker. Mild pulmonary hypertension. former smoker.  Sonographer:    Jannett Celestine RDCS (AE) Referring Phys: [place]  1. Left ventricular ejection fraction, by visual estimation, is 60 to 65%. The left ventricle has normal function. There is no left ventricular hypertrophy.  2. Left ventricular diastolic function could not be evaluated.  3. The left  ventricle has no regional wall motion abnormalities.  4. Global right ventricle has normal systolic function.The right ventricular size is normal. No increase in right ventricular wall thickness.  5. Left atrial size was severely dilated.  6. Right atrial size was severely dilated.  7. The mitral valve is normal in structure. Trivial mitral valve regurgitation. No evidence of mitral stenosis.  8. Moderate to severe tricuspid stenosis.  9. The tricuspid valve is normal in structure. 10. The aortic valve is normal in structure. Aortic valve regurgitation is not visualized. No evidence of aortic valve sclerosis or stenosis. 11. The pulmonic valve was normal in structure. Pulmonic valve regurgitation is not visualized. 12. Moderately elevated pulmonary artery systolic pressure. 13. The inferior vena cava is  dilated in size with <50% respiratory variability, suggesting right atrial pressure of 15 mmHg. 14. No significant change from 09/17/2016 study. 15. Prior images reviewed side by side. FINDINGS  Left Ventricle: Left ventricular ejection fraction, by visual estimation, is 60 to 65%. The left ventricle has normal function. The left ventricle has no regional wall motion abnormalities. The left ventricular internal cavity size was the left ventricle is normal in size. There is no left ventricular hypertrophy. The left ventricular diastology could not be evaluated due to atrial fibrillation. Left ventricular diastolic function could not be evaluated. Normal left atrial pressure. Right Ventricle: The right ventricular size is normal. No increase in right ventricular wall thickness. Global RV systolic function is has normal systolic function. The tricuspid regurgitant velocity is 2.90 m/s, and with an assumed right atrial pressure  of 15 mmHg, the estimated right ventricular systolic pressure is moderately elevated at 48.6 mmHg. Left Atrium: Left atrial size was severely dilated. Right Atrium: Right atrial size was severely  dilated Prominent Eustachian valve. Pericardium: There is no evidence of pericardial effusion. Mitral Valve: The mitral valve is normal in structure. There is mild thickening of the mitral valve leaflet(s). Trivial mitral valve regurgitation. No evidence of mitral valve stenosis by observation. Tricuspid Valve: The tricuspid valve is normal in structure. Tricuspid valve regurgitation moderate. Moderate to severe tricuspid stenosis is present. Aortic Valve: The aortic valve is normal in structure. Aortic valve regurgitation is not visualized. The aortic valve is structurally normal, with no evidence of sclerosis or stenosis. Pulmonic Valve: The pulmonic valve was normal in structure. Pulmonic valve regurgitation is not visualized. Pulmonic regurgitation is not visualized. Aorta: The aortic root, ascending aorta and aortic arch are all structurally normal, with no evidence of dilitation or obstruction. Venous: The inferior vena cava is dilated in size with less than 50% respiratory variability, suggesting right atrial pressure of 15 mmHg. IAS/Shunts: No atrial level shunt detected by color flow Doppler. There is no evidence of a patent foramen ovale. No ventricular septal defect is seen or detected. There is no evidence of an atrial septal defect.  LEFT VENTRICLE PLAX 2D LVIDd:         3.80 cm LVIDs:         2.20 cm LV PW:         1.00 cm LV IVS:        0.80 cm LVOT diam:     1.80 cm LV SV:         46 ml LV SV Index:   29.57 LVOT Area:     2.54 cm  LEFT ATRIUM             Index       RIGHT ATRIUM           Index LA diam:        4.70 cm 3.04 cm/m  RA Area:     22.00 cm LA Vol (A2C):   34.8 ml 22.47 ml/m RA Volume:   68.70 ml  44.36 ml/m LA Vol (A4C):   60.7 ml 39.20 ml/m LA Biplane Vol: 45.7 ml 29.51 ml/m  AORTIC VALVE LVOT Vmax:   52.20 cm/s LVOT Vmean:  38.000 cm/s LVOT VTI:    0.113 m  AORTA Ao Root diam: 2.80 cm MITRAL VALVE                       TRICUSPID VALVE MV Area (PHT): 2.95 cm  TR Peak  grad:   33.6 mmHg MV PHT:        74.53 msec          TR Vmax:        307.00 cm/s MV Decel Time: 257 msec MV E velocity: 81.40 cm/s 103 cm/s SHUNTS                                    Systemic VTI:  0.11 m                                    Systemic Diam: 1.80 cm  Mihai Croitoru MD Electronically signed by Sanda Klein MD Signature Date/Time: 06/07/2019/6:02:42 PM    Final     PHYSICAL EXAM  Temp:  [97.5 F (36.4 C)-98.2 F (36.8 C)] 98.2 F (36.8 C) (01/13 0900) Pulse Rate:  [84-100] 84 (01/13 0900) Resp:  [16-20] 20 (01/13 0900) BP: (128-182)/(73-140) 128/76 (01/13 0900) SpO2:  [96 %-98 %] 96 % (01/13 0900)  General - Well nourished, well developed, in no apparent distress.  Ophthalmologic - fundi not visualized due to noncooperation.  Cardiovascular - irregularly irregular heart rate and rhythm.  Neuro - awake alert, orientated to self, but not to place time or situation. She has no aphasia, fluent language although paucity of speech. Follows simple commands well. No significant hemianopia or quadrantanopia seen on exam. PERRL. EOMI. Attending to both sides. No facial droop. Tongue midline. Move all extremities symmetrically. Sensation and FTN intact. Gait not tested.   ASSESSMENT/PLAN Ms. NIVAEH HOFLAND is a 86 y.o. female with history of of tricuspid regurgitation, permanent atrial fibrillation on Xarelto, hyperlipidemia, essential hypertension, CAD presenting with recurrent generalized weakness w/ falls 1/8, 1/10 and now 1/12. Found to have stroke on MRI.   Stroke:  R PCA and MCA/PCA infarcts embolic secondary to known atrial fibrillation not compliant with xarelto  CT Head and Spine 1/8 no acute injury. Small vessel disease.   MRI 1/12 several small R occipital cortical infarcts. Small vessel disease. Atrophy.   CTA head & neck no LVO. Aortic atherosclerosis. R ICA 30% stenosis. R VA w/ atherosclerosis and narrowing. Generalized atherosclerosis.   2D Echo EF 60-65%. No source  of embolus   LDL 114  HgbA1c 5.0  Xarelto for VTE prophylaxis  Prescribed Xarelto (rivaroxaban) daily 15mg  prior to admission - missed several months worth but back on x 2 weeks and missed only a few doses, now on Xarelto (rivaroxaban) daily 15 mg. Son is going to supervise pt meds at home from now on  Therapy recommendations:  pending  (lives w/ son who cares for her and her 42yo sister)  Disposition:  pending   Atrial Fibrillation  Home anticoagulation:  Xarelto (rivaroxaban) daily 15 mg prescribed - pt  missed several months worth but back on the past 2 weeks only missing a few doses  Xarelto continued in the hospital  Last INR 1.0 . Continue Xarelto (rivaroxaban) daily at discharge . Son will provide supervision for meds   Hypertension  Stable . Gradually normalize in 2-3 days . Long-term BP goal normotensive  Hyperlipidemia  Home meds:  pravachol 40  Now on lipitor 40  LDL 114, goal < 70  Continue statin at discharge  Tobacco abuse  Current smoker  Smoking cessation counseling provided  Nicotine patch provided  Pt is willing to quit  Other Stroke Risk Factors  Advanced age  Family hx stroke (mother, sister x 3)  Coronary artery disease s/p stent  Chronic diastolic Congestive heart failure  Other Active Problems  Alzheimer's Dementia  COPD  Tricuspid regurgitation  Hospital day # 1  Neurology will sign off. Please call with questions. Pt will follow up with stroke clinic NP at [facility] in about 4 weeks. Thanks for the consult.  Rosalin Hawking, MD PhD Stroke Neurology 06/08/2019 1:50 PM   To contact Stroke Continuity provider, please refer to [email or web address] After hours, contact General Neurology

## 2019-06-08 NOTE — Evaluation (Addendum)
Physical Therapy Evaluation Patient Details Name: Tiffany Charles MRN: [id number] DOB: 03-Jan-1930 Today's Date: 06/08/2019   History of Present Illness  Tiffany Charles is an 86 y.o. female with a past medical history of tricuspid regurgitation, permanent atrial fibrillation on Xarelto, hyperlipidemia, essential hypertension, CAD.  Patient has been admitted to the ED secondary to generalized weakness.  On MRI patient was found to have acute right occipital lobe strokes.  Clinical Impression  Pt admitted with above. Prior to admission, pt lives with her son and has history of Alzheimer's. On PT evaluation, pt ambulating room distances with a walker at a min guard assist level. HR 107-124 bpm, BP 131/88. Displays left eye visual deficits and balance impairments. Suspect pt is fairly close to her functional baseline. Would likely benefit from discharge to a familiar environment with follow up HHPT to maximize functional mobility.    Follow Up Recommendations Home health PT;Supervision/Assistance - 24 hour    Equipment Recommendations  None recommended by PT (has all DME per pt)   Recommendations for Other Services       Precautions / Restrictions Precautions Precautions: Fall Restrictions Weight Bearing Restrictions: No      Mobility  Bed Mobility Overal bed mobility: Needs Assistance Bed Mobility: Supine to Sit     Supine to sit: Min guard     General bed mobility comments: minguard for safety  Transfers Overall transfer level: Needs assistance Equipment used: Rolling walker (2 wheeled) Transfers: Sit to/from Stand Sit to Stand: Min guard         General transfer comment: minguard for safety  Ambulation/Gait Ambulation/Gait assistance: Min guard Gait Distance (Feet): 20 Feet Assistive device: Rolling walker (2 wheeled) Gait Pattern/deviations: Step-through pattern;Decreased stride length;Trunk flexed;Drifts right/left Gait velocity: decreased Gait velocity  interpretation: <1.8 ft/sec, indicate of risk for recurrent falls General Gait Details: cues for proximity to walker and maintaining straight path  Stairs            Wheelchair Mobility    Modified Rankin (Stroke Patients Only) Modified Rankin (Stroke Patients Only) Pre-Morbid Rankin Score: Moderate disability Modified Rankin: Moderately severe disability     Balance Overall balance assessment: Needs assistance Sitting-balance support: No upper extremity supported;Feet supported Sitting balance-Leahy Scale: Fair Sitting balance - Comments: able to cross legs and bend over to access foot on both sides;no loss of balance   Standing balance support: No upper extremity supported;During functional activity Standing balance-Leahy Scale: Fair Standing balance comment: mina for more dynamic reaching during ADL tasks                             Pertinent Vitals/Pain Pain Assessment: No/denies pain    Home Living Family/patient expects to be discharged to:: Private residence Living Arrangements: Children(son) Available Help at Discharge: Family;Available 24 hours/day Type of Home: House Home Access: Stairs to enter   [employer] of Steps: 3 Home Layout: Two level;Able to live on main level with bedroom/bathroom Home Equipment: Shower seat;Walker - 2 wheels;Wheelchair - manual Additional Comments: per chart, pt lives with her son who cares for her and her 84 year old sister    Prior Function Level of Independence: Needs assistance      ADL's / Homemaking Assistance Needed: pt reports independence with ADL;reports she was still driving until hospitilization  Comments: unsure accuracy of information provided by pt due to cognitive limitations     Hand Dominance   Dominant Hand: Right  Extremity/Trunk Assessment   Upper Extremity Assessment Upper Extremity Assessment: Generalized weakness    Lower Extremity Assessment Lower Extremity  Assessment: Generalized weakness    Cervical / Trunk Assessment Cervical / Trunk Assessment: Kyphotic  Communication   Communication: No difficulties  Cognition Arousal/Alertness: Awake/alert Behavior During Therapy: WFL for tasks assessed/performed Overall Cognitive Status: No family/caregiver present to determine baseline cognitive functioning Area of Impairment: Orientation;Attention;Memory;Following commands;Safety/judgement;Awareness;Problem solving                 Orientation Level: Disoriented to;Person;Place;Time;Situation Current Attention Level: Focused Memory: Decreased short-term memory Following Commands: Follows one step commands with increased time Safety/Judgement: Decreased awareness of safety;Decreased awareness of deficits Awareness: Intellectual Problem Solving: Slow processing;Difficulty sequencing;Requires verbal cues;Requires tactile cues General Comments: pt unable to provide correct year/month despite 3 option choices (states its April 2000) pt reports we are at her daughter in [place] house;requires vc for locating items at sink level;requires increased time for processing information;Alzheimer's at baseline      General Comments General comments (skin integrity, edema, etc.): Pt in A-fib throughout, HR ranging 107-124bpm during session; BP 131/88    Exercises     Assessment/Plan    PT Assessment Patient needs continued PT services  PT Problem List Decreased strength;Decreased activity tolerance;Decreased balance;Decreased mobility;Decreased cognition;Decreased safety awareness       PT Treatment Interventions DME instruction;Gait training;Stair training;Therapeutic activities;Functional mobility training;Therapeutic exercise;Balance training;Patient/family education    PT Goals (Current goals can be found in the Care Plan section)  Acute Rehab PT Goals Patient Stated Goal: pt did not state PT Goal Formulation: With patient/family Time For Goal  Achievement: 06/22/19 Potential to Achieve Goals: Good    Frequency Min 3X/week   Barriers to discharge        Co-evaluation PT/OT/SLP Co-Evaluation/Treatment: Yes Reason for Co-Treatment: For patient/therapist safety;To address functional/ADL transfers PT goals addressed during session: Mobility/safety with mobility OT goals addressed during session: ADL's and self-care       AM-PAC PT "6 Clicks" Mobility  Outcome Measure Help needed turning from your back to your side while in a flat bed without using bedrails?: None Help needed moving from lying on your back to sitting on the side of a flat bed without using bedrails?: A Little Help needed moving to and from a bed to a chair (including a wheelchair)?: A Little Help needed standing up from a chair using your arms (e.g., wheelchair or bedside chair)?: A Little Help needed to walk in hospital room?: A Little Help needed climbing 3-5 steps with a railing? : A Little 6 Click Score: 19    End of Session Equipment Utilized During Treatment: Gait belt Activity Tolerance: Patient tolerated treatment well Patient left: in bed;with call bell/phone within reach;with nursing/sitter in room Nurse Communication: Mobility status PT Visit Diagnosis: Unsteadiness on feet (R26.81);Other abnormalities of gait and mobility (R26.89);Difficulty in walking, not elsewhere classified (R26.2)    Time: [id number] PT Time Calculation (min) (ACUTE ONLY): 24 min   Charges:   PT Evaluation $PT Eval Moderate Complexity: 1 Mod          Tiffany Charles, [place], DPT Acute Rehabilitation Services Pager [phone] Office [phone]   Willy Eddy 06/08/2019, 4:00 PM

## 2019-06-08 NOTE — Telephone Encounter (Signed)
Darlene called from [facility] requesting orders for: RN, PT and a social worker They do have to delay service until Friday. Call back [phone]

## 2019-06-08 NOTE — Progress Notes (Signed)
Discharge instructions given. Patient's son verbalized understanding and all questions were answered.

## 2019-06-08 NOTE — TOC Transition Note (Signed)
Transition of Care [facility]) - CM/SW Discharge Note   Patient Details  Name: Tiffany Charles MRN: [id number] Date of Birth: 07-01-1929  [facility]) CM/SW Contact:  Pollie Friar, RN Phone Number: 06/08/2019, 4:07 PM   Clinical Narrative:    Pt discharging home with [facility] services through [place]. Tommi Rumps with [facility] accepted the referral. Son states patient has 24 hour supervision at home.  Walker for home to be delivered to the room.  Son to provide transport home.   Final next level of care: Home w Home Health Services Barriers to Discharge: No Barriers Identified   Patient Goals and CMS Choice   CMS Medicare.gov Compare Post Acute Care list provided to:: Patient Represenative (must comment) Choice offered to / list presented to : Adult Children  Discharge Placement                       Discharge Plan and Services                DME Arranged: Walker rolling DME Agency: [employer] Date DME Agency Contacted: 06/08/19   Representative spoke with at DME Agency: [place]: PT, OT [place] Agency: [place] Date [place]: 06/08/19   Representative spoke with at [place]: [place] ([place]) Interventions     Readmission Risk Interventions No flowsheet data found.

## 2019-06-08 NOTE — Progress Notes (Signed)
Progress Note    Tiffany Charles  TSV:[id number] DOB: Nov 18, 1929  DOA: 06/06/2019 PCP: [name], MD    Brief Narrative:     Medical records reviewed and are as summarized below:  Tiffany Charles is an 86 y.o. female  with medical history significant of CAD s/p stent; afib; HTN; HLD; depression; dementia; COPD; and chronic diastolic CHF presenting with generalized weakness.  "I just wanted to see the doctor."  She is oriented only to person.  I called and spoke with her son.  He reports that in the last week or so she has been weak with standing, hard to get up.  She reported feeling fine.  She has Alzheimer's and is about the same as usual.  She is on afib and takes Xarelto - she has missed some doses, probably a couple of months worse.  He has had her back on the last 2 weeks and has missed a few doses.  She also has missed her cholesterol medication.  She continues to smoke.  He would not consider SNF rehab but would consider CIR.  Assessment/Plan:   Principal Problem:   Occipital stroke [facility]) Active Problems:   Permanent atrial fibrillation   COPD - PFTs in June 2014 with MET test   Hyperlipidemia   Chronic anticoagulation- Xarelto   Chronic diastolic (congestive) heart failure (HCC)   Essential hypertension   Senile dementia without behavioral disturbance (HCC)   DNR (do not resuscitate)   CVA -Patient with underlying weakness presenting with weakness/falls on 1/8, 1/10, and yesterday -Son was frustrated and reported concern for CVA and so MRI was ordered - and positive for acute occipital CVA -Her baseline cognition does not appear to be impaired by CVA but apparently her strength and balance are impacted -Telemetry monitoring -cta: No acute large or medium vessel occlusion. -Echo -LDL > 70- on statin -This event appears to have been associated with noncompliance with Xarelto; ongoing efforts at compliance will be important -Neurology consult -PT/OT/ST/Nutrition  Consults -Family would consider CIR placement, but not SNF Rehab  HTN -Allow permissive HTNfor now -Treat BP only if >220/120, and then with goal of 15% reduction  HLD -LDL: 114 - statin:  Lipitor 40 mg daily -She has reportedly been inconsistent with taking this medication regularly too  Afib -Rate controlled despite irregular use of Toprol -Also irregular use of Xarelto - this needs to be taken regularly  COPD -She does not appear to be taking medication for this at this time -Smoking cessation should be encouraged  Dementia -Her baseline cognition is significantly impaired and this is not obviously impacted by her current CVA -delirium precautions suspect will worsen as the day progresses  Tobacco dependence -Encourage cessation -Nicotine patch ordered   Family Communication/Anticipated D/C date and plan/Code Status   DVT prophylaxis: xarelto Code Status: dnr Family Communication:  Disposition Plan: pending work up   Medical Consultants:    Neurology    Subjective:     Objective:    Vitals:   06/07/19 2327 06/08/19 0000 06/08/19 0345 06/08/19 0900  BP: (!) 146/76 134/82 (!) 149/73 128/76  Pulse: 94 99 100 84  Resp: _0 Temp: (!) 97.5 F (36.4 C) 98 F (36.7 C) 98 F (36.7 C) 98.2 F (36.8 C)  TempSrc: Oral Oral Oral   SpO2: 97%  98% 96%    Intake/Output Summary (Last 24 hours) at 06/08/2019 1125 Last data filed at 06/08/2019 0930 Gross per 24 hour  Intake 1035 ml  Output 810 ml  Net 225 ml   There were no vitals filed for this visit.  Exam: In bed, NAD Pleasant  irr No increased work of breathing   Data Reviewed:   I have personally reviewed following labs and imaging studies:  Labs: Labs show the following:   Basic Metabolic Panel: Recent Labs  Lab 06/03/19 2044 06/05/19 1346 06/06/19 2009  NA 135 142 142  K 3.3* 3.5 3.9  CL 98 104 106  CO2 _0 GLUCOSE 103* 103* 103*  BUN _1 CREATININE  0.88 0.87 1.18*  CALCIUM 9.1 9.0 9.4   GFR CrCl cannot be calculated (Unknown ideal weight.). Liver Function Tests: Recent Labs  Lab 06/03/19 2044 06/06/19 2009  AST 28 28  ALT 10 10  ALKPHOS 65 59  BILITOT 0.5 0.7  PROT 7.6 6.5  ALBUMIN 4.3 3.9   No results for input(s): LIPASE, AMYLASE in the last 168 hours. No results for input(s): AMMONIA in the last 168 hours. Coagulation profile Recent Labs  Lab 06/03/19 2044  INR 1.0    CBC: Recent Labs  Lab 06/03/19 2044 06/05/19 1346 06/06/19 2009  WBC 10.1 8.7 8.8  NEUTROABS 6.1 4.9 4.3  HGB 13.8 12.6 13.0  HCT 41.1 38.6 39.6  MCV 96.5 99.5 98.8  PLT 182 176 192   Cardiac Enzymes: No results for input(s): CKTOTAL, CKMB, CKMBINDEX, TROPONINI in the last 168 hours. BNP (last 3 results) No results for input(s): PROBNP in the last 8760 hours. CBG: Recent Labs  Lab 06/03/19 1932 06/06/19 2004  GLUCAP 90 98   D-Dimer: Recent Labs    06/05/19 1346  DDIMER 0.68*   Hgb A1c: Recent Labs    06/08/19 0908  HGBA1C 5.0   Lipid Profile: Recent Labs    06/08/19 0621  CHOL 181  HDL 55  LDLCALC 114*  TRIG 59  CHOLHDL 3.3   Thyroid function studies: No results for input(s): TSH, T4TOTAL, T3FREE, THYROIDAB in the last 72 hours.  Invalid input(s): FREET3 Anemia work up: Recent Labs    06/08/19 0908  VITAMINB12 277   Sepsis Labs: Recent Labs  Lab 06/03/19 2044 06/05/19 1346 06/06/19 2009  WBC 10.1 8.7 8.8    Microbiology Recent Results (from the past 240 hour(s))  Respiratory Panel by RT PCR (Flu A&B, Covid) - Nasopharyngeal Swab     Status: None   Collection Time: 06/05/19  1:46 PM   Specimen: Nasopharyngeal Swab  Result Value Ref Range Status   SARS Coronavirus 2 by RT PCR NEGATIVE NEGATIVE Final    Comment: (NOTE) SARS-CoV-2 target nucleic acids are NOT DETECTED. The SARS-CoV-2 RNA is generally detectable in upper respiratoy specimens during the acute phase of infection. The  lowest concentration of SARS-CoV-2 viral copies this assay can detect is 131 copies/mL. A negative result does not preclude SARS-Cov-2 infection and should not be used as the sole basis for treatment or other patient management decisions. A negative result may occur with  improper specimen collection/handling, submission of specimen other than nasopharyngeal swab, presence of viral mutation(s) within the areas targeted by this assay, and inadequate number of viral copies (<131 copies/mL). A negative result must be combined with clinical observations, patient history, and epidemiological information. The expected result is Negative. Fact Sheet for Patients:  PinkCheek.be Fact Sheet for Healthcare Providers:  GravelBags.it This test is not yet ap proved or cleared by the [place] FDA and  has been authorized for detection  and/or diagnosis of SARS-CoV-2 by FDA under an Emergency Use Authorization (EUA). This EUA will remain  in effect (meaning this test can be used) for the duration of the COVID-19 declaration under Section 564(b)(1) of the Act, 21 U.S.C. section 360bbb-3(b)(1), unless the authorization is terminated or revoked sooner.    Influenza A by PCR NEGATIVE NEGATIVE Final   Influenza B by PCR NEGATIVE NEGATIVE Final    Comment: (NOTE) The Xpert Xpress SARS-CoV-2/FLU/RSV assay is intended as an aid in  the diagnosis of influenza from Nasopharyngeal swab specimens and  should not be used as a sole basis for treatment. Nasal washings and  aspirates are unacceptable for Xpert Xpress SARS-CoV-2/FLU/RSV  testing. Fact Sheet for Patients: PinkCheek.be Fact Sheet for Healthcare Providers: GravelBags.it This test is not yet approved or cleared by the [place] FDA and  has been authorized for detection and/or diagnosis of SARS-CoV-2 by  FDA under an Emergency  Use Authorization (EUA). This EUA will remain  in effect (meaning this test can be used) for the duration of the  Covid-19 declaration under Section 564(b)(1) of the Act, 21  U.S.C. section 360bbb-3(b)(1), unless the authorization is  terminated or revoked. Performed at [facility], [place] [address], [place], [place] [address]   SARS CORONAVIRUS 2 (TAT 6-24 HRS) Nasopharyngeal Nasopharyngeal Swab     Status: None   Collection Time: 06/07/19 12:02 PM   Specimen: Nasopharyngeal Swab  Result Value Ref Range Status   SARS Coronavirus 2 NEGATIVE NEGATIVE Final    Comment: (NOTE) SARS-CoV-2 target nucleic acids are NOT DETECTED. The SARS-CoV-2 RNA is generally detectable in upper and lower respiratory specimens during the acute phase of infection. Negative results do not preclude SARS-CoV-2 infection, do not rule out co-infections with other pathogens, and should not be used as the sole basis for treatment or other patient management decisions. Negative results must be combined with clinical observations, patient history, and epidemiological information. The expected result is Negative. Fact Sheet for Patients: SugarRoll.be Fact Sheet for Healthcare Providers: https://www.woods-mathews.com/ This test is not yet approved or cleared by the [place] FDA and  has been authorized for detection and/or diagnosis of SARS-CoV-2 by FDA under an Emergency Use Authorization (EUA). This EUA will remain  in effect (meaning this test can be used) for the duration of the COVID-19 declaration under Section 56 4(b)(1) of the Act, 21 U.S.C. section 360bbb-3(b)(1), unless the authorization is terminated or revoked sooner. Performed at [facility], [place] [address] [place], [place] [address]     Procedures and diagnostic studies:  CT ANGIO HEAD W OR WO CONTRAST  Result Date: 06/07/2019 CLINICAL DATA:  Generalized weakness and gait  disturbance. Confusion. Small right occipital cortical infarctions by MRI. EXAM: CT ANGIOGRAPHY HEAD AND NECK TECHNIQUE: Multidetector CT imaging of the head and neck was performed using the standard protocol during bolus administration of intravenous contrast. Multiplanar CT image reconstructions and MIPs were obtained to evaluate the vascular anatomy. Carotid stenosis measurements (when applicable) are obtained utilizing NASCET criteria, using the distal internal carotid diameter as the denominator. CONTRAST:  75 cc Omnipaque 350 COMPARISON:  MRI same day FINDINGS: CT HEAD FINDINGS Brain: Age related atrophy and chronic small-vessel ischemic change of the white matter. Small infarctions in the right occipital lobe shown by MRI are not visible by CT. No mass lesion, hemorrhage, hydrocephalus or extra-axial collection. Vascular: There is atherosclerotic calcification of the major vessels at the base of the brain. Skull: Negative Sinuses: Clear/normal  Orbits: None Review of the MIP images confirms the above findings CTA NECK FINDINGS Aortic arch: Aortic atherosclerosis. No aneurysm or dissection. Branching pattern is normal. No flow limiting origin stenosis. Right carotid system: Common carotid artery is widely patent to the bifurcation. There is soft and calcified plaque at the carotid bifurcation and ICA bulb. Minimal diameter in the ICA bulb is 3.5 mm. Compared to a more distal cervical ICA diameter of 5 mm, this indicates a 30% stenosis. Left carotid system: Common carotid artery widely patent to the bifurcation region. Soft and calcified plaque at the carotid bifurcation and ICA bulb. No stenosis compared to the more distal cervical ICA. Vertebral arteries: Right vertebral artery is dominant. 30% stenosis of the proximal 1 cm of the vessel. Calcified plaque affecting the proximal 2 cm of the vessel, with an additional 30% stenosis. Beyond that, the vessel is widely patent through the cervical region to the  foramen magnum. Non dominant left vertebral artery is a small vessel, widely patent at its origin and through the cervical region to the foramen magnum. Skeleton: Mild cervical spondylosis. Other neck: No mass or lymphadenopathy. Upper chest: Ordinary mild apical scarring and emphysema. No active process. Review of the MIP images confirms the above findings CTA HEAD FINDINGS Anterior circulation: Both internal carotid arteries are patent through the skull base and siphon regions. There is ordinary siphon atherosclerotic calcification but without stenosis greater than 30%. Right common carotid artery supplies the middle cerebral artery and both anterior cerebral arteries. The A1 segment on the left is very diminutive. There is atherosclerotic irregularity and mild narrowing of the M1 segment, but without stenosis greater than 30%. More distal ACA and MCA branch vessels do show atherosclerotic irregularity. Left internal carotid artery supplies the left middle cerebral artery and a small A1 segment as noted above. There is atherosclerotic irregularity of the M1 and M2 branches as well as the small A1 segment. No severe stenosis. Posterior circulation: Dominant right vertebral artery shows mild irregularity in the V4 segment but no stenosis. This vessel is widely patent to the basilar. No basilar stenosis. Tiny left vertebral artery supplies PICA and is then occluded. Anterior inferior cerebellar arteries show flow. Superior cerebellar arteries show flow. Both posterior cerebral arteries show flow. There are patent bilateral posterior communicating arteries. PCA branch vessels show narrowing and irregularity but no sign of frank occlusion or severe proximal stenosis. Venous sinuses: Patent and normal. Anatomic variants: None other significant. Review of the MIP images confirms the above findings IMPRESSION: No acute large or medium vessel occlusion. Aortic atherosclerosis.  No aneurysm or dissection. Carotid  bifurcation atherosclerosis. No stenosis on the left. 30% ICA stenosis on the right. Dominant right vertebral artery shows considerable atherosclerotic change in its proximal 1-2 cm, with narrowing and irregularity. Serial 30% stenoses in that region. This could be a source of embolic disease however. Tiny left vertebral artery is patent to PICA but occluded beyond PICA. The age of this occlusion is indeterminate. It could be chronic or recent. In any case, the right vertebral artery gives ample supply to the basilar. No basilar stenosis. No posterior circulation branch vessel occlusion, with specific attention to the right PCA. Distal vessel atherosclerotic irregularity is seen diffusely. Intracranial anterior circulation shows atherosclerotic change in the carotid siphon regions but no stenosis greater than 30%. There is atherosclerotic irregularity and narrowing throughout the anterior and middle cerebral vessels, but without severe proximal flow limiting stenosis or any evidence of large or medium vessel occlusion. Electronically  Signed   By: Wahlert Chimes M.D.   On: 06/07/2019 13:33   CT ANGIO NECK W OR WO CONTRAST  Result Date: 06/07/2019 CLINICAL DATA:  Generalized weakness and gait disturbance. Confusion. Small right occipital cortical infarctions by MRI. EXAM: CT ANGIOGRAPHY HEAD AND NECK TECHNIQUE: Multidetector CT imaging of the head and neck was performed using the standard protocol during bolus administration of intravenous contrast. Multiplanar CT image reconstructions and MIPs were obtained to evaluate the vascular anatomy. Carotid stenosis measurements (when applicable) are obtained utilizing NASCET criteria, using the distal internal carotid diameter as the denominator. CONTRAST:  75 cc Omnipaque 350 COMPARISON:  MRI same day FINDINGS: CT HEAD FINDINGS Brain: Age related atrophy and chronic small-vessel ischemic change of the white matter. Small infarctions in the right occipital lobe shown by  MRI are not visible by CT. No mass lesion, hemorrhage, hydrocephalus or extra-axial collection. Vascular: There is atherosclerotic calcification of the major vessels at the base of the brain. Skull: Negative Sinuses: Clear/normal Orbits: None Review of the MIP images confirms the above findings CTA NECK FINDINGS Aortic arch: Aortic atherosclerosis. No aneurysm or dissection. Branching pattern is normal. No flow limiting origin stenosis. Right carotid system: Common carotid artery is widely patent to the bifurcation. There is soft and calcified plaque at the carotid bifurcation and ICA bulb. Minimal diameter in the ICA bulb is 3.5 mm. Compared to a more distal cervical ICA diameter of 5 mm, this indicates a 30% stenosis. Left carotid system: Common carotid artery widely patent to the bifurcation region. Soft and calcified plaque at the carotid bifurcation and ICA bulb. No stenosis compared to the more distal cervical ICA. Vertebral arteries: Right vertebral artery is dominant. 30% stenosis of the proximal 1 cm of the vessel. Calcified plaque affecting the proximal 2 cm of the vessel, with an additional 30% stenosis. Beyond that, the vessel is widely patent through the cervical region to the foramen magnum. Non dominant left vertebral artery is a small vessel, widely patent at its origin and through the cervical region to the foramen magnum. Skeleton: Mild cervical spondylosis. Other neck: No mass or lymphadenopathy. Upper chest: Ordinary mild apical scarring and emphysema. No active process. Review of the MIP images confirms the above findings CTA HEAD FINDINGS Anterior circulation: Both internal carotid arteries are patent through the skull base and siphon regions. There is ordinary siphon atherosclerotic calcification but without stenosis greater than 30%. Right common carotid artery supplies the middle cerebral artery and both anterior cerebral arteries. The A1 segment on the left is very diminutive. There is  atherosclerotic irregularity and mild narrowing of the M1 segment, but without stenosis greater than 30%. More distal ACA and MCA branch vessels do show atherosclerotic irregularity. Left internal carotid artery supplies the left middle cerebral artery and a small A1 segment as noted above. There is atherosclerotic irregularity of the M1 and M2 branches as well as the small A1 segment. No severe stenosis. Posterior circulation: Dominant right vertebral artery shows mild irregularity in the V4 segment but no stenosis. This vessel is widely patent to the basilar. No basilar stenosis. Tiny left vertebral artery supplies PICA and is then occluded. Anterior inferior cerebellar arteries show flow. Superior cerebellar arteries show flow. Both posterior cerebral arteries show flow. There are patent bilateral posterior communicating arteries. PCA branch vessels show narrowing and irregularity but no sign of frank occlusion or severe proximal stenosis. Venous sinuses: Patent and normal. Anatomic variants: None other significant. Review of the MIP images confirms the  above findings IMPRESSION: No acute large or medium vessel occlusion. Aortic atherosclerosis.  No aneurysm or dissection. Carotid bifurcation atherosclerosis. No stenosis on the left. 30% ICA stenosis on the right. Dominant right vertebral artery shows considerable atherosclerotic change in its proximal 1-2 cm, with narrowing and irregularity. Serial 30% stenoses in that region. This could be a source of embolic disease however. Tiny left vertebral artery is patent to PICA but occluded beyond PICA. The age of this occlusion is indeterminate. It could be chronic or recent. In any case, the right vertebral artery gives ample supply to the basilar. No basilar stenosis. No posterior circulation branch vessel occlusion, with specific attention to the right PCA. Distal vessel atherosclerotic irregularity is seen diffusely. Intracranial anterior circulation shows  atherosclerotic change in the carotid siphon regions but no stenosis greater than 30%. There is atherosclerotic irregularity and narrowing throughout the anterior and middle cerebral vessels, but without severe proximal flow limiting stenosis or any evidence of large or medium vessel occlusion. Electronically Signed   By: Rudden Chimes M.D.   On: 06/07/2019 13:33   MR BRAIN WO CONTRAST  Result Date: 06/07/2019 CLINICAL DATA:  Stroke.  Ataxia. EXAM: MRI HEAD WITHOUT CONTRAST TECHNIQUE: Multiplanar, multiecho pulse sequences of the brain and surrounding structures were obtained without intravenous contrast. COMPARISON:  CT head 06/03/2019 FINDINGS: Brain: Several small acute infarcts in the right occipital lobe. These appear to be cortical infarct. Otherwise no acute infarct. Generalized atrophy. Mild chronic ischemic changes in the white matter. Negative for hemorrhage mass or hydrocephalus. Vascular: Normal arterial flow voids. Skull and upper cervical spine: Negative Sinuses/Orbits: Paranasal sinuses clear.  Bilateral cataract surgery Other: None IMPRESSION: Several small cortical infarct right occipital lobe. Atrophy and chronic microvascular ischemia. Electronically Signed   By: Franchot Gallo M.D.   On: 06/07/2019 10:09   ECHOCARDIOGRAM COMPLETE  Result Date: 06/07/2019   ECHOCARDIOGRAM REPORT   Patient Name:   Tiffany Charles Date of Exam: 06/07/2019 Medical Rec #:  [id number]      Height:       63.0 in Accession #:    [id number]     Weight:       118.6 lb Date of Birth:  Aug 23, 1929     BSA:          1.55 m Patient Age:    45 years       BP:           169/95 mmHg Patient Gender: F              HR:           75 bpm. Exam Location:  Inpatient Procedure: 2D Echo Indications:    stroke 434.91  History:        Patient has prior history of Echocardiogram examinations, most                 recent 09/17/2016. CHF, CAD, COPD, Arrythmias:Atrial                 Fibrillation; Risk Factors:Hypertension, Dyslipidemia and  Former                 Smoker. Mild pulmonary hypertension. former smoker.  Sonographer:    Jannett Celestine RDCS (AE) Referring Phys: [place]  1. Left ventricular ejection fraction, by visual estimation, is 60 to 65%. The left ventricle has normal function. There is no left ventricular hypertrophy.  2. Left ventricular diastolic function could not be evaluated.  3.  The left ventricle has no regional wall motion abnormalities.  4. Global right ventricle has normal systolic function.The right ventricular size is normal. No increase in right ventricular wall thickness.  5. Left atrial size was severely dilated.  6. Right atrial size was severely dilated.  7. The mitral valve is normal in structure. Trivial mitral valve regurgitation. No evidence of mitral stenosis.  8. Moderate to severe tricuspid stenosis.  9. The tricuspid valve is normal in structure. 10. The aortic valve is normal in structure. Aortic valve regurgitation is not visualized. No evidence of aortic valve sclerosis or stenosis. 11. The pulmonic valve was normal in structure. Pulmonic valve regurgitation is not visualized. 12. Moderately elevated pulmonary artery systolic pressure. 13. The inferior vena cava is dilated in size with <50% respiratory variability, suggesting right atrial pressure of 15 mmHg. 14. No significant change from 09/17/2016 study. 15. Prior images reviewed side by side. FINDINGS  Left Ventricle: Left ventricular ejection fraction, by visual estimation, is 60 to 65%. The left ventricle has normal function. The left ventricle has no regional wall motion abnormalities. The left ventricular internal cavity size was the left ventricle is normal in size. There is no left ventricular hypertrophy. The left ventricular diastology could not be evaluated due to atrial fibrillation. Left ventricular diastolic function could not be evaluated. Normal left atrial pressure. Right Ventricle: The right ventricular size is  normal. No increase in right ventricular wall thickness. Global RV systolic function is has normal systolic function. The tricuspid regurgitant velocity is 2.90 m/s, and with an assumed right atrial pressure  of 15 mmHg, the estimated right ventricular systolic pressure is moderately elevated at 48.6 mmHg. Left Atrium: Left atrial size was severely dilated. Right Atrium: Right atrial size was severely dilated Prominent Eustachian valve. Pericardium: There is no evidence of pericardial effusion. Mitral Valve: The mitral valve is normal in structure. There is mild thickening of the mitral valve leaflet(s). Trivial mitral valve regurgitation. No evidence of mitral valve stenosis by observation. Tricuspid Valve: The tricuspid valve is normal in structure. Tricuspid valve regurgitation moderate. Moderate to severe tricuspid stenosis is present. Aortic Valve: The aortic valve is normal in structure. Aortic valve regurgitation is not visualized. The aortic valve is structurally normal, with no evidence of sclerosis or stenosis. Pulmonic Valve: The pulmonic valve was normal in structure. Pulmonic valve regurgitation is not visualized. Pulmonic regurgitation is not visualized. Aorta: The aortic root, ascending aorta and aortic arch are all structurally normal, with no evidence of dilitation or obstruction. Venous: The inferior vena cava is dilated in size with less than 50% respiratory variability, suggesting right atrial pressure of 15 mmHg. IAS/Shunts: No atrial level shunt detected by color flow Doppler. There is no evidence of a patent foramen ovale. No ventricular septal defect is seen or detected. There is no evidence of an atrial septal defect.  LEFT VENTRICLE PLAX 2D LVIDd:         3.80 cm LVIDs:         2.20 cm LV PW:         1.00 cm LV IVS:        0.80 cm LVOT diam:     1.80 cm LV SV:         46 ml LV SV Index:   29.57 LVOT Area:     2.54 cm  LEFT ATRIUM             Index       RIGHT ATRIUM  Index LA  diam:        4.70 cm 3.04 cm/m  RA Area:     22.00 cm LA Vol (A2C):   34.8 ml 22.47 ml/m RA Volume:   68.70 ml  44.36 ml/m LA Vol (A4C):   60.7 ml 39.20 ml/m LA Biplane Vol: 45.7 ml 29.51 ml/m  AORTIC VALVE LVOT Vmax:   52.20 cm/s LVOT Vmean:  38.000 cm/s LVOT VTI:    0.113 m  AORTA Ao Root diam: 2.80 cm MITRAL VALVE                       TRICUSPID VALVE MV Area (PHT): 2.95 cm            TR Peak grad:   33.6 mmHg MV PHT:        74.53 msec          TR Vmax:        307.00 cm/s MV Decel Time: 257 msec MV E velocity: 81.40 cm/s 103 cm/s SHUNTS                                    Systemic VTI:  0.11 m                                    Systemic Diam: 1.80 cm  Dani Gobble Croitoru MD Electronically signed by Sanda Klein MD Signature Date/Time: 06/07/2019/6:02:42 PM    Final     Medications:   . atorvastatin  40 mg Oral q1800  . nicotine  14 mg Transdermal Daily  . Rivaroxaban  15 mg Oral QAC supper   Continuous Infusions: . sodium chloride 50 mL/hr at 06/07/19 1944     LOS: 1 day   Geradine Girt  [facility]   How to contact the [facility] Attending or Consulting provider [place] or covering provider during after hours [place], for this patient?  1. Check the care team in [facility] and look for a) attending/consulting TR[facility] provider listed and b) the [facility] team listed 2. Log into [email or web address] and use [place]'s universal password to access. If you do not have the password, please contact the hospital operator. 3. Locate the [facility] provider you are looking for under [facility] and page to a number that you can be directly reached. 4. If you still have difficulty reaching the provider, please page the [facility] (Director on Call) for the Hospitalists listed on amion for assistance.  06/08/2019, 11:25 AM

## 2019-06-08 NOTE — Evaluation (Addendum)
Speech Language Pathology Evaluation Patient Details Name: Tiffany Charles MRN: [id number] DOB: 14-Jan-1930 Today's Date: 06/08/2019 Time: 5366-4403 SLP Time Calculation (min) (ACUTE ONLY): 18 min  Problem List:  Patient Active Problem List   Diagnosis Date Noted  . DNR (do not resuscitate) 06/08/2019  . Occipital stroke ([place]) 06/07/2019  . Squamous cell carcinoma, leg, unspecified laterality 06/08/2018  . Basal cell carcinoma (BCC) of nasal tip 06/08/2018  . Senile dementia without behavioral disturbance ([place]) 04/29/2017  . Frequent PVCs 09/17/2016  . Tricuspid regurgitation 09/17/2016  . Mild pulmonary hypertension ([place]) 09/17/2016  . Chronic diastolic (congestive) heart failure ([place])   . Essential hypertension   . Generalized OA 12/28/2014  . Routine general medical examination at a health care facility 11/09/2013  . Depression, major ([place]) 11/07/2013  . Chronic anticoagulation- Xarelto 12/28/2012  . Permanent atrial fibrillation 10/28/2012  . COPD - PFTs in June 2014 with MET test 10/28/2012  . CAD, RCA BMS '09, 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 low risk Myoview Feb 2013 10/28/2012  . Hyperlipidemia 10/28/2012   Past Medical History:  Past Medical History:  Diagnosis Date  . Angina pectoris   . Arthritis   . Chronic anticoagulation    Xarelto  . Chronic diastolic (congestive) heart failure ([place])   . COPD (chronic obstructive pulmonary disease) ([place])   . Coronary artery disease    a. s/p RCA stent 2008. b. cath 02/2009: 30% mLAD, 90% AV groove cx unchanged from prior, 50-60% RCA, EF 60% -> med rx.  . Depression   . Essential hypertension   . Hyperlipidemia   . Mild pulmonary hypertension ([place]) 09/17/2016  . Permanent atrial fibrillation ([place])   . Skin cancer Jan 2013   squamous cell- removed  . Tricuspid regurgitation 09/17/2016   Past Surgical History:  Past Surgical History:  Procedure Laterality Date  .  CATARACTS REMOVED    . ABDOMINAL HYSTERECTOMY     . CARDIAC CATHETERIZATION  03/22/2009   Started medical therapy  . CARDIOVERSION  06/05/2009   3 attempts, last attempt successful  . CORONARY ANGIOPLASTY WITH STENT PLACEMENT  2009   RCA Vision stent  . HIP SURGERY    . MELANOMA EXCISION  06/06/2011   Procedure: MELANOMA EXCISION;  Surgeon: Adin Hector, MD;  Location: [facility] ORS;  Service: General;  Laterality: Left;  re excision squamous cell lesion left chest wall    HPI:  Tiffany Charles is an 86 y.o. female with a past medical history of tricuspid regurgitation, permanent atrial fibrillation on Xarelto, hyperlipidemia, essential hypertension, CAD.  Patient presented to the ED for generalized weakness.  On MRI patient was found to have acute right occipital lobe strokes. Pt with hx Alzheimer's.  Assessment / Plan / Recommendation Clinical Impression   Patient seen at bedside for speech/language evaluation. Patient presents with mild/mod expressive language deficits. Patient oriented to self and place ("[facility]"). Patient able to state correct month when asked for her birthday, but unable to state year or her current age. Patient reports she lives with her son and sister. Family not available at bedside to provide information regarding her baseline level of function, but suspect she is at or near baseline function PTA. Patient able to answer simple and complex yes/no questions and follow 1,2,3 simple verbal commands adequately. Automatic speech intact. Repetition intact. Informal brief reading assessment revealed reading at the word level to be [facility].Patient demonstrated difficulty with immediate and delayed verbal recall.  In confrontation naming task, patient  able to name 5/9 objects in room. She demonstrated semantic paraphasias (calling the window "door" and curtain "ribbon") and awareness of errors. In picture confrontation naming task, patient able to name 2/8 pictures. She benefited from phonemic and semantic cues.   Recommend  [place] services if patient returns home or ST services at the next venue of care. ST needs can be addressed at the next level of care, ST to sign off for now. Please re-consult if new needs arise.    SLP Assessment  SLP Recommendation/Assessment: All further Speech Lanaguage Pathology  needs can be addressed in the next venue of care SLP Visit Diagnosis: Cognitive communication deficit (R41.841);Aphasia (R47.01)    Follow Up Recommendations  Home health SLP;24 hour supervision/assistance    Frequency and Duration           SLP Evaluation Cognition  Overall Cognitive Status: No family/caregiver present to determine baseline cognitive functioning Arousal/Alertness: Awake/alert Orientation Level: Oriented to person;Oriented to place Memory: Impaired       Comprehension  Auditory Comprehension Overall Auditory Comprehension: Appears within functional limits for tasks assessed Yes/No Questions: Within Functional Limits Commands: Within Functional Limits Conversation: Simple Reading Comprehension Reading Status: Within funtional limits    Expression Expression Primary Mode of Expression: Verbal Verbal Expression Overall Verbal Expression: Impaired at baseline Automatic Speech: Counting Level of Generative/Spontaneous Verbalization: Conversation Repetition: No impairment Naming: Impairment Confrontation: Impaired Convergent: 75-100% accurate Verbal Errors: Semantic paraphasias Written Expression Dominant Hand: Right Written Expression: Not tested   Oral / Motor  Motor Speech Overall Motor Speech: Appears within functional limits for tasks assessed Respiration: Within functional limits Phonation: Breathy Intelligibility: Intelligible Motor Planning: Witnin functional limits Motor Speech Errors: Not applicable   GO                   Marina Goodell, M.Ed., CCC-SLP Speech Therapy Acute Rehabilitation [phone]  Marina Goodell 06/08/2019, 3:03 PM

## 2019-06-08 NOTE — Progress Notes (Signed)
Occupational Therapy Evaluation Patient Details Name: Tiffany Charles MRN: [id number] DOB: 1930/03/29 Today's Date: 06/08/2019    History of Present Illness Tiffany Charles is an 86 y.o. female with a past medical history of tricuspid regurgitation, permanent atrial fibrillation on Xarelto, hyperlipidemia, essential hypertension, CAD.  Patient has been admitted to the ED secondary to generalized weakness.  On MRI patient was found to have acute right occipital lobe strokes.   Clinical Impression   PTA, pt was living at home with her son, pt with history of Alzheimer's at baseline, unsure level of assistance required at baseline. Pt reports she was independent with ADL/IADL and functional mobiltiy. Pt currently requires minguard-minA for ADL completion at sink level and functional mobility in the room. Anticipate pt is close to her baseline. Due to decline in current level of function, pt would benefit from acute OT to address established goals to facilitate safe D/C to venue listed below. At this time, recommend pt return home to most familiar environment as long as son is able to provide appropriate level of assistance. Recommend HHOT follow-up. Will continue to follow acutely.     Follow Up Recommendations  Home health OT;Supervision/Assistance - 24 hour    Equipment Recommendations  3 in 1 bedside commode    Recommendations for Other Services       Precautions / Restrictions Precautions Precautions: Fall Restrictions Weight Bearing Restrictions: No      Mobility Bed Mobility Overal bed mobility: Needs Assistance Bed Mobility: Supine to Sit     Supine to sit: Min guard     General bed mobility comments: minguard for safety  Transfers Overall transfer level: Needs assistance Equipment used: Rolling walker (2 wheeled) Transfers: Sit to/from Stand Sit to Stand: Min guard         General transfer comment: minguard for safety    Balance Overall balance assessment:  Needs assistance Sitting-balance support: No upper extremity supported;Feet supported Sitting balance-Leahy Scale: Fair Sitting balance - Comments: able to cross legs and bend over to access foot on both sides;no loss of balance   Standing balance support: No upper extremity supported;During functional activity Standing balance-Leahy Scale: Fair Standing balance comment: mina for more dynamic reaching during ADL tasks                           ADL either performed or assessed with clinical judgement   ADL Overall ADL's : Needs assistance/impaired Eating/Feeding: Set up;Sitting   Grooming: Minimal assistance;Standing Grooming Details (indicate cue type and reason): washed hands at sink level;minA fro stabiltiy and vc to locate soap and towel Upper Body Bathing: Min guard;Sitting   Lower Body Bathing: Sit to/from stand;Min guard Lower Body Bathing Details (indicate cue type and reason): minguard for stabiltiy not physical assistance provided Upper Body Dressing : Min guard;Sitting   Lower Body Dressing: Min guard;Sit to/from stand Lower Body Dressing Details (indicate cue type and reason): pt adjusted socks sitting EOB;crossed leg and bent down to access foot Toilet Transfer: Minimal assistance;RW;Ambulation Toilet Transfer Details (indicate cue type and reason): minA for navigating in the room; Toileting- Clothing Manipulation and Hygiene: Minimal assistance;Sit to/from stand       Functional mobility during ADLs: Min guard;Minimal assistance;Rolling walker General ADL Comments: pt limited by decreased activity tolerance, cognitive limitations, and generalized weakness;increased support for more dynamic balance activities where pt requires reaching outside base of support     Vision   Vision Assessment?: Yes Eye Alignment:  Within Functional Limits Ocular Range of Motion: Within Functional Limits Alignment/Gaze Preference: Within Defined Limits Tracking/Visual  Pursuits: Requires cues, head turns, or add eye shifts to track Visual Fields: Left visual field deficit Additional Comments: pt appears to have difficulty locating items in L inferior field of vision;able to identify objects in superior field of vision; bumped into counter on her left during mobiltiy     Perception     Praxis      Pertinent Vitals/Pain Pain Assessment: No/denies pain     Hand Dominance Right   Extremity/Trunk Assessment Upper Extremity Assessment Upper Extremity Assessment: Generalized weakness   Lower Extremity Assessment Lower Extremity Assessment: Defer to PT evaluation   Cervical / Trunk Assessment Cervical / Trunk Assessment: Normal   Communication Communication Communication: No difficulties   Cognition Arousal/Alertness: Awake/alert Behavior During Therapy: WFL for tasks assessed/performed Overall Cognitive Status: No family/caregiver present to determine baseline cognitive functioning Area of Impairment: Orientation;Attention;Memory;Following commands;Safety/judgement;Awareness;Problem solving                 Orientation Level: Disoriented to;Person;Place;Time;Situation Current Attention Level: Focused Memory: Decreased short-term memory Following Commands: Follows one step commands with increased time Safety/Judgement: Decreased awareness of safety;Decreased awareness of deficits Awareness: Intellectual Problem Solving: Slow processing;Difficulty sequencing;Requires verbal cues;Requires tactile cues General Comments: pt unable to provide correct year/month despite 3 option choices (states its April 2000) pt reports we are at her daughter in [place];requires vc for locating items at sink level;requires increased time for processing information;Alzheimer's at baseline   General Comments  Pt in A-fib throughout, HR ranging 107-124bpm during session; BP 131/88    Exercises     Shoulder Instructions      Home Living Family/patient  expects to be discharged to:: Private residence Living Arrangements: Children(son) Available Help at Discharge: Family;Available 24 hours/day Type of Home: House Home Access: Stairs to enter [employer] of Steps: 3   Home Layout: Two level;Able to live on main level with bedroom/bathroom     Bathroom Shower/Tub: Occupational psychologist: Standard Bathroom Accessibility: Yes How Accessible: Accessible via walker Home Equipment: Shower seat;Walker - 2 wheels;Wheelchair - manual   Additional Comments: per chart, pt lives with her son who cares for her and her 64 year old sister      Prior Functioning/Environment Level of Independence: Needs assistance    ADL's / Homemaking Assistance Needed: pt reports independence with ADL;reports she was still driving until hospitilization   Comments: unsure accuracy of information provided by pt due to cognitive limitations        OT Problem List: Decreased activity tolerance;Impaired balance (sitting and/or standing);Impaired vision/perception;Decreased safety awareness;Decreased cognition;Decreased knowledge of precautions;Cardiopulmonary status limiting activity      OT Treatment/Interventions: Self-care/ADL training;Therapeutic exercise;Energy conservation;Therapeutic activities;Cognitive remediation/compensation;Visual/perceptual remediation/compensation;Patient/family education;Balance training    OT Goals(Current goals can be found in the care plan section) Acute Rehab OT Goals Patient Stated Goal: pt did not state OT Goal Formulation: With patient Time For Goal Achievement: 06/22/19 Potential to Achieve Goals: Good ADL Goals Pt Will Perform Grooming: with supervision;standing Pt Will Perform Lower Body Dressing: with supervision;sit to/from stand Pt Will Transfer to Toilet: with supervision;ambulating Additional ADL Goal #1: Pt/caregiver will demonstrate modified independence with medication management.  OT  Frequency: Min 2X/week   Barriers to D/C:            Co-evaluation PT/OT/SLP Co-Evaluation/Treatment: Yes Reason for Co-Treatment: To address functional/ADL transfers;For patient/therapist safety   OT goals addressed during session: ADL's and self-care  AM-PAC OT "6 Clicks" Daily Activity     Outcome Measure Help from another person eating meals?: A Little Help from another person taking care of personal grooming?: A Little Help from another person toileting, which includes using toliet, bedpan, or urinal?: A Little Help from another person bathing (including washing, rinsing, drying)?: A Little Help from another person to put on and taking off regular upper body clothing?: A Little Help from another person to put on and taking off regular lower body clothing?: A Little 6 Click Score: 18   End of Session Equipment Utilized During Treatment: Gait belt;Rolling walker Nurse Communication: Mobility status  Activity Tolerance: Patient tolerated treatment well Patient left: in bed;with call bell/phone within reach;with nursing/sitter in room  OT Visit Diagnosis: Unsteadiness on feet (R26.81);Other abnormalities of gait and mobility (R26.89);Muscle weakness (generalized) (M62.81);Other symptoms and signs involving cognitive function;Low vision, both eyes (H54.2)                Time: [id number] OT Time Calculation (min): 24 min Charges:  OT General Charges $OT Visit: 1 Visit OT Evaluation $OT Eval Moderate Complexity: [place] OTR/L Acute Rehabilitation Services Office: [place] 06/08/2019, 1:47 PM

## 2019-06-08 NOTE — Discharge Summary (Signed)
Physician Discharge Summary  Tiffany Charles [name]:[id number] DOB: [birth year] DOA: 06/06/2019  PCP: [name], MD  Admit date: 06/06/2019 Discharge date: 06/08/2019  Admitted From: home Discharge disposition: home   Recommendations for Outpatient Follow-Up:   1. No driving 2. Needs compliance with medications-- son to help 3. Home health PT/OT 4. Smoking cessation   Discharge Diagnosis:   Principal Problem:   Occipital stroke [facility]) Active Problems:   Permanent atrial fibrillation   COPD - PFTs in June 2014 with MET test   Hyperlipidemia   Chronic anticoagulation- Xarelto   Chronic diastolic (congestive) heart failure (HCC)   Essential hypertension   Senile dementia without behavioral disturbance ([place])   DNR (do not resuscitate)    Discharge Condition: Improved.  Diet recommendation: Low sodium, heart healthy  Wound care: None.  Code status: Full.   History of Present Illness:   Tiffany Charles is a 86 y.o. female with medical history significant of CAD s/p stent; afib; HTN; HLD; depression; dementia; COPD; and chronic diastolic CHF presenting with generalized weakness.  "I just wanted to see the doctor."  She is oriented only to person.  I called and spoke with her son.  He reports that in the last week or so she has been weak with standing, hard to get up.  She reported feeling fine.  She has Alzheimer's and is about the same as usual.  She is on afib and takes Xarelto - she has missed some doses, probably a couple of months worse.  He has had her back on the last 2 weeks and has missed a few doses.  She also has missed her cholesterol medication.  She continues to smoke.  He would not consider SNF rehab but would consider CIR.   Hospital Course by Problem:   Stroke:  R PCA and MCA/PCA infarcts embolic secondary to known atrial fibrillation not compliant with xarelto  CT Head and Spine 1/8 no acute injury. Small vessel disease.   MRI 1/12 several small  R occipital cortical infarcts. Small vessel disease. Atrophy.   CTA head & neck no LVO. Aortic atherosclerosis. R ICA 30% stenosis. R VA w/ atherosclerosis and narrowing. Generalized atherosclerosis.   2D Echo EF 60-65%. No source of embolus   LDL 114  HgbA1c 5.0  Prescribed Xarelto (rivaroxaban) daily 52m prior to admission - missed several months worth but back on x 2 weeks and missed only a few doses, now on Xarelto (rivaroxaban) daily 15 mg. Son is going to supervise pt meds at home from now on  Home health  Atrial Fibrillation  Home anticoagulation:  Xarelto (rivaroxaban) daily 15 mg prescribed - pt  missed several months worth but back on the past 2 weeks only missing a few doses  Continue Xarelto (rivaroxaban) daily at discharge  Son will provide supervision for meds   Hypertension  Resume home meds  Hyperlipidemia  Continue statin  Tobacco abuse  Encourage cessation    Medical Consultants:   neurology   Discharge Exam:   Vitals:   06/08/19 0345 06/08/19 0900  BP: (!) 149/73 128/76  Pulse: 100 84  Resp: 19 20  Temp: 98 F (36.7 C) 98.2 F (36.8 C)  SpO2: 98% 96%   Vitals:   06/07/19 2327 06/08/19 0000 06/08/19 0345 06/08/19 0900  BP: (!) 146/76 134/82 (!) 149/73 128/76  Pulse: 94 99 100 84  Resp: 18 16 19 20   Temp: (!) 97.5 F (36.4 C) 98 F (36.7  C) 98 F (36.7 C) 98.2 F (36.8 C)  TempSrc: Oral Oral Oral   SpO2: 97%  98% 96%    General exam: Appears calm and comfortable.  The results of significant diagnostics from this hospitalization (including imaging, microbiology, ancillary and laboratory) are listed below for reference.     Procedures and Diagnostic Studies:   CT ANGIO HEAD W OR WO CONTRAST  Result Date: 06/07/2019 CLINICAL DATA:  Generalized weakness and gait disturbance. Confusion. Small right occipital cortical infarctions by MRI. EXAM: CT ANGIOGRAPHY HEAD AND NECK TECHNIQUE: Multidetector CT imaging of the head and  neck was performed using the standard protocol during bolus administration of intravenous contrast. Multiplanar CT image reconstructions and MIPs were obtained to evaluate the vascular anatomy. Carotid stenosis measurements (when applicable) are obtained utilizing NASCET criteria, using the distal internal carotid diameter as the denominator. CONTRAST:  75 cc Omnipaque 350 COMPARISON:  MRI same day FINDINGS: CT HEAD FINDINGS Brain: Age related atrophy and chronic small-vessel ischemic change of the white matter. Small infarctions in the right occipital lobe shown by MRI are not visible by CT. No mass lesion, hemorrhage, hydrocephalus or extra-axial collection. Vascular: There is atherosclerotic calcification of the major vessels at the base of the brain. Skull: Negative Sinuses: Clear/normal Orbits: None Review of the MIP images confirms the above findings CTA NECK FINDINGS Aortic arch: Aortic atherosclerosis. No aneurysm or dissection. Branching pattern is normal. No flow limiting origin stenosis. Right carotid system: Common carotid artery is widely patent to the bifurcation. There is soft and calcified plaque at the carotid bifurcation and ICA bulb. Minimal diameter in the ICA bulb is 3.5 mm. Compared to a more distal cervical ICA diameter of 5 mm, this indicates a 30% stenosis. Left carotid system: Common carotid artery widely patent to the bifurcation region. Soft and calcified plaque at the carotid bifurcation and ICA bulb. No stenosis compared to the more distal cervical ICA. Vertebral arteries: Right vertebral artery is dominant. 30% stenosis of the proximal 1 cm of the vessel. Calcified plaque affecting the proximal 2 cm of the vessel, with an additional 30% stenosis. Beyond that, the vessel is widely patent through the cervical region to the foramen magnum. Non dominant left vertebral artery is a small vessel, widely patent at its origin and through the cervical region to the foramen magnum. Skeleton: Mild  cervical spondylosis. Other neck: No mass or lymphadenopathy. Upper chest: Ordinary mild apical scarring and emphysema. No active process. Review of the MIP images confirms the above findings CTA HEAD FINDINGS Anterior circulation: Both internal carotid arteries are patent through the skull base and siphon regions. There is ordinary siphon atherosclerotic calcification but without stenosis greater than 30%. Right common carotid artery supplies the middle cerebral artery and both anterior cerebral arteries. The A1 segment on the left is very diminutive. There is atherosclerotic irregularity and mild narrowing of the M1 segment, but without stenosis greater than 30%. More distal ACA and MCA branch vessels do show atherosclerotic irregularity. Left internal carotid artery supplies the left middle cerebral artery and a small A1 segment as noted above. There is atherosclerotic irregularity of the M1 and M2 branches as well as the small A1 segment. No severe stenosis. Posterior circulation: Dominant right vertebral artery shows mild irregularity in the V4 segment but no stenosis. This vessel is widely patent to the basilar. No basilar stenosis. Tiny left vertebral artery supplies PICA and is then occluded. Anterior inferior cerebellar arteries show flow. Superior cerebellar arteries show flow. Both posterior  cerebral arteries show flow. There are patent bilateral posterior communicating arteries. PCA branch vessels show narrowing and irregularity but no sign of frank occlusion or severe proximal stenosis. Venous sinuses: Patent and normal. Anatomic variants: None other significant. Review of the MIP images confirms the above findings IMPRESSION: No acute large or medium vessel occlusion. Aortic atherosclerosis.  No aneurysm or dissection. Carotid bifurcation atherosclerosis. No stenosis on the left. 30% ICA stenosis on the right. Dominant right vertebral artery shows considerable atherosclerotic change in its proximal 1-2  cm, with narrowing and irregularity. Serial 30% stenoses in that region. This could be a source of embolic disease however. Tiny left vertebral artery is patent to PICA but occluded beyond PICA. The age of this occlusion is indeterminate. It could be chronic or recent. In any case, the right vertebral artery gives ample supply to the basilar. No basilar stenosis. No posterior circulation branch vessel occlusion, with specific attention to the right PCA. Distal vessel atherosclerotic irregularity is seen diffusely. Intracranial anterior circulation shows atherosclerotic change in the carotid siphon regions but no stenosis greater than 30%. There is atherosclerotic irregularity and narrowing throughout the anterior and middle cerebral vessels, but without severe proximal flow limiting stenosis or any evidence of large or medium vessel occlusion. Electronically Signed   By: Hugill Chimes M.D.   On: 06/07/2019 13:33   CT ANGIO NECK W OR WO CONTRAST  Result Date: 06/07/2019 CLINICAL DATA:  Generalized weakness and gait disturbance. Confusion. Small right occipital cortical infarctions by MRI. EXAM: CT ANGIOGRAPHY HEAD AND NECK TECHNIQUE: Multidetector CT imaging of the head and neck was performed using the standard protocol during bolus administration of intravenous contrast. Multiplanar CT image reconstructions and MIPs were obtained to evaluate the vascular anatomy. Carotid stenosis measurements (when applicable) are obtained utilizing NASCET criteria, using the distal internal carotid diameter as the denominator. CONTRAST:  75 cc Omnipaque 350 COMPARISON:  MRI same day FINDINGS: CT HEAD FINDINGS Brain: Age related atrophy and chronic small-vessel ischemic change of the white matter. Small infarctions in the right occipital lobe shown by MRI are not visible by CT. No mass lesion, hemorrhage, hydrocephalus or extra-axial collection. Vascular: There is atherosclerotic calcification of the major vessels at the base of  the brain. Skull: Negative Sinuses: Clear/normal Orbits: None Review of the MIP images confirms the above findings CTA NECK FINDINGS Aortic arch: Aortic atherosclerosis. No aneurysm or dissection. Branching pattern is normal. No flow limiting origin stenosis. Right carotid system: Common carotid artery is widely patent to the bifurcation. There is soft and calcified plaque at the carotid bifurcation and ICA bulb. Minimal diameter in the ICA bulb is 3.5 mm. Compared to a more distal cervical ICA diameter of 5 mm, this indicates a 30% stenosis. Left carotid system: Common carotid artery widely patent to the bifurcation region. Soft and calcified plaque at the carotid bifurcation and ICA bulb. No stenosis compared to the more distal cervical ICA. Vertebral arteries: Right vertebral artery is dominant. 30% stenosis of the proximal 1 cm of the vessel. Calcified plaque affecting the proximal 2 cm of the vessel, with an additional 30% stenosis. Beyond that, the vessel is widely patent through the cervical region to the foramen magnum. Non dominant left vertebral artery is a small vessel, widely patent at its origin and through the cervical region to the foramen magnum. Skeleton: Mild cervical spondylosis. Other neck: No mass or lymphadenopathy. Upper chest: Ordinary mild apical scarring and emphysema. No active process. Review of the MIP images confirms the  above findings CTA HEAD FINDINGS Anterior circulation: Both internal carotid arteries are patent through the skull base and siphon regions. There is ordinary siphon atherosclerotic calcification but without stenosis greater than 30%. Right common carotid artery supplies the middle cerebral artery and both anterior cerebral arteries. The A1 segment on the left is very diminutive. There is atherosclerotic irregularity and mild narrowing of the M1 segment, but without stenosis greater than 30%. More distal ACA and MCA branch vessels do show atherosclerotic irregularity.  Left internal carotid artery supplies the left middle cerebral artery and a small A1 segment as noted above. There is atherosclerotic irregularity of the M1 and M2 branches as well as the small A1 segment. No severe stenosis. Posterior circulation: Dominant right vertebral artery shows mild irregularity in the V4 segment but no stenosis. This vessel is widely patent to the basilar. No basilar stenosis. Tiny left vertebral artery supplies PICA and is then occluded. Anterior inferior cerebellar arteries show flow. Superior cerebellar arteries show flow. Both posterior cerebral arteries show flow. There are patent bilateral posterior communicating arteries. PCA branch vessels show narrowing and irregularity but no sign of frank occlusion or severe proximal stenosis. Venous sinuses: Patent and normal. Anatomic variants: None other significant. Review of the MIP images confirms the above findings IMPRESSION: No acute large or medium vessel occlusion. Aortic atherosclerosis.  No aneurysm or dissection. Carotid bifurcation atherosclerosis. No stenosis on the left. 30% ICA stenosis on the right. Dominant right vertebral artery shows considerable atherosclerotic change in its proximal 1-2 cm, with narrowing and irregularity. Serial 30% stenoses in that region. This could be a source of embolic disease however. Tiny left vertebral artery is patent to PICA but occluded beyond PICA. The age of this occlusion is indeterminate. It could be chronic or recent. In any case, the right vertebral artery gives ample supply to the basilar. No basilar stenosis. No posterior circulation branch vessel occlusion, with specific attention to the right PCA. Distal vessel atherosclerotic irregularity is seen diffusely. Intracranial anterior circulation shows atherosclerotic change in the carotid siphon regions but no stenosis greater than 30%. There is atherosclerotic irregularity and narrowing throughout the anterior and middle cerebral  vessels, but without severe proximal flow limiting stenosis or any evidence of large or medium vessel occlusion. Electronically Signed   By: Pinney Chimes M.D.   On: 06/07/2019 13:33   MR BRAIN WO CONTRAST  Result Date: 06/07/2019 CLINICAL DATA:  Stroke.  Ataxia. EXAM: MRI HEAD WITHOUT CONTRAST TECHNIQUE: Multiplanar, multiecho pulse sequences of the brain and surrounding structures were obtained without intravenous contrast. COMPARISON:  CT head 06/03/2019 FINDINGS: Brain: Several small acute infarcts in the right occipital lobe. These appear to be cortical infarct. Otherwise no acute infarct. Generalized atrophy. Mild chronic ischemic changes in the white matter. Negative for hemorrhage mass or hydrocephalus. Vascular: Normal arterial flow voids. Skull and upper cervical spine: Negative Sinuses/Orbits: Paranasal sinuses clear.  Bilateral cataract surgery Other: None IMPRESSION: Several small cortical infarct right occipital lobe. Atrophy and chronic microvascular ischemia. Electronically Signed   By: Franchot Gallo M.D.   On: 06/07/2019 10:09   ECHOCARDIOGRAM COMPLETE  Result Date: 06/07/2019   ECHOCARDIOGRAM REPORT   Patient Name:   NOYA SANTARELLI Date of Exam: 06/07/2019 Medical Rec #:  [id number]      Height:       63.0 in Accession #:    [id number]     Weight:       118.6 lb Date of Birth:  [birth year]  BSA:          1.55 m Patient Age:    25 years       BP:           169/95 mmHg Patient Gender: F              HR:           75 bpm. Exam Location:  Inpatient Procedure: 2D Echo Indications:    stroke 434.91  History:        Patient has prior history of Echocardiogram examinations, most                 recent 09/17/2016. CHF, CAD, COPD, Arrythmias:Atrial                 Fibrillation; Risk Factors:Hypertension, Dyslipidemia and Former                 Smoker. Mild pulmonary hypertension. former smoker.  Sonographer:    Jannett Celestine RDCS (AE) Referring Phys: [place]  1. Left  ventricular ejection fraction, by visual estimation, is 60 to 65%. The left ventricle has normal function. There is no left ventricular hypertrophy.  2. Left ventricular diastolic function could not be evaluated.  3. The left ventricle has no regional wall motion abnormalities.  4. Global right ventricle has normal systolic function.The right ventricular size is normal. No increase in right ventricular wall thickness.  5. Left atrial size was severely dilated.  6. Right atrial size was severely dilated.  7. The mitral valve is normal in structure. Trivial mitral valve regurgitation. No evidence of mitral stenosis.  8. Moderate to severe tricuspid stenosis.  9. The tricuspid valve is normal in structure. 10. The aortic valve is normal in structure. Aortic valve regurgitation is not visualized. No evidence of aortic valve sclerosis or stenosis. 11. The pulmonic valve was normal in structure. Pulmonic valve regurgitation is not visualized. 12. Moderately elevated pulmonary artery systolic pressure. 13. The inferior vena cava is dilated in size with <50% respiratory variability, suggesting right atrial pressure of 15 mmHg. 14. No significant change from 09/17/2016 study. 15. Prior images reviewed side by side. FINDINGS  Left Ventricle: Left ventricular ejection fraction, by visual estimation, is 60 to 65%. The left ventricle has normal function. The left ventricle has no regional wall motion abnormalities. The left ventricular internal cavity size was the left ventricle is normal in size. There is no left ventricular hypertrophy. The left ventricular diastology could not be evaluated due to atrial fibrillation. Left ventricular diastolic function could not be evaluated. Normal left atrial pressure. Right Ventricle: The right ventricular size is normal. No increase in right ventricular wall thickness. Global RV systolic function is has normal systolic function. The tricuspid regurgitant velocity is 2.90 m/s, and with an  assumed right atrial pressure  of 15 mmHg, the estimated right ventricular systolic pressure is moderately elevated at 48.6 mmHg. Left Atrium: Left atrial size was severely dilated. Right Atrium: Right atrial size was severely dilated Prominent Eustachian valve. Pericardium: There is no evidence of pericardial effusion. Mitral Valve: The mitral valve is normal in structure. There is mild thickening of the mitral valve leaflet(s). Trivial mitral valve regurgitation. No evidence of mitral valve stenosis by observation. Tricuspid Valve: The tricuspid valve is normal in structure. Tricuspid valve regurgitation moderate. Moderate to severe tricuspid stenosis is present. Aortic Valve: The aortic valve is normal in structure. Aortic valve regurgitation is not visualized. The aortic valve is structurally normal,  with no evidence of sclerosis or stenosis. Pulmonic Valve: The pulmonic valve was normal in structure. Pulmonic valve regurgitation is not visualized. Pulmonic regurgitation is not visualized. Aorta: The aortic root, ascending aorta and aortic arch are all structurally normal, with no evidence of dilitation or obstruction. Venous: The inferior vena cava is dilated in size with less than 50% respiratory variability, suggesting right atrial pressure of 15 mmHg. IAS/Shunts: No atrial level shunt detected by color flow Doppler. There is no evidence of a patent foramen ovale. No ventricular septal defect is seen or detected. There is no evidence of an atrial septal defect.  LEFT VENTRICLE PLAX 2D LVIDd:         3.80 cm LVIDs:         2.20 cm LV PW:         1.00 cm LV IVS:        0.80 cm LVOT diam:     1.80 cm LV SV:         46 ml LV SV Index:   29.57 LVOT Area:     2.54 cm  LEFT ATRIUM             Index       RIGHT ATRIUM           Index LA diam:        4.70 cm 3.04 cm/m  RA Area:     22.00 cm LA Vol (A2C):   34.8 ml 22.47 ml/m RA Volume:   68.70 ml  44.36 ml/m LA Vol (A4C):   60.7 ml 39.20 ml/m LA Biplane Vol:  45.7 ml 29.51 ml/m  AORTIC VALVE LVOT Vmax:   52.20 cm/s LVOT Vmean:  38.000 cm/s LVOT VTI:    0.113 m  AORTA Ao Root diam: 2.80 cm MITRAL VALVE                       TRICUSPID VALVE MV Area (PHT): 2.95 cm            TR Peak grad:   33.6 mmHg MV PHT:        74.53 msec          TR Vmax:        307.00 cm/s MV Decel Time: 257 msec MV E velocity: 81.40 cm/s 103 cm/s SHUNTS                                    Systemic VTI:  0.11 m                                    Systemic Diam: 1.80 cm  Mihai Croitoru MD Electronically signed by Sanda Klein MD Signature Date/Time: 06/07/2019/6:02:42 PM    Final      Labs:   Basic Metabolic Panel: Recent Labs  Lab 06/03/19 2044 06/05/19 1346 06/06/19 2009  NA 135 142 142  K 3.3* 3.5 3.9  CL 98 104 106  CO2 28 30 27   GLUCOSE 103* 103* 103*  BUN 11 18 19   CREATININE 0.88 0.87 1.18*  CALCIUM 9.1 9.0 9.4   GFR CrCl cannot be calculated (Unknown ideal weight.). Liver Function Tests: Recent Labs  Lab 06/03/19 2044 06/06/19 2009  AST 28 28  ALT 10 10  ALKPHOS 65 59  BILITOT 0.5 0.7  PROT 7.6  6.5  ALBUMIN 4.3 3.9   No results for input(s): LIPASE, AMYLASE in the last 168 hours. No results for input(s): AMMONIA in the last 168 hours. Coagulation profile Recent Labs  Lab 06/03/19 2044  INR 1.0    CBC: Recent Labs  Lab 06/03/19 2044 06/05/19 1346 06/06/19 2009  WBC 10.1 8.7 8.8  NEUTROABS 6.1 4.9 4.3  HGB 13.8 12.6 13.0  HCT 41.1 38.6 39.6  MCV 96.5 99.5 98.8  PLT 182 176 192   Cardiac Enzymes: No results for input(s): CKTOTAL, CKMB, CKMBINDEX, TROPONINI in the last 168 hours. BNP: Invalid input(s): POCBNP CBG: Recent Labs  Lab 06/03/19 1932 06/06/19 2004  GLUCAP 90 98   D-Dimer No results for input(s): DDIMER in the last 72 hours. Hgb A1c Recent Labs    06/08/19 0908  HGBA1C 5.0   Lipid Profile Recent Labs    06/08/19 0621  CHOL 181  HDL 55  LDLCALC 114*  TRIG 59  CHOLHDL 3.3   Thyroid function studies No  results for input(s): TSH, T4TOTAL, T3FREE, THYROIDAB in the last 72 hours.  Invalid input(s): FREET3 Anemia work up Recent Labs    06/08/19 0908  [id number] 300   Microbiology Recent Results (from the past 240 hour(s))  Respiratory Panel by RT PCR (Flu A&B, Covid) - Nasopharyngeal Swab     Status: None   Collection Time: 06/05/19  1:46 PM   Specimen: Nasopharyngeal Swab  Result Value Ref Range Status   SARS Coronavirus 2 by RT PCR NEGATIVE NEGATIVE Final    Comment: (NOTE) SARS-CoV-2 target nucleic acids are NOT DETECTED. The SARS-CoV-2 RNA is generally detectable in upper respiratoy specimens during the acute phase of infection. The lowest concentration of SARS-CoV-2 viral copies this assay can detect is 131 copies/mL. A negative result does not preclude SARS-Cov-2 infection and should not be used as the sole basis for treatment or other patient management decisions. A negative result may occur with  improper specimen collection/handling, submission of specimen other than nasopharyngeal swab, presence of viral mutation(s) within the areas targeted by this assay, and inadequate number of viral copies (<131 copies/mL). A negative result must be combined with clinical observations, patient history, and epidemiological information. The expected result is Negative. Fact Sheet for Patients:  PinkCheek.be Fact Sheet for Healthcare Providers:  GravelBags.it This test is not yet ap proved or cleared by the [place] FDA and  has been authorized for detection and/or diagnosis of SARS-CoV-2 by FDA under an Emergency Use Authorization (EUA). This EUA will remain  in effect (meaning this test can be used) for the duration of the COVID-19 declaration under Section 564(b)(1) of the Act, 21 U.S.C. section 360bbb-3(b)(1), unless the authorization is terminated or revoked sooner.    Influenza A by PCR NEGATIVE NEGATIVE Final    Influenza B by PCR NEGATIVE NEGATIVE Final    Comment: (NOTE) The Xpert Xpress SARS-CoV-2/FLU/RSV assay is intended as an aid in  the diagnosis of influenza from Nasopharyngeal swab specimens and  should not be used as a sole basis for treatment. Nasal washings and  aspirates are unacceptable for Xpert Xpress SARS-CoV-2/FLU/RSV  testing. Fact Sheet for Patients: PinkCheek.be Fact Sheet for Healthcare Providers: GravelBags.it This test is not yet approved or cleared by the [place] FDA and  has been authorized for detection and/or diagnosis of SARS-CoV-2 by  FDA under an Emergency Use Authorization (EUA). This EUA will remain  in effect (meaning this test can be used) for the duration of the  Covid-19 declaration under Section 564(b)(1) of the Act, 21  U.S.C. section 360bbb-3(b)(1), unless the authorization is  terminated or revoked. Performed at [facility] [place], [place] [address] [place], [place] [address]   SARS CORONAVIRUS 2 (TAT 6-24 HRS) Nasopharyngeal Nasopharyngeal Swab     Status: None   Collection Time: 06/07/19 12:02 PM   Specimen: Nasopharyngeal Swab  Result Value Ref Range Status   SARS Coronavirus 2 NEGATIVE NEGATIVE Final    Comment: (NOTE) SARS-CoV-2 target nucleic acids are NOT DETECTED. The SARS-CoV-2 RNA is generally detectable in upper and lower respiratory specimens during the acute phase of infection. Negative results do not preclude SARS-CoV-2 infection, do not rule out co-infections with other pathogens, and should not be used as the sole basis for treatment or other patient management decisions. Negative results must be combined with clinical observations, patient history, and epidemiological information. The expected result is Negative. Fact Sheet for Patients: SugarRoll.be Fact Sheet for Healthcare  Providers: https://www.woods-mathews.com/ This test is not yet approved or cleared by the [place] FDA and  has been authorized for detection and/or diagnosis of SARS-CoV-2 by FDA under an Emergency Use Authorization (EUA). This EUA will remain  in effect (meaning this test can be used) for the duration of the COVID-19 declaration under Section 56 4(b)(1) of the Act, 21 U.S.C. section 360bbb-3(b)(1), unless the authorization is terminated or revoked sooner. Performed at [facility], [place] [address] [place], [place] [address]      Discharge Instructions:   Discharge Instructions    Ambulatory referral to Neurology   Complete by: As directed    Follow up with stroke clinic NP (Epifanio Labrador Vanschaick or Cecille Rubin, if both not available, consider Zachery Dauer, or [place]) at [facility] in about 4 weeks. Thanks.   Diet - low sodium heart healthy   Complete by: As directed    Discharge instructions   Complete by: As directed    No driving   Increase activity slowly   Complete by: As directed      Allergies as of 06/08/2019      Reactions   Codeine Nausea And Vomiting      Medication List    TAKE these medications   atorvastatin 40 MG tablet Commonly known as: LIPITOR Take 1 tablet (40 mg total) by mouth daily at 6 PM.   metoprolol succinate 25 MG 24 hr tablet Commonly known as: TOPROL-XL Take 1 tablet (25 mg total) by mouth daily.   Rivaroxaban 15 MG Tabs tablet Commonly known as: XARELTO Take 1 tablet (15 mg total) by mouth daily before supper.      Follow-up Information    [facility]. Schedule an appointment as soon as possible for a visit in 4 week(s).   Specialty: Neurology Contact information: [address] [place] [phone]       [name], MD Follow up in 1 week(s).   Specialty: Internal Medicine Contact information: [place]  [address] [phone]        Sanda Klein, MD .   Specialty: Cardiology Contact information: [address] [place]  [address] [phone]            Time coordinating discharge: 35 min  Signed:  Geradine Girt DO  [facility] 06/08/2019, 2:06 PM

## 2019-06-10 DIAGNOSIS — I2510 Atherosclerotic heart disease of native coronary artery without angina pectoris: Secondary | ICD-10-CM | POA: Diagnosis not present

## 2019-06-10 DIAGNOSIS — F0390 Unspecified dementia without behavioral disturbance: Secondary | ICD-10-CM | POA: Diagnosis not present

## 2019-06-10 DIAGNOSIS — J449 Chronic obstructive pulmonary disease, unspecified: Secondary | ICD-10-CM | POA: Diagnosis not present

## 2019-06-10 DIAGNOSIS — I4821 Permanent atrial fibrillation: Secondary | ICD-10-CM | POA: Diagnosis not present

## 2019-06-10 DIAGNOSIS — I272 Pulmonary hypertension, unspecified: Secondary | ICD-10-CM | POA: Diagnosis not present

## 2019-06-10 DIAGNOSIS — I110 Hypertensive heart disease with heart failure: Secondary | ICD-10-CM | POA: Diagnosis not present

## 2019-06-10 DIAGNOSIS — I5032 Chronic diastolic (congestive) heart failure: Secondary | ICD-10-CM | POA: Diagnosis not present

## 2019-06-10 DIAGNOSIS — I071 Rheumatic tricuspid insufficiency: Secondary | ICD-10-CM | POA: Diagnosis not present

## 2019-06-10 DIAGNOSIS — M150 Primary generalized (osteo)arthritis: Secondary | ICD-10-CM | POA: Diagnosis not present

## 2019-06-12 DIAGNOSIS — F0280 Dementia in other diseases classified elsewhere without behavioral disturbance: Secondary | ICD-10-CM | POA: Diagnosis not present

## 2019-06-12 DIAGNOSIS — I110 Hypertensive heart disease with heart failure: Secondary | ICD-10-CM | POA: Diagnosis not present

## 2019-06-12 DIAGNOSIS — I4821 Permanent atrial fibrillation: Secondary | ICD-10-CM | POA: Diagnosis not present

## 2019-06-12 DIAGNOSIS — G309 Alzheimer's disease, unspecified: Secondary | ICD-10-CM | POA: Diagnosis not present

## 2019-06-12 DIAGNOSIS — I69398 Other sequelae of cerebral infarction: Secondary | ICD-10-CM | POA: Diagnosis not present

## 2019-06-12 DIAGNOSIS — M6281 Muscle weakness (generalized): Secondary | ICD-10-CM | POA: Diagnosis not present

## 2019-06-12 DIAGNOSIS — I5032 Chronic diastolic (congestive) heart failure: Secondary | ICD-10-CM | POA: Diagnosis not present

## 2019-06-12 DIAGNOSIS — J449 Chronic obstructive pulmonary disease, unspecified: Secondary | ICD-10-CM | POA: Diagnosis not present

## 2019-06-12 DIAGNOSIS — I2510 Atherosclerotic heart disease of native coronary artery without angina pectoris: Secondary | ICD-10-CM | POA: Diagnosis not present

## 2019-06-13 ENCOUNTER — Emergency Department (HOSPITAL_COMMUNITY)
Admission: EM | Admit: 2019-06-13 | Discharge: 2019-06-13 | Disposition: A | Discharge status: Home / Self Care | Payer: Medicare HMO | Attending: Emergency Medicine | Admitting: Emergency Medicine

## 2019-06-13 ENCOUNTER — Emergency Department (HOSPITAL_COMMUNITY): Payer: Medicare HMO

## 2019-06-13 DIAGNOSIS — R079 Chest pain, unspecified: Secondary | ICD-10-CM | POA: Diagnosis not present

## 2019-06-13 DIAGNOSIS — I4821 Permanent atrial fibrillation: Secondary | ICD-10-CM | POA: Diagnosis not present

## 2019-06-13 DIAGNOSIS — M79602 Pain in left arm: Secondary | ICD-10-CM | POA: Insufficient documentation

## 2019-06-13 DIAGNOSIS — I071 Rheumatic tricuspid insufficiency: Secondary | ICD-10-CM | POA: Diagnosis not present

## 2019-06-13 DIAGNOSIS — I110 Hypertensive heart disease with heart failure: Secondary | ICD-10-CM | POA: Insufficient documentation

## 2019-06-13 DIAGNOSIS — I4891 Unspecified atrial fibrillation: Secondary | ICD-10-CM | POA: Diagnosis not present

## 2019-06-13 DIAGNOSIS — Z79899 Other long term (current) drug therapy: Secondary | ICD-10-CM | POA: Diagnosis not present

## 2019-06-13 DIAGNOSIS — Z7901 Long term (current) use of anticoagulants: Secondary | ICD-10-CM | POA: Diagnosis not present

## 2019-06-13 DIAGNOSIS — M150 Primary generalized (osteo)arthritis: Secondary | ICD-10-CM | POA: Diagnosis not present

## 2019-06-13 DIAGNOSIS — Z87891 Personal history of nicotine dependence: Secondary | ICD-10-CM | POA: Insufficient documentation

## 2019-06-13 DIAGNOSIS — I2510 Atherosclerotic heart disease of native coronary artery without angina pectoris: Secondary | ICD-10-CM | POA: Insufficient documentation

## 2019-06-13 DIAGNOSIS — F0390 Unspecified dementia without behavioral disturbance: Secondary | ICD-10-CM | POA: Diagnosis not present

## 2019-06-13 DIAGNOSIS — R0789 Other chest pain: Secondary | ICD-10-CM | POA: Diagnosis not present

## 2019-06-13 DIAGNOSIS — I5032 Chronic diastolic (congestive) heart failure: Secondary | ICD-10-CM | POA: Insufficient documentation

## 2019-06-13 DIAGNOSIS — I10 Essential (primary) hypertension: Secondary | ICD-10-CM | POA: Diagnosis not present

## 2019-06-13 DIAGNOSIS — Z85828 Personal history of other malignant neoplasm of skin: Secondary | ICD-10-CM | POA: Diagnosis not present

## 2019-06-13 DIAGNOSIS — G459 Transient cerebral ischemic attack, unspecified: Secondary | ICD-10-CM | POA: Diagnosis not present

## 2019-06-13 DIAGNOSIS — J449 Chronic obstructive pulmonary disease, unspecified: Secondary | ICD-10-CM | POA: Diagnosis not present

## 2019-06-13 DIAGNOSIS — I272 Pulmonary hypertension, unspecified: Secondary | ICD-10-CM | POA: Diagnosis not present

## 2019-06-13 LAB — BASIC METABOLIC PANEL
Anion gap: 8 (ref 5–15)
BUN: 7 mg/dL — ABNORMAL LOW (ref 8–23)
CO2: 29 mmol/L (ref 22–32)
Calcium: 9 mg/dL (ref 8.9–10.3)
Chloride: 101 mmol/L (ref 98–111)
Creatinine, Ser: 0.85 mg/dL (ref 0.44–1.00)
GFR calc Af Amer: >60 mL/min (ref >60)
GFR calc non Af Amer: >60 mL/min (ref >60)
Glucose, Bld: 97 mg/dL (ref 70–99)
Potassium: 3.7 mmol/L (ref 3.5–5.1)
Sodium: 138 mmol/L (ref 135–145)

## 2019-06-13 LAB — CBC WITH DIFFERENTIAL/PLATELET
Abs Immature Granulocytes: 0.02 10*3/uL (ref 0.00–0.07)
Basophils Absolute: 0.1 10*3/uL (ref 0.0–0.1)
Basophils Relative: 1 %
Eosinophils Absolute: 0.2 10*3/uL (ref 0.0–0.5)
Eosinophils Relative: 2 %
HCT: 38.6 % (ref 36.0–46.0)
Hemoglobin: 12.9 g/dL (ref 12.0–15.0)
Immature Granulocytes: 0 %
Lymphocytes Relative: 37 %
Lymphs Abs: 3.4 10*3/uL (ref 0.7–4.0)
MCH: 32.7 pg (ref 26.0–34.0)
MCHC: 33.4 g/dL (ref 30.0–36.0)
MCV: 98 fL (ref 80.0–100.0)
Monocytes Absolute: 0.9 10*3/uL (ref 0.1–1.0)
Monocytes Relative: 9 %
Neutro Abs: 4.8 10*3/uL (ref 1.7–7.7)
Neutrophils Relative %: 51 %
Platelets: 203 10*3/uL (ref 150–400)
RBC: 3.94 MIL/uL (ref 3.87–5.11)
RDW: 13.6 % (ref 11.5–15.5)
WBC: 9.3 10*3/uL (ref 4.0–10.5)
nRBC: 0 % (ref 0.0–0.2)

## 2019-06-13 LAB — TROPONIN I (HIGH SENSITIVITY)
Troponin I (High Sensitivity): 12 ng/L (ref <18)
Troponin I (High Sensitivity): 15 ng/L (ref <18)

## 2019-06-13 MED ORDER — NICOTINE 21 MG/24HR TD PT24
21.0000 mg | MEDICATED_PATCH | Freq: Once | TRANSDERMAL | Status: DC
  Administered 2019-06-13: 23:00:00 21 mg via TRANSDERMAL
  Filled 2019-06-13: qty 1

## 2019-06-13 NOTE — ED Notes (Signed)
Spoke with son, don Midgley who will be coming to pick her up. He stated he will call the ED line to let staff know when he arrives. Also discussed d/c instructions and follow up care with him over the phone.

## 2019-06-13 NOTE — ED Triage Notes (Signed)
Pt BIB [facility] after an episode of chest pain that occurred this evening. Pt reports that the chest pain was on the left side of her chest and radiated down into her left arm. Pt received 324mg  of aspirin prior to EMS arrival. Pt denying any pain upon ED arrival. No medications given by EMS. VSS upon ED arrival. 18GA left AC placed by EMS.

## 2019-06-13 NOTE — ED Provider Notes (Signed)
[facility] EMERGENCY DEPARTMENT Provider Note   CSN: [id number] Arrival date & time: 06/13/19  1857     History Chief Complaint  Patient presents with  . Chest Pain    Tiffany Charles is a 86 y.o. female.  HPI  86 year old female with left arm pain.  Onset before arrival.  Patient states that she was sitting in her son room at home when the pain began.  Pain from her shoulder down into her hand.  Says it just hurt.  No neck pain. No numbness, tingling. She denies any chest pain to me.  Denies any associated dyspnea, nausea or diaphoresis.  She is not sure exactly how long the pain lasted but is currently gone.  She felt fine earlier in the day.  Past Medical History:  Diagnosis Date  . Angina pectoris   . Arthritis   . Chronic anticoagulation    Xarelto  . Chronic diastolic (congestive) heart failure ([place])   . COPD (chronic obstructive pulmonary disease) ([place])   . Coronary artery disease    a. s/p RCA stent 2008. b. cath 02/2009: 30% mLAD, 90% AV groove cx unchanged from prior, 50-60% RCA, EF 60% -> med rx.  . Depression   . Essential hypertension   . Hyperlipidemia   . Mild pulmonary hypertension ([place]) 09/17/2016  . Permanent atrial fibrillation ([place])   . Skin cancer Jan 2013   squamous cell- removed  . Tricuspid regurgitation 09/17/2016    Patient Active Problem List   Diagnosis Date Noted  . DNR (do not resuscitate) 06/08/2019  . Occipital stroke ([place]) 06/07/2019  . Squamous cell carcinoma, leg, unspecified laterality 06/08/2018  . Basal cell carcinoma (BCC) of nasal tip 06/08/2018  . Senile dementia without behavioral disturbance ([place]) 04/29/2017  . Frequent PVCs 09/17/2016  . Tricuspid regurgitation 09/17/2016  . Mild pulmonary hypertension ([place]) 09/17/2016  . Chronic diastolic (congestive) heart failure ([place])   . Essential hypertension   . Generalized OA 12/28/2014  . Routine general medical examination at a health care facility 11/09/2013    . Depression, major ([place]) 11/07/2013  . Chronic anticoagulation- Xarelto 12/28/2012  . Permanent atrial fibrillation 10/28/2012  . COPD - PFTs in June 2014 with MET test 10/28/2012  . CAD, RCA BMS '09, 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 low risk Myoview Feb 2013 10/28/2012  . Hyperlipidemia 10/28/2012    Past Surgical History:  Procedure Laterality Date  .  CATARACTS REMOVED    . ABDOMINAL HYSTERECTOMY    . CARDIAC CATHETERIZATION  03/22/2009   Started medical therapy  . CARDIOVERSION  06/05/2009   3 attempts, last attempt successful  . CORONARY ANGIOPLASTY WITH STENT PLACEMENT  2009   RCA Vision stent  . HIP SURGERY    . MELANOMA EXCISION  06/06/2011   Procedure: MELANOMA EXCISION;  Surgeon: Adin Hector, MD;  Location: [facility] ORS;  Service: General;  Laterality: Left;  re excision squamous cell lesion left chest wall      OB History   No obstetric history on file.     Family History  Problem Relation Age of Onset  . Stroke Mother   . Heart disease Mother   . Coronary artery disease Father   . Heart disease Father   . Stroke Sister   . Heart disease Brother   . Stroke Sister   . Stroke Sister     Social History   Tobacco Use  . Smoking status: Former Smoker    Packs/day: 0.50  .  Smokeless tobacco: Never Used  Substance Use Topics  . Alcohol use: No  . Drug use: No    Home Medications Prior to Admission medications   Medication Sig Start Date End Date Taking? Authorizing Provider  atorvastatin (LIPITOR) 40 MG tablet Take 1 tablet (40 mg total) by mouth daily at 6 PM. 06/08/19   Eulogio Bear U, DO  metoprolol succinate (TOPROL-XL) 25 MG 24 hr tablet Take 1 tablet (25 mg total) by mouth daily. 05/05/19   Croitoru, Mihai, MD  Rivaroxaban (XARELTO) 15 MG TABS tablet Take 1 tablet (15 mg total) by mouth daily before supper. 05/05/19   Croitoru, Mihai, MD    Allergies    Codeine  Review of Systems   Review of Systems All systems reviewed and negative,  other than as noted in HPI.  Physical Exam Updated Vital Signs BP (!) 164/84   Pulse 87   Temp 97.7 F (36.5 C) (Oral)   Resp 14   SpO2 97%   Physical Exam Vitals and nursing note reviewed.  Constitutional:      General: She is not in acute distress.    Appearance: She is well-developed.  HENT:     Head: Normocephalic and atraumatic.  Eyes:     General:        Right eye: No discharge.        Left eye: No discharge.     Conjunctiva/sclera: Conjunctivae normal.  Cardiovascular:     Rate and Rhythm: Normal rate and regular rhythm.     Heart sounds: Normal heart sounds. No murmur. No friction rub. No gallop.   Pulmonary:     Effort: Pulmonary effort is normal. No respiratory distress.     Breath sounds: Normal breath sounds.  Abdominal:     General: There is no distension.     Palpations: Abdomen is soft.     Tenderness: There is no abdominal tenderness.  Musculoskeletal:        General: No tenderness.     Cervical back: Neck supple.     Comments: Left arm normal in appearance.  No swelling concerning skin lesions noted.  No tenderness.  Able to actively range at the shoulder, elbow, wrist/hand without any apparent discomfort.  Palpable radial pulse.  Strength normal.  Sensation intact to light touch.  Skin:    General: Skin is warm and dry.  Neurological:     Mental Status: She is alert.  Psychiatric:        Behavior: Behavior normal.        Thought Content: Thought content normal.     ED Results / Procedures / Treatments   Labs (all labs ordered are listed, but only abnormal results are displayed) Labs Reviewed  BASIC METABOLIC PANEL - Abnormal; Notable for the following components:      Result Value   BUN 7 (*)    All other components within normal limits  CBC WITH DIFFERENTIAL/PLATELET  TROPONIN I (HIGH SENSITIVITY)  TROPONIN I (HIGH SENSITIVITY)    EKG EKG Interpretation  Date/Time:  Monday June 13 2019 20:28:19 EST Ventricular Rate:  75 PR  Interval:    QRS Duration: 89 QT Interval:  410 QTC Calculation: 458 R Axis:   64 Text Interpretation: Atrial fibrillation Anteroseptal infarct, old Repol abnrm suggests ischemia, anterolateral No significant change since last tracing Confirmed by Virgel Manifold [phone]) on 06/13/2019 8:58:41 PM   Radiology DG Chest 1 View  Result Date: 06/13/2019 CLINICAL DATA:  Left chest pain and radiating down  the left arm. EXAM: CHEST  1 VIEW COMPARISON:  06/05/2019 FINDINGS: The patient is rotated to the right on today's radiograph, reducing diagnostic sensitivity and specificity. Suspected mild enlargement of the cardiopericardial silhouette. No edema. Linear subsegmental atelectasis or scarring at the right lung base. No blunting of the costophrenic angles. Atherosclerotic calcification of the aortic arch. IMPRESSION: 1. Mild enlargement of the cardiopericardial silhouette, without edema. 2. Subsegmental atelectasis or scarring at the right lung base. 3.  Aortic Atherosclerosis (ICD10-I70.0). Electronically Signed   By: Van Clines M.D.   On: 06/13/2019 20:02    Procedures Procedures (including critical care time)  Medications Ordered in ED Medications - No data to display  ED Course  I have reviewed the triage vital signs and the nursing notes.  Pertinent labs & imaging results that were available during my care of the patient were reviewed by me and considered in my medical decision making (see chart for details).    MDM Rules/Calculators/A&P                      89yF with L arm pain, now resolved. I doubt ACS, PE, dissection or other emergent process. Radicular? NVI. ED w/u fairly unrevealing.   Final Clinical Impression(s) / ED Diagnoses Final diagnoses:  Left arm pain    Rx / DC Orders ED Discharge Orders    None       Virgel Manifold, MD 06/13/19 2349

## 2019-06-13 NOTE — ED Notes (Signed)
Patient transported to X-ray 

## 2019-06-14 ENCOUNTER — Telehealth: Payer: Self-pay

## 2019-06-14 DIAGNOSIS — I5032 Chronic diastolic (congestive) heart failure: Secondary | ICD-10-CM | POA: Diagnosis not present

## 2019-06-14 DIAGNOSIS — F0280 Dementia in other diseases classified elsewhere without behavioral disturbance: Secondary | ICD-10-CM | POA: Diagnosis not present

## 2019-06-14 DIAGNOSIS — I2510 Atherosclerotic heart disease of native coronary artery without angina pectoris: Secondary | ICD-10-CM | POA: Diagnosis not present

## 2019-06-14 DIAGNOSIS — M6281 Muscle weakness (generalized): Secondary | ICD-10-CM | POA: Diagnosis not present

## 2019-06-14 DIAGNOSIS — G309 Alzheimer's disease, unspecified: Secondary | ICD-10-CM | POA: Diagnosis not present

## 2019-06-14 DIAGNOSIS — J449 Chronic obstructive pulmonary disease, unspecified: Secondary | ICD-10-CM | POA: Diagnosis not present

## 2019-06-14 DIAGNOSIS — I4821 Permanent atrial fibrillation: Secondary | ICD-10-CM | POA: Diagnosis not present

## 2019-06-14 DIAGNOSIS — I110 Hypertensive heart disease with heart failure: Secondary | ICD-10-CM | POA: Diagnosis not present

## 2019-06-14 DIAGNOSIS — I69398 Other sequelae of cerebral infarction: Secondary | ICD-10-CM | POA: Diagnosis not present

## 2019-06-14 NOTE — Telephone Encounter (Signed)
Tiffany Charles cell [phone]. Call Tiffany Charles at [facility] give verbal orders and fax back once received. Office [phone].

## 2019-06-14 NOTE — Telephone Encounter (Signed)
RN to come out to do education for disease management, process, skin care and diet, medications and side effects to report with care giver.   1wk for 8 wks  Darlene stated that son was beligerant with PT and SW when they tried to set up for their visits.   Darlene stated that she felt pt lives alone.   Gave verbal order for nursing orders as requested.

## 2019-06-16 ENCOUNTER — Other Ambulatory Visit: Payer: Self-pay

## 2019-06-16 NOTE — Patient Outreach (Signed)
[place] [facility]) Care Management  06/16/2019  Tiffany Charles 02-09-1930 [id number]    EMMI-Stroke RED ON EMMI ALERT Day # 6 Date: 06/15/2019 Red Alert Reason: "Feeling worse overall? Ys  New or worsening pain/fver/shortness of breath? Yes Able to eat and drink? No Questions/problems with meds? Yes"   Outreach attempt # 1 to patient. No answer. RN CM left HIPAA compliant voicemail message along with contact info.      Plan: RN CM will make outreach attempt to patient within 3-4 business days. RN CM will send unsuccessful outreach letter to patient.  [name], RN,BSN,CCM [place] [facility] Telephonic Care Management Coordinator Direct Phone: [phone] Toll Free: [phone] Fax: [phone]

## 2019-06-17 ENCOUNTER — Other Ambulatory Visit: Payer: Self-pay

## 2019-06-17 DIAGNOSIS — I110 Hypertensive heart disease with heart failure: Secondary | ICD-10-CM | POA: Diagnosis not present

## 2019-06-17 DIAGNOSIS — M150 Primary generalized (osteo)arthritis: Secondary | ICD-10-CM | POA: Diagnosis not present

## 2019-06-17 DIAGNOSIS — I69398 Other sequelae of cerebral infarction: Secondary | ICD-10-CM | POA: Diagnosis not present

## 2019-06-17 DIAGNOSIS — G309 Alzheimer's disease, unspecified: Secondary | ICD-10-CM | POA: Diagnosis not present

## 2019-06-17 DIAGNOSIS — I2510 Atherosclerotic heart disease of native coronary artery without angina pectoris: Secondary | ICD-10-CM | POA: Diagnosis not present

## 2019-06-17 DIAGNOSIS — J449 Chronic obstructive pulmonary disease, unspecified: Secondary | ICD-10-CM | POA: Diagnosis not present

## 2019-06-17 DIAGNOSIS — I071 Rheumatic tricuspid insufficiency: Secondary | ICD-10-CM | POA: Diagnosis not present

## 2019-06-17 DIAGNOSIS — M6281 Muscle weakness (generalized): Secondary | ICD-10-CM | POA: Diagnosis not present

## 2019-06-17 DIAGNOSIS — F0280 Dementia in other diseases classified elsewhere without behavioral disturbance: Secondary | ICD-10-CM | POA: Diagnosis not present

## 2019-06-17 DIAGNOSIS — I4821 Permanent atrial fibrillation: Secondary | ICD-10-CM | POA: Diagnosis not present

## 2019-06-17 DIAGNOSIS — I5032 Chronic diastolic (congestive) heart failure: Secondary | ICD-10-CM | POA: Diagnosis not present

## 2019-06-17 DIAGNOSIS — I272 Pulmonary hypertension, unspecified: Secondary | ICD-10-CM | POA: Diagnosis not present

## 2019-06-17 DIAGNOSIS — F0390 Unspecified dementia without behavioral disturbance: Secondary | ICD-10-CM | POA: Diagnosis not present

## 2019-06-17 NOTE — Patient Outreach (Signed)
[place] [facility]) Care Management  06/17/2019  Tiffany Charles 1930/03/19 [id number]   EMMI-Stroke RED ON EMMI ALERT Day # 6 Date: 06/15/2019 Red Alert Reason: "Feeling worse overall? Ys  New or worsening pain/fver/shortness of breath? Yes Able to eat and drink? No Questions/problems with meds? Yes"   Outreach attempt #2 to patient.No answer at present.    Plan: RN CM will make outreach attempt to patient within 3-4 business days.   [name], RN,BSN,CCM [place] Management Telephonic Care Management Coordinator Direct Phone: [phone] Toll Free: [phone] Fax: [phone]

## 2019-06-20 ENCOUNTER — Other Ambulatory Visit: Payer: Self-pay

## 2019-06-20 ENCOUNTER — Encounter: Payer: Medicare HMO | Admitting: Physician Assistant

## 2019-06-20 NOTE — Patient Outreach (Addendum)
[place] [facility]) Care Management  06/20/2019  Tiffany Charles 01/22/1930 [id number]   EMMI-Stroke RED ON EMMI ALERT Day #6 Date:06/15/2019 Red Alert Reason:"Feeling worse overall? Ys New or worsening pain/fver/shortness of breath? Yes Able to eat and drink? No Questions/problems with meds? Yes" Day # 9 Date: 06/18/19 Red Alert Reason: "Sad,hopeless,anxious,empty? Yes"   Outreach attempt #3 to patient. No answer after multiple rings.     Plan: RN CM will close case if no response from letter mailed to patient.    [name], RN,BSN,CCM [place] Management Telephonic Care Management Coordinator Direct Phone: [phone] Toll Free: [phone] Fax: [phone]

## 2019-06-20 NOTE — Progress Notes (Deleted)
Cardiology Office Note  Date: 06/20/2019   ID: Tiffany Charles, DOB 1930/04/28, MRN [id number]  PCP:  [name], MD  Cardiologist:  Sanda Klein, MD Electrophysiologist:  None   No chief complaint on file.   History of Present Illness: Tiffany Charles is a 86 y.o. female last seen January 10, 2019 by Almyra Deforest PA.  History of chronic diastolic heart failure, hypertension, hyper lipidemia, permanent atrial fibrillation on home Xarelto.  History of COPD CAD status post PCI to RCA.  Previous echocardiogram showed EF of 55 to 60%, mild LVH, mild MR, severe left atrial enlargement, PA peak pressure of 40 mmHg.  At that time she was not taking some of her medications.  She was not very compliant.  Has history of forgetfulness and dementia.   Recently had a presentation to the emergency room January 18 with complaint of left arm pain.  Pain was not associated with exertion states the pain radiated from her shoulder down to her hand.  She denied any chest pain, neck pain, jaw pain or back pain, denied any dyspnea, nausea or diaphoresis.  Apparently the pain had resolved after arrival to the emergency room.  She had troponin I x2 to which were negative.  BMP was normal.  CBC was unremarkable  Medication reviewed today.  Patient is taking aspirin 81 mg, atorvastatin 40 mg daily.  Metoprolol XL 25 mg daily.  Xarelto 15 mg daily.   Past Medical History:  Diagnosis Date  . Angina pectoris   . Arthritis   . Chronic anticoagulation    Xarelto  . Chronic diastolic (congestive) heart failure ([place])   . COPD (chronic obstructive pulmonary disease) ([place])   . Coronary artery disease    a. s/p RCA stent 2008. b. cath 02/2009: 30% mLAD, 90% AV groove cx unchanged from prior, 50-60% RCA, EF 60% -> med rx.  . Depression   . Essential hypertension   . Hyperlipidemia   . Mild pulmonary hypertension ([place]) 09/17/2016  . Permanent atrial fibrillation ([place])   . Skin cancer Jan 2013   squamous cell-  removed  . Tricuspid regurgitation 09/17/2016    Past Surgical History:  Procedure Laterality Date  .  CATARACTS REMOVED    . ABDOMINAL HYSTERECTOMY    . CARDIAC CATHETERIZATION  03/22/2009   Started medical therapy  . CARDIOVERSION  06/05/2009   3 attempts, last attempt successful  . CORONARY ANGIOPLASTY WITH STENT PLACEMENT  2009   RCA Vision stent  . HIP SURGERY    . MELANOMA EXCISION  06/06/2011   Procedure: MELANOMA EXCISION;  Surgeon: Adin Hector, MD;  Location: [facility];  Service: General;  Laterality: Left;  re excision squamous cell lesion left chest wall     Current Outpatient Medications  Medication Sig Dispense Refill  . aspirin EC 81 MG tablet Take 81-324 mg by mouth See admin instructions. Take 162 mg by mouth in the morning and 324 mg as needed for chest pain    . atorvastatin (LIPITOR) 40 MG tablet Take 1 tablet (40 mg total) by mouth daily at 6 PM. (Patient taking differently: Take 40 mg by mouth daily. ) 30 tablet 1  . metoprolol succinate (TOPROL-XL) 25 MG 24 hr tablet Take 1 tablet (25 mg total) by mouth daily. 90 tablet 0  . Rivaroxaban (XARELTO) 15 MG TABS tablet Take 1 tablet (15 mg total) by mouth daily before supper. (Patient taking differently: Take 15 mg by mouth daily after breakfast. )  90 tablet 0   No current facility-administered medications for this visit.   Allergies:  Codeine   Social History: The patient  reports that she has quit smoking. She smoked 0.50 packs per day. She has never used smokeless tobacco. She reports that she does not drink alcohol or use drugs.   Family History: The patient's family history includes Coronary artery disease in her father; Heart disease in her brother, father, and mother; Stroke in her mother, sister, sister, and sister.   ROS:  Please see the history of present illness. Otherwise, complete review of systems is positive for none.  All other systems are reviewed and negative.   Physical Exam: VS:  There were no  vitals taken for this visit., BMI There is no height or weight on file to calculate BMI.  Wt Readings from Last 3 Encounters:  01/10/19 118 lb 9.6 oz (53.8 kg)  06/30/18 119 lb (54 kg)  06/08/18 122 lb 8 oz (55.6 kg)    General: Patient appears comfortable at rest. HEENT: Conjunctiva and lids normal, oropharynx clear with moist mucosa. Neck: Supple, no elevated JVP or carotid bruits, no thyromegaly. Lungs: Clear to auscultation, nonlabored breathing at rest. Cardiac: Regular rate and rhythm, no S3 or significant systolic murmur, no pericardial rub. Abdomen: Soft, nontender, no hepatomegaly, bowel sounds present, no guarding or rebound. Extremities: No pitting edema, distal pulses 2+. Skin: Warm and dry. Musculoskeletal: No kyphosis. Neuropsychiatric: Alert and oriented x3, affect grossly appropriate.  ECG:  An ECG dated June 16, 2019 was personally reviewed today and demonstrated:  Atrial fibrillation with a rate of 75, anteroseptal infarct, old, repolarization abnormality to suggest ischemia, anterior lateral.  Recent Labwork: 06/05/2019: B Natriuretic Peptide 194.0 06/06/2019: ALT 10; AST 28 06/13/2019: BUN 7; Creatinine, Ser 0.85; Hemoglobin 12.9; Platelets 203; Potassium 3.7; Sodium 138     Component Value Date/Time   CHOL 181 06/08/2019 0621   CHOL 170 07/30/2017 1102   TRIG 59 06/08/2019 0621   HDL 55 06/08/2019 0621   HDL 60 07/30/2017 1102   CHOLHDL 3.3 06/08/2019 0621   VLDL 12 06/08/2019 0621   LDLCALC 114 (H) 06/08/2019 0621   LDLCALC 99 07/30/2017 1102    Other Studies Reviewed Today:  Echocardiogram June 07, 2019 IMPRESSIONS  1. Left ventricular ejection fraction, by visual estimation, is 60 to 65%. The left ventricle has normal function. There is no left ventricular hypertrophy.  2. Left ventricular diastolic function could not be evaluated.  3. The left ventricle has no regional wall motion abnormalities.  4. Global right ventricle has normal systolic  function.The right ventricular size is normal. No increase in right ventricular wall thickness.  5. Left atrial size was severely dilated.  6. Right atrial size was severely dilated.  7. The mitral valve is normal in structure. Trivial mitral valve regurgitation. No evidence of mitral stenosis.  8. Moderate to severe tricuspid stenosis.  9. The tricuspid valve is normal in structure. 10. The aortic valve is normal in structure. Aortic valve regurgitation is not visualized. No evidence of aortic valve sclerosis or stenosis. 11. The pulmonic valve was normal in structure. Pulmonic valve regurgitation is not visualized. 12. Moderately elevated pulmonary artery systolic pressure. 13. The inferior vena cava is dilated in size with <50% respiratory variability, suggesting right atrial pressure of 15 mmHg. 14. No significant change from 09/17/2016 study. 15. Prior images reviewed side by side.  FINDINGS  Left Ventricle: Left ventricular ejection fraction, by visual estimation, is 60 to 65%. The  left ventricle has normal function. The left ventricle has no regional wall motion abnormalities. The left ventricular internal cavity size was the left  ventricle is normal in size. There is no left ventricular hypertrophy. The left ventricular diastology could not be evaluated due to atrial fibrillation. Left ventricular diastolic function could not be evaluated. Normal left atrial pressure.  Right Ventricle: The right ventricular size is normal. No increase in right ventricular wall thickness. Global RV systolic function is has normal systolic function. The tricuspid regurgitant velocity is 2.90 m/s, and with an assumed right atrial pressure  of 15 mmHg, the estimated right ventricular systolic pressure is moderately elevated at 48.6 mmHg.  Left Atrium: Left atrial size was severely dilated.  Right Atrium: Right atrial size was severely dilated Prominent Eustachian valve.  Pericardium: There is no  evidence of pericardial effusion.  Mitral Valve: The mitral valve is normal in structure. There is mild thickening of the mitral valve leaflet(s). Trivial mitral valve regurgitation. No evidence of mitral valve stenosis by observation.  Tricuspid Valve: The tricuspid valve is normal in structure. Tricuspid valve regurgitation moderate. Moderate to severe tricuspid stenosis is present.  Aortic Valve: The aortic valve is normal in structure. Aortic valve regurgitation is not visualized. The aortic valve is structurally normal, with no evidence of sclerosis or stenosis.  Pulmonic Valve: The pulmonic valve was normal in structure. Pulmonic valve regurgitation is not visualized. Pulmonic regurgitation is not visualized.  Aorta: The aortic root, ascending aorta and aortic arch are all structurally normal, with no evidence of dilitation or obstruction.  Venous: The inferior vena cava is dilated in size with less than 50% respiratory variability, suggesting right atrial pressure of 15 mmHg.  IAS/Shunts: No atrial level shunt detected by color flow Doppler. There is no evidence of a patent foramen ovale. No ventricular septal defect is seen or detected. There is no evidence of an atrial septal defect.   MRI of brain June 07, 2019 COMPARISON:  CT head 06/03/2019  FINDINGS: Brain: Several small acute infarcts in the right occipital lobe. These appear to be cortical infarct. Otherwise no acute infarct.  Generalized atrophy. Mild chronic ischemic changes in the white matter. Negative for hemorrhage mass or hydrocephalus.  Vascular: Normal arterial flow voids.  Skull and upper cervical spine: Negative  Sinuses/Orbits: Paranasal sinuses clear.  Bilateral cataract surgery  Other: None  IMPRESSION: Several small cortical infarct right occipital lobe.  Atrophy and chronic microvascular ischemia.  Assessment and Plan:  1. Permanent atrial fibrillation   2. Essential  hypertension   3. Chronic diastolic (congestive) heart failure ([place])   4. Coronary artery disease involving native coronary artery of native heart without angina pectoris      Medication Adjustments/Labs and Tests Ordered: Current medicines are reviewed at length with the patient today.  Concerns regarding medicines are outlined above.    There are no Patient Instructions on file for this visit.       Signed, Levell July, NP 06/20/2019 2:40 PM    [facility] [place], [place], [place] [address] Phone: [phone]; Fax: [phone]

## 2019-06-21 DIAGNOSIS — I5032 Chronic diastolic (congestive) heart failure: Secondary | ICD-10-CM | POA: Diagnosis not present

## 2019-06-21 DIAGNOSIS — F0280 Dementia in other diseases classified elsewhere without behavioral disturbance: Secondary | ICD-10-CM | POA: Diagnosis not present

## 2019-06-21 DIAGNOSIS — I2510 Atherosclerotic heart disease of native coronary artery without angina pectoris: Secondary | ICD-10-CM | POA: Diagnosis not present

## 2019-06-21 DIAGNOSIS — I4821 Permanent atrial fibrillation: Secondary | ICD-10-CM | POA: Diagnosis not present

## 2019-06-21 DIAGNOSIS — G309 Alzheimer's disease, unspecified: Secondary | ICD-10-CM | POA: Diagnosis not present

## 2019-06-21 DIAGNOSIS — I69398 Other sequelae of cerebral infarction: Secondary | ICD-10-CM | POA: Diagnosis not present

## 2019-06-21 DIAGNOSIS — I110 Hypertensive heart disease with heart failure: Secondary | ICD-10-CM | POA: Diagnosis not present

## 2019-06-21 DIAGNOSIS — M6281 Muscle weakness (generalized): Secondary | ICD-10-CM | POA: Diagnosis not present

## 2019-06-21 DIAGNOSIS — J449 Chronic obstructive pulmonary disease, unspecified: Secondary | ICD-10-CM | POA: Diagnosis not present

## 2019-06-22 NOTE — Progress Notes (Signed)
This encounter was created in error - please disregard.

## 2019-06-23 DIAGNOSIS — I4821 Permanent atrial fibrillation: Secondary | ICD-10-CM | POA: Diagnosis not present

## 2019-06-23 DIAGNOSIS — I110 Hypertensive heart disease with heart failure: Secondary | ICD-10-CM | POA: Diagnosis not present

## 2019-06-23 DIAGNOSIS — I272 Pulmonary hypertension, unspecified: Secondary | ICD-10-CM | POA: Diagnosis not present

## 2019-06-23 DIAGNOSIS — I2510 Atherosclerotic heart disease of native coronary artery without angina pectoris: Secondary | ICD-10-CM | POA: Diagnosis not present

## 2019-06-23 DIAGNOSIS — I5032 Chronic diastolic (congestive) heart failure: Secondary | ICD-10-CM | POA: Diagnosis not present

## 2019-06-23 DIAGNOSIS — F0390 Unspecified dementia without behavioral disturbance: Secondary | ICD-10-CM | POA: Diagnosis not present

## 2019-06-23 DIAGNOSIS — I071 Rheumatic tricuspid insufficiency: Secondary | ICD-10-CM | POA: Diagnosis not present

## 2019-06-23 DIAGNOSIS — J449 Chronic obstructive pulmonary disease, unspecified: Secondary | ICD-10-CM | POA: Diagnosis not present

## 2019-06-23 DIAGNOSIS — M150 Primary generalized (osteo)arthritis: Secondary | ICD-10-CM | POA: Diagnosis not present

## 2019-06-24 DIAGNOSIS — I4821 Permanent atrial fibrillation: Secondary | ICD-10-CM | POA: Diagnosis not present

## 2019-06-24 DIAGNOSIS — I110 Hypertensive heart disease with heart failure: Secondary | ICD-10-CM | POA: Diagnosis not present

## 2019-06-24 DIAGNOSIS — M6281 Muscle weakness (generalized): Secondary | ICD-10-CM | POA: Diagnosis not present

## 2019-06-24 DIAGNOSIS — I5032 Chronic diastolic (congestive) heart failure: Secondary | ICD-10-CM | POA: Diagnosis not present

## 2019-06-24 DIAGNOSIS — F0280 Dementia in other diseases classified elsewhere without behavioral disturbance: Secondary | ICD-10-CM | POA: Diagnosis not present

## 2019-06-24 DIAGNOSIS — G309 Alzheimer's disease, unspecified: Secondary | ICD-10-CM | POA: Diagnosis not present

## 2019-06-24 DIAGNOSIS — I69398 Other sequelae of cerebral infarction: Secondary | ICD-10-CM | POA: Diagnosis not present

## 2019-06-24 DIAGNOSIS — I2510 Atherosclerotic heart disease of native coronary artery without angina pectoris: Secondary | ICD-10-CM | POA: Diagnosis not present

## 2019-06-24 DIAGNOSIS — J449 Chronic obstructive pulmonary disease, unspecified: Secondary | ICD-10-CM | POA: Diagnosis not present

## 2019-06-27 ENCOUNTER — Telehealth: Payer: Self-pay | Admitting: Internal Medicine

## 2019-06-27 NOTE — Telephone Encounter (Signed)
Requesting call back in reference to when Dr. Ronnald Ramp last seen patient in office.

## 2019-06-28 DIAGNOSIS — F0390 Unspecified dementia without behavioral disturbance: Secondary | ICD-10-CM | POA: Diagnosis not present

## 2019-06-28 DIAGNOSIS — Z85828 Personal history of other malignant neoplasm of skin: Secondary | ICD-10-CM | POA: Diagnosis not present

## 2019-06-28 DIAGNOSIS — C44722 Squamous cell carcinoma of skin of right lower limb, including hip: Secondary | ICD-10-CM | POA: Diagnosis not present

## 2019-06-28 DIAGNOSIS — M6281 Muscle weakness (generalized): Secondary | ICD-10-CM | POA: Diagnosis not present

## 2019-06-28 DIAGNOSIS — G309 Alzheimer's disease, unspecified: Secondary | ICD-10-CM | POA: Diagnosis not present

## 2019-06-28 DIAGNOSIS — I5032 Chronic diastolic (congestive) heart failure: Secondary | ICD-10-CM | POA: Diagnosis not present

## 2019-06-28 DIAGNOSIS — C44729 Squamous cell carcinoma of skin of left lower limb, including hip: Secondary | ICD-10-CM | POA: Diagnosis not present

## 2019-06-28 DIAGNOSIS — M150 Primary generalized (osteo)arthritis: Secondary | ICD-10-CM | POA: Diagnosis not present

## 2019-06-28 DIAGNOSIS — F0280 Dementia in other diseases classified elsewhere without behavioral disturbance: Secondary | ICD-10-CM | POA: Diagnosis not present

## 2019-06-28 DIAGNOSIS — I4821 Permanent atrial fibrillation: Secondary | ICD-10-CM | POA: Diagnosis not present

## 2019-06-28 DIAGNOSIS — I110 Hypertensive heart disease with heart failure: Secondary | ICD-10-CM | POA: Diagnosis not present

## 2019-06-28 DIAGNOSIS — J449 Chronic obstructive pulmonary disease, unspecified: Secondary | ICD-10-CM | POA: Diagnosis not present

## 2019-06-28 DIAGNOSIS — I2510 Atherosclerotic heart disease of native coronary artery without angina pectoris: Secondary | ICD-10-CM | POA: Diagnosis not present

## 2019-06-28 DIAGNOSIS — I272 Pulmonary hypertension, unspecified: Secondary | ICD-10-CM | POA: Diagnosis not present

## 2019-06-28 DIAGNOSIS — I69398 Other sequelae of cerebral infarction: Secondary | ICD-10-CM | POA: Diagnosis not present

## 2019-06-28 DIAGNOSIS — I071 Rheumatic tricuspid insufficiency: Secondary | ICD-10-CM | POA: Diagnosis not present

## 2019-06-28 NOTE — Telephone Encounter (Signed)
lvm for [employer] to call back at her convenience. You may pull me if needed when she calls back.

## 2019-06-28 NOTE — Telephone Encounter (Signed)
Crystal called back and I informed her that pt was last seen in January of 2020. Crystal stated that she sent over a records release form for pt.

## 2019-06-29 ENCOUNTER — Other Ambulatory Visit: Payer: Self-pay

## 2019-06-29 DIAGNOSIS — I2510 Atherosclerotic heart disease of native coronary artery without angina pectoris: Secondary | ICD-10-CM | POA: Diagnosis not present

## 2019-06-29 DIAGNOSIS — J449 Chronic obstructive pulmonary disease, unspecified: Secondary | ICD-10-CM | POA: Diagnosis not present

## 2019-06-29 DIAGNOSIS — I4821 Permanent atrial fibrillation: Secondary | ICD-10-CM | POA: Diagnosis not present

## 2019-06-29 DIAGNOSIS — I5032 Chronic diastolic (congestive) heart failure: Secondary | ICD-10-CM | POA: Diagnosis not present

## 2019-06-29 DIAGNOSIS — F0280 Dementia in other diseases classified elsewhere without behavioral disturbance: Secondary | ICD-10-CM | POA: Diagnosis not present

## 2019-06-29 DIAGNOSIS — I110 Hypertensive heart disease with heart failure: Secondary | ICD-10-CM | POA: Diagnosis not present

## 2019-06-29 DIAGNOSIS — G309 Alzheimer's disease, unspecified: Secondary | ICD-10-CM | POA: Diagnosis not present

## 2019-06-29 DIAGNOSIS — I69398 Other sequelae of cerebral infarction: Secondary | ICD-10-CM | POA: Diagnosis not present

## 2019-06-29 DIAGNOSIS — M6281 Muscle weakness (generalized): Secondary | ICD-10-CM | POA: Diagnosis not present

## 2019-06-29 NOTE — Patient Outreach (Signed)
[name] Care Management  06/29/2019  Tiffany Charles Oct 22, 1929 [id number]   EMMI-Stroke RED ON EMMI ALERT Day #6 Date:06/15/2019 Red Alert Reason:"Feeling worse overall? Ys New or worsening pain/fver/shortness of breath? Yes Able to eat and drink? No Questions/problems with meds? Yes" Day # 9 Date: 06/18/19 Red Alert Reason: "Sad,hopeless,anxious,empty? Yes"   Multiple attempts to establish contact with patient without success. No response from letter mailed to patient. Case is being closed at this time.    Plan: RN CM will close case at this time.   [name], RN,BSN,CCM [place] Management Telephonic Care Management Coordinator Direct Phone: [phone] Toll Free: [phone] Fax: [phone]

## 2019-06-30 ENCOUNTER — Other Ambulatory Visit: Payer: Self-pay

## 2019-06-30 ENCOUNTER — Encounter: Payer: Self-pay | Admitting: Internal Medicine

## 2019-06-30 ENCOUNTER — Ambulatory Visit (INDEPENDENT_AMBULATORY_CARE_PROVIDER_SITE_OTHER): Payer: Medicare HMO | Admitting: Internal Medicine

## 2019-06-30 VITALS — BP 142/70 | HR 85 | Temp 98.6°F | Resp 16 | Ht 63.0 in | Wt 117.0 lb

## 2019-06-30 DIAGNOSIS — Z0000 Encounter for general adult medical examination without abnormal findings: Secondary | ICD-10-CM

## 2019-06-30 DIAGNOSIS — E7800 Pure hypercholesterolemia, unspecified: Secondary | ICD-10-CM | POA: Diagnosis not present

## 2019-06-30 DIAGNOSIS — I639 Cerebral infarction, unspecified: Secondary | ICD-10-CM | POA: Diagnosis not present

## 2019-06-30 DIAGNOSIS — I4821 Permanent atrial fibrillation: Secondary | ICD-10-CM

## 2019-06-30 DIAGNOSIS — Z23 Encounter for immunization: Secondary | ICD-10-CM

## 2019-06-30 DIAGNOSIS — I10 Essential (primary) hypertension: Secondary | ICD-10-CM | POA: Diagnosis not present

## 2019-06-30 NOTE — Progress Notes (Signed)
Subjective:  Patient ID: Tiffany Charles, female    DOB: 1929/12/31  Age: 86 y.o. MRN: [id number]  CC: Atrial Fibrillation  This visit occurred during the SARS-CoV-2 public health emergency.  Safety protocols were in place, including screening questions prior to the visit, additional usage of staff PPE, and extensive cleaning of exam room while observing appropriate contact time as indicated for disinfecting solutions.    HPI Tiffany Charles presents for f/up - She comes in with her son today.  He tells me she is doing pretty well.  She had small occipital strokes a few weeks ago.  He tells me she is compliant she is compliant with her meds.  Outpatient Medications Prior to Visit  Medication Sig Dispense Refill  . aspirin EC 81 MG tablet Take 81-324 mg by mouth See admin instructions. Take 162 mg by mouth in the morning and 324 mg as needed for chest pain    . metoprolol succinate (TOPROL-XL) 25 MG 24 hr tablet Take 1 tablet (25 mg total) by mouth daily. 90 tablet 0  . Rivaroxaban (XARELTO) 15 MG TABS tablet Take 1 tablet (15 mg total) by mouth daily before supper. (Patient taking differently: Take 15 mg by mouth daily after breakfast. ) 90 tablet 0  . atorvastatin (LIPITOR) 40 MG tablet Take 1 tablet (40 mg total) by mouth daily at 6 PM. (Patient taking differently: Take 40 mg by mouth daily. ) 30 tablet 1   No facility-administered medications prior to visit.    ROS Review of Systems  Constitutional: Negative for diaphoresis and fatigue.  HENT: Negative.  Negative for trouble swallowing.   Eyes: Negative for visual disturbance.  Respiratory: Negative for cough, chest tightness, shortness of breath and wheezing.   Cardiovascular: Negative for chest pain, palpitations and leg swelling.  Gastrointestinal: Negative for abdominal pain, diarrhea, nausea and vomiting.  Endocrine: Negative.   Genitourinary: Negative.  Negative for difficulty urinating.  Musculoskeletal: Negative.   Skin:  Negative.  Negative for color change.  Neurological: Negative.  Negative for weakness and light-headedness.  Hematological: Negative for adenopathy. Does not bruise/bleed easily.  Psychiatric/Behavioral: Negative.     Objective:  BP (!) 142/70 (BP Location: Left Arm, Patient Position: Sitting, Cuff Size: Normal)   Pulse 85   Temp 98.6 F (37 C) (Oral)   Resp 16   Ht 5\' 3"  (1.6 m)   Wt 117 lb (53.1 kg)   SpO2 97%   BMI 20.73 kg/m   BP Readings from Last 3 Encounters:  06/30/19 (!) 142/70  06/13/19 (!) 160/94  06/08/19 128/76    Wt Readings from Last 3 Encounters:  06/30/19 117 lb (53.1 kg)  01/10/19 118 lb 9.6 oz (53.8 kg)  06/30/18 119 lb (54 kg)    Physical Exam Vitals reviewed.  Constitutional:      Appearance: Normal appearance.  HENT:     Nose: Nose normal.     Mouth/Throat:     Mouth: Mucous membranes are moist.  Eyes:     General: No scleral icterus.    Conjunctiva/sclera: Conjunctivae normal.  Cardiovascular:     Rate and Rhythm: Normal rate. Rhythm irregular.     Pulses: Normal pulses.  Pulmonary:     Effort: Pulmonary effort is normal.     Breath sounds: No stridor. No wheezing, rhonchi or rales.  Abdominal:     General: Abdomen is flat.     Palpations: There is no mass.     Tenderness: There is no abdominal  tenderness.  Musculoskeletal:        General: Normal range of motion.     Cervical back: Neck supple.     Right lower leg: No edema.     Left lower leg: No edema.  Lymphadenopathy:     Cervical: No cervical adenopathy.  Skin:    General: Skin is warm and dry.     Coloration: Skin is not pale.  Neurological:     General: No focal deficit present.     Mental Status: She is alert. Mental status is at baseline.  Psychiatric:        Mood and Affect: Mood normal.     Lab Results  Component Value Date   WBC 9.3 06/13/2019   HGB 12.9 06/13/2019   HCT 38.6 06/13/2019   PLT 203 06/13/2019   GLUCOSE 97 06/13/2019   CHOL 181 06/08/2019    TRIG 59 06/08/2019   HDL 55 06/08/2019   LDLCALC 114 (H) 06/08/2019   ALT 10 06/06/2019   AST 28 06/06/2019   NA 138 06/13/2019   K 3.7 06/13/2019   CL 101 06/13/2019   CREATININE 0.85 06/13/2019   BUN 7 (L) 06/13/2019   CO2 29 06/13/2019   TSH 1.50 06/08/2018   INR 1.0 06/03/2019   HGBA1C 5.0 06/08/2019    DG Chest 1 View  Result Date: 06/13/2019 CLINICAL DATA:  Left chest pain and radiating down the left arm. EXAM: CHEST  1 VIEW COMPARISON:  06/05/2019 FINDINGS: The patient is rotated to the right on today's radiograph, reducing diagnostic sensitivity and specificity. Suspected mild enlargement of the cardiopericardial silhouette. No edema. Linear subsegmental atelectasis or scarring at the right lung base. No blunting of the costophrenic angles. Atherosclerotic calcification of the aortic arch. IMPRESSION: 1. Mild enlargement of the cardiopericardial silhouette, without edema. 2. Subsegmental atelectasis or scarring at the right lung base. 3.  Aortic Atherosclerosis (ICD10-I70.0). Electronically Signed   By: Van Clines M.D.   On: 06/13/2019 20:02    Assessment & Plan:   Tiffany Charles was seen today for atrial fibrillation.  Diagnoses and all orders for this visit:  Occipital stroke ([place])- No sequela or complications noted.  Will continue to work on risk factor modifications.  Permanent atrial fibrillation- She has adequate rate control.  Will continue the current dose of metoprolol.  Will continue the DOAC to reduce the risk of additional strokes.  Routine general medical examination at a health care facility- Exam completed, labs reviewed, vaccines reviewed and updated, no cancer screenings are indicated, patient education was given.  Need for influenza vaccination -     Flu Vaccine QUAD High Dose(Fluad)  Essential hypertension- Her blood pressure is adequately well controlled.  Recent electrolytes and renal function were normal.   I have changed Tiffany Charles's  atorvastatin. I am also having her maintain her metoprolol succinate, Rivaroxaban, and aspirin EC.  Meds ordered this encounter  Medications  . atorvastatin (LIPITOR) 40 MG tablet    Sig: Take 1 tablet (40 mg total) by mouth daily.    Dispense:  90 tablet    Refill:  1     Follow-up: No follow-ups on file.  Scarlette Calico, MD

## 2019-07-01 ENCOUNTER — Encounter: Payer: Self-pay | Admitting: Internal Medicine

## 2019-07-01 MED ORDER — ATORVASTATIN CALCIUM 40 MG PO TABS: 40.0000 mg | ORAL_TABLET | Freq: Every day | ORAL | 1 refills | Status: DC

## 2019-07-01 NOTE — Patient Instructions (Signed)
Health Maintenance, Female Adopting a healthy lifestyle and getting preventive care are important in promoting health and wellness. Ask your health care provider about:  The right schedule for you to have regular tests and exams.  Things you can do on your own to prevent diseases and keep yourself healthy. What should I know about diet, weight, and exercise? Eat a healthy diet   Eat a diet that includes plenty of vegetables, fruits, low-fat dairy products, and lean protein.  Do not eat a lot of foods that are high in solid fats, added sugars, or sodium. Maintain a healthy weight Body mass index (BMI) is used to identify weight problems. It estimates body fat based on height and weight. Your health care provider can help determine your BMI and help you achieve or maintain a healthy weight. Get regular exercise Get regular exercise. This is one of the most important things you can do for your health. Most adults should:  Exercise for at least 150 minutes each week. The exercise should increase your heart rate and make you sweat (moderate-intensity exercise).  Do strengthening exercises at least twice a week. This is in addition to the moderate-intensity exercise.  Spend less time sitting. Even light physical activity can be beneficial. Watch cholesterol and blood lipids Have your blood tested for lipids and cholesterol at 84 years of age, then have this test every 5 years. Have your cholesterol levels checked more often if:  Your lipid or cholesterol levels are high.  You are older than 84 years of age.  You are at high risk for heart disease. What should I know about cancer screening? Depending on your health history and family history, you may need to have cancer screening at various ages. This may include screening for:  Breast cancer.  Cervical cancer.  Colorectal cancer.  Skin cancer.  Lung cancer. What should I know about heart disease, diabetes, and high blood  pressure? Blood pressure and heart disease  High blood pressure causes heart disease and increases the risk of stroke. This is more likely to develop in people who have high blood pressure readings, are of African descent, or are overweight.  Have your blood pressure checked: ? Every 3-5 years if you are 18-39 years of age. ? Every year if you are 40 years old or older. Diabetes Have regular diabetes screenings. This checks your fasting blood sugar level. Have the screening done:  Once every three years after age 40 if you are at a normal weight and have a low risk for diabetes.  More often and at a younger age if you are overweight or have a high risk for diabetes. What should I know about preventing infection? Hepatitis B If you have a higher risk for hepatitis B, you should be screened for this virus. Talk with your health care provider to find out if you are at risk for hepatitis B infection. Hepatitis C Testing is recommended for:  Everyone born from 1945 through 1965.  Anyone with known risk factors for hepatitis C. Sexually transmitted infections (STIs)  Get screened for STIs, including gonorrhea and chlamydia, if: ? You are sexually active and are younger than 84 years of age. ? You are older than 84 years of age and your health care provider tells you that you are at risk for this type of infection. ? Your sexual activity has changed since you were last screened, and you are at increased risk for chlamydia or gonorrhea. Ask your health care provider if   you are at risk.  Ask your health care provider about whether you are at high risk for HIV. Your health care provider may recommend a prescription medicine to help prevent HIV infection. If you choose to take medicine to prevent HIV, you should first get tested for HIV. You should then be tested every 3 months for as long as you are taking the medicine. Pregnancy  If you are about to stop having your period (premenopausal) and  you may become pregnant, seek counseling before you get pregnant.  Take 400 to 800 micrograms (mcg) of folic acid every day if you become pregnant.  Ask for birth control (contraception) if you want to prevent pregnancy. Osteoporosis and menopause Osteoporosis is a disease in which the bones lose minerals and strength with aging. This can result in bone fractures. If you are 65 years old or older, or if you are at risk for osteoporosis and fractures, ask your health care provider if you should:  Be screened for bone loss.  Take a calcium or vitamin D supplement to lower your risk of fractures.  Be given hormone replacement therapy (HRT) to treat symptoms of menopause. Follow these instructions at home: Lifestyle  Do not use any products that contain nicotine or tobacco, such as cigarettes, e-cigarettes, and chewing tobacco. If you need help quitting, ask your health care provider.  Do not use street drugs.  Do not share needles.  Ask your health care provider for help if you need support or information about quitting drugs. Alcohol use  Do not drink alcohol if: ? Your health care provider tells you not to drink. ? You are pregnant, may be pregnant, or are planning to become pregnant.  If you drink alcohol: ? Limit how much you use to 0-1 drink a day. ? Limit intake if you are breastfeeding.  Be aware of how much alcohol is in your drink. In the [place] one drink equals one 12 oz bottle of beer (355 mL), one 5 oz glass of wine (148 mL), or one 1 oz glass of hard liquor (44 mL). General instructions  Schedule regular health, dental, and eye exams.  Stay current with your vaccines.  Tell your health care provider if: ? You often feel depressed. ? You have ever been abused or do not feel safe at home. Summary  Adopting a healthy lifestyle and getting preventive care are important in promoting health and wellness.  Follow your health care provider's instructions about healthy  diet, exercising, and getting tested or screened for diseases.  Follow your health care provider's instructions on monitoring your cholesterol and blood pressure. This information is not intended to replace advice given to you by your health care provider. Make sure you discuss any questions you have with your health care provider. Document Revised: 05/05/2018 Document Reviewed: 05/05/2018 Elsevier Patient Education  2020 [employer]  

## 2019-07-01 NOTE — Assessment & Plan Note (Signed)
She has achieved her LDL goal is doing well on the statin.

## 2019-07-05 ENCOUNTER — Ambulatory Visit: Payer: Medicare HMO

## 2019-07-06 DIAGNOSIS — I69398 Other sequelae of cerebral infarction: Secondary | ICD-10-CM | POA: Diagnosis not present

## 2019-07-06 DIAGNOSIS — I4821 Permanent atrial fibrillation: Secondary | ICD-10-CM | POA: Diagnosis not present

## 2019-07-06 DIAGNOSIS — F0280 Dementia in other diseases classified elsewhere without behavioral disturbance: Secondary | ICD-10-CM | POA: Diagnosis not present

## 2019-07-06 DIAGNOSIS — J449 Chronic obstructive pulmonary disease, unspecified: Secondary | ICD-10-CM | POA: Diagnosis not present

## 2019-07-06 DIAGNOSIS — I110 Hypertensive heart disease with heart failure: Secondary | ICD-10-CM | POA: Diagnosis not present

## 2019-07-06 DIAGNOSIS — I5032 Chronic diastolic (congestive) heart failure: Secondary | ICD-10-CM | POA: Diagnosis not present

## 2019-07-06 DIAGNOSIS — I2510 Atherosclerotic heart disease of native coronary artery without angina pectoris: Secondary | ICD-10-CM | POA: Diagnosis not present

## 2019-07-06 DIAGNOSIS — G309 Alzheimer's disease, unspecified: Secondary | ICD-10-CM | POA: Diagnosis not present

## 2019-07-06 DIAGNOSIS — M6281 Muscle weakness (generalized): Secondary | ICD-10-CM | POA: Diagnosis not present

## 2019-07-08 DIAGNOSIS — J449 Chronic obstructive pulmonary disease, unspecified: Secondary | ICD-10-CM | POA: Diagnosis not present

## 2019-07-08 DIAGNOSIS — I5032 Chronic diastolic (congestive) heart failure: Secondary | ICD-10-CM | POA: Diagnosis not present

## 2019-07-08 DIAGNOSIS — Z9181 History of falling: Secondary | ICD-10-CM

## 2019-07-08 DIAGNOSIS — I110 Hypertensive heart disease with heart failure: Secondary | ICD-10-CM | POA: Diagnosis not present

## 2019-07-08 DIAGNOSIS — I2510 Atherosclerotic heart disease of native coronary artery without angina pectoris: Secondary | ICD-10-CM | POA: Diagnosis not present

## 2019-07-08 DIAGNOSIS — Z8673 Personal history of transient ischemic attack (TIA), and cerebral infarction without residual deficits: Secondary | ICD-10-CM

## 2019-07-08 DIAGNOSIS — F329 Major depressive disorder, single episode, unspecified: Secondary | ICD-10-CM

## 2019-07-08 DIAGNOSIS — F0390 Unspecified dementia without behavioral disturbance: Secondary | ICD-10-CM | POA: Diagnosis not present

## 2019-07-08 DIAGNOSIS — M150 Primary generalized (osteo)arthritis: Secondary | ICD-10-CM | POA: Diagnosis not present

## 2019-07-08 DIAGNOSIS — I4821 Permanent atrial fibrillation: Secondary | ICD-10-CM | POA: Diagnosis not present

## 2019-07-08 DIAGNOSIS — Z7901 Long term (current) use of anticoagulants: Secondary | ICD-10-CM

## 2019-07-08 DIAGNOSIS — E785 Hyperlipidemia, unspecified: Secondary | ICD-10-CM

## 2019-07-08 DIAGNOSIS — Z87891 Personal history of nicotine dependence: Secondary | ICD-10-CM

## 2019-07-08 DIAGNOSIS — Z85828 Personal history of other malignant neoplasm of skin: Secondary | ICD-10-CM

## 2019-07-08 DIAGNOSIS — I272 Pulmonary hypertension, unspecified: Secondary | ICD-10-CM | POA: Diagnosis not present

## 2019-07-08 DIAGNOSIS — I071 Rheumatic tricuspid insufficiency: Secondary | ICD-10-CM | POA: Diagnosis not present

## 2019-07-12 ENCOUNTER — Inpatient Hospital Stay: Payer: Self-pay | Admitting: Adult Health

## 2019-07-12 NOTE — Progress Notes (Deleted)
[facility] [address] [place]. [place] [address] [phone]       HOSPITAL FOLLOW UP NOTE  Ms. Tiffany Charles Date of Birth:  May 14, 1930 Medical Record Number:  [id number]   Reason for Referral:  hospital stroke follow up    CHIEF COMPLAINT:  No chief complaint on file.   HPI: Tiffany Charles being seen today for in office hospital follow-up regarding right PCA and MCA/PCA infarct secondary to known atrial fibrillation noncompliant with Xarelto on 06/06/2019.  History obtained from *** and chart review. Reviewed all radiology images and labs personally.  Ms. Tiffany Charles is a 86 y.o. female with history of of tricuspid regurgitation, permanent atrial fibrillation on Xarelto, hyperlipidemia, essential hypertension, CAD  who presented on 06/06/2019 with recurrent generalized weakness w/ falls 1/8, 1/10 and now 1/12.  Evaluated by stroke team and Dr. Erlinda Hong with stroke work-up revealing right PCA and MCA/PCA infarcts as evidenced on MRI secondary to known atrial fibrillation noncompliance with Xarelto.  CTA head/neck negative LVO but evidence of aortic arthrosclerosis, right ICA 30% stenosis, right VA with arthrosclerosis and narrowing and generalized arthrosclerosis.  2D echo unremarkable.  Advised to restart Xarelto for history of atrial fibrillation to ensure daily compliance as previously dosed several months back around 2 weeks prior to recent stroke. HTN stable. LDL 114 and switched pravastatin to atorvastatin 40 mg daily.  No history or evidence of DM with A1c 5.0.  Current tobacco use cessation counseling provided.  Patient is factors include advanced age, family history of stroke, CAD status post stent and chronic diastolic CHF but no prior history of stroke.  Other active problems include Alzheimer's dementia, COPD and tricuspid regurgitation.  Discharged home with son with recommendation of home health PT/OT.     ROS:   14 system review of systems performed and  negative with exception of ***  PMH:  Past Medical History:  Diagnosis Date  . Angina pectoris   . Arthritis   . Chronic anticoagulation    Xarelto  . Chronic diastolic (congestive) heart failure ([place])   . COPD (chronic obstructive pulmonary disease) ([place])   . Coronary artery disease    a. s/p RCA stent 2008. b. cath 02/2009: 30% mLAD, 90% AV groove cx unchanged from prior, 50-60% RCA, EF 60% -> med rx.  . Depression   . Essential hypertension   . Hyperlipidemia   . Mild pulmonary hypertension ([place]) 09/17/2016  . Permanent atrial fibrillation ([place])   . Skin cancer Jan 2013   squamous cell- removed  . Tricuspid regurgitation 09/17/2016    PSH:  Past Surgical History:  Procedure Laterality Date  .  CATARACTS REMOVED    . ABDOMINAL HYSTERECTOMY    . CARDIAC CATHETERIZATION  03/22/2009   Started medical therapy  . CARDIOVERSION  06/05/2009   3 attempts, last attempt successful  . CORONARY ANGIOPLASTY WITH STENT PLACEMENT  2009   RCA Vision stent  . HIP SURGERY    . MELANOMA EXCISION  06/06/2011   Procedure: MELANOMA EXCISION;  Surgeon: Adin Hector, MD;  Location: [facility];  Service: General;  Laterality: Left;  re excision squamous cell lesion left chest wall     Social History:  Social History   Socioeconomic History  . Marital status: Widowed    Spouse name: Not on file  . Number of children: 2  . Years of education: Not on file  . Highest education level: Not on file  Occupational History  . Occupation: Retired  Tobacco Use  . Smoking status: Former Smoker    Packs/day: 0.50  . Smokeless tobacco: Never Used  Substance and Sexual Activity  . Alcohol use: No  . Drug use: No  . Sexual activity: Never  Other Topics Concern  . Not on file  Social History Narrative   Pt lives alone, son is nearby.   Social Determinants of Health   Financial Resource Strain:   . Difficulty of Paying Living Expenses: Not on file  Food Insecurity:   . Worried About Ship broker in the Last Year: Not on file  . Ran Out of Food in the Last Year: Not on file  Transportation Needs:   . Lack of Transportation (Medical): Not on file  . Lack of Transportation (Non-Medical): Not on file  Physical Activity:   . Days of Exercise per Week: Not on file  . Minutes of Exercise per Session: Not on file  Stress:   . Feeling of Stress : Not on file  Social Connections:   . Frequency of Communication with Friends and Family: Not on file  . Frequency of Social Gatherings with Friends and Family: Not on file  . Attends Religious Services: Not on file  . Active Member of Clubs or Organizations: Not on file  . Attends Archivist Meetings: Not on file  . Marital Status: Not on file  Intimate Partner Violence:   . Fear of Current or Ex-Partner: Not on file  . Emotionally Abused: Not on file  . Physically Abused: Not on file  . Sexually Abused: Not on file    Family History:  Family History  Problem Relation Age of Onset  . Stroke Mother   . Heart disease Mother   . Coronary artery disease Father   . Heart disease Father   . Stroke Sister   . Heart disease Brother   . Stroke Sister   . Stroke Sister     Medications:   Current Outpatient Medications on File Prior to Visit  Medication Sig Dispense Refill  . aspirin EC 81 MG tablet Take 81-324 mg by mouth See admin instructions. Take 162 mg by mouth in the morning and 324 mg as needed for chest pain    . atorvastatin (LIPITOR) 40 MG tablet Take 1 tablet (40 mg total) by mouth daily. 90 tablet 1  . metoprolol succinate (TOPROL-XL) 25 MG 24 hr tablet Take 1 tablet (25 mg total) by mouth daily. 90 tablet 0  . Rivaroxaban (XARELTO) 15 MG TABS tablet Take 1 tablet (15 mg total) by mouth daily before supper. (Patient taking differently: Take 15 mg by mouth daily after breakfast. ) 90 tablet 0   No current facility-administered medications on file prior to visit.    Allergies:   Allergies  Allergen  Reactions  . Codeine Nausea And Vomiting     Physical Exam  There were no vitals filed for this visit. There is no height or weight on file to calculate BMI. No exam data present  Depression screen [facility] 2/9 06/30/2018  Decreased Interest 0  Down, Depressed, Hopeless 0  PHQ - 2 Score 0  Altered sleeping 0  Tired, decreased energy 1  Change in appetite 0  Feeling bad or failure about yourself  0  Trouble concentrating 3  Moving slowly or fidgety/restless 0  Suicidal thoughts 0  PHQ-9 Score 4  Difficult doing work/chores Not difficult at all     General: well developed, well nourished, seated, in no  evident distress Head: head normocephalic and atraumatic.   Neck: supple with no carotid or supraclavicular bruits Cardiovascular: regular rate and rhythm, no murmurs Musculoskeletal: no deformity Skin:  no rash/petichiae Vascular:  Normal pulses all extremities   Neurologic Exam Mental Status: Awake and fully alert. Oriented to place and time. Recent and remote memory intact. Attention span, concentration and fund of knowledge appropriate. Mood and affect appropriate.  Cranial Nerves: Fundoscopic exam reveals sharp disc margins. Pupils equal, briskly reactive to light. Extraocular movements full without nystagmus. Visual fields full to confrontation. Hearing intact. Facial sensation intact. Face, tongue, palate moves normally and symmetrically.  Motor: Normal bulk and tone. Normal strength in all tested extremity muscles. Sensory.: intact to touch , pinprick , position and vibratory sensation.  Coordination: Rapid alternating movements normal in all extremities. Finger-to-nose and heel-to-shin performed accurately bilaterally. Gait and Station: Arises from chair without difficulty. Stance is normal. Gait demonstrates normal stride length and balance Reflexes: 1+ and symmetric. Toes downgoing.     NIHSS  *** Modified Rankin  *** CHA2DS2-VASc 8 HAS-BLED 2   Diagnostic Data  (Labs, Imaging, Testing)   CT Head and Spine 1/8 no acute injury. Small vessel disease.   MRI 1/12 several small R occipital cortical infarcts. Small vessel disease. Atrophy.   CTA head & neck no LVO. Aortic atherosclerosis. R ICA 30% stenosis. R VA w/ atherosclerosis and narrowing. Generalized atherosclerosis.   2D Echo EF 60-65%. No source of embolus   LDL 114  HgbA1c 5.0    ASSESSMENT: Tiffany Charles is a 86 y.o. year old female presented with generalized weakness with multiple falls on 06/06/2019 with stroke work-up revealing right PCA and MCA/PCA infarcts secondary to known atrial fibrillation and Xarelto noncompliance. Vascular risk factors include atrial fibrillation on Xarelto, HLD, HTN, CAD status post stent and CHF.     PLAN:  1. Right PCA and MCA/PCA stroke: Continue Xarelto (rivaroxaban) daily  and atorvastatin for secondary stroke prevention. Maintain strict control of hypertension with blood pressure goal below 130/90, diabetes with hemoglobin A1c goal below 6.5% and cholesterol with LDL cholesterol (bad cholesterol) goal below 70 mg/dL.  I also advised the patient to eat a healthy diet with plenty of whole grains, cereals, fruits and vegetables, exercise regularly with at least 30 minutes of continuous activity daily and maintain ideal body weight. 2. HTN: Advised to continue current treatment regimen.  Today's BP ***.  Advised to continue to monitor at home along with continued follow-up with PCP for management 3. HLD: Advised to continue current treatment regimen along with continued follow-up with PCP for future prescribing and monitoring of lipid panel 4. Atrial fibrillation: Ensure ongoing compliance with Xarelto and follow-up with cardiology for monitoring/management    Follow up in *** or call earlier if needed   Greater than 50% of time during this 45 minute visit was spent on counseling, explanation of diagnosis of ***, reviewing risk factor management of ***,  planning of further management along with potential future management, and discussion with patient and family answering all questions.    Frann Rider, AGNP-BC  [facility] [address] [place], [place] [address]  Phone [phone] Fax [phone] Note: This document was prepared with digital dictation and possible smart phrase technology. Any transcriptional errors that result from this process are unintentional.

## 2019-07-13 DIAGNOSIS — I110 Hypertensive heart disease with heart failure: Secondary | ICD-10-CM | POA: Diagnosis not present

## 2019-07-13 DIAGNOSIS — J449 Chronic obstructive pulmonary disease, unspecified: Secondary | ICD-10-CM | POA: Diagnosis not present

## 2019-07-13 DIAGNOSIS — M6281 Muscle weakness (generalized): Secondary | ICD-10-CM | POA: Diagnosis not present

## 2019-07-13 DIAGNOSIS — I4821 Permanent atrial fibrillation: Secondary | ICD-10-CM | POA: Diagnosis not present

## 2019-07-13 DIAGNOSIS — G309 Alzheimer's disease, unspecified: Secondary | ICD-10-CM | POA: Diagnosis not present

## 2019-07-13 DIAGNOSIS — F0280 Dementia in other diseases classified elsewhere without behavioral disturbance: Secondary | ICD-10-CM | POA: Diagnosis not present

## 2019-07-13 DIAGNOSIS — I5032 Chronic diastolic (congestive) heart failure: Secondary | ICD-10-CM | POA: Diagnosis not present

## 2019-07-13 DIAGNOSIS — I69398 Other sequelae of cerebral infarction: Secondary | ICD-10-CM | POA: Diagnosis not present

## 2019-07-13 DIAGNOSIS — I2510 Atherosclerotic heart disease of native coronary artery without angina pectoris: Secondary | ICD-10-CM | POA: Diagnosis not present

## 2019-07-18 ENCOUNTER — Encounter: Payer: Self-pay | Admitting: Adult Health

## 2019-07-20 ENCOUNTER — Emergency Department (HOSPITAL_COMMUNITY): Payer: Medicare HMO

## 2019-07-20 ENCOUNTER — Encounter (HOSPITAL_COMMUNITY): Payer: Self-pay

## 2019-07-20 ENCOUNTER — Emergency Department (HOSPITAL_COMMUNITY)
Admission: EM | Admit: 2019-07-20 | Discharge: 2019-07-20 | Disposition: A | Discharge status: Home / Self Care | Payer: Medicare HMO | Attending: Emergency Medicine | Admitting: Emergency Medicine

## 2019-07-20 ENCOUNTER — Other Ambulatory Visit: Payer: Self-pay

## 2019-07-20 DIAGNOSIS — Y999 Unspecified external cause status: Secondary | ICD-10-CM | POA: Insufficient documentation

## 2019-07-20 DIAGNOSIS — G309 Alzheimer's disease, unspecified: Secondary | ICD-10-CM | POA: Diagnosis not present

## 2019-07-20 DIAGNOSIS — I959 Hypotension, unspecified: Secondary | ICD-10-CM | POA: Diagnosis not present

## 2019-07-20 DIAGNOSIS — Z7982 Long term (current) use of aspirin: Secondary | ICD-10-CM | POA: Insufficient documentation

## 2019-07-20 DIAGNOSIS — I2510 Atherosclerotic heart disease of native coronary artery without angina pectoris: Secondary | ICD-10-CM | POA: Insufficient documentation

## 2019-07-20 DIAGNOSIS — Y9301 Activity, walking, marching and hiking: Secondary | ICD-10-CM | POA: Diagnosis not present

## 2019-07-20 DIAGNOSIS — I4891 Unspecified atrial fibrillation: Secondary | ICD-10-CM | POA: Insufficient documentation

## 2019-07-20 DIAGNOSIS — I110 Hypertensive heart disease with heart failure: Secondary | ICD-10-CM | POA: Insufficient documentation

## 2019-07-20 DIAGNOSIS — Y929 Unspecified place or not applicable: Secondary | ICD-10-CM | POA: Insufficient documentation

## 2019-07-20 DIAGNOSIS — I69398 Other sequelae of cerebral infarction: Secondary | ICD-10-CM | POA: Diagnosis not present

## 2019-07-20 DIAGNOSIS — S91312A Laceration without foreign body, left foot, initial encounter: Secondary | ICD-10-CM | POA: Diagnosis not present

## 2019-07-20 DIAGNOSIS — W208XXA Other cause of strike by thrown, projected or falling object, initial encounter: Secondary | ICD-10-CM | POA: Diagnosis not present

## 2019-07-20 DIAGNOSIS — Z87891 Personal history of nicotine dependence: Secondary | ICD-10-CM | POA: Insufficient documentation

## 2019-07-20 DIAGNOSIS — Z85828 Personal history of other malignant neoplasm of skin: Secondary | ICD-10-CM | POA: Insufficient documentation

## 2019-07-20 DIAGNOSIS — I10 Essential (primary) hypertension: Secondary | ICD-10-CM | POA: Diagnosis not present

## 2019-07-20 DIAGNOSIS — I5032 Chronic diastolic (congestive) heart failure: Secondary | ICD-10-CM | POA: Diagnosis not present

## 2019-07-20 DIAGNOSIS — J449 Chronic obstructive pulmonary disease, unspecified: Secondary | ICD-10-CM | POA: Insufficient documentation

## 2019-07-20 DIAGNOSIS — Z7901 Long term (current) use of anticoagulants: Secondary | ICD-10-CM | POA: Insufficient documentation

## 2019-07-20 DIAGNOSIS — S99922A Unspecified injury of left foot, initial encounter: Secondary | ICD-10-CM | POA: Diagnosis not present

## 2019-07-20 DIAGNOSIS — R58 Hemorrhage, not elsewhere classified: Secondary | ICD-10-CM | POA: Diagnosis not present

## 2019-07-20 DIAGNOSIS — M6281 Muscle weakness (generalized): Secondary | ICD-10-CM | POA: Diagnosis not present

## 2019-07-20 DIAGNOSIS — F0280 Dementia in other diseases classified elsewhere without behavioral disturbance: Secondary | ICD-10-CM | POA: Diagnosis not present

## 2019-07-20 DIAGNOSIS — F0390 Unspecified dementia without behavioral disturbance: Secondary | ICD-10-CM | POA: Diagnosis not present

## 2019-07-20 DIAGNOSIS — I4821 Permanent atrial fibrillation: Secondary | ICD-10-CM | POA: Diagnosis not present

## 2019-07-20 MED ORDER — CEPHALEXIN 250 MG PO CAPS: 250.0000 mg | ORAL_CAPSULE | Freq: Three times a day (TID) | ORAL | 0 refills | Status: AC

## 2019-07-20 MED ORDER — LIDOCAINE-EPINEPHRINE (PF) 2 %-1:200000 IJ SOLN
10.0000 mL | Freq: Once | INTRAMUSCULAR | Status: AC
  Administered 2019-07-20: 10 mL
  Filled 2019-07-20: qty 20

## 2019-07-20 NOTE — ED Provider Notes (Signed)
[facility] EMERGENCY DEPARTMENT Provider Note   CSN: [id number] Arrival date & time: 07/20/19  1311     History Chief Complaint  Patient presents with  . Extremity Laceration    Tiffany Charles is a 86 y.o. female presenting for evaluation of a left foot laceration.  Level 5 caveat due to dementia.  Per EMS, patient tripped and the walker landed on her foot.  She has a  laceration to the top of her foot.  She takes Xarelto.  Baseline mental status at this time.  Patient reports injury to her foot.  Denies injury elsewhere.  Reports mild pain.  Denies numbness.  HPI     Past Medical History:  Diagnosis Date  . Angina pectoris   . Arthritis   . Chronic anticoagulation    Xarelto  . Chronic diastolic (congestive) heart failure ([place])   . COPD (chronic obstructive pulmonary disease) ([place])   . Coronary artery disease    a. s/p RCA stent 2008. b. cath 02/2009: 30% mLAD, 90% AV groove cx unchanged from prior, 50-60% RCA, EF 60% -> med rx.  . Depression   . Essential hypertension   . Hyperlipidemia   . Mild pulmonary hypertension ([place]) 09/17/2016  . Permanent atrial fibrillation ([place])   . Skin cancer Jan 2013   squamous cell- removed  . Tricuspid regurgitation 09/17/2016    Patient Active Problem List   Diagnosis Date Noted  . DNR (do not resuscitate) 06/08/2019  . Occipital stroke ([place]) 06/07/2019  . Squamous cell carcinoma, leg, unspecified laterality 06/08/2018  . Basal cell carcinoma (BCC) of nasal tip 06/08/2018  . Senile dementia without behavioral disturbance ([place]) 04/29/2017  . Frequent PVCs 09/17/2016  . Tricuspid regurgitation 09/17/2016  . Mild pulmonary hypertension ([place]) 09/17/2016  . Chronic diastolic (congestive) heart failure ([place])   . Essential hypertension   . Generalized OA 12/28/2014  . Routine general medical examination at a health care facility 11/09/2013  . Depression, major ([place]) 11/07/2013  . Chronic anticoagulation-  Xarelto 12/28/2012  . Permanent atrial fibrillation 10/28/2012  . COPD - PFTs in June 2014 with MET test 10/28/2012  . CAD, RCA BMS '09, 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 low risk Myoview Feb 2013 10/28/2012  . Hyperlipidemia 10/28/2012    Past Surgical History:  Procedure Laterality Date  .  CATARACTS REMOVED    . ABDOMINAL HYSTERECTOMY    . CARDIAC CATHETERIZATION  03/22/2009   Started medical therapy  . CARDIOVERSION  06/05/2009   3 attempts, last attempt successful  . CORONARY ANGIOPLASTY WITH STENT PLACEMENT  2009   RCA Vision stent  . HIP SURGERY    . MELANOMA EXCISION  06/06/2011   Procedure: MELANOMA EXCISION;  Surgeon: Adin Hector, MD;  Location: [facility];  Service: General;  Laterality: Left;  re excision squamous cell lesion left chest wall      OB History   No obstetric history on file.     Family History  Problem Relation Age of Onset  . Stroke Mother   . Heart disease Mother   . Coronary artery disease Father   . Heart disease Father   . Stroke Sister   . Heart disease Brother   . Stroke Sister   . Stroke Sister     Social History   Tobacco Use  . Smoking status: Former Smoker    Packs/day: 0.50  . Smokeless tobacco: Never Used  Substance Use Topics  . Alcohol use: No  .  Drug use: No    Home Medications Prior to Admission medications   Medication Sig Start Date End Date Taking? Authorizing Provider  aspirin EC 81 MG tablet Take 81-324 mg by mouth See admin instructions. Take 162 mg by mouth in the morning and 324 mg as needed for chest pain    [provider]  atorvastatin (LIPITOR) 40 MG tablet Take 1 tablet (40 mg total) by mouth daily. 07/01/19   [name], MD  cephALEXin (KEFLEX) 250 MG capsule Take 1 capsule (250 mg total) by mouth 3 (three) times daily for 5 days. 07/20/19 07/25/19  Emmanuel Ercole, PA-C  metoprolol succinate (TOPROL-XL) 25 MG 24 hr tablet Take 1 tablet (25 mg total) by mouth daily. 05/05/19   Croitoru,  Mihai, MD  Rivaroxaban (XARELTO) 15 MG TABS tablet Take 1 tablet (15 mg total) by mouth daily before supper. Patient taking differently: Take 15 mg by mouth daily after breakfast.  05/05/19   Croitoru, Mihai, MD    Allergies    Codeine  Review of Systems   Review of Systems  Unable to perform ROS: Dementia  Skin: Positive for wound.  Hematological: Bruises/bleeds easily.    Physical Exam Updated Vital Signs BP (!) 154/68 (BP Location: Right Arm)   Pulse 78   Temp 98.4 F (36.9 C) (Oral)   Resp 18   Ht 5' 3"  (1.6 m)   Wt 53 kg   SpO2 98%   BMI 20.70 kg/m   Physical Exam Vitals and nursing note reviewed.  Constitutional:      General: She is not in acute distress.    Appearance: She is well-developed.  HENT:     Head: Normocephalic and atraumatic.  Pulmonary:     Effort: Pulmonary effort is normal.  Abdominal:     General: There is no distension.  Musculoskeletal:        General: Swelling, tenderness and signs of injury present. Normal range of motion.     Cervical back: Normal range of motion.     Comments: See picture below.  6 cm laceration to the dorsum of the left foot with minimal bleeding.  Large underlying hematoma.  Tenderness to palpation of the dorsal foot.  Full active range of motion of the toes without difficulty.  Good distal sensation.  Good distal cap refill.  Skin:    General: Skin is warm.     Capillary Refill: Capillary refill takes less than 2 seconds.     Findings: No rash.  Neurological:     Mental Status: She is alert.     Comments: Oriented to person and place, minimally to event        ED Results / Procedures / Treatments   Labs (all labs ordered are listed, but only abnormal results are displayed) Labs Reviewed - No data to display  EKG None  Radiology DG Foot Complete Left  Result Date: 07/20/2019 CLINICAL DATA:  Fall EXAM: LEFT FOOT - COMPLETE 3+ VIEW COMPARISON:  None. FINDINGS: Negative for fracture. Mild hallux valgus  deformity. Extension deformities of the second through fifth metatarsal phalangeal joint. Soft tissue swelling and gas in the soft tissues dorsally due to contusion and laceration. IMPRESSION: Negative for fracture. Electronically Signed   By: Franchot Gallo M.D.   On: 07/20/2019 14:41    Procedures Procedures (including critical care time)  Medications Ordered in ED Medications  lidocaine-EPINEPHrine (XYLOCAINE W/EPI) 2 %-1:200000 (PF) injection 10 mL (10 mLs Infiltration Given by Other 07/20/19 1419)  ED Course  I have reviewed the triage vital signs and the nursing notes.  Pertinent labs & imaging results that were available during my care of the patient were reviewed by me and considered in my medical decision making (see chart for details).    MDM Rules/Calculators/A&P                       Pt presenting for evaluation of left foot laceration.  Physical exam shows patient who appears nontoxic.  Neurovascularly intact.  She has a large laceration with underlying hematoma to the dorsum of the left foot. Will obtain X-ray to r/o fracture. Case discussed with attending, Dr. Alvino Chapel evaluated the pt.   X-ray viewed interpreted by me, no fracture or dislocation.  Area was numbed with lidocaine with epi.  Irrigated hematoma as much as possible.  Due to patient's very thin skin, unable to suture without causing more harm.  Laceration is not a candidate for Dermabond or Steri-Strips or Dermabond at this time.  As such, wound was cleaned and dressing applied.  Will place patient on antibiotics as she has an open nonhealing wound.  Discussed findings and plan with patient and son.  Discussed close monitoring with primary care for wound recheck.  At this time, patient appears safe for discharge.  Return precautions given.  Patient and son state they understand and agree to plan.   Final Clinical Impression(s) / ED Diagnoses Final diagnoses:  Foot laceration, left, initial encounter    Rx /  DC Orders ED Discharge Orders         Ordered    cephALEXin (KEFLEX) 250 MG capsule  3 times daily     07/20/19 1511           Franchot Heidelberg, PA-C 07/20/19 1537    Davonna Belling, MD 07/22/19 2311

## 2019-07-20 NOTE — ED Triage Notes (Signed)
Pt BIB [facility] for eval of L sided foot lac. EMS reports pt tripped and walker landed on her foot. Skin tear to surface of foot. Bleeding controlled on scene, bandage in place. Pt w/ hx of dementia, per EMS acting appropriately. Pt takes xarelto.

## 2019-07-20 NOTE — Discharge Instructions (Addendum)
Take antibiotics as prescribed. Use Tylenol and ice to help with pain and swelling. Wash every day with soap and water.  Reapply new dressing or Band-Aid every day. If there is bleeding, apply firm pressure for 20 minutes.  If bleeding does not subside, return to the emergency room. Follow-up with your primary doctor for wound recheck. Return to the emergency room with any new, worsening, concerning symptoms.

## 2019-07-22 ENCOUNTER — Ambulatory Visit (INDEPENDENT_AMBULATORY_CARE_PROVIDER_SITE_OTHER): Payer: Medicare HMO | Admitting: Internal Medicine

## 2019-07-22 ENCOUNTER — Encounter: Payer: Self-pay | Admitting: Internal Medicine

## 2019-07-22 ENCOUNTER — Other Ambulatory Visit: Payer: Self-pay

## 2019-07-22 VITALS — BP 122/78 | HR 65 | Temp 98.1°F | Ht 63.0 in

## 2019-07-22 DIAGNOSIS — M79672 Pain in left foot: Secondary | ICD-10-CM

## 2019-07-22 DIAGNOSIS — S91302A Unspecified open wound, left foot, initial encounter: Secondary | ICD-10-CM | POA: Diagnosis not present

## 2019-07-22 NOTE — Assessment & Plan Note (Signed)
Referral to home health for bandage changes as son recently had knee replacement and cannot care for her as well as he would like. Rebandaged today and put in walking shoe to help with stability. Advised to stop xarelto for 3-4 days starting tomorrow (she has already taken for today). No signs of infection and she will finish the keflex given by ER. Follow up PCP 1-2 weeks.

## 2019-07-22 NOTE — Patient Instructions (Signed)
We will have you change the dressings every other day unless bleeding through the bandage.   Do not take xarelto until Tuesday.   Call us on Monday and let us know how it is doing.

## 2019-07-22 NOTE — Progress Notes (Signed)
   Subjective:   Patient ID: Tiffany Charles, female    DOB: 05-05-1930, 86 y.o.   MRN: [id number]  HPI The patient is an 86 YO female coming in for lower leg/foot pain and wound. Seen in ER on 07/20/19 and they were unable to suture due to fragile skin. This wound was not amenable to sterile strips or dermabond. She was given keflex to prevent infection and advised to follow up. She is still bleeding a lot and they have changed bandages twice today due to saturation. She denies pain except with bandage changes. No fevers or chills. She has already taken her xarelto for today.   Review of Systems  Unable to perform ROS: Dementia  Respiratory: Negative for cough, chest tightness and shortness of breath.   Cardiovascular: Negative for chest pain, palpitations and leg swelling.  Gastrointestinal: Negative for abdominal distention, abdominal pain, constipation, diarrhea, nausea and vomiting.  Musculoskeletal: Negative.   Skin: Positive for color change and wound.    Objective:  Physical Exam Constitutional:      Appearance: She is well-developed.  HENT:     Head: Normocephalic and atraumatic.  Cardiovascular:     Rate and Rhythm: Normal rate and regular rhythm.  Pulmonary:     Effort: Pulmonary effort is normal. No respiratory distress.     Breath sounds: Normal breath sounds. No wheezing or rales.  Abdominal:     General: Bowel sounds are normal. There is no distension.     Palpations: Abdomen is soft.     Tenderness: There is no abdominal tenderness. There is no rebound.  Musculoskeletal:     Cervical back: Normal range of motion.  Skin:    General: Skin is warm and dry.     Comments: Wound on the top of the foot mid-foot about 6 cm irregular, with oozing after bandage removal, some hematoma under the skin proximal to this, without signs of retained bodies or infection. Small wound on the bottom of the foot which is also oozing blood during exam  Neurological:     Mental Status: She  is alert and oriented to person, place, and time.     Coordination: Coordination normal.     Vitals:   07/22/19 1158  BP: 122/78  Pulse: 65  Temp: 98.1 F (36.7 C)  TempSrc: Oral  SpO2: 97%  Height: 5\' 3"  (1.6 m)    This visit occurred during the SARS-CoV-2 public health emergency.  Safety protocols were in place, including screening questions prior to the visit, additional usage of staff PPE, and extensive cleaning of exam room while observing appropriate contact time as indicated for disinfecting solutions.   Assessment & Plan:

## 2019-07-26 ENCOUNTER — Emergency Department (HOSPITAL_COMMUNITY)
Admission: EM | Admit: 2019-07-26 | Discharge: 2019-07-28 | Disposition: A | Discharge status: Skilled Nursing Facility | Payer: Medicare HMO | Attending: Emergency Medicine | Admitting: Emergency Medicine

## 2019-07-26 ENCOUNTER — Other Ambulatory Visit: Payer: Self-pay

## 2019-07-26 DIAGNOSIS — Z7982 Long term (current) use of aspirin: Secondary | ICD-10-CM | POA: Diagnosis not present

## 2019-07-26 DIAGNOSIS — Y999 Unspecified external cause status: Secondary | ICD-10-CM | POA: Insufficient documentation

## 2019-07-26 DIAGNOSIS — J449 Chronic obstructive pulmonary disease, unspecified: Secondary | ICD-10-CM | POA: Insufficient documentation

## 2019-07-26 DIAGNOSIS — Z20822 Contact with and (suspected) exposure to covid-19: Secondary | ICD-10-CM | POA: Insufficient documentation

## 2019-07-26 DIAGNOSIS — Z85828 Personal history of other malignant neoplasm of skin: Secondary | ICD-10-CM | POA: Diagnosis not present

## 2019-07-26 DIAGNOSIS — Z8673 Personal history of transient ischemic attack (TIA), and cerebral infarction without residual deficits: Secondary | ICD-10-CM | POA: Insufficient documentation

## 2019-07-26 DIAGNOSIS — I110 Hypertensive heart disease with heart failure: Secondary | ICD-10-CM | POA: Diagnosis not present

## 2019-07-26 DIAGNOSIS — Y939 Activity, unspecified: Secondary | ICD-10-CM | POA: Insufficient documentation

## 2019-07-26 DIAGNOSIS — Y929 Unspecified place or not applicable: Secondary | ICD-10-CM | POA: Insufficient documentation

## 2019-07-26 DIAGNOSIS — Z87891 Personal history of nicotine dependence: Secondary | ICD-10-CM | POA: Diagnosis not present

## 2019-07-26 DIAGNOSIS — Z7901 Long term (current) use of anticoagulants: Secondary | ICD-10-CM | POA: Insufficient documentation

## 2019-07-26 DIAGNOSIS — Z955 Presence of coronary angioplasty implant and graft: Secondary | ICD-10-CM | POA: Insufficient documentation

## 2019-07-26 DIAGNOSIS — R609 Edema, unspecified: Secondary | ICD-10-CM | POA: Diagnosis not present

## 2019-07-26 DIAGNOSIS — Z66 Do not resuscitate: Secondary | ICD-10-CM | POA: Diagnosis not present

## 2019-07-26 DIAGNOSIS — W208XXA Other cause of strike by thrown, projected or falling object, initial encounter: Secondary | ICD-10-CM | POA: Diagnosis not present

## 2019-07-26 DIAGNOSIS — F0390 Unspecified dementia without behavioral disturbance: Secondary | ICD-10-CM | POA: Insufficient documentation

## 2019-07-26 DIAGNOSIS — I2510 Atherosclerotic heart disease of native coronary artery without angina pectoris: Secondary | ICD-10-CM | POA: Diagnosis not present

## 2019-07-26 DIAGNOSIS — S91302A Unspecified open wound, left foot, initial encounter: Secondary | ICD-10-CM | POA: Diagnosis not present

## 2019-07-26 DIAGNOSIS — I5032 Chronic diastolic (congestive) heart failure: Secondary | ICD-10-CM | POA: Diagnosis not present

## 2019-07-26 DIAGNOSIS — Z79899 Other long term (current) drug therapy: Secondary | ICD-10-CM | POA: Insufficient documentation

## 2019-07-26 DIAGNOSIS — Z03818 Encounter for observation for suspected exposure to other biological agents ruled out: Secondary | ICD-10-CM | POA: Diagnosis not present

## 2019-07-26 NOTE — ED Notes (Signed)
Patient's son left bedside to return home. Son was educated that patient would remain here until placement found. Son requesting [facility] facility. This nurse educated son that it may be more than a day to find placement. Son stated that if wound on patient foot heals prior to finding placement, then he would like for patient to come home instead of going to facility.

## 2019-07-26 NOTE — Evaluation (Signed)
Physical Therapy Evaluation Patient Details Name: Tiffany Charles MRN: [id number] DOB: 1930-01-24 Today's Date: 07/26/2019   History of Present Illness  86yo  female with a PMH of Xarelto, Afib, COPD presents to the ED 07/26/19 with c/o left foot wound x 1 week. According to son patient had a fall about 1 week ago when the walker fell on her right foot, came to ED at that time and had  wound care to L foot wound, xrays negative at that time on her visit to the ED on Feb 24th.  Clinical Impression  On eval in ED, pt was Min guard assist for mobility. Used RW for ambulation safety and to aid with WBing in case L foot was painful. Pt tolerated activity fairly well. Son was present during session. He stated that pt has a hx of falls. He stated he is currently unable to manage pt's care, especially with new wound on L foot. Son would like ST rehab placement for PT and continued wound care.     Follow Up Recommendations SNF    Equipment Recommendations  Rolling walker with 5" wheels(if pt doesn't have one)    Recommendations for Other Services       Precautions / Restrictions Precautions Precautions: Fall Required Braces or Orthoses: (post op shoe) Restrictions Other Position/Activity Restrictions: WBAT      Mobility  Bed Mobility Overal bed mobility: Needs Assistance Bed Mobility: Supine to Sit;Sit to Supine     Supine to sit: Supervision;HOB elevated Sit to supine: Supervision;HOB elevated   General bed mobility comments: for safety  Transfers Overall transfer level: Needs assistance Equipment used: Rolling walker (2 wheeled) Transfers: Sit to/from Stand Sit to Stand: Min guard         General transfer comment: for safety. Cues for hand placement, safety.  Ambulation/Gait Ambulation/Gait assistance: Min guard Gait Distance (Feet): 50 Feet Assistive device: Rolling walker (2 wheeled) Gait Pattern/deviations: Step-through pattern;Decreased stride length;Trunk flexed     General Gait Details: Cues for safety, proper use of RW. Slow gait speed. Close guard for safety.  Stairs            Wheelchair Mobility    Modified Rankin (Stroke Patients Only)       Balance Overall balance assessment: Needs assistance;History of Falls         Standing balance support: During functional activity Standing balance-Leahy Scale: Fair                               Pertinent Vitals/Pain Pain Assessment: Faces Faces Pain Scale: Hurts a little bit Pain Location: L foot Pain Descriptors / Indicators: Sore Pain Intervention(s): Monitored during session    Home Living Family/patient expects to be discharged to:: Private residence Living Arrangements: Children Available Help at Discharge: Family;Available 24 hours/day Type of Home: House Home Access: Stairs to enter   [employer] of Steps: 3 Home Layout: Two level;Able to live on main level with bedroom/bathroom Home Equipment: Shower seat;Walker - 2 wheels;Wheelchair - manual      Prior Function           Comments: Per son, walks without an assistive device     Hand Dominance        Extremity/Trunk Assessment   Upper Extremity Assessment Upper Extremity Assessment: Generalized weakness    Lower Extremity Assessment Lower Extremity Assessment: Generalized weakness    Cervical / Trunk Assessment Cervical / Trunk Assessment: Kyphotic  Communication  Communication: No difficulties  Cognition Arousal/Alertness: Awake/alert Behavior During Therapy: WFL for tasks assessed/performed Overall Cognitive Status: History of cognitive impairments - at baseline                                        General Comments      Exercises     Assessment/Plan    PT Assessment Patient needs continued PT services  PT Problem List Decreased strength;Decreased mobility;Decreased activity tolerance;Decreased balance;Decreased knowledge of use of DME;Decreased  cognition;Decreased safety awareness       PT Treatment Interventions DME instruction;Gait training;Therapeutic activities;Therapeutic exercise;Patient/family education;Balance training;Functional mobility training    PT Goals (Current goals can be found in the Care Plan section)  Acute Rehab PT Goals Patient Stated Goal: son wants rehab so pt can get better and for wound care PT Goal Formulation: With family Time For Goal Achievement: 08/09/19 Potential to Achieve Goals: Fair    Frequency Min 2X/week   Barriers to discharge        Co-evaluation               AM-PAC PT "6 Clicks" Mobility  Outcome Measure Help needed turning from your back to your side while in a flat bed without using bedrails?: A Little Help needed moving from lying on your back to sitting on the side of a flat bed without using bedrails?: A Little Help needed moving to and from a bed to a chair (including a wheelchair)?: A Little Help needed standing up from a chair using your arms (e.g., wheelchair or bedside chair)?: A Little Help needed to walk in hospital room?: A Little Help needed climbing 3-5 steps with a railing? : A Little 6 Click Score: 18    End of Session Equipment Utilized During Treatment: Gait belt Activity Tolerance: Patient tolerated treatment well Patient left: in bed;with call bell/phone within reach;with family/visitor present   PT Visit Diagnosis: Muscle weakness (generalized) (M62.81);Difficulty in walking, not elsewhere classified (R26.2)    Time: 1600-1610 PT Time Calculation (min) (ACUTE ONLY): 10 min   Charges:   PT Evaluation $PT Eval Low Complexity: 1 Low             Cameran Ahmed P, PT Acute Rehabilitation

## 2019-07-26 NOTE — ED Notes (Signed)
Wound care performed, explained to son the wound care and encouraged him to ask questions. Supplies given for wound care at home as well.

## 2019-07-26 NOTE — TOC Initial Note (Addendum)
Transition of Care [facility]) - Initial/Assessment Note    Patient Details  Name: Tiffany Charles MRN: [id number] Date of Birth: [birth year]  [facility]) CM/SW Contact:    Erenest Rasher, RN Phone Number: [phone] 07/26/2019, 3:23 PM  Clinical Narrative:                 TOC CM spoke to pt's son, Yari Szeliga. Pt lives in the home with son. States pt needs assistance and prefers she goes to SNF rehab. He is unsure how well she will do with mobility with injured foot. States he prefers [facility]. Agreed to create FL2 and fax referral to SNF. Waiting PT evaluation.   Expected Discharge Plan: Skilled Nursing Facility Barriers to Discharge: Continued Medical Work up   Patient Goals and CMS Choice Patient states their goals for this hospitalization and ongoing recovery are:: needs assistance with ambulating and dressing changes CMS Medicare.gov Compare Post Acute Care list provided to:: Patient Represenative (must comment)(son- Gabriela Eves) Choice offered to / list presented to : Adult Children  Expected Discharge Plan and Services Expected Discharge Plan: [place] In-house Referral: Clinical Social Work Discharge Planning Services: CM Consult Post Acute Care Choice: [place] arrangements for the past 2 months: [place]                                      Prior Living Arrangements/Services Living arrangements for the past 2 months: Single Family Home Lives with:: Adult Children Patient language and need for interpreter reviewed:: Yes        Need for Family Participation in Patient Care: Yes (Comment) Care giver support system in place?: Yes (comment) Current home services: DME(rolling walker) Criminal Activity/Legal Involvement Pertinent to Current Situation/Hospitalization: No - Comment as needed  Activities of Daily Living      Permission Sought/Granted   Permission granted to share information with  : Yes, Verbal Permission Granted  Share Information with NAME: Pearla Mckinny  Permission granted to share info w AGENCY: SNF  Permission granted to share info w Relationship: son  Permission granted to share info w Contact Information: [phone]  Emotional Assessment       Orientation: : Oriented to Self   Psych Involvement: No (comment)  Admission diagnosis:  Foot wound Patient Active Problem List   Diagnosis Date Noted  . Left foot pain 07/22/2019  . Open wound of left foot 07/22/2019  . DNR (do not resuscitate) 06/08/2019  . Occipital stroke ([place]) 06/07/2019  . Squamous cell carcinoma, leg, unspecified laterality 06/08/2018  . Basal cell carcinoma (BCC) of nasal tip 06/08/2018  . Senile dementia without behavioral disturbance ([place]) 04/29/2017  . Frequent PVCs 09/17/2016  . Tricuspid regurgitation 09/17/2016  . Mild pulmonary hypertension ([place]) 09/17/2016  . Chronic diastolic (congestive) heart failure ([place])   . Essential hypertension   . Generalized OA 12/28/2014  . Routine general medical examination at a health care facility 11/09/2013  . Depression, major ([place]) 11/07/2013  . Chronic anticoagulation- Xarelto 12/28/2012  . Permanent atrial fibrillation 10/28/2012  . COPD - PFTs in June 2014 with MET test 10/28/2012  . CAD, RCA BMS '09, 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 low risk Myoview Feb 2013 10/28/2012  . Hyperlipidemia 10/28/2012   PCP:  [name], MD Pharmacy:   [facility] [place] - [address] [place]) [place] [address] Phone: [phone] Fax: [phone]  [facility] [place], [place] [address] Phone: [phone] Fax: [phone]     Social Determinants of Health (SDOH) Interventions    Readmission Risk Interventions No flowsheet data found.

## 2019-07-26 NOTE — ED Provider Notes (Signed)
[facility] DEPT Provider Note   CSN: [id number] Arrival date & time: 07/26/19  1221     History Chief Complaint  Patient presents with  . Extremity Laceration    Tiffany Charles is a 86 y.o. female.  86 y.o female with a PMH of Xarelto, Afib, COPD presents to the ED with a chief complaint of left foot wound x 1 week. According to son at the bedside, patient had a fall about 1 week ago when the walker fell on her right foot. She had wound repair while in the ED, was placed on antibiotics to prevent any infection. She was evaluated by her PCP as wound continued to bleed as patient is currently on Xarelto for her A. fib. Son at the bedside reports wound continues to ooze and bleed, patient is very active at home, has refused to rest due to her dementia. He states primary care instructed him to bring patient into the ED for further placement. He reports no fevers, patient has not been on Xarelto for the past 4 days, no other falls or injuries.  The history is provided by the patient and medical records.       Past Medical History:  Diagnosis Date  . Angina pectoris   . Arthritis   . Chronic anticoagulation    Xarelto  . Chronic diastolic (congestive) heart failure ([place])   . COPD (chronic obstructive pulmonary disease) ([place])   . Coronary artery disease    a. s/p RCA stent 2008. b. cath 02/2009: 30% mLAD, 90% AV groove cx unchanged from prior, 50-60% RCA, EF 60% -> med rx.  . Depression   . Essential hypertension   . Hyperlipidemia   . Mild pulmonary hypertension ([place]) 09/17/2016  . Permanent atrial fibrillation ([place])   . Skin cancer Jan 2013   squamous cell- removed  . Tricuspid regurgitation 09/17/2016    Patient Active Problem List   Diagnosis Date Noted  . Left foot pain 07/22/2019  . Open wound of left foot 07/22/2019  . DNR (do not resuscitate) 06/08/2019  . Occipital stroke ([place]) 06/07/2019  . Squamous cell carcinoma, leg, unspecified  laterality 06/08/2018  . Basal cell carcinoma (BCC) of nasal tip 06/08/2018  . Senile dementia without behavioral disturbance ([place]) 04/29/2017  . Frequent PVCs 09/17/2016  . Tricuspid regurgitation 09/17/2016  . Mild pulmonary hypertension ([place]) 09/17/2016  . Chronic diastolic (congestive) heart failure ([place])   . Essential hypertension   . Generalized OA 12/28/2014  . Routine general medical examination at a health care facility 11/09/2013  . Depression, major ([place]) 11/07/2013  . Chronic anticoagulation- Xarelto 12/28/2012  . Permanent atrial fibrillation 10/28/2012  . COPD - PFTs in June 2014 with MET test 10/28/2012  . CAD, RCA BMS '09, 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 low risk Myoview Feb 2013 10/28/2012  . Hyperlipidemia 10/28/2012    Past Surgical History:  Procedure Laterality Date  .  CATARACTS REMOVED    . ABDOMINAL HYSTERECTOMY    . CARDIAC CATHETERIZATION  03/22/2009   Started medical therapy  . CARDIOVERSION  06/05/2009   3 attempts, last attempt successful  . CORONARY ANGIOPLASTY WITH STENT PLACEMENT  2009   RCA Vision stent  . HIP SURGERY    . MELANOMA EXCISION  06/06/2011   Procedure: MELANOMA EXCISION;  Surgeon: Adin Hector, MD;  Location: [facility] ORS;  Service: General;  Laterality: Left;  re excision squamous cell lesion left chest wall      OB History  No obstetric history on file.     Family History  Problem Relation Age of Onset  . Stroke Mother   . Heart disease Mother   . Coronary artery disease Father   . Heart disease Father   . Stroke Sister   . Heart disease Brother   . Stroke Sister   . Stroke Sister     Social History   Tobacco Use  . Smoking status: Former Smoker    Packs/day: 0.50  . Smokeless tobacco: Never Used  Substance Use Topics  . Alcohol use: No  . Drug use: No    Home Medications Prior to Admission medications   Medication Sig Start Date End Date Taking? Authorizing Provider  aspirin EC 81 MG tablet Take  81-324 mg by mouth See admin instructions. Take 162 mg by mouth in the morning and 324 mg as needed for chest pain    [provider]  atorvastatin (LIPITOR) 40 MG tablet Take 1 tablet (40 mg total) by mouth daily. 07/01/19   [name], MD  metoprolol succinate (TOPROL-XL) 25 MG 24 hr tablet Take 1 tablet (25 mg total) by mouth daily. 05/05/19   Croitoru, Mihai, MD  Rivaroxaban (XARELTO) 15 MG TABS tablet Take 1 tablet (15 mg total) by mouth daily before supper. Patient taking differently: Take 15 mg by mouth daily after breakfast.  05/05/19   Croitoru, Dani Gobble, MD    Allergies    Codeine  Review of Systems   Review of Systems  Constitutional: Negative for fever.  Skin: Positive for wound.    Physical Exam Updated Vital Signs BP 140/83 (BP Location: Right Arm)   Pulse 76   Temp 97.8 F (36.6 C) (Oral)   Resp 16   SpO2 100%   Physical Exam Vitals and nursing note reviewed.  Constitutional:      Appearance: Normal appearance.  HENT:     Head: Normocephalic and atraumatic.     Mouth/Throat:     Mouth: Mucous membranes are moist.  Eyes:     Pupils: Pupils are equal, round, and reactive to light.  Cardiovascular:     Rate and Rhythm: Normal rate.     Pulses:          Dorsalis pedis pulses are 2+ on the left side.       Posterior tibial pulses are 2+ on the left side.  Pulmonary:     Effort: Pulmonary effort is normal.  Abdominal:     General: Abdomen is flat.     Tenderness: There is no abdominal tenderness.  Musculoskeletal:     Cervical back: Normal range of motion and neck supple.  Feet:     Left foot:     Skin integrity: Erythema present.     Comments: Please see photos attached.  Skin:    General: Skin is warm and dry.     Findings: Bruising and erythema present.  Neurological:     Mental Status: She is alert.         ED Results / Procedures / Treatments   Labs (all labs ordered are listed, but only abnormal results are displayed) Labs  Reviewed - No data to display  EKG None  Radiology No results found.  Procedures Procedures (including critical care time)  Medications Ordered in ED Medications - No data to display  ED Course  I have reviewed the triage vital signs and the nursing notes.  Pertinent labs & imaging results that were available during my care  of the patient were reviewed by me and considered in my medical decision making (see chart for details).    MDM Rules/Calculators/A&P    Patient presents to the ED brought in by son with a past medical history of dementia with left foot bleeding. According to patient's chart which is extensively reviewed, she was originally seen on February 24 of 2021, was given a prescription for antibiotics to prevent any further infection. Her foot was wrapped and bandaged. An x-ray was obtained at original visit, this did not show any fracture or dislocation. She was placed on antibiotics as wound was not repaired. She was then seen by her PCP 2 days after Dr. Darrol Poke in office. Recommended discontinuing Xarelto for 4 days, she has not been taking this according to her son at the bedside.  He reports he is unable to take care of her at home as patient continues to ambulate on her left foot, causing this to bleed and he has to do multiple bandage changes. He was advised by primary care to be seen in the ED for further admission or placement into rehab. According to primary care's note, she was instructed to finish her antibiotics. They were going to have her follow-up in 1 to 2 weeks.  During my evaluation there is no drainage coming from the wound, mild oozing blood noted. Pulses are present, capillary refill is intact. Bruising noted to all her toes, mild discomfort with palpation. She is at her baseline per son, has not had any fevers, other injuries or trauma.  2:27 PM social work has been contacted, patient son is requesting patient to be placed in a SNF.  Will  obtain PT consult.  3:16 PM Spoke to [employer] case management, who reported patient's son is requesting placement at [facility]? prior rehab facility which has been patient. I have placed a PT eval for patient to determine SNF placement.   Care signed out to incoming team pending PT consultation.     Portions of this note were generated with Lobbyist. Dictation errors may occur despite best attempts at proofreading.  Final Clinical Impression(s) / ED Diagnoses Final diagnoses:  Wound of left foot    Rx / DC Orders ED Discharge Orders    None       Janeece Fitting, PA-C 07/26/19 1518    Fredia Sorrow, MD 07/27/19 [phone]

## 2019-07-26 NOTE — Progress Notes (Signed)
Assessment complete, FL-2 signed and sent out with referrals to SNF's in and outside of [facility] with verbal permission of pt's son, per the RN CM.  SNF's include [facility], [facility] and SNF's in [place].  COVID test initiated by EPD and PT recommendations sent with referrals.  CSW will continue to follow for D/C needs.  Alphonse Guild. Andjela Wickes, LCSW, LCAS, CSI Transitions of Care Clinical Social Worker Care Coordination Department Ph: [phone]

## 2019-07-26 NOTE — Discharge Instructions (Addendum)
You will be called by social work in order to help with home health aide.  Please follow up with your primary care physician this week in order to obtain reevaluation of wound.

## 2019-07-26 NOTE — NC FL2 (Signed)
[place] MEDICAID FL2 LEVEL OF CARE SCREENING TOOL     IDENTIFICATION  Patient Name: CLYDA SMYTH Birthdate: 08-20-1929 Sex: female Admission Date (Current Location): 07/26/2019  [facility] and [place] Number:  Herbalist and Address:  [facility],  [place] [address], [place]      Provider Number: [id number]  Attending Physician Name and Address:  Default, Provider, MD  Relative Name and Phone Number:       Current Level of Care: Hospital Recommended Level of Care: [place] Prior Approval Number:    Date Approved/Denied:  07/26/2019 PASRR Number: [id number]  Discharge Plan: SNF    Current Diagnoses: Patient Active Problem List   Diagnosis Date Noted  . Left foot pain 07/22/2019  . Open wound of left foot 07/22/2019  . DNR (do not resuscitate) 06/08/2019  . Occipital stroke ([place]) 06/07/2019  . Squamous cell carcinoma, leg, unspecified laterality 06/08/2018  . Basal cell carcinoma (BCC) of nasal tip 06/08/2018  . Senile dementia without behavioral disturbance ([place]) 04/29/2017  . Frequent PVCs 09/17/2016  . Tricuspid regurgitation 09/17/2016  . Mild pulmonary hypertension ([place]) 09/17/2016  . Chronic diastolic (congestive) heart failure ([place])   . Essential hypertension   . Generalized OA 12/28/2014  . Routine general medical examination at a health care facility 11/09/2013  . Depression, major ([place]) 11/07/2013  . Chronic anticoagulation- Xarelto 12/28/2012  . Permanent atrial fibrillation 10/28/2012  . COPD - PFTs in June 2014 with MET test 10/28/2012  . CAD, RCA BMS '09, 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 low risk Myoview Feb 2013 10/28/2012  . Hyperlipidemia 10/28/2012    Orientation RESPIRATION BLADDER Height & Weight     Self, Time, Situation, Place  Normal Continent Weight:   Height:     BEHAVIORAL SYMPTOMS/MOOD NEUROLOGICAL BOWEL NUTRITION STATUS      Continent Diet(Regular)  AMBULATORY STATUS  COMMUNICATION OF NEEDS Skin   Extensive Assist Verbally Normal(Laceration to left foot)                       Personal Care Assistance Level of Assistance  Bathing, Dressing Bathing Assistance: Limited assistance   Dressing Assistance: Limited assistance     Functional Limitations Info             SPECIAL CARE FACTORS FREQUENCY  PT (By licensed PT), OT (By licensed OT)     PT Frequency: 5 OT Frequency: 5            Contractures      Additional Factors Info  Code Status, Allergies Code Status Info: DNR Allergies Info: Codeine           Current Medications (07/26/2019):  This is the current hospital active medication list No current facility-administered medications for this encounter.   Current Outpatient Medications  Medication Sig Dispense Refill  . acitretin (SORIATANE) 25 MG capsule Take 25 mg by mouth daily.    Marland Kitchen aspirin EC 81 MG tablet Take 81 mg by mouth daily.     Marland Kitchen atorvastatin (LIPITOR) 40 MG tablet Take 1 tablet (40 mg total) by mouth daily. 90 tablet 1  . metoprolol succinate (TOPROL-XL) 25 MG 24 hr tablet Take 1 tablet (25 mg total) by mouth daily. 90 tablet 0  . Rivaroxaban (XARELTO) 15 MG TABS tablet Take 1 tablet (15 mg total) by mouth daily before supper. (Patient taking differently: Take 15 mg by mouth daily after breakfast. ) 90 tablet 0  Discharge Medications: Please see discharge summary for a list of discharge medications.  Relevant Imaging Results:  Relevant Lab Results:   Additional Information [id number]  Alphonse Guild Tucker Minter, LCSW

## 2019-07-26 NOTE — ED Provider Notes (Signed)
Medical screening examination/treatment/procedure(s) were conducted as a shared visit with non-physician practitioner(s) and myself.  I personally evaluated the patient during the encounter.      Patient seen by me along with physician assistant.  Patient had a wound to the top of her foot and was seen initially in the emergency department on February 24.'s on the top of the left foot.  Measures about 5 cm.  Kind of an avulsion type wound.  Had x-rays at that time without any bony abnormalities.  Wound looks reasonable today.  The wound could not be sutured closed.  But is healing by secondary intention.  Family member somewhat confused about how to take care of the wound plus she has dementia so at times she sort of takes the dressing off and and walks.  I told him that walking will not be a significant problem.  We went through wound care with dressing with antibiotic ointment 9 adherent dressing and then a wrap and then an Ace wrap.  And then I recommend close wound follow-up for another check later this week perhaps on Friday.   Fredia Sorrow, MD 07/26/19 [phone]

## 2019-07-26 NOTE — Progress Notes (Addendum)
CSW alerted by RN CM that pt's son is requesting that pt go to SNF.  CSW consulted EDP who states notes indicate pt would be unsafe to return home and pt should remain overnight for attempt at SNF placement and that if after further research if EDP thinks differently the EPD will updated the CSW.  PT recommendation is for SNF.  CSW will continue to follow for D/C needs.  Alphonse Guild. Camella Seim, LCSW, LCAS, CSI Transitions of Care Clinical Social Worker Care Coordination Department Ph: [phone]    '

## 2019-07-26 NOTE — ED Notes (Signed)
Patient ambulated to rest room and back, slightly unsteady gait with L boot on.

## 2019-07-26 NOTE — Progress Notes (Signed)
Per [facility] MUST pt now has a PASSR of:  [id number]  No End Date  CSW will continue to follow for D/C needs.  Alphonse Guild. Caillou Minus, LCSW, LCAS, CSI Transitions of Care Clinical Social Worker Care Coordination Department Ph: [phone]

## 2019-07-26 NOTE — ED Triage Notes (Addendum)
Arrives via EMS from home, lives with son. C/C wound to top of L foot, patient does not rest and the wound keeps reopening. Hx of dementia, ambulatory with walker. BP 150/100, P 92, O2 98% RA, T 98.6

## 2019-07-27 LAB — SARS CORONAVIRUS 2 (TAT 6-24 HRS): SARS Coronavirus 2: NEGATIVE

## 2019-07-27 MED ORDER — ATORVASTATIN CALCIUM 40 MG PO TABS
40.0000 mg | ORAL_TABLET | Freq: Every day | ORAL | Status: DC
  Administered 2019-07-27: 40 mg via ORAL
  Filled 2019-07-27: qty 1

## 2019-07-27 MED ORDER — METOPROLOL SUCCINATE ER 25 MG PO TB24
25.0000 mg | ORAL_TABLET | Freq: Every day | ORAL | Status: DC
  Administered 2019-07-27 – 2019-07-28 (×2): 25 mg via ORAL
  Filled 2019-07-27 (×3): qty 1

## 2019-07-27 MED ORDER — ACITRETIN 25 MG PO CAPS
25.0000 mg | ORAL_CAPSULE | Freq: Every day | ORAL | Status: DC
  Administered 2019-07-28: 25 mg via ORAL

## 2019-07-27 NOTE — Progress Notes (Addendum)
10:33a Narda Rutherford reports patient can be accepted today. CSW contacted patient's son to let him know he needs to go to the facility at 1:30p to complete paperwork. CSW contact [facility] and was informed that patient is NOT managed by them, facility getting auth with [facility].   9:41a CSW spoke with patient's son regarding current bed offers. Patient's son reports he would like patient to go to [facility]. CSW contacted Narda Rutherford in admissions who reports she will get back with CSW if they can accept patient today. CSW will continue to follow up.   [place]en Circle, LCSW Transitions of Care Department [facility] ED [phone]

## 2019-07-27 NOTE — Progress Notes (Signed)
CSW continues to seek SNF placement with patient's current bed offers. Patient was initially accepted to [facility], but there was a barrier on their end with getting insurance auth. CSW contacted patient's son who was agreeable with patient going to [facility]. CSW contacted Juliann Pulse and awaiting final disposition.   Golden Circle, LCSW Transitions of Care Department [facility] ED [phone]

## 2019-07-27 NOTE — ED Notes (Signed)
Patient's son is here visiting. He reports he was headed to SNF until they called him to not sign papers yet. He states the paperwork relating to the insurance is not correct.

## 2019-07-27 NOTE — Progress Notes (Addendum)
Pt has been accepted and offered a bed at [facility] for 07/28/19.  Room number is TBD as pt CANNOT D/C to [facility] SNF until admissions at [facility] SNF states pt can go on Thursday March 4th.  Of note:  DO NOT send pt until [facility] CSW/CM states pt can go on 07/28/19.  2nd shift ED CSW will leave handoff for 1st shift ED CSW.  CSW will continue to follow for D/C needs.  Alphonse Guild. Herbert Aguinaldo, LCSW, LCAS, CSI Transitions of Care Clinical Social Worker Care Coordination Department Ph: [phone]

## 2019-07-27 NOTE — ED Notes (Signed)
[place], patient's sone, he is going to bring the ACITRETIN medication from home to provide for patient after main pharmacy evaluates the medication.

## 2019-07-28 DIAGNOSIS — I5032 Chronic diastolic (congestive) heart failure: Secondary | ICD-10-CM | POA: Diagnosis not present

## 2019-07-28 DIAGNOSIS — L97819 Non-pressure chronic ulcer of other part of right lower leg with unspecified severity: Secondary | ICD-10-CM | POA: Diagnosis not present

## 2019-07-28 DIAGNOSIS — Z7901 Long term (current) use of anticoagulants: Secondary | ICD-10-CM | POA: Diagnosis not present

## 2019-07-28 DIAGNOSIS — R4182 Altered mental status, unspecified: Secondary | ICD-10-CM | POA: Diagnosis not present

## 2019-07-28 DIAGNOSIS — I110 Hypertensive heart disease with heart failure: Secondary | ICD-10-CM | POA: Diagnosis not present

## 2019-07-28 DIAGNOSIS — Z20822 Contact with and (suspected) exposure to covid-19: Secondary | ICD-10-CM | POA: Diagnosis not present

## 2019-07-28 DIAGNOSIS — Z79899 Other long term (current) drug therapy: Secondary | ICD-10-CM | POA: Diagnosis not present

## 2019-07-28 DIAGNOSIS — Z743 Need for continuous supervision: Secondary | ICD-10-CM | POA: Diagnosis not present

## 2019-07-28 DIAGNOSIS — I10 Essential (primary) hypertension: Secondary | ICD-10-CM | POA: Diagnosis not present

## 2019-07-28 DIAGNOSIS — I2510 Atherosclerotic heart disease of native coronary artery without angina pectoris: Secondary | ICD-10-CM | POA: Diagnosis not present

## 2019-07-28 DIAGNOSIS — I4821 Permanent atrial fibrillation: Secondary | ICD-10-CM | POA: Diagnosis not present

## 2019-07-28 DIAGNOSIS — S91302A Unspecified open wound, left foot, initial encounter: Secondary | ICD-10-CM | POA: Diagnosis not present

## 2019-07-28 DIAGNOSIS — E785 Hyperlipidemia, unspecified: Secondary | ICD-10-CM | POA: Diagnosis not present

## 2019-07-28 DIAGNOSIS — R52 Pain, unspecified: Secondary | ICD-10-CM | POA: Diagnosis not present

## 2019-07-28 DIAGNOSIS — Z66 Do not resuscitate: Secondary | ICD-10-CM | POA: Diagnosis not present

## 2019-07-28 DIAGNOSIS — R2689 Other abnormalities of gait and mobility: Secondary | ICD-10-CM | POA: Diagnosis not present

## 2019-07-28 DIAGNOSIS — S91302D Unspecified open wound, left foot, subsequent encounter: Secondary | ICD-10-CM | POA: Diagnosis not present

## 2019-07-28 DIAGNOSIS — F0390 Unspecified dementia without behavioral disturbance: Secondary | ICD-10-CM | POA: Diagnosis not present

## 2019-07-28 DIAGNOSIS — R279 Unspecified lack of coordination: Secondary | ICD-10-CM | POA: Diagnosis not present

## 2019-07-28 DIAGNOSIS — M6281 Muscle weakness (generalized): Secondary | ICD-10-CM | POA: Diagnosis not present

## 2019-07-28 DIAGNOSIS — M79672 Pain in left foot: Secondary | ICD-10-CM | POA: Diagnosis not present

## 2019-07-28 NOTE — Progress Notes (Addendum)
10:15a CSW notified patient's son that patient is to go to [facility] this morning.   9:18a Patient has been accepted to [facility]. Patient will be going to room 114P. Number for report is [phone]. Patient will be be transported via [facility].   Golden Circle, LCSW Transitions of Care Department [facility] ED [phone]

## 2019-08-02 ENCOUNTER — Other Ambulatory Visit: Payer: Self-pay

## 2019-08-02 ENCOUNTER — Ambulatory Visit (INDEPENDENT_AMBULATORY_CARE_PROVIDER_SITE_OTHER): Payer: Medicare HMO | Admitting: Internal Medicine

## 2019-08-02 ENCOUNTER — Encounter: Payer: Self-pay | Admitting: Internal Medicine

## 2019-08-02 VITALS — BP 124/78 | HR 80 | Temp 98.2°F | Ht 63.0 in | Wt 116.0 lb

## 2019-08-02 DIAGNOSIS — M79672 Pain in left foot: Secondary | ICD-10-CM | POA: Diagnosis not present

## 2019-08-02 DIAGNOSIS — S91302D Unspecified open wound, left foot, subsequent encounter: Secondary | ICD-10-CM

## 2019-08-02 NOTE — Patient Outreach (Signed)
[facility]) Care Management  08/02/2019  LATUSHA RIZKALLAH [birth year] [id number]     Transition of Care Referral  Referral Date: 08/02/2019 Referral Source: [facility] Discharge Report Date of Discharge: 07/30/2019 Facility: [facility] Insurance: [facility]   Referral received. Transition of care calls being completed via EMMI-automated calls. RN CM will outreach patient for any red flags received.    Plan: RN CM will close case at this time.   [name], RN,BSN,CCM [facility] Telephonic Care Management Coordinator Direct Phone: [phone] Toll Free: [phone] Fax: [phone]

## 2019-08-02 NOTE — Progress Notes (Signed)
   Subjective:   Patient ID: Tiffany Charles, female    DOB: Jan 24, 1930, 86 y.o.   MRN: [id number]  HPI The patient is an 86 YO female coming in with son for follow up ER (in for left foot wound s/p fall). Seen here on 07/22/19 and given course of antibiotics immediately after fall she is done with. Wound has stopped bleeding much. Son is concerned about appearance and the fact that his mom will not rest the foot and is walking around a lot on it. Patient with dementia and not able to add much to history. Using walker for ambulation at home some. No falls since injury.   Review of Systems  Unable to perform ROS: Dementia  Skin: Positive for wound.    Objective:  Physical Exam Constitutional:      Appearance: She is well-developed.  HENT:     Head: Normocephalic and atraumatic.  Cardiovascular:     Rate and Rhythm: Normal rate.  Pulmonary:     Effort: Pulmonary effort is normal. No respiratory distress.     Breath sounds: Normal breath sounds. No wheezing or rales.  Abdominal:     General: Bowel sounds are normal. There is no distension.     Palpations: Abdomen is soft.     Tenderness: There is no abdominal tenderness. There is no rebound.  Musculoskeletal:     Cervical back: Normal range of motion.  Skin:    General: Skin is warm and dry.     Comments: Wound left foot top, appears to be healing with granulation tissue, no oozing blood on exam, slight on dressing, some redness surrounding wound but no appearance of infection  Neurological:     Mental Status: She is alert and oriented to person, place, and time.     Coordination: Coordination abnormal.     Vitals:   08/02/19 1452  BP: 124/78  Pulse: 80  Temp: 98.2 F (36.8 C)  TempSrc: Oral  SpO2: 98%  Weight: 116 lb (52.6 kg)  Height: 5\' 3"  (1.6 m)    This visit occurred during the SARS-CoV-2 public health emergency.  Safety protocols were in place, including screening questions prior to the visit, additional usage of  staff PPE, and extensive cleaning of exam room while observing appropriate contact time as indicated for disinfecting solutions.   Assessment & Plan:

## 2019-08-02 NOTE — Patient Instructions (Signed)
We have wrapped the foot again and it looks like it is healing well.   You can keep using neosporin and bandaging daily.

## 2019-08-02 NOTE — Assessment & Plan Note (Signed)
Pain is mostly resolved, some after walking a long time. Can use tylenol for pain.

## 2019-08-02 NOTE — Assessment & Plan Note (Signed)
Appears to be healing with good granulation tissue. Advised neosporin to prevent sticking and daily to every other day dressing changes. Bleeding is stopped which is good. No antibiotics are indicated at this time.

## 2019-08-04 ENCOUNTER — Telehealth: Payer: Self-pay

## 2019-08-04 ENCOUNTER — Telehealth: Payer: Self-pay | Admitting: Adult Health

## 2019-08-04 ENCOUNTER — Inpatient Hospital Stay: Payer: Medicare HMO | Admitting: Adult Health

## 2019-08-04 ENCOUNTER — Telehealth: Payer: Self-pay | Admitting: Internal Medicine

## 2019-08-04 NOTE — Telephone Encounter (Signed)
Pt no-showed two new pt appointments on 07/12/2019 and 08/04/2019.

## 2019-08-04 NOTE — Telephone Encounter (Addendum)
    Patients son calling to report his mothers foot is red and swollen. They have a prescription for Keflex that was never filled. Son wants to know if he should go ahead and get the medication, or should they schedule another appointment. Advised son that prescription should be filled immediately  and followed as written Please call

## 2019-08-04 NOTE — Telephone Encounter (Signed)
Request discharge from clinic as patient had 2 new patient no-shows. Thank you.

## 2019-08-04 NOTE — Progress Notes (Deleted)
[facility] [address] [place]. [place] [address] [phone]       HOSPITAL FOLLOW UP NOTE  [name] Date of Birth:  1929-09-02 Medical Record Number:  [id number]   Reason for Referral:  hospital stroke follow up    CHIEF COMPLAINT:  No chief complaint on file.   HPI: Tiffany Charles being seen today for in office hospital follow-up regarding right PCA and MCA/PCA infarct secondary to known atrial fibrillation noncompliant with Xarelto on 06/06/2019.  History obtained from *** and chart review. Reviewed all radiology images and labs personally.  Ms. Tiffany Charles is a 86 y.o. female with history of of tricuspid regurgitation, permanent atrial fibrillation on Xarelto, hyperlipidemia, essential hypertension, CAD  who presented on 06/06/2019 with recurrent generalized weakness w/ falls 1/8, 1/10 and now 1/12.  Evaluated by stroke team and Dr. Erlinda Hong with stroke work-up revealing right PCA and MCA/PCA infarcts as evidenced on MRI secondary to known atrial fibrillation noncompliance with Xarelto.  CTA head/neck negative LVO but evidence of aortic arthrosclerosis, right ICA 30% stenosis, right VA with arthrosclerosis and narrowing and generalized arthrosclerosis.  2D echo unremarkable.  Advised to restart Xarelto for history of atrial fibrillation to ensure daily compliance as previously dosed several months back around 2 weeks prior to recent stroke. HTN stable. LDL 114 and switched pravastatin to atorvastatin 40 mg daily.  No history or evidence of DM with A1c 5.0.  Current tobacco use cessation counseling provided.  Patient is factors include advanced age, family history of stroke, CAD status post stent and chronic diastolic CHF but no prior history of stroke.  Other active problems include Alzheimer's dementia, COPD and tricuspid regurgitation.  Discharged home with son with recommendation of home health PT/OT.     ROS:   14 system review of systems performed and  negative with exception of ***  PMH:  Past Medical History:  Diagnosis Date  . Angina pectoris   . Arthritis   . Chronic anticoagulation    Xarelto  . Chronic diastolic (congestive) heart failure ([place])   . COPD (chronic obstructive pulmonary disease) ([place])   . Coronary artery disease    a. s/p RCA stent 2008. b. cath 02/2009: 30% mLAD, 90% AV groove cx unchanged from prior, 50-60% RCA, EF 60% -> med rx.  . Depression   . Essential hypertension   . Hyperlipidemia   . Mild pulmonary hypertension ([place]) 09/17/2016  . Permanent atrial fibrillation ([place])   . Skin cancer Jan 2013   squamous cell- removed  . Tricuspid regurgitation 09/17/2016    PSH:  Past Surgical History:  Procedure Laterality Date  .  CATARACTS REMOVED    . ABDOMINAL HYSTERECTOMY    . CARDIAC CATHETERIZATION  03/22/2009   Started medical therapy  . CARDIOVERSION  06/05/2009   3 attempts, last attempt successful  . CORONARY ANGIOPLASTY WITH STENT PLACEMENT  2009   RCA Vision stent  . HIP SURGERY    . MELANOMA EXCISION  06/06/2011   Procedure: MELANOMA EXCISION;  Surgeon: Adin Hector, MD;  Location: [facility] ORS;  Service: General;  Laterality: Left;  re excision squamous cell lesion left chest wall     Social History:  Social History   Socioeconomic History  . Marital status: Widowed    Spouse name: Not on file  . Number of children: 2  . Years of education: Not on file  . Highest education level: Not on file  Occupational History  . Occupation: Retired  Tobacco Use  . Smoking status: Former Smoker    Packs/day: 0.50  . Smokeless tobacco: Never Used  Substance and Sexual Activity  . Alcohol use: No  . Drug use: No  . Sexual activity: Never  Other Topics Concern  . Not on file  Social History Narrative   Pt lives alone, son is nearby.   Social Determinants of Health   Financial Resource Strain:   . Difficulty of Paying Living Expenses:   Food Insecurity:   . Worried About Charity fundraiser in  the Last Year:   . Arboriculturist in the Last Year:   Transportation Needs:   . Film/video editor (Medical):   Marland Kitchen Lack of Transportation (Non-Medical):   Physical Activity:   . Days of Exercise per Week:   . Minutes of Exercise per Session:   Stress:   . Feeling of Stress :   Social Connections:   . Frequency of Communication with Friends and Family:   . Frequency of Social Gatherings with Friends and Family:   . Attends Religious Services:   . Active Member of Clubs or Organizations:   . Attends Archivist Meetings:   Marland Kitchen Marital Status:   Intimate Partner Violence:   . Fear of Current or Ex-Partner:   . Emotionally Abused:   Marland Kitchen Physically Abused:   . Sexually Abused:     Family History:  Family History  Problem Relation Age of Onset  . Stroke Mother   . Heart disease Mother   . Coronary artery disease Father   . Heart disease Father   . Stroke Sister   . Heart disease Brother   . Stroke Sister   . Stroke Sister     Medications:   Current Outpatient Medications on File Prior to Visit  Medication Sig Dispense Refill  . acitretin (SORIATANE) 25 MG capsule Take 25 mg by mouth daily.    Marland Kitchen aspirin EC 81 MG tablet Take 81 mg by mouth daily.     Marland Kitchen atorvastatin (LIPITOR) 40 MG tablet Take 1 tablet (40 mg total) by mouth daily. 90 tablet 1  . metoprolol succinate (TOPROL-XL) 25 MG 24 hr tablet Take 1 tablet (25 mg total) by mouth daily. 90 tablet 0  . Rivaroxaban (XARELTO) 15 MG TABS tablet Take 1 tablet (15 mg total) by mouth daily before supper. (Patient taking differently: Take 15 mg by mouth daily after breakfast. ) 90 tablet 0   No current facility-administered medications on file prior to visit.    Allergies:   Allergies  Allergen Reactions  . Codeine Nausea And Vomiting     Physical Exam  There were no vitals filed for this visit. There is no height or weight on file to calculate BMI. No exam data present  Depression screen [facility] 2/9 06/30/2018   Decreased Interest 0  Down, Depressed, Hopeless 0  PHQ - 2 Score 0  Altered sleeping 0  Tired, decreased energy 1  Change in appetite 0  Feeling bad or failure about yourself  0  Trouble concentrating 3  Moving slowly or fidgety/restless 0  Suicidal thoughts 0  PHQ-9 Score 4  Difficult doing work/chores Not difficult at all     General: well developed, well nourished, seated, in no evident distress Head: head normocephalic and atraumatic.   Neck: supple with no carotid or supraclavicular bruits Cardiovascular: regular rate and rhythm, no murmurs Musculoskeletal: no deformity Skin:  no rash/petichiae Vascular:  Normal pulses all extremities  Neurologic Exam Mental Status: Awake and fully alert. Oriented to place and time. Recent and remote memory intact. Attention span, concentration and fund of knowledge appropriate. Mood and affect appropriate.  Cranial Nerves: Fundoscopic exam reveals sharp disc margins. Pupils equal, briskly reactive to light. Extraocular movements full without nystagmus. Visual fields full to confrontation. Hearing intact. Facial sensation intact. Face, tongue, palate moves normally and symmetrically.  Motor: Normal bulk and tone. Normal strength in all tested extremity muscles. Sensory.: intact to touch , pinprick , position and vibratory sensation.  Coordination: Rapid alternating movements normal in all extremities. Finger-to-nose and heel-to-shin performed accurately bilaterally. Gait and Station: Arises from chair without difficulty. Stance is normal. Gait demonstrates normal stride length and balance Reflexes: 1+ and symmetric. Toes downgoing.     NIHSS  *** Modified Rankin  *** CHA2DS2-VASc 8 HAS-BLED 2   Diagnostic Data (Labs, Imaging, Testing)   CT Head and Spine 1/8 no acute injury. Small vessel disease.   MRI 1/12 several small R occipital cortical infarcts. Small vessel disease. Atrophy.   CTA head & neck no LVO. Aortic atherosclerosis.  R ICA 30% stenosis. R VA w/ atherosclerosis and narrowing. Generalized atherosclerosis.   2D Echo EF 60-65%. No source of embolus   LDL 114  HgbA1c 5.0    ASSESSMENT: Tiffany Charles is a 86 y.o. year old female presented with generalized weakness with multiple falls on 06/06/2019 with stroke work-up revealing right PCA and MCA/PCA infarcts secondary to known atrial fibrillation and Xarelto noncompliance. Vascular risk factors include atrial fibrillation on Xarelto, HLD, HTN, CAD status post stent and CHF.     PLAN:  1. Right PCA and MCA/PCA stroke: Continue Xarelto (rivaroxaban) daily  and atorvastatin for secondary stroke prevention. Maintain strict control of hypertension with blood pressure goal below 130/90, diabetes with hemoglobin A1c goal below 6.5% and cholesterol with LDL cholesterol (bad cholesterol) goal below 70 mg/dL.  I also advised the patient to eat a healthy diet with plenty of whole grains, cereals, fruits and vegetables, exercise regularly with at least 30 minutes of continuous activity daily and maintain ideal body weight. 2. HTN: Advised to continue current treatment regimen.  Today's BP ***.  Advised to continue to monitor at home along with continued follow-up with PCP for management 3. HLD: Advised to continue current treatment regimen along with continued follow-up with PCP for future prescribing and monitoring of lipid panel 4. Atrial fibrillation: Ensure ongoing compliance with Xarelto and follow-up with cardiology for monitoring/management    Follow up in *** or call earlier if needed   Greater than 50% of time during this 45 minute visit was spent on counseling, explanation of diagnosis of ***, reviewing risk factor management of ***, planning of further management along with potential future management, and discussion with patient and family answering all questions.    Frann Rider, AGNP-BC  [facility] [address] [place]  [place], [place] [address]  Phone [phone] Fax [phone] Note: This document was prepared with digital dictation and possible smart phrase technology. Any transcriptional errors that result from this process are unintentional.

## 2019-08-04 NOTE — Telephone Encounter (Signed)
Tried to call but vm box is full.   Rx was printed from the hospital.

## 2019-08-04 NOTE — Telephone Encounter (Signed)
Patient was a no call/no show for their appointment today.   

## 2019-08-08 ENCOUNTER — Encounter: Payer: Self-pay | Admitting: Adult Health

## 2019-08-09 ENCOUNTER — Observation Stay: Payer: Medicare HMO

## 2019-08-09 ENCOUNTER — Observation Stay
Admission: EM | Admit: 2019-08-09 | Discharge: 2019-08-13 | Disposition: A | Discharge status: Other Acute Inpatient Hospital | Payer: Medicare HMO | Attending: Internal Medicine | Admitting: Internal Medicine

## 2019-08-09 ENCOUNTER — Other Ambulatory Visit: Payer: Self-pay

## 2019-08-09 ENCOUNTER — Emergency Department: Payer: Medicare HMO

## 2019-08-09 DIAGNOSIS — L03116 Cellulitis of left lower limb: Principal | ICD-10-CM

## 2019-08-09 DIAGNOSIS — Z03818 Encounter for observation for suspected exposure to other biological agents ruled out: Secondary | ICD-10-CM | POA: Diagnosis not present

## 2019-08-09 DIAGNOSIS — Z20822 Contact with and (suspected) exposure to covid-19: Secondary | ICD-10-CM | POA: Insufficient documentation

## 2019-08-09 DIAGNOSIS — Z7982 Long term (current) use of aspirin: Secondary | ICD-10-CM | POA: Diagnosis not present

## 2019-08-09 DIAGNOSIS — J449 Chronic obstructive pulmonary disease, unspecified: Secondary | ICD-10-CM | POA: Diagnosis not present

## 2019-08-09 DIAGNOSIS — L0390 Cellulitis, unspecified: Secondary | ICD-10-CM | POA: Diagnosis present

## 2019-08-09 DIAGNOSIS — I5032 Chronic diastolic (congestive) heart failure: Secondary | ICD-10-CM | POA: Diagnosis not present

## 2019-08-09 DIAGNOSIS — I4821 Permanent atrial fibrillation: Secondary | ICD-10-CM | POA: Diagnosis not present

## 2019-08-09 DIAGNOSIS — Z885 Allergy status to narcotic agent status: Secondary | ICD-10-CM | POA: Diagnosis not present

## 2019-08-09 DIAGNOSIS — F329 Major depressive disorder, single episode, unspecified: Secondary | ICD-10-CM | POA: Insufficient documentation

## 2019-08-09 DIAGNOSIS — M79672 Pain in left foot: Secondary | ICD-10-CM | POA: Diagnosis not present

## 2019-08-09 DIAGNOSIS — M7989 Other specified soft tissue disorders: Secondary | ICD-10-CM | POA: Diagnosis not present

## 2019-08-09 DIAGNOSIS — F0390 Unspecified dementia without behavioral disturbance: Secondary | ICD-10-CM | POA: Diagnosis not present

## 2019-08-09 DIAGNOSIS — Z7901 Long term (current) use of anticoagulants: Secondary | ICD-10-CM | POA: Diagnosis not present

## 2019-08-09 DIAGNOSIS — I2510 Atherosclerotic heart disease of native coronary artery without angina pectoris: Secondary | ICD-10-CM | POA: Diagnosis not present

## 2019-08-09 DIAGNOSIS — I110 Hypertensive heart disease with heart failure: Secondary | ICD-10-CM | POA: Insufficient documentation

## 2019-08-09 DIAGNOSIS — Z79899 Other long term (current) drug therapy: Secondary | ICD-10-CM | POA: Insufficient documentation

## 2019-08-09 DIAGNOSIS — E785 Hyperlipidemia, unspecified: Secondary | ICD-10-CM | POA: Insufficient documentation

## 2019-08-09 DIAGNOSIS — I70245 Atherosclerosis of native arteries of left leg with ulceration of other part of foot: Secondary | ICD-10-CM | POA: Diagnosis not present

## 2019-08-09 DIAGNOSIS — D649 Anemia, unspecified: Secondary | ICD-10-CM | POA: Insufficient documentation

## 2019-08-09 DIAGNOSIS — L97529 Non-pressure chronic ulcer of other part of left foot with unspecified severity: Secondary | ICD-10-CM | POA: Diagnosis not present

## 2019-08-09 LAB — COMPREHENSIVE METABOLIC PANEL
ALT: 18 U/L (ref 0–44)
AST: 35 U/L (ref 15–41)
Albumin: 4 g/dL (ref 3.5–5.0)
Alkaline Phosphatase: 70 U/L (ref 38–126)
Anion gap: 7 (ref 5–15)
BUN: 17 mg/dL (ref 8–23)
CO2: 29 mmol/L (ref 22–32)
Calcium: 9.3 mg/dL (ref 8.9–10.3)
Chloride: 103 mmol/L (ref 98–111)
Creatinine, Ser: 0.88 mg/dL (ref 0.44–1.00)
GFR calc Af Amer: >60 mL/min (ref >60)
GFR calc non Af Amer: 59 mL/min — ABNORMAL LOW (ref >60)
Glucose, Bld: 121 mg/dL — ABNORMAL HIGH (ref 70–99)
Potassium: 3.9 mmol/L (ref 3.5–5.1)
Sodium: 139 mmol/L (ref 135–145)
Total Bilirubin: 0.7 mg/dL (ref 0.3–1.2)
Total Protein: 7.3 g/dL (ref 6.5–8.1)

## 2019-08-09 LAB — CBC WITH DIFFERENTIAL/PLATELET
Abs Immature Granulocytes: 0.03 10*3/uL (ref 0.00–0.07)
Basophils Absolute: 0.1 10*3/uL (ref 0.0–0.1)
Basophils Relative: 1 %
Eosinophils Absolute: 0.2 10*3/uL (ref 0.0–0.5)
Eosinophils Relative: 3 %
HCT: 33.8 % — ABNORMAL LOW (ref 36.0–46.0)
Hemoglobin: 11.2 g/dL — ABNORMAL LOW (ref 12.0–15.0)
Immature Granulocytes: 0 %
Lymphocytes Relative: 26 %
Lymphs Abs: 2.3 10*3/uL (ref 0.7–4.0)
MCH: 32.6 pg (ref 26.0–34.0)
MCHC: 33.1 g/dL (ref 30.0–36.0)
MCV: 98.3 fL (ref 80.0–100.0)
Monocytes Absolute: 0.6 10*3/uL (ref 0.1–1.0)
Monocytes Relative: 7 %
Neutro Abs: 5.5 10*3/uL (ref 1.7–7.7)
Neutrophils Relative %: 63 %
Platelets: 300 10*3/uL (ref 150–400)
RBC: 3.44 MIL/uL — ABNORMAL LOW (ref 3.87–5.11)
RDW: 13.2 % (ref 11.5–15.5)
WBC: 8.8 10*3/uL (ref 4.0–10.5)
nRBC: 0 % (ref 0.0–0.2)

## 2019-08-09 LAB — LACTIC ACID, PLASMA: Lactic Acid, Venous: 1 mmol/L (ref 0.5–1.9)

## 2019-08-09 LAB — SEDIMENTATION RATE: Sed Rate: 40 mm/hr — ABNORMAL HIGH (ref 0–30)

## 2019-08-09 MED ORDER — ACETAMINOPHEN 650 MG RE SUPP: 650.0000 mg | Freq: Four times a day (QID) | RECTAL | Status: DC | PRN

## 2019-08-09 MED ORDER — SODIUM CHLORIDE 0.9% FLUSH: 3.0000 mL | Freq: Once | INTRAVENOUS | Status: DC

## 2019-08-09 MED ORDER — SODIUM CHLORIDE 0.9 % IV SOLN
1.0000 g | INTRAVENOUS | Status: DC
  Administered 2019-08-10 – 2019-08-12 (×3): 1 g via INTRAVENOUS
  Filled 2019-08-09: qty 10
  Filled 2019-08-09: qty 1
  Filled 2019-08-09: qty 10
  Filled 2019-08-09: qty 1

## 2019-08-09 MED ORDER — VITAMIN B-12 1000 MCG PO TABS
1000.0000 ug | ORAL_TABLET | Freq: Every day | ORAL | Status: DC
  Administered 2019-08-09 – 2019-08-13 (×4): 1000 ug via ORAL
  Filled 2019-08-09 (×4): qty 1

## 2019-08-09 MED ORDER — CEFAZOLIN SODIUM-DEXTROSE 1-4 GM/50ML-% IV SOLN
1.0000 g | Freq: Three times a day (TID) | INTRAVENOUS | Status: DC
  Filled 2019-08-09 (×3): qty 50

## 2019-08-09 MED ORDER — ACITRETIN 25 MG PO CAPS: 25.0000 mg | ORAL_CAPSULE | Freq: Every day | ORAL | Status: DC

## 2019-08-09 MED ORDER — RIVAROXABAN 15 MG PO TABS
15.0000 mg | ORAL_TABLET | Freq: Every day | ORAL | Status: DC
  Administered 2019-08-11 – 2019-08-12 (×2): 15 mg via ORAL
  Filled 2019-08-09 (×4): qty 1

## 2019-08-09 MED ORDER — GADOBUTROL 1 MMOL/ML IV SOLN
5.0000 mL | Freq: Once | INTRAVENOUS | Status: AC | PRN
  Administered 2019-08-09: 23:00:00 5 mL via INTRAVENOUS

## 2019-08-09 MED ORDER — METOPROLOL SUCCINATE ER 25 MG PO TB24
25.0000 mg | ORAL_TABLET | Freq: Every day | ORAL | Status: DC
  Administered 2019-08-10 – 2019-08-13 (×4): 25 mg via ORAL
  Filled 2019-08-09 (×4): qty 1

## 2019-08-09 MED ORDER — ASCORBIC ACID 500 MG PO TABS
500.0000 mg | ORAL_TABLET | Freq: Every day | ORAL | Status: DC
  Administered 2019-08-09 – 2019-08-13 (×4): 500 mg via ORAL
  Filled 2019-08-09 (×5): qty 1

## 2019-08-09 MED ORDER — SODIUM CHLORIDE 0.9 % IV SOLN
1.0000 g | Freq: Once | INTRAVENOUS | Status: AC
  Administered 2019-08-09: 1 g via INTRAVENOUS
  Filled 2019-08-09: qty 10

## 2019-08-09 MED ORDER — ATORVASTATIN CALCIUM 20 MG PO TABS
40.0000 mg | ORAL_TABLET | Freq: Every day | ORAL | Status: DC
  Administered 2019-08-11 – 2019-08-13 (×3): 40 mg via ORAL
  Filled 2019-08-09 (×3): qty 2

## 2019-08-09 MED ORDER — ACETAMINOPHEN 325 MG PO TABS
650.0000 mg | ORAL_TABLET | Freq: Four times a day (QID) | ORAL | Status: DC | PRN
  Administered 2019-08-09 – 2019-08-11 (×2): 650 mg via ORAL
  Filled 2019-08-09 (×2): qty 2

## 2019-08-09 MED ORDER — ASPIRIN EC 81 MG PO TBEC
81.0000 mg | DELAYED_RELEASE_TABLET | Freq: Every day | ORAL | Status: DC
  Administered 2019-08-11 – 2019-08-13 (×3): 81 mg via ORAL
  Filled 2019-08-09 (×3): qty 1

## 2019-08-09 MED ORDER — DOXYCYCLINE HYCLATE 100 MG PO TABS
100.0000 mg | ORAL_TABLET | Freq: Two times a day (BID) | ORAL | Status: DC
  Administered 2019-08-09 – 2019-08-11 (×3): 100 mg via ORAL
  Filled 2019-08-09 (×3): qty 1

## 2019-08-09 NOTE — ED Notes (Signed)
This RN called pt's son DON and notified him of her admission to 1C room 123. Per pt's son please only call him for any updates/qusetions or concerns. [name] is this pt's primary caregiver.   Don Crom's number has been updated in the system

## 2019-08-09 NOTE — ED Notes (Signed)
Awaiting for pharmacy to verify medication.

## 2019-08-09 NOTE — ED Provider Notes (Signed)
[facility] Emergency Department Provider Note  ____________________________________________   First MD Initiated Contact with Patient 08/09/19 1657     (approximate)  I have reviewed the triage vital signs and the nursing notes.   HISTORY  Chief Complaint Wound Infection    HPI Tiffany Charles is a 86 y.o. female below list of previous medical condition including left foot wound which patient sustained on July 20, 2019 presents to the emergency department secondary to worsening redness swelling and discomfort to the left foot in the area of the previously mentioned wound.  When asked how long the beforementioned symptoms have been progressing the patient states "months".  Patient denies any fever.        Past Medical History:  Diagnosis Date  . Angina pectoris   . Arthritis   . Chronic anticoagulation    Xarelto  . Chronic diastolic (congestive) heart failure ([place])   . COPD (chronic obstructive pulmonary disease) ([place])   . Coronary artery disease    a. s/p RCA stent 2008. b. cath 02/2009: 30% mLAD, 90% AV groove cx unchanged from prior, 50-60% RCA, EF 60% -> med rx.  . Depression   . Essential hypertension   . Hyperlipidemia   . Mild pulmonary hypertension ([place]) 09/17/2016  . Permanent atrial fibrillation ([place])   . Skin cancer Jan 2013   squamous cell- removed  . Tricuspid regurgitation 09/17/2016    Patient Active Problem List   Diagnosis Date Noted  . Left foot pain 07/22/2019  . Open wound of left foot 07/22/2019  . DNR (do not resuscitate) 06/08/2019  . Occipital stroke ([place]) 06/07/2019  . Squamous cell carcinoma, leg, unspecified laterality 06/08/2018  . Basal cell carcinoma (BCC) of nasal tip 06/08/2018  . Senile dementia without behavioral disturbance ([place]) 04/29/2017  . Frequent PVCs 09/17/2016  . Tricuspid regurgitation 09/17/2016  . Mild pulmonary hypertension ([place]) 09/17/2016  . Chronic diastolic (congestive) heart failure  ([place])   . Essential hypertension   . Generalized OA 12/28/2014  . Routine general medical examination at a health care facility 11/09/2013  . Depression, major ([place]) 11/07/2013  . Chronic anticoagulation- Xarelto 12/28/2012  . Permanent atrial fibrillation 10/28/2012  . COPD - PFTs in June 2014 with MET test 10/28/2012  . CAD, RCA BMS '09, 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 low risk Myoview Feb 2013 10/28/2012  . Hyperlipidemia 10/28/2012    Past Surgical History:  Procedure Laterality Date  .  CATARACTS REMOVED    . ABDOMINAL HYSTERECTOMY    . CARDIAC CATHETERIZATION  03/22/2009   Started medical therapy  . CARDIOVERSION  06/05/2009   3 attempts, last attempt successful  . CORONARY ANGIOPLASTY WITH STENT PLACEMENT  2009   RCA Vision stent  . HIP SURGERY    . MELANOMA EXCISION  06/06/2011   Procedure: MELANOMA EXCISION;  Surgeon: Adin Hector, MD;  Location: [facility] ORS;  Service: General;  Laterality: Left;  re excision squamous cell lesion left chest wall     Prior to Admission medications   Medication Sig Start Date End Date Taking? Authorizing Provider  acitretin (SORIATANE) 25 MG capsule Take 25 mg by mouth daily. 06/30/19  Yes [provider]  ascorbic acid (VITAMIN C) 500 MG tablet Take 500 mg by mouth daily.   Yes [provider]  aspirin EC 81 MG tablet Take 81 mg by mouth daily.    Yes [provider]  atorvastatin (LIPITOR) 40 MG tablet Take 1 tablet (40 mg  total) by mouth daily. 07/01/19  Yes [name], MD  cephALEXin (KEFLEX) 250 MG capsule Take 250 mg by mouth 3 (three) times daily.   Yes [provider]  cholecalciferol (VITAMIN D3) 25 MCG (1000 UNIT) tablet Take 1,000 Units by mouth daily.   Yes [provider]  metoprolol succinate (TOPROL-XL) 25 MG 24 hr tablet Take 1 tablet (25 mg total) by mouth daily. 05/05/19  Yes Croitoru, Mihai, MD  Rivaroxaban (XARELTO) 15 MG TABS tablet Take 1 tablet (15 mg total) by  mouth daily before supper. Patient taking differently: Take 15 mg by mouth daily after breakfast.  05/05/19  Yes Croitoru, Mihai, MD  vitamin B-12 (CYANOCOBALAMIN) 1000 MCG tablet Take 1,000 mcg by mouth daily.   Yes [provider]    Allergies Codeine  Family History  Problem Relation Age of Onset  . Stroke Mother   . Heart disease Mother   . Coronary artery disease Father   . Heart disease Father   . Stroke Sister   . Heart disease Brother   . Stroke Sister   . Stroke Sister     Social History Social History   Tobacco Use  . Smoking status: Former Smoker    Packs/day: 0.50  . Smokeless tobacco: Never Used  Substance Use Topics  . Alcohol use: No  . Drug use: No    Review of Systems Constitutional: No fever/chills Eyes: No visual changes. ENT: No sore throat. Cardiovascular: Denies chest pain. Respiratory: Denies shortness of breath. Gastrointestinal: No abdominal pain.  No nausea, no vomiting.  No diarrhea.  No constipation. Genitourinary: Negative for dysuria. Musculoskeletal: Negative for neck pain.  Negative for back pain. Integumentary: Negative for rash. Neurological: Negative for headaches, focal weakness or numbness.   ____________________________________________   PHYSICAL EXAM:  VITAL SIGNS: ED Triage Vitals  Enc Vitals Group     BP 08/09/19 1459 (!) 154/81     Pulse Rate 08/09/19 1459 88     Resp 08/09/19 1459 17     Temp 08/09/19 1459 98.2 F (36.8 C)     Temp Source 08/09/19 1459 Oral     SpO2 08/09/19 1459 98 %     Weight 08/09/19 1501 54.4 kg (120 lb)     Height 08/09/19 1501 1.6 m (_0 )     Head Circumference --      Peak Flow --      Pain Score 08/09/19 1500 3     Pain Loc --      Pain Edu? --      Excl. in [place]? --     Constitutional: Alert and oriented.  Eyes: Conjunctivae are normal.  Mouth/Throat: Patient is wearing a mask. Neck: No stridor.  No meningeal signs.   Cardiovascular: Normal rate, regular rhythm.  Good peripheral circulation. Grossly normal heart sounds. Respiratory: Normal respiratory effort.  No retractions. Gastrointestinal: Soft and nontender. No distention.  Musculoskeletal: No lower extremity tenderness nor edema. No gross deformities of extremities. Neurologic:  Normal speech and language. No gross focal neurologic deficits are appreciated.  Skin: Refer to below picture  Psychiatric: Mood and affect are normal. Speech and behavior are normal.  ____________________________________________   LABS (all labs ordered are listed, but only abnormal results are displayed)  Labs Reviewed  COMPREHENSIVE METABOLIC PANEL - Abnormal; Notable for the following components:      Result Value   Glucose, Bld 121 (*)    GFR calc non Af Amer 59 (*)    All  other components within normal limits  CBC WITH DIFFERENTIAL/PLATELET - Abnormal; Notable for the following components:   RBC 3.44 (*)    Hemoglobin 11.2 (*)    HCT 33.8 (*)    All other components within normal limits  CULTURE, BLOOD (ROUTINE X 2)  CULTURE, BLOOD (ROUTINE X 2)  LACTIC ACID, PLASMA  LACTIC ACID, PLASMA  SEDIMENTATION RATE  C-REACTIVE PROTEIN   ___________________________________  RADIOLOGY I, [place] Dan Dissinger, personally viewed and evaluated these images (plain radiographs) as part of my medical decision making, as well as reviewing the written report by the radiologist.  ED MD interpretation: No acute osseous abnormality noted on left foot x-ray soft tissue swelling noted on the dorsal aspect of the foot per radiologist.  Official radiology report(s): DG Foot 2 Views Left  Result Date: 08/09/2019 CLINICAL DATA:  Pain EXAM: LEFT FOOT - 2 VIEW COMPARISON:  07/20/2019 FINDINGS: There is soft tissue swelling about the dorsal aspect of the foot. There is no acute displaced fracture no dislocation. No radiographic evidence for osteomyelitis. IMPRESSION: No acute osseous abnormality. Soft tissue swelling about the  dorsal aspect of the foot. Electronically Signed   By: Constance Holster M.D.   On: 08/09/2019 15:55      Procedures   ____________________________________________   INITIAL IMPRESSION / MDM / ASSESSMENT AND PLAN / ED COURSE  As part of my medical decision making, I reviewed the following data within the [place] NUMBER  86 year old female presented with above-stated history and physical exam consistent with left foot cellulitis with necrotic tissue noted in the central area of a previous noted wound.  Patient given IV ceftriaxone in the emergency department.  Patient discussed with Dr. Florene Glen for hospital admission further evaluation and management.          ____________________________________________  FINAL CLINICAL IMPRESSION(S) / ED DIAGNOSES  Final diagnoses:  Cellulitis of left foot     MEDICATIONS GIVEN DURING THIS VISIT:  Medications  sodium chloride flush (NS) 0.9 % injection 3 mL (3 mLs Intravenous Not Given 08/09/19 1744)  cefTRIAXone (ROCEPHIN) 1 g in sodium chloride 0.9 % 100 mL IVPB (has no administration in time range)     ED Discharge Orders    None      *Please note:  Tiffany Charles was evaluated in Emergency Department on 08/09/2019 for the symptoms described in the history of present illness. She was evaluated in the context of the global COVID-19 pandemic, which necessitated consideration that the patient might be at risk for infection with the SARS-CoV-2 virus that causes COVID-19. Institutional protocols and algorithms that pertain to the evaluation of patients at risk for COVID-19 are in a state of rapid change based on information released by regulatory bodies including the CDC and federal and state organizations. These policies and algorithms were followed during the patient's care in the ED.  Some ED evaluations and interventions may be delayed as a result of limited staffing during the pandemic.*  Note:  This document was prepared  using Dragon voice recognition software and may include unintentional dictation errors.   Gregor Hams, MD 08/09/19 [phone]

## 2019-08-09 NOTE — H&P (Addendum)
History and Physical    Tiffany Charles [id number] DOB: 1929-10-20 DOA: 08/09/2019  PCP: [name], MD  Patient coming from: home  I have personally briefly reviewed patient's old medical records in [place]  Chief Complaint: left foot wound and cellulitis  HPI: Tiffany Charles is Tiffany Charles 86 y.o. female with medical history significant of afib on xarelto, HFpEF, COPD, CAD, HTN and multiple other medical problems presenting with Tiffany Charles left foot wound.  Patient fell and had injury to left foot in late February.  Since then, her son has been trying to care for it, but it's gotten progressively worse.  It was seen by family friends who are doctors who recommended being seen in the hospital.  The patient denies pain, fevers, chills.  She can't tell me clearly why she's here.  The history was obtained from chart review and the son.  ED Course: Labs, imaging, abx.  Admit to hospitalist.  Review of Systems: As per HPI otherwise 10 point review of systems negative.   Past Medical History:  Diagnosis Date  . Angina pectoris   . Arthritis   . Chronic anticoagulation    Xarelto  . Chronic diastolic (congestive) heart failure ([place])   . COPD (chronic obstructive pulmonary disease) ([place])   . Coronary artery disease    Tiffany Charles. s/p RCA stent 2008. b. cath 02/2009: 30% mLAD, 90% AV groove cx unchanged from prior, 50-60% RCA, EF 60% -> med rx.  . Depression   . Essential hypertension   . Hyperlipidemia   . Mild pulmonary hypertension ([place]) 09/17/2016  . Permanent atrial fibrillation ([place])   . Skin cancer Jan 2013   squamous cell- removed  . Tricuspid regurgitation 09/17/2016    Past Surgical History:  Procedure Laterality Date  .  CATARACTS REMOVED    . ABDOMINAL HYSTERECTOMY    . CARDIAC CATHETERIZATION  03/22/2009   Started medical therapy  . CARDIOVERSION  06/05/2009   3 attempts, last attempt successful  . CORONARY ANGIOPLASTY WITH STENT PLACEMENT  2009   RCA Vision stent  . HIP  SURGERY    . MELANOMA EXCISION  06/06/2011   Procedure: MELANOMA EXCISION;  Surgeon: Tiffany Hector, MD;  Location: [facility];  Service: General;  Laterality: Left;  re excision squamous cell lesion left chest wall      reports that she has quit smoking. She smoked 0.50 packs per day. She has never used smokeless tobacco. She reports that she does not drink alcohol or use drugs.  Allergies  Allergen Reactions  . Codeine Nausea And Vomiting    Family History  Problem Relation Age of Onset  . Stroke Mother   . Heart disease Mother   . Coronary artery disease Father   . Heart disease Father   . Stroke Sister   . Heart disease Brother   . Stroke Sister   . Stroke Sister    Prior to Admission medications   Medication Sig Start Date End Date Taking? Authorizing Provider  acitretin (SORIATANE) 25 MG capsule Take 25 mg by mouth daily. 06/30/19  Yes [provider]  ascorbic acid (VITAMIN C) 500 MG tablet Take 500 mg by mouth daily.   Yes [provider]  aspirin EC 81 MG tablet Take 81 mg by mouth daily.    Yes [provider]  atorvastatin (LIPITOR) 40 MG tablet Take 1 tablet (40 mg total) by mouth daily. 07/01/19  Yes [name], MD  cephALEXin (KEFLEX) 250 MG  capsule Take 250 mg by mouth 3 (three) times daily.   Yes [provider]  cholecalciferol (VITAMIN D3) 25 MCG (1000 UNIT) tablet Take 1,000 Units by mouth daily.   Yes [provider]  metoprolol succinate (TOPROL-XL) 25 MG 24 hr tablet Take 1 tablet (25 mg total) by mouth daily. 05/05/19  Yes Tiffany, Mihai, MD  Rivaroxaban (XARELTO) 15 MG TABS tablet Take 1 tablet (15 mg total) by mouth daily before supper. Patient taking differently: Take 15 mg by mouth daily after breakfast.  05/05/19  Yes Tiffany, Mihai, MD  vitamin B-12 (CYANOCOBALAMIN) 1000 MCG tablet Take 1,000 mcg by mouth daily.   Yes [provider]    Physical Exam: Vitals:   08/09/19 1459 08/09/19 1501  08/09/19 2020 08/09/19 2109  BP: (!) 154/81  (!) 157/64 (!) 161/99  Pulse: 88  92 79  Resp: 17  17   Temp: 98.2 F (36.8 C)  97.7 F (36.5 C) 97.6 F (36.4 C)  TempSrc: Oral  Oral Oral  SpO2: 98%  99% 100%  Weight:  54.4 kg    Height:  5\' 3"  (1.6 m)      Constitutional: NAD, calm, comfortable Vitals:   08/09/19 1459 08/09/19 1501 08/09/19 2020 08/09/19 2109  BP: (!) 154/81  (!) 157/64 (!) 161/99  Pulse: 88  92 79  Resp: 17  17   Temp: 98.2 F (36.8 C)  97.7 F (36.5 C) 97.6 F (36.4 C)  TempSrc: Oral  Oral Oral  SpO2: 98%  99% 100%  Weight:  54.4 kg    Height:  5\' 3"  (1.6 m)     Eyes: PERRL, lids and conjunctivae normal ENMT: Mucous membranes are moist. Posterior pharynx clear of any exudate or lesions.Normal dentition.  Neck: normal, supple, no masses, no thyromegaly Respiratory: clear to auscultation bilaterally, no wheezing, no crackles. Normal respiratory effort. No accessory muscle use.  Cardiovascular: Regular rate and rhythm, no murmurs / rubs / gallops. No extremity edema. 2+ pedal pulses. No carotid bruits.  Abdomen: no tenderness, no masses palpated. No hepatosplenomegaly. Bowel sounds positive.  Musculoskeletal: no clubbing / cyanosis. No joint deformity upper and lower extremities. Good ROM, no contractures. Normal muscle tone.  Skin: LLE with cellulitis and wound with central eschar Neurologic: CN 2-12 grossly intact. Sensation intact. Psychiatric: Normal judgment and insight. Alert and confused. Normal mood.   Labs on Admission: I have personally reviewed following labs and imaging studies  CBC: Recent Labs  Lab 08/09/19 1507  WBC 8.8  NEUTROABS 5.5  HGB 11.2*  HCT 33.8*  MCV 98.3  PLT [id number]   Basic Metabolic Panel: Recent Labs  Lab 08/09/19 1507  NA 139  K 3.9  CL 103  CO2 29  GLUCOSE 121*  BUN 17  CREATININE 0.88  CALCIUM 9.3   GFR: Estimated Creatinine Clearance: 37.3 mL/min (by C-G formula based on SCr of 0.88 mg/dL). Liver Function  Tests: Recent Labs  Lab 08/09/19 1507  AST 35  ALT 18  ALKPHOS 70  BILITOT 0.7  PROT 7.3  ALBUMIN 4.0   No results for input(s): LIPASE, AMYLASE in the last 168 hours. No results for input(s): AMMONIA in the last 168 hours. Coagulation Profile: No results for input(s): INR, PROTIME in the last 168 hours. Cardiac Enzymes: No results for input(s): CKTOTAL, CKMB, CKMBINDEX, TROPONINI in the last 168 hours. BNP (last 3 results) No results for input(s): PROBNP in the last 8760 hours. HbA1C: No results for input(s): HGBA1C in the last 72  hours. CBG: No results for input(s): GLUCAP in the last 168 hours. Lipid Profile: No results for input(s): CHOL, HDL, LDLCALC, TRIG, CHOLHDL, LDLDIRECT in the last 72 hours. Thyroid Function Tests: No results for input(s): TSH, T4TOTAL, FREET4, T3FREE, THYROIDAB in the last 72 hours. Anemia Panel: No results for input(s): VITAMINB12, FOLATE, FERRITIN, TIBC, IRON, RETICCTPCT in the last 72 hours. Urine analysis:    Component Value Date/Time   COLORURINE YELLOW 06/03/2019 2234   APPEARANCEUR CLEAR 06/03/2019 2234   LABSPEC 1.006 06/03/2019 2234   PHURINE 8.0 06/03/2019 [place] 06/03/2019 2234   HGBUR NEGATIVE 06/03/2019 2234   BILIRUBINUR NEGATIVE 06/03/2019 2234   KETONESUR NEGATIVE 06/03/2019 2234   PROTEINUR NEGATIVE 06/03/2019 2234   UROBILINOGEN 0.2 12/28/2012 2123   NITRITE NEGATIVE 06/03/2019 2234   LEUKOCYTESUR SMALL ([place]) 06/03/2019 2234    Radiological Exams on Admission: DG Foot 2 Views Left  Result Date: 08/09/2019 CLINICAL DATA:  Pain EXAM: LEFT FOOT - 2 VIEW COMPARISON:  07/20/2019 FINDINGS: There is soft tissue swelling about the dorsal aspect of the foot. There is no acute displaced fracture no dislocation. No radiographic evidence for osteomyelitis. IMPRESSION: No acute osseous abnormality. Soft tissue swelling about the dorsal aspect of the foot. Electronically Signed   By: Constance Holster M.D.   On:  08/09/2019 15:55    EKG: Independently reviewed. none  Assessment/Plan Active Problems:   Cellulitis  Cellulitis  Left Lower Extremity Wound: infected LLE wound after fall in late Feb (see 2/24 ED note and images).  Wound now with central eschar with surrounding cellulitis. Plain films with soft tissue swelling Plan for MRI L foot ABI, has palpable DP pulse Ceftriaxone, doxycycline Podiatry and vascular c/s, appreciate assistance  Atrial Fibrillation: on xarelto, continue beta blocker  CAD: continue aspirin, statin, beta blocker  Hypertension: metoprolol  Hx COPD: no resp distress or wheezing  Hx HFpEF: appears euvolemic  DVT prophylaxis: xarelto  Code Status: full  Family Communication: son  Disposition Plan: pending eval by pdoiatry and vascular Consults called: none  Admission status: obs    Fayrene Helper MD [facility] Pager AMION  If 7PM-7AM, please contact night-coverage [email or web address] Use universal [place] password for that web site. If you do not have the password, please call the hospital operator.  08/09/2019, 9:32 PM

## 2019-08-09 NOTE — ED Triage Notes (Signed)
Pt has an ulceration/wound on the dorsal left foot with redness and swelling, pt states "its been there for a while".the patient has an orthopedic shoe her son bought

## 2019-08-09 NOTE — Plan of Care (Signed)

## 2019-08-09 NOTE — Progress Notes (Signed)
Called ed for report, no answer at this time.

## 2019-08-09 NOTE — ED Triage Notes (Addendum)
Patient lives at home with her son DON : Whom is her primary caregiver.

## 2019-08-09 NOTE — Progress Notes (Signed)
Pt off the unit to mri .

## 2019-08-09 NOTE — ED Notes (Signed)
Pt ambulatory to toilet with a steady gait. 

## 2019-08-10 ENCOUNTER — Encounter
Admission: EM | Disposition: A | Discharge status: Other Acute Inpatient Hospital | Payer: Self-pay | Source: Home / Self Care | Attending: Emergency Medicine

## 2019-08-10 ENCOUNTER — Other Ambulatory Visit (INDEPENDENT_AMBULATORY_CARE_PROVIDER_SITE_OTHER): Payer: Self-pay | Admitting: Vascular Surgery

## 2019-08-10 ENCOUNTER — Encounter: Payer: Self-pay | Admitting: Physician Assistant

## 2019-08-10 DIAGNOSIS — L0390 Cellulitis, unspecified: Secondary | ICD-10-CM | POA: Diagnosis not present

## 2019-08-10 DIAGNOSIS — I70245 Atherosclerosis of native arteries of left leg with ulceration of other part of foot: Secondary | ICD-10-CM

## 2019-08-10 DIAGNOSIS — L97522 Non-pressure chronic ulcer of other part of left foot with fat layer exposed: Secondary | ICD-10-CM | POA: Diagnosis not present

## 2019-08-10 DIAGNOSIS — L03116 Cellulitis of left lower limb: Secondary | ICD-10-CM | POA: Diagnosis not present

## 2019-08-10 HISTORY — PX: LOWER EXTREMITY ANGIOGRAPHY: CATH118251

## 2019-08-10 LAB — COMPREHENSIVE METABOLIC PANEL
ALT: 21 U/L (ref 0–44)
AST: 37 U/L (ref 15–41)
Albumin: 3.9 g/dL (ref 3.5–5.0)
Alkaline Phosphatase: 65 U/L (ref 38–126)
Anion gap: 10 (ref 5–15)
BUN: 19 mg/dL (ref 8–23)
CO2: 26 mmol/L (ref 22–32)
Calcium: 8.9 mg/dL (ref 8.9–10.3)
Chloride: 104 mmol/L (ref 98–111)
Creatinine, Ser: 0.62 mg/dL (ref 0.44–1.00)
GFR calc Af Amer: >60 mL/min (ref >60)
GFR calc non Af Amer: >60 mL/min (ref >60)
Glucose, Bld: 101 mg/dL — ABNORMAL HIGH (ref 70–99)
Potassium: 3.4 mmol/L — ABNORMAL LOW (ref 3.5–5.1)
Sodium: 140 mmol/L (ref 135–145)
Total Bilirubin: 0.9 mg/dL (ref 0.3–1.2)
Total Protein: 6.7 g/dL (ref 6.5–8.1)

## 2019-08-10 LAB — CBC
HCT: 31.9 % — ABNORMAL LOW (ref 36.0–46.0)
Hemoglobin: 10.5 g/dL — ABNORMAL LOW (ref 12.0–15.0)
MCH: 32.3 pg (ref 26.0–34.0)
MCHC: 32.9 g/dL (ref 30.0–36.0)
MCV: 98.2 fL (ref 80.0–100.0)
Platelets: 275 10*3/uL (ref 150–400)
RBC: 3.25 MIL/uL — ABNORMAL LOW (ref 3.87–5.11)
RDW: 13 % (ref 11.5–15.5)
WBC: 8.6 10*3/uL (ref 4.0–10.5)
nRBC: 0 % (ref 0.0–0.2)

## 2019-08-10 LAB — C-REACTIVE PROTEIN: CRP: 0.5 mg/dL (ref <1.0)

## 2019-08-10 LAB — SARS CORONAVIRUS 2 (TAT 6-24 HRS): SARS Coronavirus 2: NEGATIVE

## 2019-08-10 LAB — GLUCOSE, CAPILLARY: Glucose-Capillary: 79 mg/dL (ref 70–99)

## 2019-08-10 SURGERY — LOWER EXTREMITY ANGIOGRAPHY
Anesthesia: Moderate Sedation | Laterality: Left

## 2019-08-10 MED ORDER — ONDANSETRON HCL 4 MG/2ML IJ SOLN: 4.0000 mg | Freq: Four times a day (QID) | INTRAMUSCULAR | Status: DC | PRN

## 2019-08-10 MED ORDER — TEMAZEPAM 7.5 MG PO CAPS
7.5000 mg | ORAL_CAPSULE | Freq: Every evening | ORAL | Status: DC | PRN
  Administered 2019-08-11: 20:00:00 7.5 mg via ORAL
  Filled 2019-08-10 (×2): qty 1

## 2019-08-10 MED ORDER — FENTANYL CITRATE (PF) 100 MCG/2ML IJ SOLN
INTRAMUSCULAR | Status: DC | PRN
  Administered 2019-08-10 (×2): 25 ug via INTRAVENOUS
  Administered 2019-08-10: 50 ug via INTRAVENOUS
  Administered 2019-08-10: 25 ug via INTRAVENOUS

## 2019-08-10 MED ORDER — MIDAZOLAM HCL 2 MG/ML PO SYRP: 8.0000 mg | ORAL_SOLUTION | Freq: Once | ORAL | Status: DC | PRN

## 2019-08-10 MED ORDER — HEPARIN SODIUM (PORCINE) 1000 UNIT/ML IJ SOLN
INTRAMUSCULAR | Status: DC | PRN
  Administered 2019-08-10: 5000 [IU] via INTRAVENOUS

## 2019-08-10 MED ORDER — MORPHINE SULFATE (PF) 4 MG/ML IV SOLN: 2.0000 mg | INTRAVENOUS | Status: DC | PRN

## 2019-08-10 MED ORDER — SODIUM CHLORIDE 0.9 % IV SOLN: 250.0000 mL | INTRAVENOUS | Status: DC | PRN

## 2019-08-10 MED ORDER — SODIUM CHLORIDE 0.9% FLUSH
3.0000 mL | Freq: Two times a day (BID) | INTRAVENOUS | Status: DC
  Administered 2019-08-11 – 2019-08-13 (×3): 3 mL via INTRAVENOUS

## 2019-08-10 MED ORDER — ACETAMINOPHEN 325 MG PO TABS: 650.0000 mg | ORAL_TABLET | ORAL | Status: DC | PRN

## 2019-08-10 MED ORDER — MIDAZOLAM HCL 5 MG/5ML IJ SOLN
INTRAMUSCULAR | Status: AC
  Filled 2019-08-10: qty 5

## 2019-08-10 MED ORDER — FENTANYL CITRATE (PF) 100 MCG/2ML IJ SOLN
INTRAMUSCULAR | Status: AC
  Filled 2019-08-10: qty 2

## 2019-08-10 MED ORDER — IODIXANOL 320 MG/ML IV SOLN
INTRAVENOUS | Status: DC | PRN
  Administered 2019-08-10: 75 mL via INTRA_ARTERIAL

## 2019-08-10 MED ORDER — SODIUM CHLORIDE 0.9 % IV SOLN: INTRAVENOUS | Status: DC

## 2019-08-10 MED ORDER — CEFAZOLIN SODIUM-DEXTROSE 2-4 GM/100ML-% IV SOLN
INTRAVENOUS | Status: AC
  Administered 2019-08-10: 2 g
  Filled 2019-08-10: qty 100

## 2019-08-10 MED ORDER — HEPARIN SODIUM (PORCINE) 1000 UNIT/ML IJ SOLN
INTRAMUSCULAR | Status: AC
  Filled 2019-08-10: qty 1

## 2019-08-10 MED ORDER — CEFAZOLIN SODIUM-DEXTROSE 2-4 GM/100ML-% IV SOLN: 2.0000 g | Freq: Once | INTRAVENOUS | Status: DC

## 2019-08-10 MED ORDER — SODIUM CHLORIDE 0.9 % IV SOLN: INTRAVENOUS | Status: AC

## 2019-08-10 MED ORDER — METHYLPREDNISOLONE SODIUM SUCC 125 MG IJ SOLR: 125.0000 mg | Freq: Once | INTRAMUSCULAR | Status: DC | PRN

## 2019-08-10 MED ORDER — HYDROMORPHONE HCL 1 MG/ML IJ SOLN: 1.0000 mg | Freq: Once | INTRAMUSCULAR | Status: DC | PRN

## 2019-08-10 MED ORDER — FAMOTIDINE 20 MG PO TABS: 40.0000 mg | ORAL_TABLET | Freq: Once | ORAL | Status: DC | PRN

## 2019-08-10 MED ORDER — MIDAZOLAM HCL 2 MG/2ML IJ SOLN
INTRAMUSCULAR | Status: DC | PRN
  Administered 2019-08-10 (×4): 1 mg via INTRAVENOUS

## 2019-08-10 MED ORDER — SODIUM CHLORIDE 0.9% FLUSH: 3.0000 mL | INTRAVENOUS | Status: DC | PRN

## 2019-08-10 MED ORDER — DIPHENHYDRAMINE HCL 50 MG/ML IJ SOLN: 50.0000 mg | Freq: Once | INTRAMUSCULAR | Status: DC | PRN

## 2019-08-10 MED ORDER — OXYCODONE HCL 5 MG PO TABS: 5.0000 mg | ORAL_TABLET | ORAL | Status: DC | PRN

## 2019-08-10 SURGICAL SUPPLY — 24 items
BALLN LUTONIX 018 5X300X130 (BALLOONS) ×3
BALLN LUTONIX 5X220X130 (BALLOONS) ×3
BALLN LUTONIX DCB 5X100X130 (BALLOONS) ×3
BALLN ULTRASCORE 014 3X40X150 (BALLOONS) ×3
BALLOON LUTONIX 018 5X300X130 (BALLOONS) IMPLANT
BALLOON LUTONIX 5X220X130 (BALLOONS) IMPLANT
BALLOON LUTONIX DCB 5X100X130 (BALLOONS) IMPLANT
BALLOON ULTRSCRE 014 3X40X150 (BALLOONS) IMPLANT
CATH BEACON 5 .035 65 RIM TIP (CATHETERS) ×2 IMPLANT
CATH BEACON 5 .038 100 VERT TP (CATHETERS) ×2 IMPLANT
CATH PIG 70CM (CATHETERS) ×2 IMPLANT
DEVICE PRESTO INFLATION (MISCELLANEOUS) ×2 IMPLANT
DEVICE STARCLOSE SE CLOSURE (Vascular Products) ×2 IMPLANT
GLIDEWIRE ADV .035X260CM (WIRE) ×2 IMPLANT
LIFESTENT SOLO 6X200X135 (Permanent Stent) ×2 IMPLANT
NDL ENTRY 21GA 7CM ECHOTIP (NEEDLE) IMPLANT
NEEDLE ENTRY 21GA 7CM ECHOTIP (NEEDLE) ×3 IMPLANT
PACK ANGIOGRAPHY (CUSTOM PROCEDURE TRAY) ×3 IMPLANT
SET INTRO CAPELLA COAXIAL (SET/KITS/TRAYS/PACK) ×2 IMPLANT
SHEATH ANL2 6FRX45 HC (SHEATH) ×2 IMPLANT
SHEATH BRITE TIP 5FRX11 (SHEATH) ×2 IMPLANT
STENT LIFESTENT 5F 6X100X135 (Permanent Stent) ×2 IMPLANT
WIRE J 3MM .035X145CM (WIRE) ×2 IMPLANT
WIRE RUNTHROUGH .014X300CM (WIRE) ×2 IMPLANT

## 2019-08-10 NOTE — Op Note (Signed)
Tiffany Charles Percutaneous Study/Intervention Procedural Note   Date of Surgery: 08/10/2019  Surgeon:  Katha Cabal, MD.  Pre-operative Diagnosis: Atherosclerotic occlusive disease bilateral lower extremities with ulceration of the left foot  Post-operative diagnosis: Same  Procedure(s) Performed: 1. Introduction catheter into left lower extremity 3rd order catheter placement  2. Contrast injection left lower extremity for distal runoff   3. Percutaneous transluminal angioplasty and stent placement left superficial femoral artery and popliteal 4. Percutaneous transluminal angioplasty left anterior tibial artery.             5.  Star close closure right common femoral arteriotomy  Anesthesia: Conscious sedation was administered under my direct supervision by the interventional radiology RN. IV Versed plus fentanyl were utilized. Continuous ECG, pulse oximetry and blood pressure was monitored throughout the entire procedure.  Conscious sedation was for a total of 80 minutes.  Sheath: 6 [place] Ansell right common femoral retrograde  Contrast: 75 cc  Fluoroscopy Time: 8.1 minutes  Indications: Tiffany Charles presents with nonhealing wound of the left foot.  Pedal pulses are nonpalpable.  This places the patient at increased risk for limb loss.  The risks and benefits for angiography with intervention for limb salvage are reviewed with the patient son all questions answered patient son agrees for [place] to proceed.  Procedure: Tiffany Charles is a 86 y.o. y.o. female who was identified and appropriate procedural time out was performed. The patient was then placed supine on the table and prepped and draped in the usual sterile fashion.   Ultrasound was placed in the sterile sleeve and the right groin was evaluated the right common femoral artery was echolucent and pulsatile indicating patency.  Image was recorded  for the permanent record and under real-time visualization a microneedle was inserted into the common femoral artery microwire followed by a micro-sheath.  A J-wire was then advanced through the micro-sheath and a  5 [place] sheath was then inserted over a J-wire. J-wire was then advanced and a 5 French pigtail catheter was positioned at the level of T12. AP projection of the aorta was then obtained. Pigtail catheter was repositioned to above the bifurcation and a RAO view of the pelvis was obtained.  Subsequently a rim catheter with the stiff angle Glidewire was used to cross the aortic bifurcation the catheter wire were advanced down into the left distal external iliac artery. Oblique view of the femoral bifurcation was then obtained and subsequently the wire was reintroduced and the pigtail catheter negotiated into the SFA representing third order catheter placement. Distal runoff was then performed.  Diagnostic interpretation: The abdominal aorta is opacified with a bolus injection of contrast.  There are diffuse atherosclerotic changes but no hemodynamically significant stenosis.  This is also true of the bilateral common and external iliac arteries which demonstrate diffuse atherosclerotic changes but no hemodynamically significant stenoses.  Internal iliac arteries are patent bilaterally.  The left common femoral and profunda femoris are widely patent.  The superficial femoral artery is patent however in the proximal one third there are multiple greater than 70% stenoses.  At [facility] canal there is a focal 80% stenosis.  Between these 2 areas there is diffuse atherosclerotic changes.  The mid and distal popliteal artery are patent there is atherosclerotic changes but no hemodynamically significant stenoses.  The anterior tibial is patent at its origin but then demonstrates a focal 90% stenosis.  Distal to the stenosis the artery is patent down to the foot filling  the dorsalis pedis and the pedal arch.   There is a flush occlusion of the tibioperoneal trunk and there is complete nonvisualization of the TP trunk posterior tibial and peroneal arteries.  Based on these findings I elected to move forward with intervention.  5000 units of heparin was then given and allowed to circulate and a 6 [place] Ansell sheath was advanced up and over the bifurcation and positioned in the femoral artery  KMP  catheter and stiff angle Glidewire were then negotiated down into the anterior tibial artery and across the lesion.  A 5 mm x 22 cm Lutonix drug-eluting balloon was then positioned with the distal marker in the popliteal artery.  Inflation was to 10 atm for 1 full minute.  A second Lutonix balloon, 5 mm x 100 mm was then used to complete the angioplasty more proximally in the SFA.  Follow-up imaging demonstrated diffuse high-grade residual stenosis throughout the treated segment and I elected to place a stent.  A 6 x 200 life stent was then deployed from the popliteal proximally followed by a 6 x 100 life stent.  The Kumpe catheter was then advanced over the wire down into the anterior tibial the 0.035 wire exchanged for a 0.014 run-through wire.  The Kumpe catheter removed and a 5 mm x 100 cm Lutonix drug eluding balloon was used to post dilate the stents.  Follow-up imaging through the sheath demonstrated excellent result with less than 5% residual stenosis throughout the entire SFA and popliteal arteries.  I then returned my attention to the anterior tibial lesion.  A 3 mm x 40 mm ultra score balloon was advanced across this lesion inflated to 10 atm for 1 full minute.  Follow-up imaging demonstrated resolution of this 90% stenosis there is now less than 5% residual stenosis.  Distal runoff was again performed demonstrating no change with filling of the dorsalis pedis and pedal arch.  Having successfully recannulated the left lower extremity I elected to terminate the procedure.  After review of these images the  sheath is pulled into the right external iliac oblique of the common femoral is obtained and a Star close device deployed. There no immediate complications.   Findings:  The abdominal aorta is opacified with a bolus injection of contrast.  There are diffuse atherosclerotic changes but no hemodynamically significant stenosis.  This is also true of the bilateral common and external iliac arteries which demonstrate diffuse atherosclerotic changes but no hemodynamically significant stenoses.  Internal iliac arteries are patent bilaterally.  The left common femoral and profunda femoris are widely patent.  The superficial femoral artery is patent however in the proximal one third there are multiple greater than 70% stenoses.  At [facility] canal there is a focal 80% stenosis.  Between these 2 areas there is diffuse atherosclerotic changes.  The mid and distal popliteal artery are patent there is atherosclerotic changes but no hemodynamically significant stenoses.  The anterior tibial is patent at its origin but then demonstrates a focal 90% stenosis.  Distal to the stenosis the artery is patent down to the foot filling the dorsalis pedis and the pedal arch.  There is a flush occlusion of the tibioperoneal trunk and there is complete nonvisualization of the TP trunk posterior tibial and peroneal arteries.  Following angioplasty of the SFA and above-knee popliteal there is greater than 50% residual stenosis and therefore life stents are deployed and postdilated with a 5 mm balloon.  There is now less than 5% residual stenosis.  Following angioplasty anterior tibial tibial now is in-line flow and looks quite nice with less than 5% residual stenosis.   Summary: Successful recanalization left lower extremity for limb salvage    Disposition: Patient was taken to the recovery room in stable condition having tolerated the procedure well.  Tiffany Charles, Dolores Lory 08/10/2019,5:19 PM

## 2019-08-10 NOTE — Consult Note (Signed)
[facility] Vascular Consult Note  MRN : [id number]  Tiffany Charles is a 86 y.o. (22-Dec-1929) female who presents with chief complaint of  Chief Complaint  Patient presents with  . Wound Infection   History of Present Illness:  Tiffany Charles is a 86 year old female with medical history significant of afib on xarelto, HFpEF, COPD, CAD, HTN and multiple other medical problems presenting with a chronic left foot wound.  Of note, patient is slightly confused during this examination and information for this consult was obtained to previous EPIC notation and other clinical team members.  Patient fell and had injury to left foot in late February.  Since then, her son has been trying to care for it, but it's gotten progressively worse.  It was seen by family friends who are doctors who recommended being seen in the hospital.  The patient denies pain, fevers, chills.  She denies any shortness of breath or chest pain.  Vascular surgery was consulted by Dr. Florene Glen for further recommendations.  Current Facility-Administered Medications  Medication Dose Route Frequency Provider Last Rate Last Admin  . 0.9 %  sodium chloride infusion   Intravenous Continuous Sione Baumgarten A, PA-C      . acetaminophen (TYLENOL) tablet 650 mg  650 mg Oral Q6H PRN Elodia Florence., MD   650 mg at 08/09/19 2129   Or  . acetaminophen (TYLENOL) suppository 650 mg  650 mg Rectal Q6H PRN Elodia Florence., MD      . acitretin [facility]) capsule 25 mg  25 mg Oral Daily Elodia Florence., MD      . ascorbic acid (VITAMIN C) tablet 500 mg  500 mg Oral Daily Elodia Florence., MD   500 mg at 08/09/19 2334  . aspirin EC tablet 81 mg  81 mg Oral Daily Elodia Florence., MD      . atorvastatin (LIPITOR) tablet 40 mg  40 mg Oral Daily Elodia Florence., MD      . cefTRIAXone (ROCEPHIN) 1 g in sodium chloride 0.9 % 100 mL IVPB  1 g Intravenous Q24H Elodia Florence., MD      . doxycycline (VIBRA-TABS) tablet 100 mg  100 mg Oral Q12H Elodia Florence., MD   100 mg at 08/09/19 2334  . metoprolol succinate (TOPROL-XL) 24 hr tablet 25 mg  25 mg Oral Daily Elodia Florence., MD      . Rivaroxaban Alveda Reasons) tablet 15 mg  15 mg Oral Q supper Elodia Florence., MD      . sodium chloride flush (NS) 0.9 % injection 3 mL  3 mL Intravenous Once Elodia Florence., MD      . temazepam (RESTORIL) capsule 7.5 mg  7.5 mg Oral QHS PRN Para Skeans, MD      . vitamin B-12 (CYANOCOBALAMIN) tablet 1,000 mcg  1,000 mcg Oral Daily Elodia Florence., MD   1,000 mcg at 08/09/19 2334   Past Medical History:  Diagnosis Date  . Angina pectoris   . Arthritis   . Chronic anticoagulation    Xarelto  . Chronic diastolic (congestive) heart failure ([place])   . COPD (chronic obstructive pulmonary disease) ([place])   . Coronary artery disease    a. s/p RCA stent 2008. b. cath 02/2009: 30% mLAD, 90% AV groove cx unchanged from prior, 50-60% RCA, EF 60% -> med rx.  . Depression   .  Essential hypertension   . Hyperlipidemia   . Mild pulmonary hypertension ([place]) 09/17/2016  . Permanent atrial fibrillation ([place])   . Skin cancer Jan 2013   squamous cell- removed  . Tricuspid regurgitation 09/17/2016   Past Surgical History:  Procedure Laterality Date  .  CATARACTS REMOVED    . ABDOMINAL HYSTERECTOMY    . CARDIAC CATHETERIZATION  03/22/2009   Started medical therapy  . CARDIOVERSION  06/05/2009   3 attempts, last attempt successful  . CORONARY ANGIOPLASTY WITH STENT PLACEMENT  2009   RCA Vision stent  . HIP SURGERY    . MELANOMA EXCISION  06/06/2011   Procedure: MELANOMA EXCISION;  Surgeon: Adin Hector, MD;  Location: [facility];  Service: General;  Laterality: Left;  re excision squamous cell lesion left chest wall    Social History Social History   Tobacco Use  . Smoking status: Former Smoker    Packs/day: 0.50  . Smokeless tobacco: Never Used    Substance Use Topics  . Alcohol use: No  . Drug use: No   Family History Family History  Problem Relation Age of Onset  . Stroke Mother   . Heart disease Mother   . Coronary artery disease Father   . Heart disease Father   . Stroke Sister   . Heart disease Brother   . Stroke Sister   . Stroke Sister   Denies family history of peripheral artery disease, venous disease or renal disease.  Allergies  Allergen Reactions  . Codeine Nausea And Vomiting   REVIEW OF SYSTEMS (Negative unless checked)  Constitutional: [] Weight loss  [] Fever  [] Chills Cardiac: [] Chest pain   [] Chest pressure   [] Palpitations   [] Shortness of breath when laying flat   [] Shortness of breath at rest   [] Shortness of breath with exertion. Vascular:  [] Pain in legs with walking   [] Pain in legs at rest   [] Pain in legs when laying flat   [] Claudication   [] Pain in feet when walking  [] Pain in feet at rest  [] Pain in feet when laying flat   [] History of DVT   [] Phlebitis   [] Swelling in legs   [] Varicose veins   [x] Non-healing ulcers Pulmonary:   [] Uses home oxygen   [] Productive cough   [] Hemoptysis   [] Wheeze  [] COPD   [] Asthma Neurologic:  [] Dizziness  [] Blackouts   [] Seizures   [] History of stroke   [] History of TIA  [] Aphasia   [] Temporary blindness   [] Dysphagia   [] Weakness or numbness in arms   [] Weakness or numbness in legs Musculoskeletal:  [] Arthritis   [] Joint swelling   [] Joint pain   [] Low back pain Hematologic:  [] Easy bruising  [] Easy bleeding   [] Hypercoagulable state   [] Anemic  [] Hepatitis Gastrointestinal:  [] Blood in stool   [] Vomiting blood  [] Gastroesophageal reflux/heartburn   [] Difficulty swallowing. Genitourinary:  [] Chronic kidney disease   [] Difficult urination  [] Frequent urination  [] Burning with urination   [] Blood in urine Skin:  [] Rashes   [x] Ulcers   [x] Wounds Psychological:  [] History of anxiety   []  History of major depression.  Physical Examination  Vitals:   08/09/19 2109  08/10/19 0023 08/10/19 0742 08/10/19 0956  BP: (!) 161/99 (!) 147/90 (!) 156/104 125/74  Pulse: 79 93 (!) 114 84  Resp:  18 18 18   Temp: 97.6 F (36.4 C) 97.7 F (36.5 C) 97.9 F (36.6 C) 98.1 F (36.7 C)  TempSrc: Oral  Oral   SpO2: 100% 100% 100% 100%  Weight:  Height:       Body mass index is 21.26 kg/m. Gen:  WD/WN, NAD Head: [place]/AT, No temporalis wasting. Prominent temp pulse not noted. Ear/Nose/Throat: Hearing grossly intact, nares w/o erythema or drainage, oropharynx w/o Erythema/Exudate Eyes: Sclera non-icteric, conjunctiva clear Neck: Trachea midline.  No JVD.  Pulmonary:  Good air movement, respirations not labored, equal bilaterally.  Cardiac: Irregularly irregular Vascular:  Vessel Right Left  Radial Palpable Palpable  Ulnar Palpable Palpable  Brachial Palpable Palpable  Carotid Palpable, without bruit Palpable, without bruit  Aorta Not palpable N/A  Femoral Palpable Palpable  Popliteal Palpable Palpable  PT Palpable Non-Palpable  DP Palpable Non-Palpable   Left lower extremity: Thigh soft.  Calf soft extremities warm distally toes.  Unable to palpate pedal pulses.  There is a necrotic ulceration noted to the dorsum of the foot.    Photos    08/10/2019 08:07  Attached To:  Hospital Encounter on 08/09/19  Source Information Sharlotte Alamo, DPM  [facility] (1c)   Gastrointestinal: soft, non-tender/non-distended. No guarding/reflex.  Musculoskeletal: M/S 5/5 throughout.  Extremities without ischemic changes.  No deformity or atrophy. Mild edema. Neurologic: Sensation grossly intact in extremities.  Symmetrical.  Speech is fluent. Motor exam as listed above. Psychiatric: Judgment intact, Mood & affect appropriate for pt's clinical situation. Dermatologic: As above Lymph : No Cervical, Axillary, or Inguinal lymphadenopathy.  CBC Lab Results  Component Value Date   WBC 8.6 08/10/2019   HGB 10.5 (L) 08/10/2019   HCT 31.9 (L) 08/10/2019   MCV 98.2  08/10/2019   PLT 275 08/10/2019   BMET    Component Value Date/Time   NA 140 08/10/2019 0501   NA 139 07/30/2017 1102   K 3.4 (L) 08/10/2019 0501   CL 104 08/10/2019 0501   CO2 26 08/10/2019 0501   GLUCOSE 101 (H) 08/10/2019 0501   BUN 19 08/10/2019 0501   BUN 7 (L) 07/30/2017 1102   CREATININE 0.62 08/10/2019 0501   CREATININE 0.87 07/11/2013 0918   CALCIUM 8.9 08/10/2019 0501   GFRNONAA >60 08/10/2019 0501   GFRAA >60 08/10/2019 0501   Estimated Creatinine Clearance: 41 mL/min (by C-G formula based on SCr of 0.62 mg/dL).  COAG Lab Results  Component Value Date   INR 1.0 06/03/2019   INR 1.54 09/16/2016   INR 1.25 07/30/2015   Radiology MR FOOT LEFT W WO CONTRAST  Result Date: 08/10/2019 CLINICAL DATA:  Dorsal foot ulceration with surrounding erythema and swelling. Cellulitis. EXAM: MRI OF THE LEFT FOREFOOT WITHOUT AND WITH CONTRAST TECHNIQUE: Multiplanar, multisequence MR imaging of the left forefoot was performed both before and after administration of intravenous contrast. CONTRAST:  3mL GADAVIST GADOBUTROL 1 MMOL/ML IV SOLN COMPARISON:  Radiographs 08/09/2019 and 07/20/2019 FINDINGS: Bones/Joint/Cartilage No evidence of acute fracture, dislocation, cortical destruction or marrow edema. Specifically, no evidence of osteomyelitis. Mild degenerative changes are present at the 1st metatarsophalangeal joint and in the midfoot. No significant joint effusions or suspicious synovial enhancement. Ligaments The Lisfranc ligament is intact. The collateral ligaments of the metatarsophalangeal joints appear intact. Muscles and Tendons The forefoot tendons appear intact without significant tenosynovitis or abnormal associated enhancement. No significant muscular abnormalities. Soft tissues There is focal skin ulceration dorsally over the mid 2nd and 3rd metatarsal shafts. There is an underlying ill-defined superficial fluid collection which is dorsal to the extensor tendons. This fluid  collection measures approximately 2.6 x 2.2 x 1.0 cm and demonstrates peripheral enhancement following contrast. There is surrounding soft tissue edema and enhancement without additional  focal fluid collection. No definite foreign body or other fluid collection identified in the forefoot. IMPRESSION: 1. Focal skin ulceration dorsally over the mid 2nd and 3rd metatarsal heads with underlying ill-defined superficial fluid collection and surrounding soft tissue edema. Findings are consistent with an abscess. 2. No evidence of osteomyelitis, septic joint or infectious tenosynovitis. 3. Mild degenerative changes in the 1st metatarsophalangeal and midfoot. 4. A preliminary report relating these findings was issued by Dr. Marguerita Merles at 2310 hours on 08/09/2019. Electronically Signed   By: Richardean Sale M.D.   On: 08/10/2019 07:59   DG Foot 2 Views Left  Result Date: 08/09/2019 CLINICAL DATA:  Pain EXAM: LEFT FOOT - 2 VIEW COMPARISON:  07/20/2019 FINDINGS: There is soft tissue swelling about the dorsal aspect of the foot. There is no acute displaced fracture no dislocation. No radiographic evidence for osteomyelitis. IMPRESSION: No acute osseous abnormality. Soft tissue swelling about the dorsal aspect of the foot. Electronically Signed   By: Constance Holster M.D.   On: 08/09/2019 15:55   DG Foot Complete Left  Result Date: 07/20/2019 CLINICAL DATA:  Fall EXAM: LEFT FOOT - COMPLETE 3+ VIEW COMPARISON:  None. FINDINGS: Negative for fracture. Mild hallux valgus deformity. Extension deformities of the second through fifth metatarsal phalangeal joint. Soft tissue swelling and gas in the soft tissues dorsally due to contusion and laceration. IMPRESSION: Negative for fracture. Electronically Signed   By: Franchot Gallo M.D.   On: 07/20/2019 14:41   Assessment/Plan The patient is an 86 year old female with multiple medical issues who presented to the [facility] emergency department with a chronic  wound to the dorsum of the left foot  1.  Chronic left foot wound: Patient presents with a chronic wound of approximately 2 weeks to the dorsum of the left foot.  There are minimal signs of healing.  Unable to palpate pedal pulses.  In the setting of a chronic wound and nonpalpable pedal pulses recommend undergoing a left lower extremity angiogram possible intervention and attempt assess the patient's anatomy and contributing degree of atherosclerotic disease.  If appropriate an attempt can be made to revascularize the leg at that time.  Procedure, risks and benefits explained to the patient and her son.  Consent was obtained.  All questions answered.  2.  Hyperlipidemia: On statin for medical management. Would recommend the addition of aspirin 81 mg Encouraged good control as its slows the progression of atherosclerotic disease This is followed by the patient's primary care physician/cardiologist  3.  Hypertension: On appropriate medications Encouraged good control as its slows the progression of atherosclerotic disease This is followed by the patient's primary care physician/cardiologist  Discussed with Dr. Francene Castle, PA-C  08/10/2019 11:33 AM  This note was created with Dragon medical transcription system.  Any error is purely unintentional

## 2019-08-10 NOTE — Consult Note (Addendum)
Reason for Consult: Ulceration left foot. Referring Physician: Dr. Milus Height is an 86 y.o. female.  HPI: This is an 86 year old female with a complaint of a sore on the top of her left foot.  Patient states it is been present for a "long time" but cannot specifically recall any injury.  States it is really not been painful.  Patient does have dementia and is a poor historian.  Upon review of notes patient was noted to have a laceration several weeks ago treated outpatient in the emergency department with subsequent follow-up to the emergency department earlier this month.  Presents back to our emergency department and was admitted for continued ulceration with sign of infection in the left foot.  Past Medical History:  Diagnosis Date  . Angina pectoris   . Arthritis   . Chronic anticoagulation    Xarelto  . Chronic diastolic (congestive) heart failure ([place])   . COPD (chronic obstructive pulmonary disease) ([place])   . Coronary artery disease    a. s/p RCA stent 2008. b. cath 02/2009: 30% mLAD, 90% AV groove cx unchanged from prior, 50-60% RCA, EF 60% -> med rx.  . Depression   . Essential hypertension   . Hyperlipidemia   . Mild pulmonary hypertension ([place]) 09/17/2016  . Permanent atrial fibrillation ([place])   . Skin cancer Jan 2013   squamous cell- removed  . Tricuspid regurgitation 09/17/2016    Past Surgical History:  Procedure Laterality Date  .  CATARACTS REMOVED    . ABDOMINAL HYSTERECTOMY    . CARDIAC CATHETERIZATION  03/22/2009   Started medical therapy  . CARDIOVERSION  06/05/2009   3 attempts, last attempt successful  . CORONARY ANGIOPLASTY WITH STENT PLACEMENT  2009   RCA Vision stent  . HIP SURGERY    . MELANOMA EXCISION  06/06/2011   Procedure: MELANOMA EXCISION;  Surgeon: Adin Hector, MD;  Location: [facility];  Service: General;  Laterality: Left;  re excision squamous cell lesion left chest wall     Family History  Problem Relation Age of Onset  .  Stroke Mother   . Heart disease Mother   . Coronary artery disease Father   . Heart disease Father   . Stroke Sister   . Heart disease Brother   . Stroke Sister   . Stroke Sister     Social History:  reports that she has quit smoking. She smoked 0.50 packs per day. She has never used smokeless tobacco. She reports that she does not drink alcohol or use drugs.  Allergies:  Allergies  Allergen Reactions  . Codeine Nausea And Vomiting    Medications:  Scheduled: . acitretin  25 mg Oral Daily  . ascorbic acid  500 mg Oral Daily  . aspirin EC  81 mg Oral Daily  . atorvastatin  40 mg Oral Daily  . doxycycline  100 mg Oral Q12H  . metoprolol succinate  25 mg Oral Daily  . Rivaroxaban  15 mg Oral Q supper  . sodium chloride flush  3 mL Intravenous Once  . vitamin B-12  1,000 mcg Oral Daily    Results for orders placed or performed during the hospital encounter of 08/09/19 (from the past 48 hour(s))  Comprehensive metabolic panel     Status: Abnormal   Collection Time: 08/09/19  3:07 PM  Result Value Ref Range   Sodium 139 135 - 145 mmol/L   Potassium 3.9 3.5 - 5.1 mmol/L   Chloride 103 98 -  111 mmol/L   CO2 29 22 - 32 mmol/L   Glucose, Bld 121 (H) 70 - 99 mg/dL    Comment: Glucose reference range applies only to samples taken after fasting for at least 8 hours.   BUN 17 8 - 23 mg/dL   Creatinine, Ser 0.88 0.44 - 1.00 mg/dL   Calcium 9.3 8.9 - 10.3 mg/dL   Total Protein 7.3 6.5 - 8.1 g/dL   Albumin 4.0 3.5 - 5.0 g/dL   AST 35 15 - 41 U/L   ALT 18 0 - 44 U/L   Alkaline Phosphatase 70 38 - 126 U/L   Total Bilirubin 0.7 0.3 - 1.2 mg/dL   GFR calc non Af Amer 59 (L) >60 mL/min   GFR calc Af Amer >60 >60 mL/min   Anion gap 7 5 - 15    Comment: Performed at [facility], [address] [place], [place] [address]  CBC with Differential     Status: Abnormal   Collection Time: 08/09/19  3:07 PM  Result Value Ref Range   WBC 8.8 4.0 - 10.5 K/uL   RBC 3.44 (L) 3.87 -  5.11 MIL/uL   Hemoglobin 11.2 (L) 12.0 - 15.0 g/dL   HCT 33.8 (L) 36.0 - 46.0 %   MCV 98.3 80.0 - 100.0 fL   MCH 32.6 26.0 - 34.0 pg   MCHC 33.1 30.0 - 36.0 g/dL   RDW 13.2 11.5 - 15.5 %   Platelets 300 150 - 400 K/uL   nRBC 0.0 0.0 - 0.2 %   Neutrophils Relative % 63 %   Neutro Abs 5.5 1.7 - 7.7 K/uL   Lymphocytes Relative 26 %   Lymphs Abs 2.3 0.7 - 4.0 K/uL   Monocytes Relative 7 %   Monocytes Absolute 0.6 0.1 - 1.0 K/uL   Eosinophils Relative 3 %   Eosinophils Absolute 0.2 0.0 - 0.5 K/uL   Basophils Relative 1 %   Basophils Absolute 0.1 0.0 - 0.1 K/uL   Immature Granulocytes 0 %   Abs Immature Granulocytes 0.03 0.00 - 0.07 K/uL    Comment: Performed at [facility], [address] [place], [place] [address]  ESR     Status: Abnormal   Collection Time: 08/09/19  3:07 PM  Result Value Ref Range   Sed Rate 40 (H) 0 - 30 mm/hr    Comment: Performed at [facility], [address] [place], [place] [address]  Blood culture (routine x 2)     Status: None (Preliminary result)   Collection Time: 08/09/19  5:35 PM   Specimen: BLOOD  Result Value Ref Range   Specimen Description BLOOD RIGHT ANTECUBITAL    Special Requests      BOTTLES DRAWN AEROBIC AND ANAEROBIC Blood Culture adequate volume   Culture      NO GROWTH < 24 HOURS Performed at [facility], [address] [place], [place] [address]    Report Status PENDING   Blood culture (routine x 2)     Status: None (Preliminary result)   Collection Time: 08/09/19  5:35 PM   Specimen: BLOOD  Result Value Ref Range   Specimen Description      BLOOD Blood Culture results may not be optimal due to an inadequate volume of blood received in culture bottles   Special Requests      BOTTLES DRAWN AEROBIC AND ANAEROBIC LEFT ANTECUBITAL   Culture      NO GROWTH < 24 HOURS Performed at  [facility], [address] [place], [place] [address]    Report Status PENDING   Lactic acid, plasma      Status: None   Collection Time: 08/09/19  5:35 PM  Result Value Ref Range   Lactic Acid, Venous 1.0 0.5 - 1.9 mmol/L    Comment: Performed at [facility], [place], [place] [address]  SARS CORONAVIRUS 2 (TAT 6-24 HRS) Nasopharyngeal Nasopharyngeal Swab     Status: None   Collection Time: 08/09/19  8:08 PM   Specimen: Nasopharyngeal Swab  Result Value Ref Range   SARS Coronavirus 2 NEGATIVE NEGATIVE    Comment: (NOTE) SARS-CoV-2 target nucleic acids are NOT DETECTED. The SARS-CoV-2 RNA is generally detectable in upper and lower respiratory specimens during the acute phase of infection. Negative results do not preclude SARS-CoV-2 infection, do not rule out co-infections with other pathogens, and should not be used as the sole basis for treatment or other patient management decisions. Negative results must be combined with clinical observations, patient history, and epidemiological information. The expected result is Negative. Fact Sheet for Patients: SugarRoll.be Fact Sheet for Healthcare Providers: https://www.woods-mathews.com/ This test is not yet approved or cleared by the [place] FDA and  has been authorized for detection and/or diagnosis of SARS-CoV-2 by FDA under an Emergency Use Authorization (EUA). This EUA will remain  in effect (meaning this test can be used) for the duration of the COVID-19 declaration under Section 56 4(b)(1) of the Act, 21 U.S.C. section 360bbb-3(b)(1), unless the authorization is terminated or revoked sooner. Performed at [facility], [place] [address], [place], [place] [address]   Comprehensive metabolic panel     Status: Abnormal   Collection Time: 08/10/19  5:01 AM  Result Value Ref Range   Sodium 140 135 - 145 mmol/L   Potassium 3.4 (L) 3.5 - 5.1 mmol/L   Chloride 104 98 - 111 mmol/L   CO2 26 22 - 32 mmol/L   Glucose, Bld 101 (H) 70 - 99 mg/dL    Comment: Glucose  reference range applies only to samples taken after fasting for at least 8 hours.   BUN 19 8 - 23 mg/dL   Creatinine, Ser 0.62 0.44 - 1.00 mg/dL   Calcium 8.9 8.9 - 10.3 mg/dL   Total Protein 6.7 6.5 - 8.1 g/dL   Albumin 3.9 3.5 - 5.0 g/dL   AST 37 15 - 41 U/L   ALT 21 0 - 44 U/L   Alkaline Phosphatase 65 38 - 126 U/L   Total Bilirubin 0.9 0.3 - 1.2 mg/dL   GFR calc non Af Amer >60 >60 mL/min   GFR calc Af Amer >60 >60 mL/min   Anion gap 10 5 - 15    Comment: Performed at [facility], [address] [place], [place] [address]  CBC     Status: Abnormal   Collection Time: 08/10/19  5:01 AM  Result Value Ref Range   WBC 8.6 4.0 - 10.5 K/uL   RBC 3.25 (L) 3.87 - 5.11 MIL/uL   Hemoglobin 10.5 (L) 12.0 - 15.0 g/dL   HCT 31.9 (L) 36.0 - 46.0 %   MCV 98.2 80.0 - 100.0 fL   MCH 32.3 26.0 - 34.0 pg   MCHC 32.9 30.0 - 36.0 g/dL   RDW 13.0 11.5 - 15.5 %   Platelets 275 150 - 400 K/uL   nRBC 0.0 0.0 - 0.2 %    Comment: Performed at [facility], [place], [place] [address]    MR FOOT LEFT W WO CONTRAST  Result Date: 08/10/2019 CLINICAL DATA:  Dorsal foot ulceration with surrounding erythema and swelling. Cellulitis. EXAM: MRI OF THE LEFT FOREFOOT WITHOUT AND WITH CONTRAST TECHNIQUE: Multiplanar, multisequence MR imaging of the left forefoot was performed both before and after administration of intravenous contrast. CONTRAST:  25m GADAVIST GADOBUTROL 1 MMOL/ML IV SOLN COMPARISON:  Radiographs 08/09/2019 and 07/20/2019 FINDINGS: Bones/Joint/Cartilage No evidence of acute fracture, dislocation, cortical destruction or marrow edema. Specifically, no evidence of osteomyelitis. Mild degenerative changes are present at the 1st metatarsophalangeal joint and in the midfoot. No significant joint effusions or suspicious synovial enhancement. Ligaments The Lisfranc ligament is intact. The collateral ligaments of the metatarsophalangeal joints appear intact. Muscles and  Tendons The forefoot tendons appear intact without significant tenosynovitis or abnormal associated enhancement. No significant muscular abnormalities. Soft tissues There is focal skin ulceration dorsally over the mid 2nd and 3rd metatarsal shafts. There is an underlying ill-defined superficial fluid collection which is dorsal to the extensor tendons. This fluid collection measures approximately 2.6 x 2.2 x 1.0 cm and demonstrates peripheral enhancement following contrast. There is surrounding soft tissue edema and enhancement without additional focal fluid collection. No definite foreign body or other fluid collection identified in the forefoot. IMPRESSION: 1. Focal skin ulceration dorsally over the mid 2nd and 3rd metatarsal heads with underlying ill-defined superficial fluid collection and surrounding soft tissue edema. Findings are consistent with an abscess. 2. No evidence of osteomyelitis, septic joint or infectious tenosynovitis. 3. Mild degenerative changes in the 1st metatarsophalangeal and midfoot. 4. A preliminary report relating these findings was issued by Dr. DMarguerita Merlesat 2310 hours on 08/09/2019. Electronically Signed   By: WRichardean SaleM.D.   On: 08/10/2019 07:59   DG Foot 2 Views Left  Result Date: 08/09/2019 CLINICAL DATA:  Pain EXAM: LEFT FOOT - 2 VIEW COMPARISON:  07/20/2019 FINDINGS: There is soft tissue swelling about the dorsal aspect of the foot. There is no acute displaced fracture no dislocation. No radiographic evidence for osteomyelitis. IMPRESSION: No acute osseous abnormality. Soft tissue swelling about the dorsal aspect of the foot. Electronically Signed   By: CConstance HolsterM.D.   On: 08/09/2019 15:55    Review of Systems  Constitutional: Negative for chills and fever.  HENT: Negative for sinus pain and sore throat.   Respiratory: Negative for cough and shortness of breath.   Gastrointestinal: Negative for nausea and vomiting.  Musculoskeletal: Negative for arthralgias  and joint swelling.  Neurological: Negative for numbness.   Blood pressure 125/74, pulse 84, temperature 98.1 F (36.7 C), resp. rate 18, height _0  (1.6 m), weight 54.4 kg, SpO2 100 %. Physical Exam  Cardiovascular:  DP pulses trace bilateral.  PT pulse could not clearly be palpated bilateral.  Musculoskeletal:     Comments: Adequate range of motion of the pedal joints with some guarding in the left foot.  Neurological:  Protective threshold with a monofilament wire grossly intact and symmetric bilateral.  Skin:  The skin is warm dry and atrophic.  A dorsal eschar with ulceration is noted on the top of the left foot approximately 4.5 cm x 3 cm predebridement.  Post debridement there is a full-thickness ulceration approximately 2 cm x 1.5 cm with some central significant hematoma formation.  No purulence was encountered.  Some mild surrounding erythema.  Some scaly hyperkeratosis noted down into the digital areas and forefoot.  Assessment/Plan: Assessment: Full-thickness ulceration with hematoma left foot.  Plan: Debrided devitalized tissue excisionally from the ulceration on the left forefoot partial and full-thickness including the deeper subcutaneous tissue and hematoma sharply using a pair of tissue nippers.  Sterile saline wet-to-dry dressing applied to the left foot.  Patient will need continued local wound care.  Also would recommend antibiotics on discharge.  No clear sign of any purulence or infection and I do not think that formal I&D in the operating room is needed at this point.  Patient will follow up in a couple of weeks outpatient upon discharge or she could also pursue treatment at the wound care center.  Durward Fortes 08/10/2019, 10:07 AM

## 2019-08-10 NOTE — NC FL2 (Signed)
[place] LEVEL OF CARE SCREENING TOOL     IDENTIFICATION  Patient Name: Tiffany Charles Birthdate: Dec 28, 1929 Sex: female Admission Date (Current Location): 08/09/2019  [place] and [place] Number:  Engineering geologist and Address:  [facility], [address], [place], [place] [address]      Provider Number: [id number]  Attending Physician Name and Address:  Para Skeans, MD  Relative Name and Phone Number:  Bridgitt Raggio [phone]    Current Level of Care: Hospital Recommended Level of Care: [place] Prior Approval Number:    Date Approved/Denied:   PASRR Number: [id number]  Discharge Plan: SNF    Current Diagnoses: Patient Active Problem List   Diagnosis Date Noted  . Cellulitis 08/09/2019  . Cellulitis of left foot   . Left foot pain 07/22/2019  . Open wound of left foot 07/22/2019  . DNR (do not resuscitate) 06/08/2019  . Occipital stroke ([place]) 06/07/2019  . Squamous cell carcinoma, leg, unspecified laterality 06/08/2018  . Basal cell carcinoma (BCC) of nasal tip 06/08/2018  . Senile dementia without behavioral disturbance ([place]) 04/29/2017  . Frequent PVCs 09/17/2016  . Tricuspid regurgitation 09/17/2016  . Mild pulmonary hypertension ([place]) 09/17/2016  . Chronic diastolic (congestive) heart failure ([place])   . Essential hypertension   . Generalized OA 12/28/2014  . Routine general medical examination at a health care facility 11/09/2013  . Depression, major ([place]) 11/07/2013  . Chronic anticoagulation- Xarelto 12/28/2012  . Permanent atrial fibrillation 10/28/2012  . COPD - PFTs in June 2014 with MET test 10/28/2012  . CAD, RCA BMS '09, 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 low risk Myoview Feb 2013 10/28/2012  . Hyperlipidemia 10/28/2012    Orientation RESPIRATION BLADDER Height & Weight     Self, Situation, Place  Normal Continent Weight: 120 lb (54.4 kg) Height:  _0  (160 cm)   BEHAVIORAL SYMPTOMS/MOOD NEUROLOGICAL BOWEL NUTRITION STATUS      Continent Diet(carb modified)  AMBULATORY STATUS COMMUNICATION OF NEEDS Skin   Extensive Assist Verbally Other (Comment)(cellulitis left foot)                       Personal Care Assistance Level of Assistance  Bathing, Dressing Bathing Assistance: Limited assistance   Dressing Assistance: Limited assistance     Functional Limitations Info             SPECIAL CARE FACTORS FREQUENCY  PT (By licensed PT), OT (By licensed OT)     PT Frequency: 5x week OT Frequency: 3-5 x week            Contractures      Additional Factors Info  Code Status, Allergies Code Status Info: Full Code Allergies Info: Codeine           Current Medications (08/10/2019):  This is the current hospital active medication list Current Facility-Administered Medications  Medication Dose Route Frequency Provider Last Rate Last Admin  . 0.9 %  sodium chloride infusion   Intravenous Continuous Stegmayer, Kimberly A, PA-C      . acetaminophen (TYLENOL) tablet 650 mg  650 mg Oral Q6H PRN Elodia Florence., MD   650 mg at 08/09/19 2129   Or  . acetaminophen (TYLENOL) suppository 650 mg  650 mg Rectal Q6H PRN Elodia Florence., MD      . acitretin [facility]) capsule 25 mg  25 mg Oral Daily Elodia Florence., MD      .  ascorbic acid (VITAMIN C) tablet 500 mg  500 mg Oral Daily Elodia Florence., MD   500 mg at 08/09/19 2334  . aspirin EC tablet 81 mg  81 mg Oral Daily Elodia Florence., MD      . atorvastatin (LIPITOR) tablet 40 mg  40 mg Oral Daily Elodia Florence., MD      . cefTRIAXone (ROCEPHIN) 1 g in sodium chloride 0.9 % 100 mL IVPB  1 g Intravenous Q24H Elodia Florence., MD      . doxycycline (VIBRA-TABS) tablet 100 mg  100 mg Oral Q12H Elodia Florence., MD   100 mg at 08/09/19 2334  . metoprolol succinate (TOPROL-XL) 24 hr tablet 25 mg  25 mg Oral Daily Elodia Florence., MD       . Rivaroxaban [facility]) tablet 15 mg  15 mg Oral Q supper Elodia Florence., MD      . sodium chloride flush (NS) 0.9 % injection 3 mL  3 mL Intravenous Once Elodia Florence., MD      . vitamin B-12 (CYANOCOBALAMIN) tablet 1,000 mcg  1,000 mcg Oral Daily Elodia Florence., MD   1,000 mcg at 08/09/19 2334     Discharge Medications: Please see discharge summary for a list of discharge medications.  Relevant Imaging Results:  Relevant Lab Results:   Additional Information ssn: [id number]  Elease Hashimoto, LCSW

## 2019-08-10 NOTE — Progress Notes (Signed)
Patient ID: Tiffany Charles, female   DOB: [birth year], 86 y.o.   MRN: [id number] Spoke with the patient and her son who was present this evening.  Discussed with the son that the wound on her left foot did not appear to be significantly infected earlier today.  Also discussed that her revascularization procedure was successful to improve her circulation.  At this point I think she should be able to be discharged with local wound care that may be best served in a skilled nursing setting but otherwise may need home health care.  Her son is concerned that he may not be able to adequately care for her at home.  For now sterile saline wet-to-dry gauze dressings to the wound on the left foot daily.  I will plan for follow-up with the patient in a couple of weeks outpatient.

## 2019-08-10 NOTE — TOC Progression Note (Signed)
Transition of Care [facility]) - Progression Note    Patient Details  Name: GRACIELLA CREMER MRN: [id number] Date of Birth: [birth year]  Transition of Care [facility] [place]) CM/SW Contact  Gavin Telford, Gardiner Rhyme, LCSW Phone Number: 08/10/2019, 10:28 AM  Clinical Narrative:   Spoke with son-Don via telephone to discuss discharge plan. He reports she was at [facility] and only stayed two days due to care issues and pt begging son to come and get her and he did after two days. He feels now she will need to go to another SNF for care, since he has not been doing well with her wound at home. Will start FL2 and paperwork to see if can find another SNF as long as PT see's and recommends this. Will ask MD for order         Expected Discharge Plan and Services                                                 Social Determinants of Health (SDOH) Interventions    Readmission Risk Interventions No flowsheet data found.

## 2019-08-10 NOTE — Progress Notes (Signed)
PT Cancellation Note  Patient Details Name: MAKINLEY MCCALMAN MRN: [id number] DOB: May 17, 1930   Cancelled Treatment:    Reason Eval/Treat Not Completed: Patient at procedure or test/unavailable.  PT consult received.  Pt currently off unit for procedure.  Will re-attempt PT evaluation at a later date/time as medically appropriate.  Leitha Bleak, PT 08/10/19, 1:04 PM

## 2019-08-10 NOTE — H&P (Signed)
History and Physical    Tiffany Charles [id number] DOB: [birth year] DOA: 08/09/2019  PCP: [name], MD  Patient coming from: home   Chief Complaint: left foot wound and cellulitis  HPI: Tiffany Charles is a 86 y.o. female with medical history significant of afib on xarelto, HFpEF, COPD, CAD, HTN and multiple other medical problems presenting with a left foot wound.  Patient fell and had injury to left foot in late February.  Since then, her son has been trying to care for it, but it's gotten progressively worse.  It was seen by family friends who are doctors who recommended being seen in the hospital.  The patient denies pain, fevers, chills.  She can't tell me clearly why she's here.  The history was obtained from chart review and the son.  08/10/2019: Pt seen today is alert awake and doing well. Pt is confused but cooperative. Podiatry has requested vascular eval and pt is due to get angiogram today as her pulses are weak on exam as well. PT consult per CM per family request as well.    Review of Systems: As per HPI otherwise 10 point review of systems negative.   Past Medical History:  Diagnosis Date  . Angina pectoris   . Arthritis   . Chronic anticoagulation    Xarelto  . Chronic diastolic (congestive) heart failure ([place])   . COPD (chronic obstructive pulmonary disease) ([place])   . Coronary artery disease    a. s/p RCA stent 2008. b. cath 02/2009: 30% mLAD, 90% AV groove cx unchanged from prior, 50-60% RCA, EF 60% -> med rx.  . Depression   . Essential hypertension   . Hyperlipidemia   . Mild pulmonary hypertension ([place]) 09/17/2016  . Permanent atrial fibrillation ([place])   . Skin cancer Jan 2013   squamous cell- removed  . Tricuspid regurgitation 09/17/2016    Past Surgical History:  Procedure Laterality Date  .  CATARACTS REMOVED    . ABDOMINAL HYSTERECTOMY    . CARDIAC CATHETERIZATION  03/22/2009   Started medical therapy  . CARDIOVERSION  06/05/2009   3  attempts, last attempt successful  . CORONARY ANGIOPLASTY WITH STENT PLACEMENT  2009   RCA Vision stent  . HIP SURGERY    . MELANOMA EXCISION  06/06/2011   Procedure: MELANOMA EXCISION;  Surgeon: Adin Hector, MD;  Location: [facility];  Service: General;  Laterality: Left;  re excision squamous cell lesion left chest wall      reports that she has quit smoking. She smoked 0.50 packs per day. She has never used smokeless tobacco. She reports that she does not drink alcohol or use drugs.  Allergies  Allergen Reactions  . Codeine Nausea And Vomiting    Family History  Problem Relation Age of Onset  . Stroke Mother   . Heart disease Mother   . Coronary artery disease Father   . Heart disease Father   . Stroke Sister   . Heart disease Brother   . Stroke Sister   . Stroke Sister    Prior to Admission medications   Medication Sig Start Date End Date Taking? Authorizing Provider  acitretin (SORIATANE) 25 MG capsule Take 25 mg by mouth daily. 06/30/19  Yes [provider]  ascorbic acid (VITAMIN C) 500 MG tablet Take 500 mg by mouth daily.   Yes [provider]  aspirin EC 81 MG tablet Take 81 mg by mouth daily.    Yes [provider]  atorvastatin (LIPITOR) 40 MG tablet Take 1 tablet (40 mg total) by mouth daily. 07/01/19  Yes [name], MD  cephALEXin (KEFLEX) 250 MG capsule Take 250 mg by mouth 3 (three) times daily.   Yes [provider]  cholecalciferol (VITAMIN D3) 25 MCG (1000 UNIT) tablet Take 1,000 Units by mouth daily.   Yes [provider]  metoprolol succinate (TOPROL-XL) 25 MG 24 hr tablet Take 1 tablet (25 mg total) by mouth daily. 05/05/19  Yes Croitoru, Mihai, MD  Rivaroxaban (XARELTO) 15 MG TABS tablet Take 1 tablet (15 mg total) by mouth daily before supper. Patient taking differently: Take 15 mg by mouth daily after breakfast.  05/05/19  Yes Croitoru, Mihai, MD  vitamin B-12 (CYANOCOBALAMIN) 1000 MCG tablet Take 1,000 mcg  by mouth daily.   Yes [provider]    Physical Exam: Vitals:   08/10/19 0023 08/10/19 0742 08/10/19 0956 08/10/19 1255  BP: (!) 147/90 (!) 156/104 125/74 (!) 161/85  Pulse: 93 (!) 114 84 96  Resp: 18 18 18 17   Temp: 97.7 F (36.5 C) 97.9 F (36.6 C) 98.1 F (36.7 C) 97.9 F (36.6 C)  TempSrc:  Oral  Oral  SpO2: 100% 100% 100% 99%  Weight:      Height:        Constitutional: NAD, calm, comfortable Vitals:   08/10/19 0023 08/10/19 0742 08/10/19 0956 08/10/19 1255  BP: (!) 147/90 (!) 156/104 125/74 (!) 161/85  Pulse: 93 (!) 114 84 96  Resp: 18 18 18 17   Temp: 97.7 F (36.5 C) 97.9 F (36.6 C) 98.1 F (36.7 C) 97.9 F (36.6 C)  TempSrc:  Oral  Oral  SpO2: 100% 100% 100% 99%  Weight:      Height:       Eyes: PERRL, lids and conjunctivae normal ENMT: Mucous membranes are moist. Posterior pharynx clear of any exudate or lesions.Normal dentition.  Neck: normal, supple, no masses, no thyromegaly Respiratory: clear to auscultation bilaterally, no wheezing, no crackles. Normal respiratory effort. No accessory muscle use.  Cardiovascular: Regular rate and rhythm, no murmurs / rubs / gallops. No extremity edema. 2+ pedal pulses. No carotid bruits.  Abdomen: no tenderness, no masses palpated. No hepatosplenomegaly. Bowel sounds positive.  Musculoskeletal: no clubbing / cyanosis. No joint deformity upper and lower extremities. Good ROM, no contractures. Normal muscle tone.  Skin: LLE with cellulitis and wound with central eschar Neurologic: CN 2-12 grossly intact. Sensation intact. Psychiatric: Normal judgment and insight. Alert and confused. Normal mood.   Labs on Admission: I have personally reviewed following labs and imaging studies  CBC: Recent Labs  Lab 08/09/19 1507 08/10/19 0501  WBC 8.8 8.6  NEUTROABS 5.5  --   HGB 11.2* 10.5*  HCT 33.8* 31.9*  MCV 98.3 98.2  PLT 300 [id number]   Basic Metabolic Panel: Recent Labs  Lab 08/09/19 1507 08/10/19 0501  NA  139 140  K 3.9 3.4*  CL 103 104  CO2 29 26  GLUCOSE 121* 101*  BUN 17 19  CREATININE 0.88 0.62  CALCIUM 9.3 8.9   GFR: Estimated Creatinine Clearance: 41 mL/min (by C-G formula based on SCr of 0.62 mg/dL). Liver Function Tests: Recent Labs  Lab 08/09/19 1507 08/10/19 0501  AST 35 37  ALT 18 21  ALKPHOS 70 65  BILITOT 0.7 0.9  PROT 7.3 6.7  ALBUMIN 4.0 3.9   Urine analysis:    Component Value Date/Time   COLORURINE YELLOW 06/03/2019 2234   APPEARANCEUR CLEAR 06/03/2019  2234   LABSPEC 1.006 06/03/2019 2234   PHURINE 8.0 06/03/2019 [place] 06/03/2019 [place] 06/03/2019 [place] 06/03/2019 [place] 06/03/2019 2234   PROTEINUR NEGATIVE 06/03/2019 2234   UROBILINOGEN 0.2 12/28/2012 2123   NITRITE NEGATIVE 06/03/2019 2234   LEUKOCYTESUR SMALL (A) 06/03/2019 2234    Radiological Exams on Admission: MR FOOT LEFT W WO CONTRAST  Result Date: 08/10/2019 CLINICAL DATA:  Dorsal foot ulceration with surrounding erythema and swelling. Cellulitis. EXAM: MRI OF THE LEFT FOREFOOT WITHOUT AND WITH CONTRAST TECHNIQUE: Multiplanar, multisequence MR imaging of the left forefoot was performed both before and after administration of intravenous contrast. CONTRAST:  72mL GADAVIST GADOBUTROL 1 MMOL/ML IV SOLN COMPARISON:  Radiographs 08/09/2019 and 07/20/2019 FINDINGS: Bones/Joint/Cartilage No evidence of acute fracture, dislocation, cortical destruction or marrow edema. Specifically, no evidence of osteomyelitis. Mild degenerative changes are present at the 1st metatarsophalangeal joint and in the midfoot. No significant joint effusions or suspicious synovial enhancement. Ligaments The Lisfranc ligament is intact. The collateral ligaments of the metatarsophalangeal joints appear intact. Muscles and Tendons The forefoot tendons appear intact without significant tenosynovitis or abnormal associated enhancement. No significant muscular  abnormalities. Soft tissues There is focal skin ulceration dorsally over the mid 2nd and 3rd metatarsal shafts. There is an underlying ill-defined superficial fluid collection which is dorsal to the extensor tendons. This fluid collection measures approximately 2.6 x 2.2 x 1.0 cm and demonstrates peripheral enhancement following contrast. There is surrounding soft tissue edema and enhancement without additional focal fluid collection. No definite foreign body or other fluid collection identified in the forefoot. IMPRESSION: 1. Focal skin ulceration dorsally over the mid 2nd and 3rd metatarsal heads with underlying ill-defined superficial fluid collection and surrounding soft tissue edema. Findings are consistent with an abscess. 2. No evidence of osteomyelitis, septic joint or infectious tenosynovitis. 3. Mild degenerative changes in the 1st metatarsophalangeal and midfoot. 4. A preliminary report relating these findings was issued by Dr. Marguerita Merles at 2310 hours on 08/09/2019. Electronically Signed   By: Richardean Sale M.D.   On: 08/10/2019 07:59   DG Foot 2 Views Left  Result Date: 08/09/2019 CLINICAL DATA:  Pain EXAM: LEFT FOOT - 2 VIEW COMPARISON:  07/20/2019 FINDINGS: There is soft tissue swelling about the dorsal aspect of the foot. There is no acute displaced fracture no dislocation. No radiographic evidence for osteomyelitis. IMPRESSION: No acute osseous abnormality. Soft tissue swelling about the dorsal aspect of the foot. Electronically Signed   By: Constance Holster M.D.   On: 08/09/2019 15:55      Assessment/Plan Active Problems:   Cellulitis   Cellulitis of left foot  Cellulitis  Left Lower Extremity Wound: infected LLE wound after fall in late Feb (see 2/24 ED note and images).  Wound now with central eschar with surrounding cellulitis. Plain films with soft tissue swelling Plan for MRI L foot ABI, has palpable DP pulse Ceftriaxone, doxycycline Podiatry and vascular c/s, appreciate  assistance  Atrial Fibrillation: on xarelto, continue beta blocker  CAD: continue aspirin, statin, beta blocker  Hypertension: metoprolol  Hx COPD: no resp distress or wheezing  Hx HFpEF: appears euvolemic  DVT prophylaxis: xarelto  Code Status: full  Family Communication: son  Disposition Plan: SNF. Consults called: Podiatry/ Vascular  Admission status: obs    Para Skeans MD [facility] Pager AMION If 7PM-7AM, please contact night-coverage [email or web address] Use universal [place] password for that web site. If you do  not have the password, please call the hospital operator.  08/10/2019, 1:38 PM

## 2019-08-11 ENCOUNTER — Encounter: Payer: Self-pay | Admitting: Cardiology

## 2019-08-11 DIAGNOSIS — L03116 Cellulitis of left lower limb: Secondary | ICD-10-CM | POA: Diagnosis not present

## 2019-08-11 LAB — CBC WITH DIFFERENTIAL/PLATELET
Abs Immature Granulocytes: 0.04 10*3/uL (ref 0.00–0.07)
Basophils Absolute: 0.1 10*3/uL (ref 0.0–0.1)
Basophils Relative: 1 %
Eosinophils Absolute: 0.1 10*3/uL (ref 0.0–0.5)
Eosinophils Relative: 2 %
HCT: 30.7 % — ABNORMAL LOW (ref 36.0–46.0)
Hemoglobin: 10.3 g/dL — ABNORMAL LOW (ref 12.0–15.0)
Immature Granulocytes: 1 %
Lymphocytes Relative: 16 %
Lymphs Abs: 1.4 10*3/uL (ref 0.7–4.0)
MCH: 32.3 pg (ref 26.0–34.0)
MCHC: 33.6 g/dL (ref 30.0–36.0)
MCV: 96.2 fL (ref 80.0–100.0)
Monocytes Absolute: 0.6 10*3/uL (ref 0.1–1.0)
Monocytes Relative: 8 %
Neutro Abs: 6.2 10*3/uL (ref 1.7–7.7)
Neutrophils Relative %: 72 %
Platelets: 247 10*3/uL (ref 150–400)
RBC: 3.19 MIL/uL — ABNORMAL LOW (ref 3.87–5.11)
RDW: 13 % (ref 11.5–15.5)
WBC: 8.4 10*3/uL (ref 4.0–10.5)
nRBC: 0 % (ref 0.0–0.2)

## 2019-08-11 LAB — COMPREHENSIVE METABOLIC PANEL
ALT: 20 U/L (ref 0–44)
AST: 37 U/L (ref 15–41)
Albumin: 3.6 g/dL (ref 3.5–5.0)
Alkaline Phosphatase: 60 U/L (ref 38–126)
Anion gap: 9 (ref 5–15)
BUN: 15 mg/dL (ref 8–23)
CO2: 26 mmol/L (ref 22–32)
Calcium: 8.4 mg/dL — ABNORMAL LOW (ref 8.9–10.3)
Chloride: 101 mmol/L (ref 98–111)
Creatinine, Ser: 0.82 mg/dL (ref 0.44–1.00)
GFR calc Af Amer: >60 mL/min (ref >60)
GFR calc non Af Amer: >60 mL/min (ref >60)
Glucose, Bld: 132 mg/dL — ABNORMAL HIGH (ref 70–99)
Potassium: 3.4 mmol/L — ABNORMAL LOW (ref 3.5–5.1)
Sodium: 136 mmol/L (ref 135–145)
Total Bilirubin: 0.7 mg/dL (ref 0.3–1.2)
Total Protein: 6.4 g/dL — ABNORMAL LOW (ref 6.5–8.1)

## 2019-08-11 LAB — MAGNESIUM: Magnesium: 1.8 mg/dL (ref 1.7–2.4)

## 2019-08-11 MED ORDER — LORAZEPAM 1 MG PO TABS
1.0000 mg | ORAL_TABLET | Freq: Once | ORAL | Status: AC
  Administered 2019-08-11: 20:00:00 1 mg via ORAL
  Filled 2019-08-11: qty 1

## 2019-08-11 MED ORDER — POTASSIUM CHLORIDE 20 MEQ PO PACK
20.0000 meq | PACK | Freq: Once | ORAL | Status: AC
  Administered 2019-08-11: 20 meq via ORAL
  Filled 2019-08-11: qty 1

## 2019-08-11 NOTE — Progress Notes (Signed)
[facility] Daily Progress Note   Subjective: 1 Day Post-Op: 1. Introduction catheter into left lower extremity 3rd order catheter placement  2. Contrast injection left lower extremity for distal runoff   3. Percutaneous transluminal angioplasty and stent placement left superficial femoral artery and popliteal 4. Percutaneous transluminal angioplasty left anterior tibial artery.             5.  Star close closure right common femoral arteriotomy  Patient without complaint. No issues overnight.   Objective: Vitals:   08/11/19 0010 08/11/19 0101 08/11/19 0254 08/11/19 0752  BP: (!) 147/98 (!) 160/94 (!) 162/110 (!) 152/92  Pulse: 79 88 62 (!) 102  Resp: 17 17 18 17   Temp: 97.8 F (36.6 C) 97.6 F (36.4 C) 97.6 F (36.4 C)   TempSrc:    Oral  SpO2:  100% 99% 100%  Weight:      Height:        Intake/Output Summary (Last 24 hours) at 08/11/2019 1020 Last data filed at 08/10/2019 1825 Gross per 24 hour  Intake 800 ml  Output 150 ml  Net 650 ml   Physical Exam: A&Ox3, NAD CV: RRR Pulmonary: CTA Bilaterally Abdomen: Soft, Nontender, Nondistended Vascular:  Right Groin: Access site - clean, dry, and intact Left lower extremity: Thigh soft.  Calf soft.  Extremities warm distally in toes. Good capillary refill.  Wound stable.   Laboratory: CBC    Component Value Date/Time   WBC 8.6 08/10/2019 0501   HGB 10.5 (L) 08/10/2019 0501   HCT 31.9 (L) 08/10/2019 0501   PLT 275 08/10/2019 0501   BMET    Component Value Date/Time   NA 140 08/10/2019 0501   NA 139 07/30/2017 1102   K 3.4 (L) 08/10/2019 0501   CL 104 08/10/2019 0501   CO2 26 08/10/2019 0501   GLUCOSE 101 (H) 08/10/2019 0501   BUN 19 08/10/2019 0501   BUN 7 (L) 07/30/2017 1102   CREATININE 0.62 08/10/2019 0501   CREATININE 0.87 07/11/2013 0918   CALCIUM 8.9 08/10/2019 0501   GFRNONAA >60 08/10/2019 0501   GFRAA >60 08/10/2019 0501    Assessment/Planning: The patient is an 86 year old female who presented to the [facility] emergency department with a chronic left foot wound status post left lower extremity angiogram with intervention - POD#1 1) successful revascularization of the left lower extremity 2) On aspirin and Xarelto for medical management.  Patient is also on a statin 3) Will continue to follow in our outpatient setting.  Discussed with Dr. Eber Hong Shawnte Demarest PA-C 08/11/2019 10:20 AM

## 2019-08-11 NOTE — Care Management Obs Status (Signed)
[place] NOTIFICATION   Patient Details  Name: Tiffany Charles MRN: [id number] Date of Birth: 1930-05-20   Medicare Observation Status Notification Given:  Yes    Shelbie Hutching, RN 08/11/2019, 9:46 AM

## 2019-08-11 NOTE — Evaluation (Signed)
Physical Therapy Evaluation Patient Details Name: Tiffany Charles MRN: [id number] DOB: [birth year] Today's Date: 08/11/2019   History of Present Illness  Pt is an 86 y.o. female presenting to hospital with worsening redness, swelling, and discomfort to L foot wound that was sustained February 24th.  Pt admitted with L LE cellulitis/wound.  S/p 3/17 L LE revascularization for limb salvage.  PMH includes angina pectoris, HF, COPD, CAD, a-fib, occipital stroke, dementia, and h/o hip surgery.  Clinical Impression  Prior to hospital admission, pt was ambulatory.  Currently pt is modified independent with bed mobility; CGA with transfers; and CGA with ambulating 200 feet (no AD).  No loss of balance noted during sessions activities.  Pt reporting mild soreness L foot during session.  Pt would benefit from skilled PT to address noted impairments and functional limitations during hospital stay (see below for any additional details).  Upon hospital discharge, anticipate no further PT needs.    Follow Up Recommendations No PT follow up    Equipment Recommendations  None recommended by PT    Recommendations for Other Services       Precautions / Restrictions Precautions Precautions: Fall Restrictions Weight Bearing Restrictions: No      Mobility  Bed Mobility Overal bed mobility: Modified Independent Bed Mobility: Supine to Sit;Sit to Supine     Supine to sit: Modified independent (Device/Increase time);HOB elevated Sit to supine: Modified independent (Device/Increase time);HOB elevated   General bed mobility comments: No assist required  Transfers Overall transfer level: Needs assistance Equipment used: Rolling walker (2 wheeled) Transfers: Sit to/from Stand Sit to Stand: Min guard         General transfer comment: fairly strong stand from bed; steady controlled descent sitting back onto bed  Ambulation/Gait Ambulation/Gait assistance: Min guard Gait Distance (Feet): 200  Feet Assistive device: None   Gait velocity: mildly decreased   General Gait Details: step through gait pattern; steady  Stairs            Wheelchair Mobility    Modified Rankin (Stroke Patients Only)       Balance Overall balance assessment: Needs assistance;History of Falls Sitting-balance support: No upper extremity supported;Feet supported Sitting balance-Leahy Scale: Normal Sitting balance - Comments: steady sitting reaching outside BOS   Standing balance support: No upper extremity supported;During functional activity Standing balance-Leahy Scale: Good Standing balance comment: no loss of balance noted with ambulation/functional activities                             Pertinent Vitals/Pain Pain Assessment: Faces Faces Pain Scale: Hurts a little bit Pain Location: L foot Pain Descriptors / Indicators: Sore Pain Intervention(s): Limited activity within patient's tolerance;Monitored during session;Repositioned  Vitals (HR and O2 on room air) stable and WFL throughout treatment session.    Home Living                   Additional Comments: Pt had difficulty verbalizing home set-up.  Per chart, pt lives with her son.    Prior Function           Comments: Per chart review, pt walks without an assistive device     Hand Dominance   Dominant Hand: Right    Extremity/Trunk Assessment   Upper Extremity Assessment Upper Extremity Assessment: Generalized weakness    Lower Extremity Assessment Lower Extremity Assessment: Generalized weakness    Cervical / Trunk Assessment Cervical / Trunk Assessment: Kyphotic  Communication  Communication: No difficulties  Cognition Arousal/Alertness: Awake/alert Behavior During Therapy: WFL for tasks assessed/performed Overall Cognitive Status: No family/caregiver present to determine baseline cognitive functioning                                 General Comments: Oriented to  person and month      General Comments   Nursing cleared pt for participation in physical therapy.  Pt agreeable to PT session.    Exercises     Assessment/Plan    PT Assessment Patent does not need any further PT services  PT Problem List Decreased strength;Decreased mobility       PT Treatment Interventions DME instruction;Gait training;Stair training;Functional mobility training;Therapeutic activities;Therapeutic exercise;Balance training;Patient/family education    PT Goals (Current goals can be found in the Care Plan section)  Acute Rehab PT Goals Patient Stated Goal: to walk more PT Goal Formulation: With patient Time For Goal Achievement: 08/25/19 Potential to Achieve Goals: Good    Frequency Min 2X/week   Barriers to discharge        Co-evaluation               AM-PAC PT "6 Clicks" Mobility  Outcome Measure Help needed turning from your back to your side while in a flat bed without using bedrails?: None Help needed moving from lying on your back to sitting on the side of a flat bed without using bedrails?: None Help needed moving to and from a bed to a chair (including a wheelchair)?: A Little Help needed standing up from a chair using your arms (e.g., wheelchair or bedside chair)?: A Little Help needed to walk in hospital room?: A Little Help needed climbing 3-5 steps with a railing? : A Little 6 Click Score: 20    End of Session Equipment Utilized During Treatment: Gait belt Activity Tolerance: Patient tolerated treatment well Patient left: in bed;with call bell/phone within reach;with bed alarm set Nurse Communication: Mobility status;Precautions PT Visit Diagnosis: Muscle weakness (generalized) (M62.81)    Time: 1010-1035 PT Time Calculation (min) (ACUTE ONLY): 25 min   Charges:   PT Evaluation $PT Eval Low Complexity: 1 Low PT Treatments $Therapeutic Exercise: 8-22 mins       Leitha Bleak, PT 08/11/19, 2:36 PM

## 2019-08-11 NOTE — H&P (Signed)
History and Physical    Tiffany Charles [id number] DOB: [birth year] DOA: 08/09/2019  PCP: Tiffany Lima, Charles  Patient coming from: home   Chief Complaint: left foot wound and cellulitis  HPI: Tiffany Charles is a 86 y.o. female with medical history significant of afib on xarelto, HFpEF, COPD, CAD, HTN and multiple other medical problems presenting with a left foot wound.  Patient fell and had injury to left foot in late February.  Since then, her son has been trying to care for it, but it's gotten progressively worse.  It was seen by family friends who are doctors who recommended being seen in the hospital.  The patient denies pain, fevers, chills.  She can't tell me clearly why she's here.  The history was obtained from chart review and the son.  08/10/2019: Pt seen today is alert awake and doing well. Pt is confused but cooperative. Podiatry has requested vascular eval and pt is due to get angiogram today as her pulses are weak on exam as well. PT consult per CM per family request as well.   3/18 Pt seen today she is comfortable and is doing well post pci  And plan is for local wound care with outpatient follow up with podiatrist, pt is also to continue meds for her pci. Mild hypokalemia -replaced.  magnesium is 1.8 we will replace.Pt hemoglobin has dropped which is suspect is from her procedure and also phlebotomy.creatinine is stable at 0.82.  Review of Systems: As per HPI otherwise 10 point review of systems negative.   Past Medical History:  Diagnosis Date  . Angina pectoris   . Arthritis   . Chronic anticoagulation    Xarelto  . Chronic diastolic (congestive) heart failure ([place])   . COPD (chronic obstructive pulmonary disease) ([place])   . Coronary artery disease    a. s/p RCA stent 2008. b. cath 02/2009: 30% mLAD, 90% AV groove cx unchanged from prior, 50-60% RCA, EF 60% -> med rx.  . Depression   . Essential hypertension   . Hyperlipidemia   . Mild pulmonary hypertension  ([place]) 09/17/2016  . Permanent atrial fibrillation ([place])   . Skin cancer Jan 2013   squamous cell- removed  . Tricuspid regurgitation 09/17/2016    Past Surgical History:  Procedure Laterality Date  .  CATARACTS REMOVED    . ABDOMINAL HYSTERECTOMY    . CARDIAC CATHETERIZATION  03/22/2009   Started medical therapy  . CARDIOVERSION  06/05/2009   3 attempts, last attempt successful  . CORONARY ANGIOPLASTY WITH STENT PLACEMENT  2009   RCA Vision stent  . HIP SURGERY    . LOWER EXTREMITY ANGIOGRAPHY Left 08/10/2019   Procedure: Lower Extremity Angiography;  Surgeon: Tiffany Charles;  Location: [facility];  Service: Cardiovascular;  Laterality: Left;  Marland Kitchen MELANOMA EXCISION  06/06/2011   Procedure: MELANOMA EXCISION;  Surgeon: Tiffany Charles;  Location: [facility];  Service: General;  Laterality: Left;  re excision squamous cell lesion left chest wall      reports that she has quit smoking. She smoked 0.50 packs per day. She has never used smokeless tobacco. She reports that she does not drink alcohol or use drugs.  Allergies  Allergen Reactions  . Codeine Nausea And Vomiting    Family History  Problem Relation Age of Onset  . Stroke Mother   . Heart disease Mother   . Coronary artery disease Father   . Heart disease Father   .  Stroke Sister   . Heart disease Brother   . Stroke Sister   . Stroke Sister    Prior to Admission medications   Medication Sig Start Date End Date Taking? Authorizing Provider  acitretin (SORIATANE) 25 MG capsule Take 25 mg by mouth daily. 06/30/19  Yes Provider, Historical, Charles  ascorbic acid (VITAMIN C) 500 MG tablet Take 500 mg by mouth daily.   Yes Provider, Historical, Charles  aspirin EC 81 MG tablet Take 81 mg by mouth daily.    Yes Provider, Historical, Charles  atorvastatin (LIPITOR) 40 MG tablet Take 1 tablet (40 mg total) by mouth daily. 07/01/19  Yes Tiffany Lima, Charles  cephALEXin (KEFLEX) 250 MG capsule Take 250 mg by mouth 3 (three) times  daily.   Yes Provider, Historical, Charles  cholecalciferol (VITAMIN D3) 25 MCG (1000 UNIT) tablet Take 1,000 Units by mouth daily.   Yes Provider, Historical, Charles  metoprolol succinate (TOPROL-XL) 25 MG 24 hr tablet Take 1 tablet (25 mg total) by mouth daily. 05/05/19  Yes Tiffany Charles  Rivaroxaban (XARELTO) 15 MG TABS tablet Take 1 tablet (15 mg total) by mouth daily before supper. Patient taking differently: Take 15 mg by mouth daily after breakfast.  05/05/19  Yes Tiffany Charles  vitamin B-12 (CYANOCOBALAMIN) 1000 MCG tablet Take 1,000 mcg by mouth daily.   Yes Provider, Historical, Charles    Physical Exam: Vitals:   08/11/19 0101 08/11/19 0254 08/11/19 0752 08/11/19 1512  BP: (!) 160/94 (!) 162/110 (!) 152/92 138/73  Pulse: 88 62 (!) 102 93  Resp: 17 18 17 18   Temp: 97.6 F (36.4 C) 97.6 F (36.4 C)  98.6 F (37 C)  TempSrc:   Oral   SpO2: 100% 99% 100% 100%  Weight:      Height:        Constitutional: NAD, calm, comfortable Vitals:   08/11/19 0101 08/11/19 0254 08/11/19 0752 08/11/19 1512  BP: (!) 160/94 (!) 162/110 (!) 152/92 138/73  Pulse: 88 62 (!) 102 93  Resp: 17 18 17 18   Temp: 97.6 F (36.4 C) 97.6 F (36.4 C)  98.6 F (37 C)  TempSrc:   Oral   SpO2: 100% 99% 100% 100%  Weight:      Height:       Eyes: PERRL, lids and conjunctivae normal ENMT: Mucous membranes are moist. Posterior pharynx clear of any exudate or lesions.Normal dentition.  Neck: normal, supple, no masses, no thyromegaly Respiratory: clear to auscultation bilaterally, no wheezing, no crackles. Normal respiratory effort. No accessory muscle use.  Cardiovascular:Irregular rate and rhythm, no murmurs / rubs / gallops. No extremity edema. Pedal pulses 1+, Abdomen: no tenderness, no masses palpated. No hepatosplenomegaly. Bowel sounds positive.  Musculoskeletal: no clubbing / cyanosis. No joint deformity upper and lower extremities. Good ROM, no contractures. Normal muscle tone.  Skin: LLE with  cellulitis and wound with central eschar Neurologic: CN 2-12 grossly intact. Sensation intact. Psychiatric: Normal judgment and insight. Alert and confused. Normal mood.   Labs on Admission: I have personally reviewed following labs and imaging studies  CBC: Recent Labs  Lab 08/09/19 1507 08/10/19 0501 08/11/19 1006  WBC 8.8 8.6 8.4  NEUTROABS 5.5  --  6.2  HGB 11.2* 10.5* 10.3*  HCT 33.8* 31.9* 30.7*  MCV 98.3 98.2 96.2  PLT 300 275 [id number]   Basic Metabolic Panel: Recent Labs  Lab 08/09/19 1507 08/10/19 0501 08/11/19 1006  NA 139 140 136  K 3.9 3.4* 3.4*  CL  103 104 101  CO2 29 26 26   GLUCOSE 121* 101* 132*  BUN 17 19 15   CREATININE 0.88 0.62 0.82  CALCIUM 9.3 8.9 8.4*  MG  --   --  1.8   GFR: Estimated Creatinine Clearance: 40 mL/min (by C-G formula based on SCr of 0.82 mg/dL). Liver Function Tests: Recent Labs  Lab 08/09/19 1507 08/10/19 0501 08/11/19 1006  AST 35 37 37  ALT 18 21 20   ALKPHOS 70 65 60  BILITOT 0.7 0.9 0.7  PROT 7.3 6.7 6.4*  ALBUMIN 4.0 3.9 3.6   Urine analysis:    Component Value Date/Time   COLORURINE YELLOW 06/03/2019 2234   [place] 06/03/2019 2234   LABSPEC 1.006 06/03/2019 2234   PHURINE 8.0 06/03/2019 [place] 06/03/2019 2234   HGBUR NEGATIVE 06/03/2019 2234   BILIRUBINUR NEGATIVE 06/03/2019 2234   KETONESUR NEGATIVE 06/03/2019 2234   PROTEINUR NEGATIVE 06/03/2019 2234   UROBILINOGEN 0.2 12/28/2012 2123   NITRITE NEGATIVE 06/03/2019 2234   LEUKOCYTESUR SMALL (A) 06/03/2019 2234    Radiological Exams on Admission: MR FOOT LEFT W WO CONTRAST  Result Date: 08/10/2019 CLINICAL DATA:  Dorsal foot ulceration with surrounding erythema and swelling. Cellulitis. EXAM: MRI OF THE LEFT FOREFOOT WITHOUT AND WITH CONTRAST TECHNIQUE: Multiplanar, multisequence MR imaging of the left forefoot was performed both before and after administration of intravenous contrast. CONTRAST:  66mL GADAVIST GADOBUTROL 1 MMOL/ML  IV SOLN COMPARISON:  Radiographs 08/09/2019 and 07/20/2019 FINDINGS: Bones/Joint/Cartilage No evidence of acute fracture, dislocation, cortical destruction or marrow edema. Specifically, no evidence of osteomyelitis. Mild degenerative changes are present at the 1st metatarsophalangeal joint and in the midfoot. No significant joint effusions or suspicious synovial enhancement. Ligaments The Lisfranc ligament is intact. The collateral ligaments of the metatarsophalangeal joints appear intact. Muscles and Tendons The forefoot tendons appear intact without significant tenosynovitis or abnormal associated enhancement. No significant muscular abnormalities. Soft tissues There is focal skin ulceration dorsally over the mid 2nd and 3rd metatarsal shafts. There is an underlying ill-defined superficial fluid collection which is dorsal to the extensor tendons. This fluid collection measures approximately 2.6 x 2.2 x 1.0 cm and demonstrates peripheral enhancement following contrast. There is surrounding soft tissue edema and enhancement without additional focal fluid collection. No definite foreign body or other fluid collection identified in the forefoot. IMPRESSION: 1. Focal skin ulceration dorsally over the mid 2nd and 3rd metatarsal heads with underlying ill-defined superficial fluid collection and surrounding soft tissue edema. Findings are consistent with an abscess. 2. No evidence of osteomyelitis, septic joint or infectious tenosynovitis. 3. Mild degenerative changes in the 1st metatarsophalangeal and midfoot. 4. A preliminary report relating these findings was issued by Dr. Marguerita Merles at 2310 hours on 08/09/2019. Electronically Signed   By: Richardean Sale M.D.   On: 08/10/2019 07:59   PERIPHERAL VASCULAR CATHETERIZATION  Result Date: 08/10/2019 See op note   Assessment/Plan Active Problems:   Cellulitis   Cellulitis of left foot  Cellulitis  Left Lower Extremity Wound:  Infected LLE wound after fall in late  Feb (see 2/24 ED note and images).  Wound now with central eschar with surrounding cellulitis. Plain films with soft tissue swelling Plan for MRI L foot ABI, has palpable DP pulse Ceftriaxone, doxycycline Podiatry and vascular c/s, appreciate assistance  Atrial Fibrillation: on xarelto, continue beta blocker  Anemia: Mild due to procedure and phlebotomy  CAD: continue aspirin, statin, beta blocker  Hypertension: metoprolol  Hx COPD: no resp distress or wheezing  Hx HFpEF: appears euvolemic  DVT prophylaxis: xarelto  Code Status: full  Family Communication: son  Disposition Plan: SNF. Consults called: Podiatry/ Vascular  Admission status: obs    Para Skeans Charles [facility] Pager AMION If 7PM-7AM, please contact night-coverage [email or web address] Use universal [place] password for that web site. If you do not have the password, please call the hospital operator.  08/11/2019, 4:39 PM

## 2019-08-11 NOTE — TOC Progression Note (Signed)
Transition of Care [facility]) - Progression Note    Patient Details  Name: Tiffany Charles MRN: [id number] Date of Birth: May 12, 1930  [facility] of [facility]) CM/SW Contact  Shelbie Hutching, RN Phone Number: 08/11/2019, 3:30 PM  Clinical Narrative:    [name] has decided on [facility] for skilled nursing rehab.  Facility is starting [employer] authorization.  Tiffany Charles is going to the facility in the morning to sign paperwork.    Expected Discharge Plan: [place] Barriers to Discharge: Continued Medical Work up  Expected Discharge Plan and Services Expected Discharge Plan: [place]   Discharge Planning Services: CM Consult Post Acute Care Choice: [place] Living arrangements for the past 2 months: Single Family Home                                       Social Determinants of Health (SDOH) Interventions    Readmission Risk Interventions No flowsheet data found.

## 2019-08-11 NOTE — Progress Notes (Signed)
Noted order for one:one sitter. Staffing unavailable and bedside sitter sent to ER per [facility]. Tele sitter placed and on. Unit Aide sitting immediately outside room.

## 2019-08-11 NOTE — TOC Initial Note (Signed)
Transition of Care [facility]) - Initial/Assessment Note    Patient Details  Name: Tiffany Charles MRN: [id number] Date of Birth: Oct 26, 1929  Transition of Care [facility]) CM/SW Contact:    Shelbie Hutching, RN Phone Number: 08/11/2019, 9:27 AM  Clinical Narrative:                 Patient is under observation for cellulitis of left foot.  Patient is from home where she lives with her son, [name], in [place].  [name] reports that the patient really needs short term rehab, he feels he cannot care for the wound on her foot adequately.  PT consult is pending.  Patient was recently at [facility] but [name] took her out after just a couple of days because she was wanted to go home.  Bed search has been started for short term rehab, [name] would like for the patient to go to [facility] if possible.   Expected Discharge Plan: Skilled Nursing Facility Barriers to Discharge: Continued Medical Work up   Patient Goals and CMS Choice Patient states their goals for this hospitalization and ongoing recovery are:: Son would like for patient to go to rehab for wound care CMS Medicare.gov Compare Post Acute Care list provided to:: Patient Represenative (must comment) Choice offered to / list presented to : Adult Children(son Don)  Expected Discharge Plan and Services Expected Discharge Plan: [place]   Discharge Planning Services: CM Consult Post Acute Care Choice: [place] Living arrangements for the past 2 months: Single Family Home                                      Prior Living Arrangements/Services Living arrangements for the past 2 months: Single Family Home Lives with:: Adult Children Patient language and need for interpreter reviewed:: Yes Do you feel safe going back to the place where you live?: Yes      Need for Family Participation in Patient Care: Yes (Comment)(foot wound, cellulitis) Care giver support system in place?: Yes (comment)(son [name])    Criminal Activity/Legal Involvement Pertinent to Current Situation/Hospitalization: No - Comment as needed  Activities of Daily Living      Permission Sought/Granted Permission sought to share information with : Case Manager, Customer service manager, Family Supports Permission granted to share information with : Yes, Verbal Permission Granted  Share Information with NAME: [name]  Permission granted to share info w AGENCY: [facility]  Permission granted to share info w Relationship: Son     Emotional Assessment Appearance:: Appears stated age Attitude/Demeanor/Rapport: Engaged Affect (typically observed): Accepting Orientation: : Oriented to Self Alcohol / Substance Use: Not Applicable Psych Involvement: No (comment)  Admission diagnosis:  Cellulitis [L03.90] Cellulitis of left foot [L03.116] Patient Active Problem List   Diagnosis Date Noted  . Cellulitis 08/09/2019  . Cellulitis of left foot   . Left foot pain 07/22/2019  . Open wound of left foot 07/22/2019  . DNR (do not resuscitate) 06/08/2019  . Occipital stroke ([place]) 06/07/2019  . Squamous cell carcinoma, leg, unspecified laterality 06/08/2018  . Basal cell carcinoma (BCC) of nasal tip 06/08/2018  . Senile dementia without behavioral disturbance ([place]) 04/29/2017  . Frequent PVCs 09/17/2016  . Tricuspid regurgitation 09/17/2016  . Mild pulmonary hypertension ([place]) 09/17/2016  . Chronic diastolic (congestive) heart failure ([place])   . Essential hypertension   . Generalized OA 12/28/2014  . Routine general medical examination  at a health care facility 11/09/2013  . Depression, major ([place]) 11/07/2013  . Chronic anticoagulation- Xarelto 12/28/2012  . Permanent atrial fibrillation 10/28/2012  . COPD - PFTs in June 2014 with MET test 10/28/2012  . CAD, RCA BMS '09, 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 low risk Myoview Feb 2013 10/28/2012  . Hyperlipidemia 10/28/2012   PCP:  [name],  MD Pharmacy:   [facility] [address] [place]), [place] - [address] [place] [address] Phone: [phone] Fax: [phone]  [facility] - [place], [place] [address] Phone: [phone] Fax: [phone]     Social Determinants of Health (SDOH) Interventions    Readmission Risk Interventions No flowsheet data found.

## 2019-08-12 DIAGNOSIS — L03116 Cellulitis of left lower limb: Secondary | ICD-10-CM | POA: Diagnosis not present

## 2019-08-12 LAB — COMPREHENSIVE METABOLIC PANEL
ALT: 15 U/L (ref 0–44)
AST: 32 U/L (ref 15–41)
Albumin: 3.4 g/dL — ABNORMAL LOW (ref 3.5–5.0)
Alkaline Phosphatase: 59 U/L (ref 38–126)
Anion gap: 9 (ref 5–15)
BUN: 21 mg/dL (ref 8–23)
CO2: 25 mmol/L (ref 22–32)
Calcium: 8.5 mg/dL — ABNORMAL LOW (ref 8.9–10.3)
Chloride: 105 mmol/L (ref 98–111)
Creatinine, Ser: 0.76 mg/dL (ref 0.44–1.00)
GFR calc Af Amer: >60 mL/min (ref >60)
GFR calc non Af Amer: >60 mL/min (ref >60)
Glucose, Bld: 94 mg/dL (ref 70–99)
Potassium: 3.4 mmol/L — ABNORMAL LOW (ref 3.5–5.1)
Sodium: 139 mmol/L (ref 135–145)
Total Bilirubin: 0.7 mg/dL (ref 0.3–1.2)
Total Protein: 6 g/dL — ABNORMAL LOW (ref 6.5–8.1)

## 2019-08-12 LAB — URINALYSIS, COMPLETE (UACMP) WITH MICROSCOPIC
Bacteria, UA: NONE SEEN
Bilirubin Urine: NEGATIVE
Glucose, UA: NEGATIVE mg/dL
Hgb urine dipstick: NEGATIVE
Ketones, ur: NEGATIVE mg/dL
Nitrite: NEGATIVE
Protein, ur: NEGATIVE mg/dL
Specific Gravity, Urine: 1.012 (ref 1.005–1.030)
pH: 6 (ref 5.0–8.0)

## 2019-08-12 LAB — CBC WITH DIFFERENTIAL/PLATELET
Abs Immature Granulocytes: 0.03 10*3/uL (ref 0.00–0.07)
Basophils Absolute: 0.1 10*3/uL (ref 0.0–0.1)
Basophils Relative: 1 %
Eosinophils Absolute: 0.2 10*3/uL (ref 0.0–0.5)
Eosinophils Relative: 2 %
HCT: 31.3 % — ABNORMAL LOW (ref 36.0–46.0)
Hemoglobin: 10.4 g/dL — ABNORMAL LOW (ref 12.0–15.0)
Immature Granulocytes: 0 %
Lymphocytes Relative: 23 %
Lymphs Abs: 2.2 10*3/uL (ref 0.7–4.0)
MCH: 32.4 pg (ref 26.0–34.0)
MCHC: 33.2 g/dL (ref 30.0–36.0)
MCV: 97.5 fL (ref 80.0–100.0)
Monocytes Absolute: 1.1 10*3/uL — ABNORMAL HIGH (ref 0.1–1.0)
Monocytes Relative: 11 %
Neutro Abs: 6.3 10*3/uL (ref 1.7–7.7)
Neutrophils Relative %: 63 %
Platelets: 245 10*3/uL (ref 150–400)
RBC: 3.21 MIL/uL — ABNORMAL LOW (ref 3.87–5.11)
RDW: 13 % (ref 11.5–15.5)
WBC: 9.8 10*3/uL (ref 4.0–10.5)
nRBC: 0 % (ref 0.0–0.2)

## 2019-08-12 LAB — RESPIRATORY PANEL BY RT PCR (FLU A&B, COVID)
Influenza A by PCR: NEGATIVE
Influenza B by PCR: NEGATIVE
SARS Coronavirus 2 by RT PCR: NEGATIVE

## 2019-08-12 LAB — SARS CORONAVIRUS 2 (TAT 6-24 HRS): SARS Coronavirus 2: NEGATIVE

## 2019-08-12 MED ORDER — DOXYCYCLINE MONOHYDRATE 100 MG PO CAPS: 100.0000 mg | ORAL_CAPSULE | Freq: Two times a day (BID) | ORAL | 0 refills | Status: AC

## 2019-08-12 MED ORDER — CEPHALEXIN 250 MG PO CAPS: 250.0000 mg | ORAL_CAPSULE | Freq: Three times a day (TID) | ORAL | 0 refills | Status: DC

## 2019-08-12 MED ORDER — QUETIAPINE FUMARATE 25 MG PO TABS
25.0000 mg | ORAL_TABLET | Freq: Once | ORAL | Status: AC
  Administered 2019-08-12: 02:00:00 25 mg via ORAL
  Filled 2019-08-12: qty 1

## 2019-08-12 MED ORDER — CEPHALEXIN 500 MG PO CAPS: 500.0000 mg | ORAL_CAPSULE | Freq: Two times a day (BID) | ORAL | 0 refills | Status: AC

## 2019-08-12 MED ORDER — POTASSIUM CHLORIDE 10 MEQ/100ML IV SOLN
10.0000 meq | Freq: Once | INTRAVENOUS | Status: AC
  Administered 2019-08-12: 11:00:00 10 meq via INTRAVENOUS
  Filled 2019-08-12: qty 100

## 2019-08-12 NOTE — TOC Progression Note (Signed)
Transition of Care [facility]) - Progression Note    Patient Details  Name: Tiffany Charles MRN: [id number] Date of Birth: 1929-06-23  Transition of Care [facility]) CM/SW Contact  Su Hilt, RN Phone Number: 08/12/2019, 8:46 AM  Clinical Narrative:     [place] called back and stated that they do not manage the patient, I called [facility] and spoke with admission to inquire if they started auth and when, She stated that she was notified that they needed to start auth yesterday late by the CM and she did in fact start auth with [facility], she expects to hear back from [facility] around 1 PM today, the patient's son was to go to fill out the paper work this morning at 7 AM, he did not show up and when they called him he had overslept,  He is now to go to the facility at 10 AM to fill out the paper work, She will call me once she hears back from [place] about the auth   Expected Discharge Plan: Skilled Nursing Facility Barriers to Discharge: Continued Medical Work up  Expected Discharge Plan and Services Expected Discharge Plan: [place]   Discharge Planning Services: CM Consult Post Acute Care Choice: [place] Living arrangements for the past 2 months: Single Family Home                                       Social Determinants of Health (SDOH) Interventions    Readmission Risk Interventions No flowsheet data found.

## 2019-08-12 NOTE — Discharge Summary (Signed)
Physician Discharge Summary  Tiffany Charles [id number] DOB: 1929/07/21 DOA: 08/09/2019  PCP: [name], MD  Admit date: 08/09/2019 Discharge date: 08/12/2019  Time spent: 51minutes  Discharge Diagnoses:  Active Problems:   Cellulitis   Cellulitis of left foot  Cellulitis  Left Lower Extremity Wound:  Infected LLE wound after fall in late Feb (see 2/24 ED note and images).  Wound now with central eschar with surrounding cellulitis. Plain films with soft tissue swelling/  MRI L foot showing abscess no OM. ABI, has palpable DP pulse keflex, doxycycline 100 mg bid x 5 days. Podiatry and vascular c/s, appreciate assistance  Atrial Fibrillation: on xarelto, continue beta blocker  Anemia: Stable / Mild due to procedure and phlebotomy  CAD: continue aspirin, statin, beta blocker  Hypertension: metoprolol  Hx COPD: no resp distress or wheezing  Hx HFpEF: appears euvolemic  Discharge Condition: Stable   Diet recommendation: Cardiac Diet .  Filed Weights   08/09/19 1501  Weight: 54.4 kg    History of present illness:   HPI: Tiffany Charles is a 86 y.o. female with medical history significant of afib on xarelto, HFpEF, COPD, CAD, HTN and multiple other medical problems presenting with a left foot wound.  Patient fell and had injury to left foot in late February.  Since then, her son has been trying to care for it, but it's gotten progressively worse.  It was seen by family friends who are doctors who recommended being seen in the hospital.  The patient denies pain, fevers, chills.  She can't tell me clearly why she's here.  The history was obtained from chart review and the son.MRI shows abscess but no osteomyelitis.  ED Course: Labs, imaging, abx.  Admit to hospitalist.  Hospital Course:  Pt was admitted for cellulitis of left lower extremity on 08/09/19 and podiatry was consulted along with vascular and pt was seen by vascular and found to have arterial  blockage by angiogram and has recanalization. Pt has been stable throughout stay and pt has tolerated the procedure well and her kidney function has remained stable.  Patient does have dementia and is a poor historian. She also denied any chest pain or sob and physical therapy and occupational therapy. Pt has been on iv abx since admission and we will continue for additional 10 days and follow up with podiatry.  Admission labs have been stable and hemoglobin has been stable as is her kidney function and liver function.  D/W son today about her baseline mental status  , per son she has dementia and gets agitated intermittently  At home as well.  Procedures: LE angiogram 08/11/19 1. Introduction catheter intoleftlower extremity 3rd order catheter placement.  2.Contrast injection leftlower extremity for distal runoff.  3. Percutaneous transluminal angioplasty and stent placementleftsuperficial femoral artery and popliteal. 4. Percutaneous transluminal angioplasty left anterior tibial artery. 5.Star close closurerightcommon femoral arteriotomy.  Consultations: Vascular surgery-Dr.Schneir.   Discharge Exam: Vitals:   08/12/19 0041 08/12/19 1057  BP: (!) 135/95 (!) 152/84  Pulse: 81 90  Resp: 18 20  Temp: 98 F (36.7 C) 97.9 F (36.6 C)  SpO2: 97% 100%   Eyes: PERRL, lids and conjunctivae normal ENMT: Mucous membranes are moist. Posterior pharynx clear of any exudate or lesions.Normal dentition.  Neck: normal, supple, no masses, no thyromegaly Respiratory: clear to auscultation bilaterally, no wheezing, no crackles. Normal respiratory effort. No accessory muscle use.  Cardiovascular: Regular rate and rhythm, no murmurs / rubs / gallops. No extremity  edema. 1+ pedal pulses. No carotid bruits.  Abdomen: no tenderness, no masses palpated. No hepatosplenomegaly. Bowel sounds positive.  Musculoskeletal: no clubbing / cyanosis. No  joint deformity upper and lower extremities. Good ROM, no contractures. Normal muscle tone.  Skin: LLE with cellulitis and wound dressed . Neurologic: CN 2-12 grossly intact.  Psychiatric: Alert / Awake and cooperative.    Discharge Instructions   Discharge Instructions    Call MD for:  difficulty breathing, headache or visual disturbances   Complete by: As directed    Call MD for:  extreme fatigue   Complete by: As directed    Call MD for:  persistant dizziness or light-headedness   Complete by: As directed    Call MD for:  persistant nausea and vomiting   Complete by: As directed    Call MD for:  severe uncontrolled pain   Complete by: As directed    Call MD for:  temperature >100.4   Complete by: As directed    Diet - low sodium heart healthy   Complete by: As directed    Diet - low sodium heart healthy   Complete by: As directed    Discharge instructions   Complete by: As directed    Pt to make hospital followup appt.   Increase activity slowly   Complete by: As directed    Increase activity slowly   Complete by: As directed      Allergies as of 08/12/2019      Reactions   Codeine Nausea And Vomiting      Medication List    TAKE these medications   acitretin 25 MG capsule Commonly known as: SORIATANE Take 25 mg by mouth daily.   ascorbic acid 500 MG tablet Commonly known as: VITAMIN C Take 500 mg by mouth daily.   aspirin EC 81 MG tablet Take 81 mg by mouth daily.   atorvastatin 40 MG tablet Commonly known as: LIPITOR Take 1 tablet (40 mg total) by mouth daily.   cephALEXin 500 MG capsule Commonly known as: KEFLEX Take 1 capsule (500 mg total) by mouth 2 (two) times daily for 10 days. What changed:   medication strength  how much to take  when to take this   cholecalciferol 25 MCG (1000 UNIT) tablet Commonly known as: VITAMIN D3 Take 1,000 Units by mouth daily.   doxycycline 100 MG capsule Commonly known as: MONODOX Take 1 capsule (100 mg  total) by mouth 2 (two) times daily for 5 days.   metoprolol succinate 25 MG 24 hr tablet Commonly known as: TOPROL-XL Take 1 tablet (25 mg total) by mouth daily.   Rivaroxaban 15 MG Tabs tablet Commonly known as: XARELTO Take 1 tablet (15 mg total) by mouth daily before supper. What changed: when to take this   vitamin B-12 1000 MCG tablet Commonly known as: CYANOCOBALAMIN Take 1,000 mcg by mouth daily.      Allergies  Allergen Reactions  . Codeine Nausea And Vomiting    Contact information for follow-up providers    Schnier, Dolores Lory, MD. Go on 08/25/2019.   Specialties: Vascular Surgery, Cardiology, Radiology, Vascular Surgery Why: @1 :00 PM  Contact information: [place] [address] [phone]            Contact information for after-discharge care    Destination    [facility] Preferred SNF.   Service: Skilled Chiropodist information: [place] [phone]  The results of significant diagnostics from this hospitalization (including imaging, microbiology, ancillary and laboratory) are listed below for reference.    Significant Diagnostic Studies: MR FOOT LEFT W WO CONTRAST  Result Date: 08/10/2019 CLINICAL DATA:  Dorsal foot ulceration with surrounding erythema and swelling. Cellulitis. EXAM: MRI OF THE LEFT FOREFOOT WITHOUT AND WITH CONTRAST TECHNIQUE: Multiplanar, multisequence MR imaging of the left forefoot was performed both before and after administration of intravenous contrast. CONTRAST:  51mL GADAVIST GADOBUTROL 1 MMOL/ML IV SOLN COMPARISON:  Radiographs 08/09/2019 and 07/20/2019 FINDINGS: Bones/Joint/Cartilage No evidence of acute fracture, dislocation, cortical destruction or marrow edema. Specifically, no evidence of osteomyelitis. Mild degenerative changes are present at the 1st metatarsophalangeal joint and in the midfoot. No significant joint  effusions or suspicious synovial enhancement. Ligaments The Lisfranc ligament is intact. The collateral ligaments of the metatarsophalangeal joints appear intact. Muscles and Tendons The forefoot tendons appear intact without significant tenosynovitis or abnormal associated enhancement. No significant muscular abnormalities. Soft tissues There is focal skin ulceration dorsally over the mid 2nd and 3rd metatarsal shafts. There is an underlying ill-defined superficial fluid collection which is dorsal to the extensor tendons. This fluid collection measures approximately 2.6 x 2.2 x 1.0 cm and demonstrates peripheral enhancement following contrast. There is surrounding soft tissue edema and enhancement without additional focal fluid collection. No definite foreign body or other fluid collection identified in the forefoot. IMPRESSION: 1. Focal skin ulceration dorsally over the mid 2nd and 3rd metatarsal heads with underlying ill-defined superficial fluid collection and surrounding soft tissue edema. Findings are consistent with an abscess. 2. No evidence of osteomyelitis, septic joint or infectious tenosynovitis. 3. Mild degenerative changes in the 1st metatarsophalangeal and midfoot. 4. A preliminary report relating these findings was issued by Dr. Marguerita Merles at 2310 hours on 08/09/2019. Electronically Signed   By: Richardean Sale M.D.   On: 08/10/2019 07:59   PERIPHERAL VASCULAR CATHETERIZATION  Result Date: 08/10/2019 See op note  DG Foot 2 Views Left  Result Date: 08/09/2019 CLINICAL DATA:  Pain EXAM: LEFT FOOT - 2 VIEW COMPARISON:  07/20/2019 FINDINGS: There is soft tissue swelling about the dorsal aspect of the foot. There is no acute displaced fracture no dislocation. No radiographic evidence for osteomyelitis. IMPRESSION: No acute osseous abnormality. Soft tissue swelling about the dorsal aspect of the foot. Electronically Signed   By: Constance Holster M.D.   On: 08/09/2019 15:55   DG Foot Complete  Left  Result Date: 07/20/2019 CLINICAL DATA:  Fall EXAM: LEFT FOOT - COMPLETE 3+ VIEW COMPARISON:  None. FINDINGS: Negative for fracture. Mild hallux valgus deformity. Extension deformities of the second through fifth metatarsal phalangeal joint. Soft tissue swelling and gas in the soft tissues dorsally due to contusion and laceration. IMPRESSION: Negative for fracture. Electronically Signed   By: Franchot Gallo M.D.   On: 07/20/2019 14:41    Microbiology: Recent Results (from the past 240 hour(s))  Blood culture (routine x 2)     Status: None (Preliminary result)   Collection Time: 08/09/19  5:35 PM   Specimen: BLOOD  Result Value Ref Range Status   Specimen Description BLOOD RIGHT ANTECUBITAL  Final   Special Requests   Final    BOTTLES DRAWN AEROBIC AND ANAEROBIC Blood Culture adequate volume   Culture   Final    NO GROWTH 3 DAYS Performed at [facility], [address], [place], [place] [address]    Report Status PENDING  Incomplete  Blood culture (routine x 2)  Status: None (Preliminary result)   Collection Time: 08/09/19  5:35 PM   Specimen: BLOOD  Result Value Ref Range Status   Specimen Description   Final    BLOOD Blood Culture results may not be optimal due to an inadequate volume of blood received in culture bottles   Special Requests   Final    BOTTLES DRAWN AEROBIC AND ANAEROBIC LEFT ANTECUBITAL   Culture   Final    NO GROWTH 3 DAYS Performed at [facility], [address] [place], [place] [address]    Report Status PENDING  Incomplete  SARS CORONAVIRUS 2 (TAT 6-24 HRS) Nasopharyngeal Nasopharyngeal Swab     Status: None   Collection Time: 08/09/19  8:08 PM   Specimen: Nasopharyngeal Swab  Result Value Ref Range Status   SARS Coronavirus 2 NEGATIVE NEGATIVE Final    Comment: (NOTE) SARS-CoV-2 target nucleic acids are NOT DETECTED. The SARS-CoV-2 RNA is generally detectable in upper and lower respiratory specimens during the acute phase of  infection. Negative results do not preclude SARS-CoV-2 infection, do not rule out co-infections with other pathogens, and should not be used as the sole basis for treatment or other patient management decisions. Negative results must be combined with clinical observations, patient history, and epidemiological information. The expected result is Negative. Fact Sheet for Patients: SugarRoll.be Fact Sheet for Healthcare Providers: https://www.woods-mathews.com/ This test is not yet approved or cleared by the [place] FDA and  has been authorized for detection and/or diagnosis of SARS-CoV-2 by FDA under an Emergency Use Authorization (EUA). This EUA will remain  in effect (meaning this test can be used) for the duration of the COVID-19 declaration under Section 56 4(b)(1) of the Act, 21 U.S.C. section 360bbb-3(b)(1), unless the authorization is terminated or revoked sooner. Performed at [facility], [place] [address] [place], [place] [address]      Labs: Basic Metabolic Panel: Recent Labs  Lab 08/09/19 1507 08/10/19 0501 08/11/19 1006 08/12/19 0510  NA 139 140 136 139  K 3.9 3.4* 3.4* 3.4*  CL 103 104 101 105  CO2 29 26 26 25   GLUCOSE 121* 101* 132* 94  BUN 17 19 15 21   CREATININE 0.88 0.62 0.82 0.76  CALCIUM 9.3 8.9 8.4* 8.5*  MG  --   --  1.8  --    Liver Function Tests: Recent Labs  Lab 08/09/19 1507 08/10/19 0501 08/11/19 1006 08/12/19 0510  AST 35 37 37 32  ALT 18 21 20 15   ALKPHOS 70 65 60 59  BILITOT 0.7 0.9 0.7 0.7  PROT 7.3 6.7 6.4* 6.0*  ALBUMIN 4.0 3.9 3.6 3.4*   No results for input(s): LIPASE, AMYLASE in the last 168 hours. No results for input(s): AMMONIA in the last 168 hours. CBC: Recent Labs  Lab 08/09/19 1507 08/10/19 0501 08/11/19 1006 08/12/19 0510  WBC 8.8 8.6 8.4 9.8  NEUTROABS 5.5  --  6.2 6.3  HGB 11.2* 10.5* 10.3* 10.4*  HCT 33.8* 31.9* 30.7* 31.3*  MCV 98.3 98.2 96.2 97.5  PLT  300 275 247 245   Cardiac Enzymes: No results for input(s): CKTOTAL, CKMB, CKMBINDEX, TROPONINI in the last 168 hours. BNP: BNP (last 3 results) Recent Labs    06/05/19 1352  BNP 194.0*    ProBNP (last 3 results) No results for input(s): PROBNP in the last 8760 hours.  CBG: Recent Labs  Lab 08/10/19 1716  GLUCAP 79    Signed:  Para Skeans MD.  [facility]  08/12/2019, 2:32 PM

## 2019-08-12 NOTE — Progress Notes (Signed)
Pt is out of bed. Refusing to get back in bed. Wanting to leave the room and go home. Pt is alert to self only. Sitter and myself attempted/ attempting to redirect into bed. NP notified.

## 2019-08-12 NOTE — Progress Notes (Signed)
Physical Therapy Treatment Patient Details Name: Tiffany Charles MRN: [id number] DOB: 02/28/1930 Today's Date: 08/12/2019    History of Present Illness Pt is an 86 y.o. female presenting to hospital with worsening redness, swelling, and discomfort to L foot wound that was sustained February 24th.  Pt admitted with L LE cellulitis/wound.  S/p 3/17 L LE revascularization for limb salvage.  PMH includes angina pectoris, HF, COPD, CAD, a-fib, occipital stroke, dementia, and h/o hip surgery.    PT Comments    Pt resting in bed upon PT arrival; sitter present.  Pt reporting being cold but agreeable to walking with blanket over her shoulders.  Able to walk 520 feet with SBA.  Decreased gait speed noted but overall pt steady (no loss of balance noted during sessions activities).  Will continue to focus on ambulation, strengthening, and higher level balance activities during hospital stay.   Follow Up Recommendations  No PT follow up     Equipment Recommendations  None recommended by PT    Recommendations for Other Services       Precautions / Restrictions Precautions Precautions: Fall Restrictions Weight Bearing Restrictions: No    Mobility  Bed Mobility Overal bed mobility: Modified Independent Bed Mobility: Supine to Sit;Sit to Supine     Supine to sit: Modified independent (Device/Increase time);HOB elevated Sit to supine: Modified independent (Device/Increase time);HOB elevated   General bed mobility comments: No assist required  Transfers Overall transfer level: Needs assistance Equipment used: None Transfers: Sit to/from Stand Sit to Stand: Min guard         General transfer comment: fairly strong steady stand from bed and controlled descent sitting back onto bed  Ambulation/Gait Ambulation/Gait assistance: Supervision Gait Distance (Feet): 520 Feet Assistive device: None   Gait velocity: mildly decreased   General Gait Details: step through gait pattern;  steady   Stairs             Wheelchair Mobility    Modified Rankin (Stroke Patients Only)       Balance Overall balance assessment: Needs assistance;History of Falls Sitting-balance support: No upper extremity supported;Feet supported Sitting balance-Leahy Scale: Normal Sitting balance - Comments: steady sitting reaching outside BOS   Standing balance support: No upper extremity supported;During functional activity Standing balance-Leahy Scale: Good Standing balance comment: no loss of balance noted with ambulation/functional activities                            Cognition Arousal/Alertness: Awake/alert Behavior During Therapy: WFL for tasks assessed/performed Overall Cognitive Status: No family/caregiver present to determine baseline cognitive functioning                                 General Comments: Oriented to person and month      Exercises      General Comments   Nursing cleared pt for participation in physical therapy.  Pt agreeable to PT session.      Pertinent Vitals/Pain Pain Assessment: Faces Faces Pain Scale: Hurts a little bit Pain Location: L foot Pain Descriptors / Indicators: Sore Pain Intervention(s): Limited activity within patient's tolerance;Monitored during session;Repositioned  Vitals HR and O2 on room air stable and WFL throughout treatment session.    Home Living                      Prior Function  PT Goals (current goals can now be found in the care plan section) Acute Rehab PT Goals Patient Stated Goal: to walk more PT Goal Formulation: With patient Time For Goal Achievement: 08/25/19 Potential to Achieve Goals: Good Progress towards PT goals: Progressing toward goals    Frequency    Min 2X/week      PT Plan Current plan remains appropriate    Co-evaluation              AM-PAC PT "6 Clicks" Mobility   Outcome Measure  Help needed turning from your back to  your side while in a flat bed without using bedrails?: None Help needed moving from lying on your back to sitting on the side of a flat bed without using bedrails?: None Help needed moving to and from a bed to a chair (including a wheelchair)?: A Little Help needed standing up from a chair using your arms (e.g., wheelchair or bedside chair)?: A Little Help needed to walk in hospital room?: A Little Help needed climbing 3-5 steps with a railing? : A Little 6 Click Score: 20    End of Session Equipment Utilized During Treatment: Gait belt Activity Tolerance: Patient tolerated treatment well Patient left: in bed;with call bell/phone within reach;with nursing/sitter in room Nurse Communication: Mobility status;Precautions PT Visit Diagnosis: Muscle weakness (generalized) (M62.81)     Time: [id number] PT Time Calculation (min) (ACUTE ONLY): 23 min  Charges:  $Therapeutic Exercise: 23-37 mins                    Leitha Bleak, PT 08/12/19, 12:49 PM

## 2019-08-12 NOTE — TOC Transition Note (Signed)
Transition of Care [facility]) - CM/SW Discharge Note   Patient Details  Name: Tiffany Charles MRN: [id number] Date of Birth: 1930/02/22  [facility] CM/SW Contact:  Su Hilt, RN Phone Number: 08/12/2019, 4:13 PM   Clinical Narrative:    Patient to DC today via EMS to [facility], I called the Terrilee Croak and notified him, he is in agreeance, the patient has had all her Covid vaccines, we are waiting on the covid test results and they are running, The bedside nurse to call report to [facility] and [facility] to transport once report is called   Final next level of care: Skilled Nursing Facility Barriers to Discharge: No Barriers Identified   Patient Goals and CMS Choice Patient states their goals for this hospitalization and ongoing recovery are:: Son would like for patient to go to rehab for wound care CMS Medicare.gov Compare Post Acute Care list provided to:: Patient Represenative (must comment) Choice offered to / list presented to : Adult Children(son Don)  Discharge Placement                       Discharge Plan and Services   Discharge Planning Services: CM Consult Post Acute Care Choice: [place]                               Social Determinants of Health (SDOH) Interventions     Readmission Risk Interventions No flowsheet data found.

## 2019-08-12 NOTE — Plan of Care (Signed)
Patient discharged per MD order. Report called to Nia RN at facility. EMS called for transport.

## 2019-08-12 NOTE — TOC Progression Note (Signed)
Transition of Care [facility]) - Progression Note    Patient Details  Name: TIQUANA LIBBY MRN: [id number] Date of Birth: Mar 15, 1930  [facility] CM/SW Contact  Su Hilt, RN Phone Number: 08/12/2019, 8:39 AM  Clinical Narrative:     I called [place] to inquire on insurance auth for the patient to discharge to [facility], I left a message for a call back including my contact inforamtion  Expected Discharge Plan: [place] Barriers to Discharge: Continued Medical Work up  Expected Discharge Plan and Services Expected Discharge Plan: [place]   Discharge Planning Services: CM Consult Post Acute Care Choice: [place] Living arrangements for the past 2 months: Single Family Home                                       Social Determinants of Health (SDOH) Interventions    Readmission Risk Interventions No flowsheet data found.

## 2019-08-13 DIAGNOSIS — E039 Hypothyroidism, unspecified: Secondary | ICD-10-CM | POA: Diagnosis not present

## 2019-08-13 DIAGNOSIS — R531 Weakness: Secondary | ICD-10-CM | POA: Diagnosis not present

## 2019-08-13 DIAGNOSIS — I272 Pulmonary hypertension, unspecified: Secondary | ICD-10-CM | POA: Diagnosis not present

## 2019-08-13 DIAGNOSIS — Z7901 Long term (current) use of anticoagulants: Secondary | ICD-10-CM | POA: Diagnosis not present

## 2019-08-13 DIAGNOSIS — I4821 Permanent atrial fibrillation: Secondary | ICD-10-CM | POA: Diagnosis not present

## 2019-08-13 DIAGNOSIS — Z681 Body mass index (BMI) 19 or less, adult: Secondary | ICD-10-CM | POA: Diagnosis not present

## 2019-08-13 DIAGNOSIS — S0990XA Unspecified injury of head, initial encounter: Secondary | ICD-10-CM | POA: Diagnosis not present

## 2019-08-13 DIAGNOSIS — E785 Hyperlipidemia, unspecified: Secondary | ICD-10-CM | POA: Diagnosis not present

## 2019-08-13 DIAGNOSIS — I361 Nonrheumatic tricuspid (valve) insufficiency: Secondary | ICD-10-CM | POA: Diagnosis not present

## 2019-08-13 DIAGNOSIS — I472 Ventricular tachycardia: Secondary | ICD-10-CM | POA: Diagnosis not present

## 2019-08-13 DIAGNOSIS — D6832 Hemorrhagic disorder due to extrinsic circulating anticoagulants: Secondary | ICD-10-CM | POA: Diagnosis not present

## 2019-08-13 DIAGNOSIS — N170 Acute kidney failure with tubular necrosis: Secondary | ICD-10-CM | POA: Diagnosis present

## 2019-08-13 DIAGNOSIS — N179 Acute kidney failure, unspecified: Secondary | ICD-10-CM | POA: Diagnosis not present

## 2019-08-13 DIAGNOSIS — I639 Cerebral infarction, unspecified: Secondary | ICD-10-CM | POA: Diagnosis not present

## 2019-08-13 DIAGNOSIS — L97529 Non-pressure chronic ulcer of other part of left foot with unspecified severity: Secondary | ICD-10-CM | POA: Diagnosis not present

## 2019-08-13 DIAGNOSIS — E43 Unspecified severe protein-calorie malnutrition: Secondary | ICD-10-CM | POA: Diagnosis present

## 2019-08-13 DIAGNOSIS — R2689 Other abnormalities of gait and mobility: Secondary | ICD-10-CM | POA: Diagnosis not present

## 2019-08-13 DIAGNOSIS — I70245 Atherosclerosis of native arteries of left leg with ulceration of other part of foot: Secondary | ICD-10-CM | POA: Diagnosis not present

## 2019-08-13 DIAGNOSIS — J9601 Acute respiratory failure with hypoxia: Secondary | ICD-10-CM | POA: Diagnosis not present

## 2019-08-13 DIAGNOSIS — Z66 Do not resuscitate: Secondary | ICD-10-CM | POA: Diagnosis not present

## 2019-08-13 DIAGNOSIS — I5041 Acute combined systolic (congestive) and diastolic (congestive) heart failure: Secondary | ICD-10-CM | POA: Diagnosis not present

## 2019-08-13 DIAGNOSIS — R4182 Altered mental status, unspecified: Secondary | ICD-10-CM | POA: Diagnosis not present

## 2019-08-13 DIAGNOSIS — K920 Hematemesis: Secondary | ICD-10-CM | POA: Diagnosis present

## 2019-08-13 DIAGNOSIS — Z20828 Contact with and (suspected) exposure to other viral communicable diseases: Secondary | ICD-10-CM | POA: Diagnosis not present

## 2019-08-13 DIAGNOSIS — Z79899 Other long term (current) drug therapy: Secondary | ICD-10-CM | POA: Diagnosis not present

## 2019-08-13 DIAGNOSIS — I5043 Acute on chronic combined systolic (congestive) and diastolic (congestive) heart failure: Secondary | ICD-10-CM | POA: Diagnosis not present

## 2019-08-13 DIAGNOSIS — R571 Hypovolemic shock: Secondary | ICD-10-CM | POA: Diagnosis not present

## 2019-08-13 DIAGNOSIS — R64 Cachexia: Secondary | ICD-10-CM | POA: Diagnosis present

## 2019-08-13 DIAGNOSIS — I340 Nonrheumatic mitral (valve) insufficiency: Secondary | ICD-10-CM | POA: Diagnosis not present

## 2019-08-13 DIAGNOSIS — Z7982 Long term (current) use of aspirin: Secondary | ICD-10-CM | POA: Diagnosis not present

## 2019-08-13 DIAGNOSIS — M6281 Muscle weakness (generalized): Secondary | ICD-10-CM | POA: Diagnosis not present

## 2019-08-13 DIAGNOSIS — I255 Ischemic cardiomyopathy: Secondary | ICD-10-CM | POA: Diagnosis not present

## 2019-08-13 DIAGNOSIS — L03116 Cellulitis of left lower limb: Secondary | ICD-10-CM | POA: Diagnosis not present

## 2019-08-13 DIAGNOSIS — J431 Panlobular emphysema: Secondary | ICD-10-CM | POA: Diagnosis not present

## 2019-08-13 DIAGNOSIS — R578 Other shock: Secondary | ICD-10-CM | POA: Diagnosis not present

## 2019-08-13 DIAGNOSIS — T68XXXA Hypothermia, initial encounter: Secondary | ICD-10-CM | POA: Diagnosis not present

## 2019-08-13 DIAGNOSIS — W1830XA Fall on same level, unspecified, initial encounter: Secondary | ICD-10-CM | POA: Diagnosis present

## 2019-08-13 DIAGNOSIS — Z515 Encounter for palliative care: Secondary | ICD-10-CM | POA: Diagnosis not present

## 2019-08-13 DIAGNOSIS — R2681 Unsteadiness on feet: Secondary | ICD-10-CM | POA: Diagnosis not present

## 2019-08-13 DIAGNOSIS — J690 Pneumonitis due to inhalation of food and vomit: Secondary | ICD-10-CM | POA: Diagnosis not present

## 2019-08-13 DIAGNOSIS — Z7189 Other specified counseling: Secondary | ICD-10-CM | POA: Diagnosis not present

## 2019-08-13 DIAGNOSIS — M2550 Pain in unspecified joint: Secondary | ICD-10-CM | POA: Diagnosis not present

## 2019-08-13 DIAGNOSIS — I214 Non-ST elevation (NSTEMI) myocardial infarction: Secondary | ICD-10-CM | POA: Diagnosis not present

## 2019-08-13 DIAGNOSIS — D649 Anemia, unspecified: Secondary | ICD-10-CM | POA: Diagnosis not present

## 2019-08-13 DIAGNOSIS — R58 Hemorrhage, not elsewhere classified: Secondary | ICD-10-CM | POA: Diagnosis not present

## 2019-08-13 DIAGNOSIS — R278 Other lack of coordination: Secondary | ICD-10-CM | POA: Diagnosis not present

## 2019-08-13 DIAGNOSIS — K5900 Constipation, unspecified: Secondary | ICD-10-CM | POA: Diagnosis not present

## 2019-08-13 DIAGNOSIS — R41841 Cognitive communication deficit: Secondary | ICD-10-CM | POA: Diagnosis not present

## 2019-08-13 DIAGNOSIS — L89891 Pressure ulcer of other site, stage 1: Secondary | ICD-10-CM | POA: Diagnosis present

## 2019-08-13 DIAGNOSIS — I2510 Atherosclerotic heart disease of native coronary artery without angina pectoris: Secondary | ICD-10-CM | POA: Diagnosis not present

## 2019-08-13 DIAGNOSIS — I110 Hypertensive heart disease with heart failure: Secondary | ICD-10-CM | POA: Diagnosis present

## 2019-08-13 DIAGNOSIS — E559 Vitamin D deficiency, unspecified: Secondary | ICD-10-CM | POA: Diagnosis not present

## 2019-08-13 DIAGNOSIS — L89001 Pressure ulcer of unspecified elbow, stage 1: Secondary | ICD-10-CM | POA: Diagnosis not present

## 2019-08-13 DIAGNOSIS — R404 Transient alteration of awareness: Secondary | ICD-10-CM | POA: Diagnosis not present

## 2019-08-13 DIAGNOSIS — R269 Unspecified abnormalities of gait and mobility: Secondary | ICD-10-CM | POA: Diagnosis not present

## 2019-08-13 DIAGNOSIS — L89301 Pressure ulcer of unspecified buttock, stage 1: Secondary | ICD-10-CM | POA: Diagnosis not present

## 2019-08-13 DIAGNOSIS — R319 Hematuria, unspecified: Secondary | ICD-10-CM | POA: Diagnosis not present

## 2019-08-13 DIAGNOSIS — Z4682 Encounter for fitting and adjustment of non-vascular catheter: Secondary | ICD-10-CM | POA: Diagnosis not present

## 2019-08-13 DIAGNOSIS — L0390 Cellulitis, unspecified: Secondary | ICD-10-CM | POA: Diagnosis not present

## 2019-08-13 DIAGNOSIS — S199XXA Unspecified injury of neck, initial encounter: Secondary | ICD-10-CM | POA: Diagnosis not present

## 2019-08-13 DIAGNOSIS — N390 Urinary tract infection, site not specified: Secondary | ICD-10-CM | POA: Diagnosis not present

## 2019-08-13 DIAGNOSIS — I5032 Chronic diastolic (congestive) heart failure: Secondary | ICD-10-CM | POA: Diagnosis not present

## 2019-08-13 DIAGNOSIS — E872 Acidosis: Secondary | ICD-10-CM | POA: Diagnosis present

## 2019-08-13 DIAGNOSIS — Z7401 Bed confinement status: Secondary | ICD-10-CM | POA: Diagnosis not present

## 2019-08-13 DIAGNOSIS — I213 ST elevation (STEMI) myocardial infarction of unspecified site: Secondary | ICD-10-CM | POA: Diagnosis not present

## 2019-08-13 DIAGNOSIS — I4891 Unspecified atrial fibrillation: Secondary | ICD-10-CM | POA: Diagnosis not present

## 2019-08-13 DIAGNOSIS — I739 Peripheral vascular disease, unspecified: Secondary | ICD-10-CM | POA: Diagnosis not present

## 2019-08-13 DIAGNOSIS — K922 Gastrointestinal hemorrhage, unspecified: Secondary | ICD-10-CM | POA: Diagnosis not present

## 2019-08-13 DIAGNOSIS — J410 Simple chronic bronchitis: Secondary | ICD-10-CM | POA: Diagnosis not present

## 2019-08-13 DIAGNOSIS — I471 Supraventricular tachycardia: Secondary | ICD-10-CM | POA: Diagnosis present

## 2019-08-13 DIAGNOSIS — E7800 Pure hypercholesterolemia, unspecified: Secondary | ICD-10-CM | POA: Diagnosis not present

## 2019-08-13 DIAGNOSIS — D62 Acute posthemorrhagic anemia: Secondary | ICD-10-CM | POA: Diagnosis not present

## 2019-08-13 DIAGNOSIS — K7200 Acute and subacute hepatic failure without coma: Secondary | ICD-10-CM | POA: Diagnosis present

## 2019-08-13 DIAGNOSIS — Z20822 Contact with and (suspected) exposure to covid-19: Secondary | ICD-10-CM | POA: Diagnosis not present

## 2019-08-13 DIAGNOSIS — J9690 Respiratory failure, unspecified, unspecified whether with hypoxia or hypercapnia: Secondary | ICD-10-CM | POA: Diagnosis not present

## 2019-08-13 DIAGNOSIS — G9341 Metabolic encephalopathy: Secondary | ICD-10-CM | POA: Diagnosis not present

## 2019-08-13 LAB — CBC WITH DIFFERENTIAL/PLATELET
Abs Immature Granulocytes: 0.04 10*3/uL (ref 0.00–0.07)
Basophils Absolute: 0.1 10*3/uL (ref 0.0–0.1)
Basophils Relative: 1 %
Eosinophils Absolute: 0.3 10*3/uL (ref 0.0–0.5)
Eosinophils Relative: 3 %
HCT: 29.2 % — ABNORMAL LOW (ref 36.0–46.0)
Hemoglobin: 9.6 g/dL — ABNORMAL LOW (ref 12.0–15.0)
Immature Granulocytes: 1 %
Lymphocytes Relative: 27 %
Lymphs Abs: 2.3 10*3/uL (ref 0.7–4.0)
MCH: 32 pg (ref 26.0–34.0)
MCHC: 32.9 g/dL (ref 30.0–36.0)
MCV: 97.3 fL (ref 80.0–100.0)
Monocytes Absolute: 1.1 10*3/uL — ABNORMAL HIGH (ref 0.1–1.0)
Monocytes Relative: 12 %
Neutro Abs: 4.9 10*3/uL (ref 1.7–7.7)
Neutrophils Relative %: 56 %
Platelets: 240 10*3/uL (ref 150–400)
RBC: 3 MIL/uL — ABNORMAL LOW (ref 3.87–5.11)
RDW: 13 % (ref 11.5–15.5)
WBC: 8.7 10*3/uL (ref 4.0–10.5)
nRBC: 0 % (ref 0.0–0.2)

## 2019-08-13 LAB — COMPREHENSIVE METABOLIC PANEL
ALT: 13 U/L (ref 0–44)
AST: 23 U/L (ref 15–41)
Albumin: 3.3 g/dL — ABNORMAL LOW (ref 3.5–5.0)
Alkaline Phosphatase: 54 U/L (ref 38–126)
Anion gap: 11 (ref 5–15)
BUN: 17 mg/dL (ref 8–23)
CO2: 22 mmol/L (ref 22–32)
Calcium: 8.5 mg/dL — ABNORMAL LOW (ref 8.9–10.3)
Chloride: 104 mmol/L (ref 98–111)
Creatinine, Ser: 0.6 mg/dL (ref 0.44–1.00)
GFR calc Af Amer: >60 mL/min (ref >60)
GFR calc non Af Amer: >60 mL/min (ref >60)
Glucose, Bld: 138 mg/dL — ABNORMAL HIGH (ref 70–99)
Potassium: 3.7 mmol/L (ref 3.5–5.1)
Sodium: 137 mmol/L (ref 135–145)
Total Bilirubin: 0.7 mg/dL (ref 0.3–1.2)
Total Protein: 6.1 g/dL — ABNORMAL LOW (ref 6.5–8.1)

## 2019-08-13 NOTE — Progress Notes (Signed)
Morning medications administered per orders. Pt. Has completed breakfast. Sitter remains at bedside. Denies any pain or needs at this time. Call light is within reach, will continue to monitor.   EMS has verified that she is still on the list for transport.

## 2019-08-13 NOTE — Progress Notes (Signed)
Bedside shift report received from [place], [place]. Morning assessment completed. Pt. Resting in bed, arouses easily to name calling. Pt. Has been discharged - but waiting on ems for transport. Will follow up with ems to confirm pt. Is still on list.

## 2019-08-14 LAB — CULTURE, BLOOD (ROUTINE X 2)
Culture: NO GROWTH
Culture: NO GROWTH
Special Requests: ADEQUATE

## 2019-08-15 ENCOUNTER — Ambulatory Visit: Payer: Medicare HMO | Admitting: Physician Assistant

## 2019-08-15 ENCOUNTER — Telehealth: Payer: Self-pay | Admitting: *Deleted

## 2019-08-15 DIAGNOSIS — Z20828 Contact with and (suspected) exposure to other viral communicable diseases: Secondary | ICD-10-CM | POA: Diagnosis not present

## 2019-08-15 NOTE — Telephone Encounter (Signed)
Pt was on TCM report admitted for cellulitis of left lower extremity on 08/09/19, and podiatry was consulted along with vascular and pt was seen by vascular and found to have arterial blockage by angiogram and has recanalization. Pt has been stable throughout stay and pt has tolerated the procedure well and her kidney function has remained stable. Patient does have dementia and is a poor historian. Pt D/C 08/13/19 to SNF...Johny Chess

## 2019-08-16 DIAGNOSIS — L0390 Cellulitis, unspecified: Secondary | ICD-10-CM | POA: Diagnosis not present

## 2019-08-16 DIAGNOSIS — I4891 Unspecified atrial fibrillation: Secondary | ICD-10-CM | POA: Diagnosis not present

## 2019-08-16 DIAGNOSIS — R269 Unspecified abnormalities of gait and mobility: Secondary | ICD-10-CM | POA: Diagnosis not present

## 2019-08-16 DIAGNOSIS — I739 Peripheral vascular disease, unspecified: Secondary | ICD-10-CM | POA: Diagnosis not present

## 2019-08-18 ENCOUNTER — Encounter (HOSPITAL_BASED_OUTPATIENT_CLINIC_OR_DEPARTMENT_OTHER): Payer: Medicare HMO | Admitting: Internal Medicine

## 2019-08-23 ENCOUNTER — Other Ambulatory Visit (INDEPENDENT_AMBULATORY_CARE_PROVIDER_SITE_OTHER): Payer: Self-pay | Admitting: Vascular Surgery

## 2019-08-23 DIAGNOSIS — Z95820 Peripheral vascular angioplasty status with implants and grafts: Secondary | ICD-10-CM

## 2019-08-23 DIAGNOSIS — I70249 Atherosclerosis of native arteries of left leg with ulceration of unspecified site: Secondary | ICD-10-CM

## 2019-08-25 ENCOUNTER — Ambulatory Visit (INDEPENDENT_AMBULATORY_CARE_PROVIDER_SITE_OTHER): Payer: Medicare HMO | Admitting: Vascular Surgery

## 2019-08-25 ENCOUNTER — Encounter (INDEPENDENT_AMBULATORY_CARE_PROVIDER_SITE_OTHER): Payer: Medicare HMO

## 2019-08-30 NOTE — Telephone Encounter (Signed)
Agree as per office policy

## 2019-08-31 DIAGNOSIS — L03116 Cellulitis of left lower limb: Secondary | ICD-10-CM | POA: Diagnosis not present

## 2019-09-01 ENCOUNTER — Inpatient Hospital Stay (HOSPITAL_COMMUNITY): Payer: Medicare HMO

## 2019-09-01 ENCOUNTER — Encounter: Payer: Self-pay | Admitting: Internal Medicine

## 2019-09-01 ENCOUNTER — Inpatient Hospital Stay (HOSPITAL_COMMUNITY)
Admission: EM | Admit: 2019-09-01 | Discharge: 2019-09-27 | DRG: 813 | Disposition: A | Discharge status: Home / Self Care | Payer: Medicare HMO | Source: Skilled Nursing Facility | Attending: Internal Medicine | Admitting: Internal Medicine

## 2019-09-01 ENCOUNTER — Emergency Department (HOSPITAL_COMMUNITY): Payer: Medicare HMO

## 2019-09-01 ENCOUNTER — Other Ambulatory Visit: Payer: Self-pay

## 2019-09-01 DIAGNOSIS — I4821 Permanent atrial fibrillation: Secondary | ICD-10-CM | POA: Diagnosis present

## 2019-09-01 DIAGNOSIS — Z8249 Family history of ischemic heart disease and other diseases of the circulatory system: Secondary | ICD-10-CM

## 2019-09-01 DIAGNOSIS — Z7901 Long term (current) use of anticoagulants: Secondary | ICD-10-CM

## 2019-09-01 DIAGNOSIS — W1830XA Fall on same level, unspecified, initial encounter: Secondary | ICD-10-CM | POA: Diagnosis present

## 2019-09-01 DIAGNOSIS — E559 Vitamin D deficiency, unspecified: Secondary | ICD-10-CM | POA: Diagnosis not present

## 2019-09-01 DIAGNOSIS — N390 Urinary tract infection, site not specified: Secondary | ICD-10-CM | POA: Diagnosis not present

## 2019-09-01 DIAGNOSIS — K920 Hematemesis: Secondary | ICD-10-CM | POA: Diagnosis present

## 2019-09-01 DIAGNOSIS — I472 Ventricular tachycardia: Secondary | ICD-10-CM | POA: Diagnosis not present

## 2019-09-01 DIAGNOSIS — R404 Transient alteration of awareness: Secondary | ICD-10-CM | POA: Diagnosis not present

## 2019-09-01 DIAGNOSIS — E872 Acidosis: Secondary | ICD-10-CM | POA: Diagnosis present

## 2019-09-01 DIAGNOSIS — L89001 Pressure ulcer of unspecified elbow, stage 1: Secondary | ICD-10-CM | POA: Diagnosis not present

## 2019-09-01 DIAGNOSIS — I2510 Atherosclerotic heart disease of native coronary artery without angina pectoris: Secondary | ICD-10-CM | POA: Diagnosis not present

## 2019-09-01 DIAGNOSIS — J9601 Acute respiratory failure with hypoxia: Secondary | ICD-10-CM | POA: Diagnosis present

## 2019-09-01 DIAGNOSIS — I110 Hypertensive heart disease with heart failure: Secondary | ICD-10-CM | POA: Diagnosis present

## 2019-09-01 DIAGNOSIS — Z66 Do not resuscitate: Secondary | ICD-10-CM | POA: Diagnosis not present

## 2019-09-01 DIAGNOSIS — Z515 Encounter for palliative care: Secondary | ICD-10-CM

## 2019-09-01 DIAGNOSIS — J9690 Respiratory failure, unspecified, unspecified whether with hypoxia or hypercapnia: Secondary | ICD-10-CM

## 2019-09-01 DIAGNOSIS — Z20822 Contact with and (suspected) exposure to covid-19: Secondary | ICD-10-CM | POA: Diagnosis present

## 2019-09-01 DIAGNOSIS — G9341 Metabolic encephalopathy: Secondary | ICD-10-CM | POA: Diagnosis not present

## 2019-09-01 DIAGNOSIS — L89891 Pressure ulcer of other site, stage 1: Secondary | ICD-10-CM | POA: Diagnosis present

## 2019-09-01 DIAGNOSIS — L89301 Pressure ulcer of unspecified buttock, stage 1: Secondary | ICD-10-CM | POA: Diagnosis not present

## 2019-09-01 DIAGNOSIS — I255 Ischemic cardiomyopathy: Secondary | ICD-10-CM | POA: Diagnosis not present

## 2019-09-01 DIAGNOSIS — E43 Unspecified severe protein-calorie malnutrition: Secondary | ICD-10-CM | POA: Diagnosis present

## 2019-09-01 DIAGNOSIS — Z781 Physical restraint status: Secondary | ICD-10-CM

## 2019-09-01 DIAGNOSIS — T68XXXA Hypothermia, initial encounter: Secondary | ICD-10-CM | POA: Diagnosis not present

## 2019-09-01 DIAGNOSIS — J690 Pneumonitis due to inhalation of food and vomit: Secondary | ICD-10-CM | POA: Diagnosis present

## 2019-09-01 DIAGNOSIS — D6832 Hemorrhagic disorder due to extrinsic circulating anticoagulants: Principal | ICD-10-CM | POA: Diagnosis present

## 2019-09-01 DIAGNOSIS — R0602 Shortness of breath: Secondary | ICD-10-CM | POA: Diagnosis not present

## 2019-09-01 DIAGNOSIS — L89321 Pressure ulcer of left buttock, stage 1: Secondary | ICD-10-CM | POA: Diagnosis not present

## 2019-09-01 DIAGNOSIS — R58 Hemorrhage, not elsewhere classified: Secondary | ICD-10-CM | POA: Diagnosis not present

## 2019-09-01 DIAGNOSIS — L8990 Pressure ulcer of unspecified site, unspecified stage: Secondary | ICD-10-CM | POA: Diagnosis present

## 2019-09-01 DIAGNOSIS — F0390 Unspecified dementia without behavioral disturbance: Secondary | ICD-10-CM | POA: Diagnosis present

## 2019-09-01 DIAGNOSIS — Z681 Body mass index (BMI) 19 or less, adult: Secondary | ICD-10-CM | POA: Diagnosis not present

## 2019-09-01 DIAGNOSIS — Z95828 Presence of other vascular implants and grafts: Secondary | ICD-10-CM

## 2019-09-01 DIAGNOSIS — L8991 Pressure ulcer of unspecified site, stage 1: Secondary | ICD-10-CM | POA: Diagnosis present

## 2019-09-01 DIAGNOSIS — K7200 Acute and subacute hepatic failure without coma: Secondary | ICD-10-CM | POA: Diagnosis not present

## 2019-09-01 DIAGNOSIS — D62 Acute posthemorrhagic anemia: Secondary | ICD-10-CM | POA: Diagnosis present

## 2019-09-01 DIAGNOSIS — R319 Hematuria, unspecified: Secondary | ICD-10-CM | POA: Diagnosis not present

## 2019-09-01 DIAGNOSIS — I272 Pulmonary hypertension, unspecified: Secondary | ICD-10-CM | POA: Diagnosis not present

## 2019-09-01 DIAGNOSIS — R571 Hypovolemic shock: Secondary | ICD-10-CM | POA: Diagnosis not present

## 2019-09-01 DIAGNOSIS — S0990XA Unspecified injury of head, initial encounter: Secondary | ICD-10-CM | POA: Diagnosis not present

## 2019-09-01 DIAGNOSIS — R1310 Dysphagia, unspecified: Secondary | ICD-10-CM | POA: Diagnosis present

## 2019-09-01 DIAGNOSIS — R451 Restlessness and agitation: Secondary | ICD-10-CM | POA: Diagnosis not present

## 2019-09-01 DIAGNOSIS — I4891 Unspecified atrial fibrillation: Secondary | ICD-10-CM | POA: Diagnosis not present

## 2019-09-01 DIAGNOSIS — I214 Non-ST elevation (NSTEMI) myocardial infarction: Secondary | ICD-10-CM | POA: Diagnosis not present

## 2019-09-01 DIAGNOSIS — I5032 Chronic diastolic (congestive) heart failure: Secondary | ICD-10-CM | POA: Diagnosis not present

## 2019-09-01 DIAGNOSIS — R7401 Elevation of levels of liver transaminase levels: Secondary | ICD-10-CM | POA: Diagnosis present

## 2019-09-01 DIAGNOSIS — J410 Simple chronic bronchitis: Secondary | ICD-10-CM

## 2019-09-01 DIAGNOSIS — R578 Other shock: Secondary | ICD-10-CM | POA: Diagnosis not present

## 2019-09-01 DIAGNOSIS — I739 Peripheral vascular disease, unspecified: Secondary | ICD-10-CM | POA: Diagnosis present

## 2019-09-01 DIAGNOSIS — R531 Weakness: Secondary | ICD-10-CM

## 2019-09-01 DIAGNOSIS — I361 Nonrheumatic tricuspid (valve) insufficiency: Secondary | ICD-10-CM | POA: Diagnosis not present

## 2019-09-01 DIAGNOSIS — R64 Cachexia: Secondary | ICD-10-CM | POA: Diagnosis present

## 2019-09-01 DIAGNOSIS — I5041 Acute combined systolic (congestive) and diastolic (congestive) heart failure: Secondary | ICD-10-CM | POA: Diagnosis not present

## 2019-09-01 DIAGNOSIS — J431 Panlobular emphysema: Secondary | ICD-10-CM | POA: Diagnosis not present

## 2019-09-01 DIAGNOSIS — T45515A Adverse effect of anticoagulants, initial encounter: Secondary | ICD-10-CM | POA: Diagnosis present

## 2019-09-01 DIAGNOSIS — I340 Nonrheumatic mitral (valve) insufficiency: Secondary | ICD-10-CM | POA: Diagnosis not present

## 2019-09-01 DIAGNOSIS — Z87891 Personal history of nicotine dependence: Secondary | ICD-10-CM

## 2019-09-01 DIAGNOSIS — I639 Cerebral infarction, unspecified: Secondary | ICD-10-CM | POA: Diagnosis present

## 2019-09-01 DIAGNOSIS — Z7189 Other specified counseling: Secondary | ICD-10-CM | POA: Diagnosis not present

## 2019-09-01 DIAGNOSIS — Z7982 Long term (current) use of aspirin: Secondary | ICD-10-CM

## 2019-09-01 DIAGNOSIS — E876 Hypokalemia: Secondary | ICD-10-CM | POA: Diagnosis not present

## 2019-09-01 DIAGNOSIS — R54 Age-related physical debility: Secondary | ICD-10-CM | POA: Diagnosis present

## 2019-09-01 DIAGNOSIS — N179 Acute kidney failure, unspecified: Secondary | ICD-10-CM | POA: Diagnosis not present

## 2019-09-01 DIAGNOSIS — R059 Cough, unspecified: Secondary | ICD-10-CM

## 2019-09-01 DIAGNOSIS — R05 Cough: Secondary | ICD-10-CM

## 2019-09-01 DIAGNOSIS — I5043 Acute on chronic combined systolic (congestive) and diastolic (congestive) heart failure: Secondary | ICD-10-CM | POA: Diagnosis not present

## 2019-09-01 DIAGNOSIS — Z0189 Encounter for other specified special examinations: Secondary | ICD-10-CM

## 2019-09-01 DIAGNOSIS — B952 Enterococcus as the cause of diseases classified elsewhere: Secondary | ICD-10-CM | POA: Diagnosis present

## 2019-09-01 DIAGNOSIS — N170 Acute kidney failure with tubular necrosis: Secondary | ICD-10-CM | POA: Diagnosis present

## 2019-09-01 DIAGNOSIS — J449 Chronic obstructive pulmonary disease, unspecified: Secondary | ICD-10-CM | POA: Diagnosis present

## 2019-09-01 DIAGNOSIS — I471 Supraventricular tachycardia: Secondary | ICD-10-CM | POA: Diagnosis present

## 2019-09-01 DIAGNOSIS — E785 Hyperlipidemia, unspecified: Secondary | ICD-10-CM | POA: Diagnosis present

## 2019-09-01 DIAGNOSIS — Z955 Presence of coronary angioplasty implant and graft: Secondary | ICD-10-CM

## 2019-09-01 DIAGNOSIS — Z8673 Personal history of transient ischemic attack (TIA), and cerebral infarction without residual deficits: Secondary | ICD-10-CM

## 2019-09-01 DIAGNOSIS — K922 Gastrointestinal hemorrhage, unspecified: Secondary | ICD-10-CM | POA: Diagnosis not present

## 2019-09-01 DIAGNOSIS — Z823 Family history of stroke: Secondary | ICD-10-CM

## 2019-09-01 DIAGNOSIS — Z452 Encounter for adjustment and management of vascular access device: Secondary | ICD-10-CM | POA: Diagnosis not present

## 2019-09-01 DIAGNOSIS — S199XXA Unspecified injury of neck, initial encounter: Secondary | ICD-10-CM | POA: Diagnosis not present

## 2019-09-01 DIAGNOSIS — Z79899 Other long term (current) drug therapy: Secondary | ICD-10-CM

## 2019-09-01 DIAGNOSIS — F329 Major depressive disorder, single episode, unspecified: Secondary | ICD-10-CM | POA: Diagnosis present

## 2019-09-01 DIAGNOSIS — Z85828 Personal history of other malignant neoplasm of skin: Secondary | ICD-10-CM

## 2019-09-01 DIAGNOSIS — Z4682 Encounter for fitting and adjustment of non-vascular catheter: Secondary | ICD-10-CM | POA: Diagnosis not present

## 2019-09-01 DIAGNOSIS — Z885 Allergy status to narcotic agent status: Secondary | ICD-10-CM

## 2019-09-01 DIAGNOSIS — E7800 Pure hypercholesterolemia, unspecified: Secondary | ICD-10-CM | POA: Diagnosis not present

## 2019-09-01 DIAGNOSIS — K5900 Constipation, unspecified: Secondary | ICD-10-CM | POA: Diagnosis not present

## 2019-09-01 DIAGNOSIS — R0902 Hypoxemia: Secondary | ICD-10-CM | POA: Diagnosis not present

## 2019-09-01 DIAGNOSIS — J90 Pleural effusion, not elsewhere classified: Secondary | ICD-10-CM | POA: Diagnosis not present

## 2019-09-01 DIAGNOSIS — Z90710 Acquired absence of both cervix and uterus: Secondary | ICD-10-CM

## 2019-09-01 DIAGNOSIS — I083 Combined rheumatic disorders of mitral, aortic and tricuspid valves: Secondary | ICD-10-CM | POA: Diagnosis present

## 2019-09-01 DIAGNOSIS — I213 ST elevation (STEMI) myocardial infarction of unspecified site: Secondary | ICD-10-CM | POA: Diagnosis not present

## 2019-09-01 LAB — CBC WITH DIFFERENTIAL/PLATELET
Abs Immature Granulocytes: 0.11 10*3/uL — ABNORMAL HIGH (ref 0.00–0.07)
Basophils Absolute: 0 10*3/uL (ref 0.0–0.1)
Basophils Relative: 0 %
Eosinophils Absolute: 0 10*3/uL (ref 0.0–0.5)
Eosinophils Relative: 0 %
HCT: 20.2 % — ABNORMAL LOW (ref 36.0–46.0)
Hemoglobin: 5.5 g/dL — CL (ref 12.0–15.0)
Immature Granulocytes: 1 %
Lymphocytes Relative: 7 %
Lymphs Abs: 0.8 10*3/uL (ref 0.7–4.0)
MCH: 28.8 pg (ref 26.0–34.0)
MCHC: 27.2 g/dL — ABNORMAL LOW (ref 30.0–36.0)
MCV: 105.8 fL — ABNORMAL HIGH (ref 80.0–100.0)
Monocytes Absolute: 1.1 10*3/uL — ABNORMAL HIGH (ref 0.1–1.0)
Monocytes Relative: 10 %
Neutro Abs: 8.7 10*3/uL — ABNORMAL HIGH (ref 1.7–7.7)
Neutrophils Relative %: 82 %
Platelets: 295 10*3/uL (ref 150–400)
RBC: 1.91 MIL/uL — ABNORMAL LOW (ref 3.87–5.11)
RDW: 16 % — ABNORMAL HIGH (ref 11.5–15.5)
WBC: 10.7 10*3/uL — ABNORMAL HIGH (ref 4.0–10.5)
nRBC: 5.4 % — ABNORMAL HIGH (ref 0.0–0.2)

## 2019-09-01 LAB — GLUCOSE, CAPILLARY: Glucose-Capillary: 132 mg/dL — ABNORMAL HIGH (ref 70–99)

## 2019-09-01 LAB — URINALYSIS, ROUTINE W REFLEX MICROSCOPIC
Bilirubin Urine: NEGATIVE
Glucose, UA: NEGATIVE mg/dL
Hgb urine dipstick: NEGATIVE
Ketones, ur: NEGATIVE mg/dL
Leukocytes,Ua: NEGATIVE
Nitrite: NEGATIVE
Protein, ur: 100 mg/dL — AB
Specific Gravity, Urine: 1.016 (ref 1.005–1.030)
pH: 5 (ref 5.0–8.0)

## 2019-09-01 LAB — ABO/RH: ABO/RH(D): A POS

## 2019-09-01 LAB — COMPREHENSIVE METABOLIC PANEL
ALT: 43 U/L (ref 0–44)
AST: 68 U/L — ABNORMAL HIGH (ref 15–41)
Albumin: 3 g/dL — ABNORMAL LOW (ref 3.5–5.0)
Alkaline Phosphatase: 48 U/L (ref 38–126)
BUN: 66 mg/dL — ABNORMAL HIGH (ref 8–23)
CO2: <7 mmol/L — ABNORMAL LOW (ref 22–32)
Calcium: 8.4 mg/dL — ABNORMAL LOW (ref 8.9–10.3)
Chloride: 108 mmol/L (ref 98–111)
Creatinine, Ser: 1.79 mg/dL — ABNORMAL HIGH (ref 0.44–1.00)
GFR calc Af Amer: 29 mL/min — ABNORMAL LOW (ref >60)
GFR calc non Af Amer: 25 mL/min — ABNORMAL LOW (ref >60)
Glucose, Bld: 140 mg/dL — ABNORMAL HIGH (ref 70–99)
Potassium: 4.5 mmol/L (ref 3.5–5.1)
Sodium: 141 mmol/L (ref 135–145)
Total Bilirubin: 0.7 mg/dL (ref 0.3–1.2)
Total Protein: 5.1 g/dL — ABNORMAL LOW (ref 6.5–8.1)

## 2019-09-01 LAB — RESPIRATORY PANEL BY RT PCR (FLU A&B, COVID)
Influenza A by PCR: NEGATIVE
Influenza B by PCR: NEGATIVE
SARS Coronavirus 2 by RT PCR: NEGATIVE

## 2019-09-01 LAB — CBG MONITORING, ED
Glucose-Capillary: 71 mg/dL (ref 70–99)
Glucose-Capillary: 238 mg/dL — ABNORMAL HIGH (ref 70–99)

## 2019-09-01 LAB — POC OCCULT BLOOD, ED: Fecal Occult Bld: POSITIVE — AB

## 2019-09-01 LAB — PROTIME-INR
INR: >10 (ref 0.8–1.2)
Prothrombin Time: >90 seconds — ABNORMAL HIGH (ref 11.4–15.2)

## 2019-09-01 LAB — PREPARE RBC (CROSSMATCH)

## 2019-09-01 LAB — LACTIC ACID, PLASMA: Lactic Acid, Venous: >11 mmol/L (ref 0.5–1.9)

## 2019-09-01 MED ORDER — ONDANSETRON HCL 4 MG/2ML IJ SOLN: 4.0000 mg | Freq: Four times a day (QID) | INTRAMUSCULAR | Status: DC | PRN

## 2019-09-01 MED ORDER — METRONIDAZOLE IN NACL 5-0.79 MG/ML-% IV SOLN
500.0000 mg | Freq: Once | INTRAVENOUS | Status: AC
  Administered 2019-09-01: 22:00:00 500 mg via INTRAVENOUS
  Filled 2019-09-01: qty 100

## 2019-09-01 MED ORDER — SODIUM CHLORIDE 0.9% IV SOLUTION: Freq: Once | INTRAVENOUS | Status: DC

## 2019-09-01 MED ORDER — NOREPINEPHRINE 4 MG/250ML-% IV SOLN
INTRAVENOUS | Status: AC
  Administered 2019-09-01: 5 ug/min via INTRAVENOUS
  Filled 2019-09-01: qty 250

## 2019-09-01 MED ORDER — EPINEPHRINE 1 MG/10ML IJ SOSY
PREFILLED_SYRINGE | INTRAMUSCULAR | Status: DC | PRN
  Administered 2019-09-01: 0.2 mg via INTRAVENOUS

## 2019-09-01 MED ORDER — SODIUM CHLORIDE 0.9 % IV SOLN
80.0000 mg | Freq: Once | INTRAVENOUS | Status: AC
  Administered 2019-09-01: 80 mg via INTRAVENOUS
  Filled 2019-09-01: qty 80

## 2019-09-01 MED ORDER — SODIUM CHLORIDE 0.9 % IV BOLUS (SEPSIS)
500.0000 mL | Freq: Once | INTRAVENOUS | Status: AC
  Administered 2019-09-01: 500 mL via INTRAVENOUS

## 2019-09-01 MED ORDER — DEXTROSE 50 % IV SOLN
50.0000 mL | Freq: Once | INTRAVENOUS | Status: AC
  Administered 2019-09-01: 20:00:00 50 mL via INTRAVENOUS
  Filled 2019-09-01: qty 50

## 2019-09-01 MED ORDER — ETOMIDATE 2 MG/ML IV SOLN
INTRAVENOUS | Status: AC | PRN
  Administered 2019-09-01: 20 mg via INTRAVENOUS

## 2019-09-01 MED ORDER — SODIUM CHLORIDE 0.9% IV SOLUTION: Freq: Once | INTRAVENOUS | Status: AC

## 2019-09-01 MED ORDER — NOREPINEPHRINE 4 MG/250ML-% IV SOLN
0.0000 ug/min | INTRAVENOUS | Status: DC
  Administered 2019-09-01: 5 ug/min via INTRAVENOUS
  Administered 2019-09-02 (×2): 10 ug/min via INTRAVENOUS
  Administered 2019-09-02: 19:00:00 8 ug/min via INTRAVENOUS
  Administered 2019-09-03 – 2019-09-04 (×2): 5 ug/min via INTRAVENOUS
  Filled 2019-09-01 (×7): qty 250

## 2019-09-01 MED ORDER — IOHEXOL 350 MG/ML SOLN
80.0000 mL | Freq: Once | INTRAVENOUS | Status: AC | PRN
  Administered 2019-09-01: 80 mL via INTRAVENOUS

## 2019-09-01 MED ORDER — LEVALBUTEROL HCL 0.63 MG/3ML IN NEBU: 0.6300 mg | INHALATION_SOLUTION | RESPIRATORY_TRACT | Status: DC | PRN

## 2019-09-01 MED ORDER — SODIUM CHLORIDE 0.9 % IV SOLN
8.0000 mg/h | INTRAVENOUS | Status: DC
  Administered 2019-09-01 – 2019-09-03 (×4): 8 mg/h via INTRAVENOUS
  Filled 2019-09-01 (×6): qty 80

## 2019-09-01 MED ORDER — SODIUM CHLORIDE 0.9 % IV BOLUS (SEPSIS)
250.0000 mL | Freq: Once | INTRAVENOUS | Status: AC
  Administered 2019-09-01: 20:00:00 250 mL via INTRAVENOUS

## 2019-09-01 MED ORDER — DOCUSATE SODIUM 100 MG PO CAPS: 100.0000 mg | ORAL_CAPSULE | Freq: Two times a day (BID) | ORAL | Status: DC | PRN

## 2019-09-01 MED ORDER — SODIUM CHLORIDE 0.9 % IV BOLUS (SEPSIS)
1000.0000 mL | Freq: Once | INTRAVENOUS | Status: AC
  Administered 2019-09-01: 20:00:00 1000 mL via INTRAVENOUS

## 2019-09-01 MED ORDER — ALBUTEROL SULFATE (2.5 MG/3ML) 0.083% IN NEBU: 2.5000 mg | INHALATION_SOLUTION | RESPIRATORY_TRACT | Status: DC | PRN

## 2019-09-01 MED ORDER — CALCIUM GLUCONATE-NACL 2-0.675 GM/100ML-% IV SOLN
2.0000 g | Freq: Once | INTRAVENOUS | Status: AC
  Administered 2019-09-01: 2000 mg via INTRAVENOUS
  Filled 2019-09-01: qty 100

## 2019-09-01 MED ORDER — MIDAZOLAM HCL 2 MG/2ML IJ SOLN
2.0000 mg | Freq: Once | INTRAMUSCULAR | Status: AC
  Administered 2019-09-01: 2 mg via INTRAVENOUS
  Filled 2019-09-01: qty 2

## 2019-09-01 MED ORDER — PROTHROMBIN COMPLEX CONC HUMAN 500 UNITS IV KIT
2531.0000 [IU] | PACK | Status: AC
  Administered 2019-09-01: 2531 [IU] via INTRAVENOUS
  Filled 2019-09-01: qty 531

## 2019-09-01 MED ORDER — SODIUM CHLORIDE 0.9 % IV SOLN
2.0000 g | INTRAVENOUS | Status: DC
  Administered 2019-09-02: 21:00:00 2 g via INTRAVENOUS
  Filled 2019-09-01: qty 2

## 2019-09-01 MED ORDER — SUCCINYLCHOLINE CHLORIDE 20 MG/ML IJ SOLN
INTRAMUSCULAR | Status: AC | PRN
  Administered 2019-09-01: 100 mg via INTRAVENOUS

## 2019-09-01 MED ORDER — FENTANYL 2500MCG IN NS 250ML (10MCG/ML) PREMIX INFUSION
0.0000 ug/h | INTRAVENOUS | Status: DC
  Administered 2019-09-01: 20:00:00 50 ug/h via INTRAVENOUS
  Administered 2019-09-02: 23:00:00 400 ug/h via INTRAVENOUS
  Administered 2019-09-02 – 2019-09-03 (×2): 150 ug/h via INTRAVENOUS
  Administered 2019-09-03: 04:00:00 400 ug/h via INTRAVENOUS
  Administered 2019-09-04: 07:00:00 150 ug/h via INTRAVENOUS
  Filled 2019-09-01 (×5): qty 250

## 2019-09-01 MED ORDER — VANCOMYCIN VARIABLE DOSE PER UNSTABLE RENAL FUNCTION (PHARMACIST DOSING): Status: DC

## 2019-09-01 MED ORDER — POLYETHYLENE GLYCOL 3350 17 G PO PACK: 17.0000 g | PACK | Freq: Every day | ORAL | Status: DC | PRN

## 2019-09-01 MED ORDER — VANCOMYCIN HCL IN DEXTROSE 1-5 GM/200ML-% IV SOLN
1000.0000 mg | Freq: Once | INTRAVENOUS | Status: AC
  Administered 2019-09-01: 22:00:00 1000 mg via INTRAVENOUS
  Filled 2019-09-01: qty 200

## 2019-09-01 MED ORDER — SODIUM CHLORIDE 0.9 % IV SOLN
2.0000 g | Freq: Once | INTRAVENOUS | Status: AC
  Administered 2019-09-01: 2 g via INTRAVENOUS
  Filled 2019-09-01: qty 2

## 2019-09-01 NOTE — Progress Notes (Signed)
Pharmacy Antibiotic Note  Tiffany Charles is a 86 y.o. female admitted on 09/01/2019 with sepsis.  Pharmacy has been consulted for Cefepime and Vancomycin dosing.   Height: 5\' 6"  (167.6 cm) Weight: 54 kg (119 lb 0.8 oz) IBW/kg (Calculated) : 59.3  No data recorded.  Recent Labs  Lab 09/01/19 1925 09/01/19 1928  WBC Not Measured  --   CREATININE 1.79*  --   LATICACIDVEN  --  >11.0*    Estimated Creatinine Clearance: 18.9 mL/min (A) (by C-G formula based on SCr of 1.79 mg/dL (H)).    Allergies  Allergen Reactions  . Codeine Nausea And Vomiting    Antimicrobials this admission: 4/8 Cefepime >>  4/8 Vancomycin >>   Dose adjustments this admission: N/a  Microbiology results: Pending   Plan:  - Cefepime 2g IV q24h - Vancomycin 1000 mg IV x 1 dose  - Will dose Vancomycin by random levels as renal function is currently unstable - Monitor patients renal function and urine output  - De-escalate ABX when appropriate   Thank you for allowing pharmacy to be a part of this patient's care.  Duanne Limerick PharmD. BCPS 09/01/2019 8:46 PM

## 2019-09-01 NOTE — Consult Note (Signed)
Consultation  Referring Provider:     Dr. Darl Householder ED  Primary Care Physician:  [name], MD Primary Gastroenterologist:        None known - unassigned Reason for Consultation:     UGI Bleed     Impression / Plan:   Massive Upper GI Bleed with shock, coagulopathy, on Xarelto Acute blood loss anemia Critically ill and ? If will survive. ? If there is a catastrophic primary event like primary gut ischemia as opposed to a primary GI bleed causing that secondary ischemia problem or something similar due to blood loss.   Agree w/ Wandalee Ferdinand and if appropriate and can determine timing of last Xarelto consider Andexxa Aggressive support w/ blood, pressors, IVF as you are PPI infusion  If this works and she survives we can consider EGD but I am afraid that is a long shot and she is not a candidate for therapeutic endoscopy at this time due to coagulopathy and other critical illness issues. Shge was a DNR in Jan 2021 but not March?  See what NOK thinks soon given gravity of this situation   Gatha Mayer, MD, [facility] 09/01/2019 9:00 PM          HPI:   Tiffany Charles is a 86 y.o. female brought to ED from [facility] today w/ altered mental status and coffee ground emesis. She was intubated and NG tube placed showing red blood - large amount and melena on rectal exam. Requiring Levophed. hgb unable to be measured. She is getting KJ centa, PPI infusion, IVF and blood products. No hx available. Last dc [facility] 3/20 after multiple PTCA L SFA, popliteal and tibial aa to treat left foot ischemia and non-healing ulcer. We do not know last dose of Xarelto - MAR reviewed  Past Medical History:  Diagnosis Date  . Angina pectoris   . Arthritis   . Chronic anticoagulation    Xarelto  . Chronic diastolic (congestive) heart failure ([place])   . COPD (chronic obstructive pulmonary disease) ([place])   . Coronary artery disease    a. s/p RCA stent 2008. b. cath 02/2009: 30% mLAD,  90% AV groove cx unchanged from prior, 50-60% RCA, EF 60% -> med rx.  . Depression   . Essential hypertension   . Hyperlipidemia   . Mild pulmonary hypertension ([place]) 09/17/2016  . Permanent atrial fibrillation ([place])   . Skin cancer Jan 2013   squamous cell- removed  . Tricuspid regurgitation 09/17/2016    Past Surgical History:  Procedure Laterality Date  .  CATARACTS REMOVED    . ABDOMINAL HYSTERECTOMY    . CARDIAC CATHETERIZATION  03/22/2009   Started medical therapy  . CARDIOVERSION  06/05/2009   3 attempts, last attempt successful  . CORONARY ANGIOPLASTY WITH STENT PLACEMENT  2009   RCA Vision stent  . HIP SURGERY    . LOWER EXTREMITY ANGIOGRAPHY Left 08/10/2019   Procedure: Lower Extremity Angiography;  Surgeon: Katha Cabal, MD;  Location: [facility];  Service: Cardiovascular;  Laterality: Left;  Marland Kitchen MELANOMA EXCISION  06/06/2011   Procedure: MELANOMA EXCISION;  Surgeon: Adin Hector, MD;  Location: [facility];  Service: General;  Laterality: Left;  re excision squamous cell lesion left chest wall     Family History  Problem Relation Age of Onset  . Stroke Mother   . Heart disease Mother   . Coronary artery disease Father   . Heart disease Father   .  Stroke Sister   . Heart disease Brother   . Stroke Sister   . Stroke Sister      Social History   Tobacco Use  . Smoking status: Former Smoker    Packs/day: 0.50  . Smokeless tobacco: Never Used  Substance Use Topics  . Alcohol use: No  . Drug use: No    Prior to Admission medications   Medication Sig Start Date End Date Taking? Authorizing Provider  acitretin (SORIATANE) 25 MG capsule Take 25 mg by mouth daily. 06/30/19   [provider]  ascorbic acid (VITAMIN C) 500 MG tablet Take 500 mg by mouth daily.    [provider]  aspirin EC 81 MG tablet Take 81 mg by mouth daily.     [provider]  atorvastatin (LIPITOR) 40 MG tablet Take 1 tablet (40 mg total) by mouth daily.  07/01/19   [name], MD  cholecalciferol (VITAMIN D3) 25 MCG (1000 UNIT) tablet Take 1,000 Units by mouth daily.    [provider]  metoprolol succinate (TOPROL-XL) 25 MG 24 hr tablet Take 1 tablet (25 mg total) by mouth daily. 05/05/19   Croitoru, Mihai, MD  Rivaroxaban (XARELTO) 15 MG TABS tablet Take 1 tablet (15 mg total) by mouth daily before supper. Patient taking differently: Take 15 mg by mouth daily after breakfast.  05/05/19   Croitoru, Mihai, MD  vitamin B-12 (CYANOCOBALAMIN) 1000 MCG tablet Take 1,000 mcg by mouth daily.    [provider]    Current Facility-Administered Medications  Medication Dose Route Frequency Provider Last Rate Last Admin  . 0.9 %  sodium chloride infusion (Manually program via Guardrails IV Fluids)   Intravenous Once Drenda Freeze, MD      . Derrill Memo ON 09/02/2019] ceFEPIme (MAXIPIME) 2 g in sodium chloride 0.9 % 100 mL IVPB  2 g Intravenous Q24H Duanne Limerick, RPH      . fentaNYL 2524mcg in NS 258mL (28mcg/ml) infusion-PREMIX  0-400 mcg/hr Intravenous Continuous Drenda Freeze, MD 5 mL/hr at 09/01/19 2015 50 mcg/hr at 09/01/19 2015  . metroNIDAZOLE (FLAGYL) IVPB 500 mg  500 mg Intravenous Once Drenda Freeze, MD      . norepinephrine (LEVOPHED) 4mg  in 256mL premix infusion  0-40 mcg/min Intravenous Titrated Drenda Freeze, MD   Stopped at 09/01/19 2018  . pantoprazole (PROTONIX) 80 mg in sodium chloride 0.9 % 100 mL (0.8 mg/mL) infusion  8 mg/hr Intravenous Continuous Drenda Freeze, MD 10 mL/hr at 09/01/19 2026 8 mg/hr at 09/01/19 2026  . pantoprazole (PROTONIX) 80 mg in sodium chloride 0.9 % 100 mL IVPB  80 mg Intravenous Once Drenda Freeze, MD 300 mL/hr at 09/01/19 2051 80 mg at 09/01/19 2051  . prothrombin complex conc human (KCENTRA) IVPB 2,531 Units  2,531 Units Intravenous STAT Drenda Freeze, MD 240 mL/hr at 09/01/19 2053 2,531 Units at 09/01/19 2053  . vancomycin (VANCOCIN) IVPB 1000 mg/200 mL premix   1,000 mg Intravenous Once Drenda Freeze, MD      . vancomycin variable dose per unstable renal function (pharmacist dosing)   Does not apply See admin instructions Duanne Limerick, [facility]       Current Outpatient Medications  Medication Sig Dispense Refill  . acitretin (SORIATANE) 25 MG capsule Take 25 mg by mouth daily.    Marland Kitchen ascorbic acid (VITAMIN C) 500 MG tablet Take 500 mg by mouth daily.    Marland Kitchen aspirin EC 81 MG tablet Take 81 mg by  mouth daily.     Marland Kitchen atorvastatin (LIPITOR) 40 MG tablet Take 1 tablet (40 mg total) by mouth daily. 90 tablet 1  . cholecalciferol (VITAMIN D3) 25 MCG (1000 UNIT) tablet Take 1,000 Units by mouth daily.    . metoprolol succinate (TOPROL-XL) 25 MG 24 hr tablet Take 1 tablet (25 mg total) by mouth daily. 90 tablet 0  . Rivaroxaban (XARELTO) 15 MG TABS tablet Take 1 tablet (15 mg total) by mouth daily before supper. (Patient taking differently: Take 15 mg by mouth daily after breakfast. ) 90 tablet 0  . vitamin B-12 (CYANOCOBALAMIN) 1000 MCG tablet Take 1,000 mcg by mouth daily.      Allergies as of 09/01/2019 - Review Complete 08/10/2019  Allergen Reaction Noted  . Codeine Nausea And Vomiting 05/29/2011     Review of Systems:    Unable to obtain - demented + intubated.       Physical Exam:  Vital signs in last 24 hours: SpO2:  [75 %] 75 % (04/08 1919) FiO2 (%):  [100 %] 100 % (04/08 1955) Weight:  [54 kg] 54 kg (04/08 2000)   Critically ill in shock Pale ETT in place NGt bloody suction Lungs cta ant cor distant abd quiet soft and nontedner Ext dusky and cool  Data Reviewed:   LAB RESULTS: Recent Labs    09/01/19 1925  WBC Not Measured  HGB Not Measured  HCT Not Measured  PLT Not Measured   BMET Recent Labs    09/01/19 1925  NA 141  K 4.5  CL 108  CO2 <7*  GLUCOSE 140*  BUN 66*  CREATININE 1.79*  CALCIUM 8.4*   LFT Recent Labs    09/01/19 1925  PROT 5.1*  ALBUMIN 3.0*  AST 68*  ALT 43  ALKPHOS 48  BILITOT 0.7    PT/INR Recent Labs    09/01/19 1925  LABPROT >90.0*  INR >10.0*       Thanks   LOS: 0 days   @Giles Currie  Simonne Maffucci, MD, [facility] @  09/01/2019, 8:57 PM

## 2019-09-01 NOTE — Progress Notes (Addendum)
RN notified of critical ABG results. Sample sent down to lab for a better reading due to PaCO2 being below detectable range for iSTAT. Lab notified of sample sent.

## 2019-09-01 NOTE — Significant Event (Signed)
Spoke with patients next of kin, son Geniene Escobedo.  Discussed patients current life threatening conditions including hemorrhagic shock, severe metabolic acidosis, acute liver failure, acute hypoxic respiratory failure.  Discussed interventions available, including full aggressive measures and shift to comfort focus.  Patient previously self-identified as DNR, son wanted further discussion of code status. Discussed CPR, risks and benefits, including ability to resuscitate, unknown neurological injury, pain, and patinets son [name] has indicated that Shanalee Selfe will be DNR - Full SOTO at this time.   He would like all aggressive measures including central line, arterial line, chest tubes, blood products, pressors and abx, however would not want resuscitation if she dies.   Care team updated.   Claris Pong P/CCM

## 2019-09-01 NOTE — ED Triage Notes (Signed)
Pt BIB [facility] from [facility] for eval of coffee ground emesis, altered mental status and found down on side of bed. Pt is currently anticoagulated on coumadin for afib RVR. Arrives afib rvr rate in the 140s, extremely pale, minimally responsive, 6LPM Pana w/ sats in the 80s.

## 2019-09-01 NOTE — Progress Notes (Signed)
Pt transported from ED31 to CT and to [id number] with no complications.

## 2019-09-01 NOTE — ED Provider Notes (Signed)
[facility] EMERGENCY DEPARTMENT Provider Note   CSN: [id number] Arrival date & time: 09/01/19  1914     History Chief Complaint  Patient presents with  . Fall  . Hematemesis    Tiffany Charles is a 86 y.o. female history of A. fib on Xarelto, hypertension, hyperlipidemia here presenting with hematemesis.  Patient was found altered by her bed in the nursing home.  Patient was noted to have coffee-ground emesis beside her bed as well.  Patient unable to give much history.  Patient was noted in rapid A. Fib per EMS.  Patient was noted to be hypoxic per EMS and room air sat was around 70%.  Patient was put on nonrebreather.  Patient has no DNR in the chart.   The history is provided by the nursing home and the EMS personnel.   Level V caveat- condition of patient      Past Medical History:  Diagnosis Date  . Angina pectoris   . Arthritis   . Chronic anticoagulation    Xarelto  . Chronic diastolic (congestive) heart failure ([employer])   . COPD (chronic obstructive pulmonary disease) ([place])   . Coronary artery disease    a. s/p RCA stent 2008. b. cath 02/2009: 30% mLAD, 90% AV groove cx unchanged from prior, 50-60% RCA, EF 60% -> med rx.  . Depression   . Essential hypertension   . Hyperlipidemia   . Mild pulmonary hypertension ([place]) 09/17/2016  . Permanent atrial fibrillation ([place])   . Skin cancer Jan 2013   squamous cell- removed  . Tricuspid regurgitation 09/17/2016    Patient Active Problem List   Diagnosis Date Noted  . Cellulitis 08/09/2019  . Cellulitis of left foot   . Left foot pain 07/22/2019  . Open wound of left foot 07/22/2019  . DNR (do not resuscitate) 06/08/2019  . Occipital stroke ([place]) 06/07/2019  . Squamous cell carcinoma, leg, unspecified laterality 06/08/2018  . Basal cell carcinoma (BCC) of nasal tip 06/08/2018  . Senile dementia without behavioral disturbance ([place]) 04/29/2017  . Frequent PVCs 09/17/2016  . Tricuspid regurgitation  09/17/2016  . Mild pulmonary hypertension ([place]) 09/17/2016  . Chronic diastolic (congestive) heart failure ([place])   . Essential hypertension   . Generalized OA 12/28/2014  . Routine general medical examination at a health care facility 11/09/2013  . Depression, major ([place]) 11/07/2013  . Chronic anticoagulation- Xarelto 12/28/2012  . Permanent atrial fibrillation 10/28/2012  . COPD - PFTs in June 2014 with MET test 10/28/2012  . CAD, RCA BMS '09, 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 low risk Myoview Feb 2013 10/28/2012  . Hyperlipidemia 10/28/2012    Past Surgical History:  Procedure Laterality Date  .  CATARACTS REMOVED    . ABDOMINAL HYSTERECTOMY    . CARDIAC CATHETERIZATION  03/22/2009   Started medical therapy  . CARDIOVERSION  06/05/2009   3 attempts, last attempt successful  . CORONARY ANGIOPLASTY WITH STENT PLACEMENT  2009   RCA Vision stent  . HIP SURGERY    . LOWER EXTREMITY ANGIOGRAPHY Left 08/10/2019   Procedure: Lower Extremity Angiography;  Surgeon: Katha Cabal, MD;  Location: [facility];  Service: Cardiovascular;  Laterality: Left;  Marland Kitchen MELANOMA EXCISION  06/06/2011   Procedure: MELANOMA EXCISION;  Surgeon: Adin Hector, MD;  Location: [facility];  Service: General;  Laterality: Left;  re excision squamous cell lesion left chest wall      OB History   No obstetric history on  file.     Family History  Problem Relation Age of Onset  . Stroke Mother   . Heart disease Mother   . Coronary artery disease Father   . Heart disease Father   . Stroke Sister   . Heart disease Brother   . Stroke Sister   . Stroke Sister     Social History   Tobacco Use  . Smoking status: Former Smoker    Packs/day: 0.50  . Smokeless tobacco: Never Used  Substance Use Topics  . Alcohol use: No  . Drug use: No    Home Medications Prior to Admission medications   Medication Sig Start Date End Date Taking? Authorizing Provider  acitretin (SORIATANE) 25 MG  capsule Take 25 mg by mouth daily. 06/30/19   [provider]  ascorbic acid (VITAMIN C) 500 MG tablet Take 500 mg by mouth daily.    [provider]  aspirin EC 81 MG tablet Take 81 mg by mouth daily.     [provider]  atorvastatin (LIPITOR) 40 MG tablet Take 1 tablet (40 mg total) by mouth daily. 07/01/19   [name], MD  cholecalciferol (VITAMIN D3) 25 MCG (1000 UNIT) tablet Take 1,000 Units by mouth daily.    [provider]  metoprolol succinate (TOPROL-XL) 25 MG 24 hr tablet Take 1 tablet (25 mg total) by mouth daily. 05/05/19   Croitoru, Mihai, MD  Rivaroxaban (XARELTO) 15 MG TABS tablet Take 1 tablet (15 mg total) by mouth daily before supper. Patient taking differently: Take 15 mg by mouth daily after breakfast.  05/05/19   Croitoru, Mihai, MD  vitamin B-12 (CYANOCOBALAMIN) 1000 MCG tablet Take 1,000 mcg by mouth daily.    [provider]    Allergies    Codeine  Review of Systems   Review of Systems  Unable to perform ROS: Mental status change  Gastrointestinal: Positive for vomiting.  All other systems reviewed and are negative.   Physical Exam Updated Vital Signs Ht _0  (1.676 m)   Wt 54 kg   BMI 19.21 kg/m   Physical Exam Vitals and nursing note reviewed.  Constitutional:      Comments: Altered, difficult to arouse   HENT:     Head:     Comments: Pale     Mouth/Throat:     Mouth: Mucous membranes are dry.  Eyes:     Comments: Pale, conjunctiva pale   Cardiovascular:     Rate and Rhythm: Tachycardia present. Rhythm irregular.  Pulmonary:     Effort: Pulmonary effort is normal.  Abdominal:     General: Abdomen is flat.     Palpations: Abdomen is soft.  Genitourinary:    Comments: occ- melena, guiac positive  Musculoskeletal:        General: Normal range of motion.     Cervical back: Normal range of motion.  Skin:    General: Skin is warm.     Capillary Refill: Capillary refill takes less than 2  seconds.  Neurological:     Comments: Altered, difficult to arouse, confused, moving all extremities   Psychiatric:        Mood and Affect: Mood normal.     ED Results / Procedures / Treatments   Labs (all labs ordered are listed, but only abnormal results are displayed) Labs Reviewed  LACTIC ACID, PLASMA - Abnormal; Notable for the following components:      Result Value   Lactic Acid, Venous >11.0 (*)  All other components within normal limits  POC OCCULT BLOOD, ED - Abnormal; Notable for the following components:   Fecal Occult Bld POSITIVE (*)    All other components within normal limits  CBG MONITORING, ED - Abnormal; Notable for the following components:   Glucose-Capillary 238 (*)    All other components within normal limits  CULTURE, BLOOD (ROUTINE X 2)  CULTURE, BLOOD (ROUTINE X 2)  URINE CULTURE  RESPIRATORY PANEL BY RT PCR (FLU A&B, COVID)  CBC WITH DIFFERENTIAL/PLATELET  COMPREHENSIVE METABOLIC PANEL  LACTIC ACID, PLASMA  PROTIME-INR  URINALYSIS, ROUTINE W REFLEX MICROSCOPIC  I-STAT CHEM 8, ED  CBG MONITORING, ED  PREPARE RBC (CROSSMATCH)  TYPE AND SCREEN  PREPARE RBC (CROSSMATCH)  ABO/RH    EKG EKG Interpretation  Date/Time:  Thursday September 01 2019 19:34:50 EDT Ventricular Rate:  138 PR Interval:    QRS Duration: 87 QT Interval:  331 QTC Calculation: 502 R Axis:   68 Text Interpretation: Atrial fibrillation Repolarization abnormality, prob rate related Prolonged QT interval rapid afib new since previous Confirmed by Wandra Arthurs [phone]) on 09/01/2019 7:39:10 PM   Radiology No results found.  Procedures Procedures (including critical care time)  CRITICAL CARE Performed by: Wandra Arthurs   Total critical care time: 50 minutes  Critical care time was exclusive of separately billable procedures and treating other patients.  Critical care was necessary to treat or prevent imminent or life-threatening deterioration.  Critical care was time  spent personally by me on the following activities: development of treatment plan with patient and/or surrogate as well as nursing, discussions with consultants, evaluation of patient's response to treatment, examination of patient, obtaining history from patient or surrogate, ordering and performing treatments and interventions, ordering and review of laboratory studies, ordering and review of radiographic studies, pulse oximetry and re-evaluation of patient's condition.  INTUBATION Performed by: Wandra Arthurs  Required items: required blood products, implants, devices, and special equipment available Patient identity confirmed: provided demographic data and hospital-assigned identification number Time out: Immediately prior to procedure a "time out" was called to verify the correct patient, procedure, equipment, support staff and site/side marked as required.  Indications: hypoxia   Intubation method: direct Laryngoscopy   Preoxygenation: BVM  Sedatives: 20 mg Etomidate Paralytic: 100 mg Succinylcholine  Tube Size: 7.5 cuffed  Post-procedure assessment: chest rise and ETCO2 monitor Breath sounds: equal and absent over the epigastrium Tube secured with: ETT holder Chest x-ray interpreted by radiologist and me.  Chest x-ray findings: endotracheal tube in appropriate position  Patient tolerated the procedure well with no immediate complications.     Medications Ordered in ED Medications  sodium chloride 0.9 % bolus 1,000 mL (1,000 mLs Intravenous New Bag/Given 09/01/19 2014)    And  sodium chloride 0.9 % bolus 500 mL (has no administration in time range)    And  sodium chloride 0.9 % bolus 250 mL (has no administration in time range)  ceFEPIme (MAXIPIME) 2 g in sodium chloride 0.9 % 100 mL IVPB (has no administration in time range)  metroNIDAZOLE (FLAGYL) IVPB 500 mg (has no administration in time range)  vancomycin (VANCOCIN) IVPB 1000 mg/200 mL premix (has no administration in  time range)  pantoprazole (PROTONIX) 80 mg in sodium chloride 0.9 % 100 mL IVPB (has no administration in time range)  pantoprazole (PROTONIX) 80 mg in sodium chloride 0.9 % 100 mL (0.8 mg/mL) infusion (has no administration in time range)  fentaNYL 252mg in NS 2560m(1029mml) infusion-PREMIX (has  no administration in time range)  norepinephrine (LEVOPHED) 46m in 2586mpremix infusion (0 mcg/min Intravenous Stopped 09/01/19 2018)  prothrombin complex conc human (KCENTRA) IVPB 50 Units/kg (has no administration in time range)  dextrose 50 % solution 50 mL (50 mLs Intravenous Given 09/01/19 1932)  0.9 %  sodium chloride infusion (Manually program via Guardrails IV Fluids) ( Intravenous New Bag/Given 09/01/19 2019)  0.9 %  sodium chloride infusion (Manually program via Guardrails IV Fluids) ( Intravenous New Bag/Given 09/01/19 2019)    ED Course  I have reviewed the triage vital signs and the nursing notes.  Pertinent labs & imaging results that were available during my care of the patient were reviewed by me and considered in my medical decision making (see chart for details).    MDM Rules/Calculators/A&P                      Porchia F KILEEN LANGEs a [age].o. female here presenting with altered mental status and fall.  Patient his hypotensive and altered.  Patient appears pale and I am concerned for massive upper GI bleed.  Patient is also in rapid A. fib and is on Xarelto.  Patient is altered and is hypoxic, will intubate for airway protection.  We will also order O- blood since patient appears very pale and likely has GI bleed.  We will also start on Protonix drip as well.  8:24 PM CBC unable to result since hemoglobin is so low.  Patient was intubated by me.  Ordered 1 unit of O- blood and ordered 2 more units of type and crossmatch blood.  Patient also started on Protonix drip.  Patient is intubated for hypoxia and appears to have aspiration pneumonia. Patient also started on broad-spectrum  antibiotics. I talked to Dr. GeCarlean Purlrom [facility].  He will see the patient but states that patient needs to have her Xarelto reversed.  I ordered Kcentra as well.  Patient will be admitted to critical care service.  Patient is also currently on Levophed drip as well.   9 pm Critical care at bedside to admit.   Final Clinical Impression(s) / ED Diagnoses Final diagnoses:  None    Rx / DC Orders ED Discharge Orders    None       YaDrenda FreezeMD 09/01/19 2319

## 2019-09-01 NOTE — ED Notes (Signed)
Per PA Hoffman, hold off on additional units at this time pending repeat CBC. At this time, pt has received 1 unit Emergency Release, 3 units crossmatched PRBC for total products of FOUR UNITS at this time.

## 2019-09-01 NOTE — ED Notes (Signed)
Recollect CBC sent down at 2100; contacted lab regarding testing. Reordered and lab tech to run

## 2019-09-01 NOTE — H&P (Addendum)
NAME:  Tiffany Charles, MRN:  [id number], DOB:  [birth year], LOS: 0 ADMISSION DATE:  09/01/2019, CONSULTATION DATE:  4/8 REFERRING MD:  Dr. Darl Householder, CHIEF COMPLAINT:  Hemorrhagic shock   Brief History   86 year old female on Xarelto for Atrial fibrillation admitted with hemorrhagic shock secondary to gastrointestinal hemorrhage.   History of present illness   Patient is encephalopathic and/or intubated. Therefore history has been obtained from chart review.  86 year old female with PMH as below, which is significant for Atrial fibrillation on Xarelto, COPD, CAD, and HTN. She was recently admitted to [facility] and treated for cellulitis with keflex and doxycycline. She presented to [facility] ED 4/8 after being found altered at SNF with coffee-ground emesis beside her bed. Upon arrival to the ED she was hypoxemic and hypotensive. Her mental status was poor and she ultimately required intubation. Laboratory evaluation significant for undetectable blood count and INR > 10. She was given [facility] in ED and blood transfusion was initiated. Gastroenterology has seen her in the ED and has deemed her to not presently be a candidate for endoscopy due to acuity of her condition. PCCM asked to admit.   Past Medical History   has a past medical history of Angina pectoris, Arthritis, Chronic anticoagulation, Chronic diastolic (congestive) heart failure ([place]), COPD (chronic obstructive pulmonary disease) ([place]), Coronary artery disease, Depression, Essential hypertension, Hyperlipidemia, Mild pulmonary hypertension ([place]) (09/17/2016), Permanent atrial fibrillation [facility]), Skin cancer (Jan 2013), and Tricuspid regurgitation (09/17/2016).  Significant Hospital Events   4/8 admit  Consults:  GI  Procedures:  ETT 4/8 >  Significant Diagnostic Tests:  CTA abdomen/pelvis 4/8 >  Micro Data:  Blood Cx 4/8 > Urine 4/8 >  Antimicrobials:  Flagyl 4/8  Cefepime 4/8 > Vancomycin 4/8>  Interim history/subjective:     Objective   Height 5\' 6"  (1.676 m), weight 54 kg, SpO2 (!) 75 %.    Vent Mode: PRVC FiO2 (%):  [100 %] 100 % Set Rate:  [22 bmp] 22 bmp Vt Set:  [470 mL] 470 mL PEEP:  [8 cmH20] 8 cmH20 Plateau Pressure:  [19 cmH20] 19 cmH20   Intake/Output Summary (Last 24 hours) at 09/01/2019 2106 Last data filed at 09/01/2019 2036 Gross per 24 hour  Intake 250 ml  Output --  Net 250 ml   Filed Weights   09/01/19 2000  Weight: 54 kg    Examination: General: frail elderly female in NAD on vent HENT: [place]/AT, PERRL, no JVD Lungs: Clear bilateral breath sounds Cardiovascular: Tachy, IRIR, no MRG Abdomen: Soft, non-tender, non-distended Extremities: No acute deformity Neuro: Sedated  Resolved Hospital Problem list    Assessment & Plan:   Hemorrhagic shock: secondary to GIB. Was briefly on levophed, but with blood transfusion is now off. KCentra given in ED.  - Admit to ICU - Aggressive blood product resuscitation - 7 unit PRBC ordered by ED staff - Give 2 units platelets  - Give calcium - Give Cryo - CBC every 6 hours - Pressors as needed to keep MAP 65 - Trend coags - Hold Xarelto - ABX until sepsis ruled out OK. Cultures pending. Low threshold to DC.   Gastrointestinal hemorrhage: suspect upper GI source considering coffee-ground emesis and black stools.  - GI consulted - Protonix infusion - CT chest abd pelvis to look for arterial bleed for possible IR embolization  Acute hypoxemic respiratory failure: Questionable COPD history - Full vent support - CXR for tube placement pending - VAP bundle - PRN sedation  only.  - Not currently a candidate for SBT  - PRN nebs  AKI - Supportive care - Repeat BMP  Atrial fib, RVR - Tele - Hold Xarelto - treat shock  Lactic acidosis:  - treat shock - Ensure lactic clearing - Follow chemistry  Best practice:  Diet: NPO Pain/Anxiety/Delirium protocol (if indicated): PAD VAP protocol (if indicated): per protocol DVT  prophylaxis: none GI prophylaxis: PPI drip Glucose control: SSI Mobility: BR Code Status: DNR Family Communication: Son updated by Dr. Guerry Bruin.  Disposition: ICU  Labs   CBC: Recent Labs  Lab 09/01/19 1925  WBC Not Measured  NEUTROABS Not Measured  HGB Not Measured  HCT Not Measured  MCV Not Measured  PLT Not Measured    Basic Metabolic Panel: Recent Labs  Lab 09/01/19 1925  NA 141  K 4.5  CL 108  CO2 <7*  GLUCOSE 140*  BUN 66*  CREATININE 1.79*  CALCIUM 8.4*   GFR: Estimated Creatinine Clearance: 18.9 mL/min (A) (by C-G formula based on SCr of 1.79 mg/dL (H)). Recent Labs  Lab 09/01/19 1925 09/01/19 1928  WBC Not Measured  --   LATICACIDVEN  --  >11.0*    Liver Function Tests: Recent Labs  Lab 09/01/19 1925  AST 68*  ALT 43  ALKPHOS 48  BILITOT 0.7  PROT 5.1*  ALBUMIN 3.0*   No results for input(s): LIPASE, AMYLASE in the last 168 hours. No results for input(s): AMMONIA in the last 168 hours.  ABG No results found for: PHART, PCO2ART, PO2ART, HCO3, TCO2, ACIDBASEDEF, O2SAT   Coagulation Profile: Recent Labs  Lab 09/01/19 1925  INR >10.0*    Cardiac Enzymes: No results for input(s): CKTOTAL, CKMB, CKMBINDEX, TROPONINI in the last 168 hours.  HbA1C: Hgb A1c MFr Bld  Date/Time Value Ref Range Status  06/08/2019 09:08 AM 5.0 4.8 - 5.6 % Final    Comment:    (NOTE) Pre diabetes:          5.7%-6.4% Diabetes:              >6.4% Glycemic control for   <7.0% adults with diabetes     CBG: Recent Labs  Lab 09/01/19 1919 09/01/19 2011  GLUCAP 71 238*    Review of Systems:   Unable as patient is encephalopathic and intubated.   Past Medical History  She,  has a past medical history of Angina pectoris, Arthritis, Chronic anticoagulation, Chronic diastolic (congestive) heart failure ([place]), COPD (chronic obstructive pulmonary disease) ([place]), Coronary artery disease, Depression, Essential hypertension, Hyperlipidemia, Mild pulmonary  hypertension ([place]) (09/17/2016), Permanent atrial fibrillation [facility]), Skin cancer (Jan 2013), and Tricuspid regurgitation (09/17/2016).   Surgical History    Past Surgical History:  Procedure Laterality Date  .  CATARACTS REMOVED    . ABDOMINAL HYSTERECTOMY    . CARDIAC CATHETERIZATION  03/22/2009   Started medical therapy  . CARDIOVERSION  06/05/2009   3 attempts, last attempt successful  . CORONARY ANGIOPLASTY WITH STENT PLACEMENT  2009   RCA Vision stent  . HIP SURGERY    . LOWER EXTREMITY ANGIOGRAPHY Left 08/10/2019   Procedure: Lower Extremity Angiography;  Surgeon: Katha Cabal, MD;  Location: [facility];  Service: Cardiovascular;  Laterality: Left;  Marland Kitchen MELANOMA EXCISION  06/06/2011   Procedure: MELANOMA EXCISION;  Surgeon: Adin Hector, MD;  Location: [facility];  Service: General;  Laterality: Left;  re excision squamous cell lesion left chest wall      Social History   reports that  she has quit smoking. She smoked 0.50 packs per day. She has never used smokeless tobacco. She reports that she does not drink alcohol or use drugs.   Family History   Her family history includes Coronary artery disease in her father; Heart disease in her brother, father, and mother; Stroke in her mother, sister, sister, and sister.   Allergies Allergies  Allergen Reactions  . Codeine Nausea And Vomiting     Home Medications  Prior to Admission medications   Medication Sig Start Date End Date Taking? Authorizing Provider  acitretin (SORIATANE) 25 MG capsule Take 25 mg by mouth daily. 06/30/19   [provider]  ascorbic acid (VITAMIN C) 500 MG tablet Take 500 mg by mouth daily.    [provider]  aspirin EC 81 MG tablet Take 81 mg by mouth daily.     [provider]  atorvastatin (LIPITOR) 40 MG tablet Take 1 tablet (40 mg total) by mouth daily. 07/01/19   [name], MD  cholecalciferol (VITAMIN D3) 25 MCG (1000 UNIT) tablet Take 1,000 Units by mouth  daily.    [provider]  metoprolol succinate (TOPROL-XL) 25 MG 24 hr tablet Take 1 tablet (25 mg total) by mouth daily. 05/05/19   Croitoru, Mihai, MD  Rivaroxaban (XARELTO) 15 MG TABS tablet Take 1 tablet (15 mg total) by mouth daily before supper. Patient taking differently: Take 15 mg by mouth daily after breakfast.  05/05/19   Croitoru, Mihai, MD  vitamin B-12 (CYANOCOBALAMIN) 1000 MCG tablet Take 1,000 mcg by mouth daily.    [provider]     Critical care time: 50 mins     Georgann Housekeeper, AGACNP-BC [place] for personal pager PCCM on call pager [phone]  09/01/2019 9:35 PM

## 2019-09-01 NOTE — Progress Notes (Signed)
Critical ABG results given to NP.

## 2019-09-01 NOTE — Progress Notes (Signed)
ETT advanced 4 cm.  ETT now at 25 at the lips

## 2019-09-02 ENCOUNTER — Inpatient Hospital Stay (HOSPITAL_COMMUNITY): Payer: Medicare HMO

## 2019-09-02 DIAGNOSIS — K922 Gastrointestinal hemorrhage, unspecified: Secondary | ICD-10-CM | POA: Diagnosis present

## 2019-09-02 DIAGNOSIS — R0902 Hypoxemia: Secondary | ICD-10-CM | POA: Diagnosis not present

## 2019-09-02 DIAGNOSIS — I2510 Atherosclerotic heart disease of native coronary artery without angina pectoris: Secondary | ICD-10-CM | POA: Diagnosis not present

## 2019-09-02 DIAGNOSIS — I361 Nonrheumatic tricuspid (valve) insufficiency: Secondary | ICD-10-CM

## 2019-09-02 DIAGNOSIS — Z4682 Encounter for fitting and adjustment of non-vascular catheter: Secondary | ICD-10-CM | POA: Diagnosis not present

## 2019-09-02 DIAGNOSIS — I340 Nonrheumatic mitral (valve) insufficiency: Secondary | ICD-10-CM

## 2019-09-02 DIAGNOSIS — J90 Pleural effusion, not elsewhere classified: Secondary | ICD-10-CM | POA: Diagnosis not present

## 2019-09-02 DIAGNOSIS — Z452 Encounter for adjustment and management of vascular access device: Secondary | ICD-10-CM | POA: Diagnosis not present

## 2019-09-02 DIAGNOSIS — Z7901 Long term (current) use of anticoagulants: Secondary | ICD-10-CM

## 2019-09-02 DIAGNOSIS — J9601 Acute respiratory failure with hypoxia: Secondary | ICD-10-CM | POA: Diagnosis not present

## 2019-09-02 DIAGNOSIS — R578 Other shock: Secondary | ICD-10-CM | POA: Diagnosis not present

## 2019-09-02 LAB — BASIC METABOLIC PANEL
Anion gap: 15 (ref 5–15)
BUN: 61 mg/dL — ABNORMAL HIGH (ref 8–23)
CO2: 17 mmol/L — ABNORMAL LOW (ref 22–32)
Calcium: 7.8 mg/dL — ABNORMAL LOW (ref 8.9–10.3)
Chloride: 108 mmol/L (ref 98–111)
Creatinine, Ser: 1.63 mg/dL — ABNORMAL HIGH (ref 0.44–1.00)
GFR calc Af Amer: 32 mL/min — ABNORMAL LOW (ref >60)
GFR calc non Af Amer: 28 mL/min — ABNORMAL LOW (ref >60)
Glucose, Bld: 110 mg/dL — ABNORMAL HIGH (ref 70–99)
Potassium: 4.8 mmol/L (ref 3.5–5.1)
Sodium: 140 mmol/L (ref 135–145)

## 2019-09-02 LAB — GLUCOSE, CAPILLARY
Glucose-Capillary: 119 mg/dL — ABNORMAL HIGH (ref 70–99)
Glucose-Capillary: 136 mg/dL — ABNORMAL HIGH (ref 70–99)
Glucose-Capillary: 184 mg/dL — ABNORMAL HIGH (ref 70–99)
Glucose-Capillary: 36 mg/dL — CL (ref 70–99)
Glucose-Capillary: 62 mg/dL — ABNORMAL LOW (ref 70–99)
Glucose-Capillary: 62 mg/dL — ABNORMAL LOW (ref 70–99)
Glucose-Capillary: 73 mg/dL (ref 70–99)
Glucose-Capillary: 76 mg/dL (ref 70–99)
Glucose-Capillary: 78 mg/dL (ref 70–99)

## 2019-09-02 LAB — CBC
HCT: 34.5 % — ABNORMAL LOW (ref 36.0–46.0)
HCT: 41.8 % (ref 36.0–46.0)
HCT: 43.1 % (ref 36.0–46.0)
HCT: 46.3 % — ABNORMAL HIGH (ref 36.0–46.0)
Hemoglobin: 11 g/dL — ABNORMAL LOW (ref 12.0–15.0)
Hemoglobin: 14.4 g/dL (ref 12.0–15.0)
Hemoglobin: 14.7 g/dL (ref 12.0–15.0)
Hemoglobin: 15.2 g/dL — ABNORMAL HIGH (ref 12.0–15.0)
MCH: 28.9 pg (ref 26.0–34.0)
MCH: 29.1 pg (ref 26.0–34.0)
MCH: 29.2 pg (ref 26.0–34.0)
MCH: 29.2 pg (ref 26.0–34.0)
MCHC: 31.9 g/dL (ref 30.0–36.0)
MCHC: 32.8 g/dL (ref 30.0–36.0)
MCHC: 34.1 g/dL (ref 30.0–36.0)
MCHC: 34.4 g/dL (ref 30.0–36.0)
MCV: 84.8 fL (ref 80.0–100.0)
MCV: 85.7 fL (ref 80.0–100.0)
MCV: 88.7 fL (ref 80.0–100.0)
MCV: 90.8 fL (ref 80.0–100.0)
Platelets: 235 10*3/uL (ref 150–400)
Platelets: 252 10*3/uL (ref 150–400)
Platelets: 269 10*3/uL (ref 150–400)
Platelets: 305 10*3/uL (ref 150–400)
RBC: 3.8 MIL/uL — ABNORMAL LOW (ref 3.87–5.11)
RBC: 4.93 MIL/uL (ref 3.87–5.11)
RBC: 5.03 MIL/uL (ref 3.87–5.11)
RBC: 5.22 MIL/uL — ABNORMAL HIGH (ref 3.87–5.11)
RDW: 15.2 % (ref 11.5–15.5)
RDW: 16.1 % — ABNORMAL HIGH (ref 11.5–15.5)
RDW: 16.6 % — ABNORMAL HIGH (ref 11.5–15.5)
RDW: 16.8 % — ABNORMAL HIGH (ref 11.5–15.5)
WBC: 13.5 10*3/uL — ABNORMAL HIGH (ref 4.0–10.5)
WBC: 15 10*3/uL — ABNORMAL HIGH (ref 4.0–10.5)
WBC: 15.1 10*3/uL — ABNORMAL HIGH (ref 4.0–10.5)
WBC: 17.1 10*3/uL — ABNORMAL HIGH (ref 4.0–10.5)
nRBC: 10.3 % — ABNORMAL HIGH (ref 0.0–0.2)
nRBC: 5.9 % — ABNORMAL HIGH (ref 0.0–0.2)
nRBC: 6.4 % — ABNORMAL HIGH (ref 0.0–0.2)
nRBC: 8.5 % — ABNORMAL HIGH (ref 0.0–0.2)

## 2019-09-02 LAB — POCT I-STAT 7, (LYTES, BLD GAS, ICA,H+H)
Acid-base deficit: 11 mmol/L — ABNORMAL HIGH (ref 0.0–2.0)
Acid-base deficit: 16 mmol/L — ABNORMAL HIGH (ref 0.0–2.0)
Bicarbonate: 10.5 mmol/L — ABNORMAL LOW (ref 20.0–28.0)
Bicarbonate: 15.4 mmol/L — ABNORMAL LOW (ref 20.0–28.0)
Calcium, Ion: 1.14 mmol/L — ABNORMAL LOW (ref 1.15–1.40)
Calcium, Ion: 1.2 mmol/L (ref 1.15–1.40)
HCT: 32 % — ABNORMAL LOW (ref 36.0–46.0)
HCT: 45 % (ref 36.0–46.0)
Hemoglobin: 10.9 g/dL — ABNORMAL LOW (ref 12.0–15.0)
Hemoglobin: 15.3 g/dL — ABNORMAL HIGH (ref 12.0–15.0)
O2 Saturation: 99 %
O2 Saturation: 99 %
Patient temperature: 98.6
Potassium: 4.3 mmol/L (ref 3.5–5.1)
Potassium: 4.5 mmol/L (ref 3.5–5.1)
Sodium: 139 mmol/L (ref 135–145)
Sodium: 141 mmol/L (ref 135–145)
TCO2: 11 mmol/L — ABNORMAL LOW (ref 22–32)
TCO2: 16 mmol/L — ABNORMAL LOW (ref 22–32)
pCO2 arterial: 27.8 mmHg — ABNORMAL LOW (ref 32.0–48.0)
pCO2 arterial: 35.9 mmHg (ref 32.0–48.0)
pH, Arterial: 7.186 — CL (ref 7.350–7.450)
pH, Arterial: 7.239 — ABNORMAL LOW (ref 7.350–7.450)
pO2, Arterial: 143 mmHg — ABNORMAL HIGH (ref 83.0–108.0)
pO2, Arterial: 152 mmHg — ABNORMAL HIGH (ref 83.0–108.0)

## 2019-09-02 LAB — APTT: aPTT: 46 seconds — ABNORMAL HIGH (ref 24–36)

## 2019-09-02 LAB — LACTIC ACID, PLASMA
Lactic Acid, Venous: 7.4 mmol/L (ref 0.5–1.9)
Lactic Acid, Venous: 3.6 mmol/L (ref 0.5–1.9)
Lactic Acid, Venous: 2.6 mmol/L (ref 0.5–1.9)
Lactic Acid, Venous: 2 mmol/L (ref 0.5–1.9)

## 2019-09-02 LAB — DIC (DISSEMINATED INTRAVASCULAR COAGULATION)PANEL
D-Dimer, Quant: 0.39 ug/mL-FEU (ref 0.00–0.50)
Fibrinogen: 216 mg/dL (ref 210–475)
INR: 4.1 (ref 0.8–1.2)
Platelets: 240 10*3/uL (ref 150–400)
Prothrombin Time: 39.6 seconds — ABNORMAL HIGH (ref 11.4–15.2)
Smear Review: NONE SEEN
aPTT: 44 seconds — ABNORMAL HIGH (ref 24–36)

## 2019-09-02 LAB — TROPONIN I (HIGH SENSITIVITY)
Troponin I (High Sensitivity): 5027 ng/L (ref <18)
Troponin I (High Sensitivity): 9549 ng/L (ref <18)
Troponin I (High Sensitivity): 15097 ng/L (ref <18)

## 2019-09-02 LAB — HEPATIC FUNCTION PANEL
ALT: 169 U/L — ABNORMAL HIGH (ref 0–44)
AST: 174 U/L — ABNORMAL HIGH (ref 15–41)
Albumin: 2.5 g/dL — ABNORMAL LOW (ref 3.5–5.0)
Alkaline Phosphatase: 41 U/L (ref 38–126)
Bilirubin, Direct: 0.7 mg/dL — ABNORMAL HIGH (ref 0.0–0.2)
Indirect Bilirubin: 1.1 mg/dL — ABNORMAL HIGH (ref 0.3–0.9)
Total Bilirubin: 1.8 mg/dL — ABNORMAL HIGH (ref 0.3–1.2)
Total Protein: 4.2 g/dL — ABNORMAL LOW (ref 6.5–8.1)

## 2019-09-02 LAB — PHOSPHORUS: Phosphorus: 8.6 mg/dL — ABNORMAL HIGH (ref 2.5–4.6)

## 2019-09-02 LAB — ECHOCARDIOGRAM COMPLETE
Height: 66 in
Weight: 2186.96 oz

## 2019-09-02 LAB — PROTIME-INR
INR: 3.3 — ABNORMAL HIGH (ref 0.8–1.2)
INR: 4.4 (ref 0.8–1.2)
Prothrombin Time: 33.9 seconds — ABNORMAL HIGH (ref 11.4–15.2)
Prothrombin Time: 41.9 seconds — ABNORMAL HIGH (ref 11.4–15.2)

## 2019-09-02 LAB — LIPASE, BLOOD: Lipase: 33 U/L (ref 11–51)

## 2019-09-02 LAB — MRSA PCR SCREENING: MRSA by PCR: NEGATIVE

## 2019-09-02 LAB — LACTATE DEHYDROGENASE: LDH: 510 U/L — ABNORMAL HIGH (ref 98–192)

## 2019-09-02 LAB — FIBRINOGEN: Fibrinogen: 218 mg/dL (ref 210–475)

## 2019-09-02 LAB — MAGNESIUM: Magnesium: 2 mg/dL (ref 1.7–2.4)

## 2019-09-02 MED ORDER — MIDAZOLAM HCL 2 MG/2ML IJ SOLN
INTRAMUSCULAR | Status: AC
  Filled 2019-09-02: qty 2

## 2019-09-02 MED ORDER — POLYETHYLENE GLYCOL 3350 17 G PO PACK: 17.0000 g | PACK | Freq: Every day | ORAL | Status: DC | PRN

## 2019-09-02 MED ORDER — DEXTROSE 50 % IV SOLN
INTRAVENOUS | Status: AC
  Filled 2019-09-02: qty 50

## 2019-09-02 MED ORDER — ORAL CARE MOUTH RINSE
15.0000 mL | OROMUCOSAL | Status: DC
  Administered 2019-09-02 – 2019-09-04 (×23): 15 mL via OROMUCOSAL

## 2019-09-02 MED ORDER — DEXTROSE 50 % IV SOLN
25.0000 mL | Freq: Once | INTRAVENOUS | Status: AC
  Administered 2019-09-02: 25 mL via INTRAVENOUS

## 2019-09-02 MED ORDER — DEXTROSE 50 % IV SOLN
INTRAVENOUS | Status: AC
  Administered 2019-09-02: 12:00:00 25 mL via INTRAVENOUS
  Filled 2019-09-02: qty 50

## 2019-09-02 MED ORDER — ALBUMIN HUMAN 5 % IV SOLN
12.5000 g | Freq: Once | INTRAVENOUS | Status: AC
  Administered 2019-09-02: 12.5 g via INTRAVENOUS
  Filled 2019-09-02: qty 250

## 2019-09-02 MED ORDER — DEXTROSE 50 % IV SOLN: 25.0000 mL | Freq: Once | INTRAVENOUS | Status: AC

## 2019-09-02 MED ORDER — CHLORHEXIDINE GLUCONATE 0.12% ORAL RINSE (MEDLINE KIT)
15.0000 mL | Freq: Two times a day (BID) | OROMUCOSAL | Status: DC
  Administered 2019-09-02 – 2019-09-04 (×6): 15 mL via OROMUCOSAL

## 2019-09-02 MED ORDER — DEXTROSE 10 % IV SOLN: INTRAVENOUS | Status: DC

## 2019-09-02 MED ORDER — MIDAZOLAM HCL 2 MG/2ML IJ SOLN
1.0000 mg | Freq: Once | INTRAMUSCULAR | Status: AC
  Administered 2019-09-02: 12:00:00 1 mg via INTRAVENOUS

## 2019-09-02 MED ORDER — CHLORHEXIDINE GLUCONATE CLOTH 2 % EX PADS
6.0000 | MEDICATED_PAD | Freq: Every day | CUTANEOUS | Status: DC
  Administered 2019-09-02 – 2019-09-06 (×5): 6 via TOPICAL

## 2019-09-02 MED ORDER — METOPROLOL TARTRATE 5 MG/5ML IV SOLN
2.5000 mg | INTRAVENOUS | Status: AC | PRN
  Administered 2019-09-02: 06:00:00 5 mg via INTRAVENOUS
  Administered 2019-09-05: 10:00:00 2.5 mg via INTRAVENOUS
  Filled 2019-09-02 (×2): qty 5

## 2019-09-02 NOTE — Progress Notes (Signed)
last unit of blood that was in cooler that came up with patient from ED to ICU is being administered  currently. Waiting for more blood products.

## 2019-09-02 NOTE — Progress Notes (Signed)
eLink Physician-Brief Progress Note Patient Name: Tiffany Charles DOB: 03-03-1930 MRN: [id number]   Date of Service  09/02/2019  HPI/Events of Note  Troponin 5.027-likely type 2 ischemia secondary to demand.  eICU Interventions  Trend troponin, a.m. ECHO, Heparin and beta blocker contraindicated by GI bleeding and shock, consult cardiology in a.m.        [name] 09/02/2019, 2:26 AM

## 2019-09-02 NOTE — Procedures (Addendum)
Central Venous Catheter Insertion Procedure Note Tiffany Charles [id number] [birth year]  Procedure: Insertion of Central Venous Catheter Indications: Assessment of intravascular volume, Drug and/or fluid administration and Frequent blood sampling  Procedure Details Consent: Risks of procedure as well as the alternatives and risks of each were explained to the (patient/caregiver).  Consent for procedure obtained. Time Out: Verified patient identification, verified procedure, site/side was marked, verified correct patient position, special equipment/implants available, medications/allergies/relevent history reviewed, required imaging and test results available.  Performed  Maximum sterile technique was used including antiseptics, cap, gloves, gown, hand hygiene, mask and sheet. Skin prep: Chlorhexidine; local anesthetic administered A antimicrobial bonded/coated triple lumen catheter was placed in the left internal jugular vein using the Seldinger technique. Ultrasound guidance used.Yes.   Catheter placed to 20 cm. Blood aspirated via all 3 ports and then flushed x 3. Line sutured x 2 and dressing applied.  Evaluation Blood flow good Complications: No apparent complications Patient did tolerate procedure well. Chest X-ray ordered to verify placement.  CXR: pending.    [name] Mikenna Bunkley ACNP Acute Care Nurse Practitioner [facility] Pulmonary/Critical Care Please consult Amion 09/02/2019, 12:21 PM

## 2019-09-02 NOTE — Progress Notes (Signed)
Patient bilateral toes purple and cool to touch pulse weak doppler

## 2019-09-02 NOTE — Progress Notes (Signed)
Progress Note   Subjective  Overnight events reviewed. Spoke with nursing - has not had much output from OG or per rectum, one small dark BM this AM. NG initially showed red blood last night, but only coffee grounds noted this AM, not a significant amount.    Objective   Vital signs in last 24 hours: Temp:  [89.6 F (32 C)-97.5 F (36.4 C)] 97.5 F (36.4 C) (04/09 0800) Pulse Rate:  [30-152] 103 (04/09 0754) Resp:  [10-36] 30 (04/09 0800) BP: (61-138)/(46-93) 95/75 (04/09 0800) SpO2:  [56 %-100 %] 69 % (04/09 0700) FiO2 (%):  [50 %-100 %] 50 % (04/09 0754) Weight:  [54 kg-62 kg] 62 kg (04/09 0431)   General:    white female intubated Heart:  AF with RVR - rates low 100s to 120s Abdomen:  Soft, nontender and nondistended. Normal bowel sounds. Extremities:  Without edema. Neurologic:  Intubated, agitated  Intake/Output from previous day: 04/08 0701 - 04/09 0700 In: 4517 [I.V.:556.4; Blood:1173.3; IV Piggyback:2787.3] Out: 270 [Urine:120; Emesis/NG output:150] Intake/Output this shift: Total I/O In: 167 [I.V.:93; Blood:74] Out: 0   Lab Results: Recent Labs    09/01/19 2143 09/01/19 2143 09/01/19 2356 09/02/19 0013 09/02/19 0030 09/02/19 0425  WBC 10.7*  --  15.0*  --   --  17.1*  HGB 5.5*   < > 11.0* 10.9*  --  15.2*  HCT 20.2*   < > 34.5* 32.0*  --  46.3*  PLT 295   < > 235  --  240 305   < > = values in this interval not displayed.   BMET Recent Labs    09/01/19 1925 09/02/19 0013 09/02/19 0424  NA 141 139 140  K 4.5 4.3 4.8  CL 108  --  108  CO2 <7*  --  17*  GLUCOSE 140*  --  110*  BUN 66*  --  61*  CREATININE 1.79*  --  1.63*  CALCIUM 8.4*  --  7.8*   LFT Recent Labs    09/02/19 0030  PROT 4.2*  ALBUMIN 2.5*  AST 174*  ALT 169*  ALKPHOS 41  BILITOT 1.8*  BILIDIR 0.7*  IBILI 1.1*   PT/INR Recent Labs    09/02/19 0030 09/02/19 0424  LABPROT 39.6* 33.9*  INR 4.1* 3.3*    Studies/Results: CT Head Wo Contrast  Result Date:  09/01/2019 CLINICAL DATA:  Status post trauma. EXAM: CT HEAD WITHOUT CONTRAST TECHNIQUE: Contiguous axial images were obtained from the base of the skull through the vertex without intravenous contrast. COMPARISON:  June 07, 2019 FINDINGS: Brain: There is moderate severity cerebral atrophy with widening of the extra-axial spaces and ventricular dilatation. There are areas of decreased attenuation within the white matter tracts of the supratentorial brain, consistent with microvascular disease changes. Vascular: No hyperdense vessel or unexpected calcification. Skull: Normal. Negative for fracture or focal lesion. Sinuses/Orbits: There is mild bilateral ethmoid sinus mucosal thickening. Other: Endotracheal and nasogastric tubes are seen. IMPRESSION: 1. No acute intracranial abnormality. 2. Moderate severity cerebral atrophy and microvascular disease changes of the supratentorial brain. 3. Mild bilateral ethmoid sinus disease. Electronically Signed   By: Virgina Norfolk M.D.   On: 09/01/2019 23:46   CT Cervical Spine Wo Contrast  Result Date: 09/01/2019 CLINICAL DATA:  86 year old female with trauma. EXAM: CT CERVICAL SPINE WITHOUT CONTRAST TECHNIQUE: Multidetector CT imaging of the cervical spine was performed without intravenous contrast. Multiplanar CT image reconstructions were also generated. COMPARISON:  None. FINDINGS:  Alignment: No acute subluxation. Skull base and vertebrae: No acute fracture.  Osteopenia. Soft tissues and spinal canal: No prevertebral fluid or swelling. No visible canal hematoma. Disc levels:  Multilevel degenerative changes. Upper chest: Emphysema. Diffuse interstitial and interlobular septal thickening, likely edema. Clinical correlation is recommended. Other: An endotracheal and an enteric tube are partially visualized. The endotracheal tube is above the carina. Bilateral carotid bulb calcified plaques. A venous line is noted over the right supraclavicular region. IMPRESSION: No  acute/traumatic cervical spine pathology. Electronically Signed   By: Anner Crete M.D.   On: 09/01/2019 23:44   DG Chest Port 1 View  Result Date: 09/02/2019 CLINICAL DATA:  Intubated. EXAM: PORTABLE CHEST 1 VIEW COMPARISON:  One-view chest x-ray 09/01/2019 FINDINGS: Heart size is normal scratched at the heart is enlarged. Endotracheal tube is stable. Side port of the NG tube is just beyond the GE junction. Mild pulmonary vascular congestion is present. Bibasilar airspace disease and small effusions have increased slightly. IMPRESSION: 1. Stable cardiomegaly and mild pulmonary vascular congestion. 2. Increasing bibasilar airspace disease and small effusions. While this likely reflects atelectasis, infection is not excluded. Electronically Signed   By: San Morelle M.D.   On: 09/02/2019 07:52   DG Chest Port 1 View  Result Date: 09/01/2019 CLINICAL DATA:  Status post intubation. EXAM: PORTABLE CHEST 1 VIEW COMPARISON:  June 13, 2019 FINDINGS: An endotracheal tube is seen with its distal tip approximately 8.0 cm from the carina. A nasogastric tube is noted with its distal end extending below the level of the diaphragm. Mild, chronic appearing increased lung markings are seen bilaterally. There is no evidence of a pleural effusion or pneumothorax. The cardiac silhouette is markedly enlarged and unchanged in size. The visualized skeletal structures are unremarkable. IMPRESSION: 1. Endotracheal tube and nasogastric tube positioning, as described above. 2. Chronic appearing increased lung markings without evidence of acute or active cardiopulmonary disease. Electronically Signed   By: Virgina Norfolk M.D.   On: 09/01/2019 21:43   DG Abd Portable 1V  Result Date: 09/02/2019 CLINICAL DATA:  OG tube placement EXAM: PORTABLE ABDOMEN - 1 VIEW COMPARISON:  None. FINDINGS: The OG tube projects over the gastric body. There is a large amount of stool at the level of the rectum and cecum. The bowel gas  pattern appears to be nonobstructive. There is no pneumatosis or free air. IMPRESSION: 1. OG tube projects over the gastric body. 2. Large amount of stool at the level of the rectum and cecum. Electronically Signed   By: Constance Holster M.D.   On: 09/02/2019 00:57   CT Angio Abd/Pel w/ and/or w/o  Result Date: 09/01/2019 CLINICAL DATA:  GI bleed. Coffee ground emesis. EXAM: CTA ABDOMEN AND PELVIS WITHOUT AND WITH CONTRAST TECHNIQUE: Multidetector CT imaging of the abdomen and pelvis was performed using the standard protocol during bolus administration of intravenous contrast. Multiplanar reconstructed images and MIPs were obtained and reviewed to evaluate the vascular anatomy. CONTRAST:  10mL OMNIPAQUE IOHEXOL 350 MG/ML SOLN COMPARISON:  None. FINDINGS: VASCULAR Aorta: There are atherosclerotic changes throughout the visualized abdominal aorta without evidence for an aneurysm or dissection. Celiac: There is moderate narrowing at the origin of the celiac axis. SMA: There is moderate narrowing at the origin of the SMA. Renals: Both renal arteries appears to be significantly attenuated. IMA: Patent without evidence of aneurysm, dissection, vasculitis or significant stenosis. Inflow: Atherosclerotic changes are noted of the bilateral common iliac arteries resulting in mild-to-moderate narrowing bilaterally. Proximal Outflow: There are  atherosclerotic changes throughout the right CFA and SFA resulting in mild-to-moderate narrowing. There is a patent SFA stent on the left. Veins: No obvious venous abnormality within the limitations of this arterial phase study. Portal vein is patent. The splenic vein is patent. Review of the MIP images confirms the above findings. NON-VASCULAR Lower chest: There are small bilateral pleural effusions. There is evidence for vascular congestion.The heart size is significantly enlarged. There is reflux of contrast into the IVC. Hepatobiliary: There are coarse calcifications within the  right hepatic lobe. There is periportal edema. There is diffuse gallbladder wall thickening.There is no biliary ductal dilation. Pancreas: Peripancreatic fat stranding and free fluid is noted. The pancreas appears somewhat edematous. Spleen: Unremarkable. Adrenals/Urinary Tract: --Adrenal glands: Unremarkable. --Right kidney/ureter: No hydronephrosis or radiopaque kidney stones. --Left kidney/ureter: No hydronephrosis or radiopaque kidney stones. --Urinary bladder: The bladder is decompressed with a Foley catheter and therefore is poorly evaluated. Stomach/Bowel: --Stomach/Duodenum: In NG tube courses into the stomach with the tip terminating near the gastric fundus. There is no evidence for active extravasation into the gastric lumen. --Small bowel: Unremarkable. --Colon: There is a large amount of stool at the level of the patient's rectum. There is a moderate amount of stool throughout the remaining portions of the colon. There is a moderate amount of stool at the level of the cecum. --Appendix: Normal. Lymphatic: --No retroperitoneal lymphadenopathy. --No mesenteric lymphadenopathy. --No pelvic or inguinal lymphadenopathy. Reproductive: Status post hysterectomy. No adnexal mass. Other: There is a small volume of ascites in the abdomen and pelvis. Mild diffuse body wall edema is noted. Musculoskeletal. No acute displaced fractures. IMPRESSION: 1. No evidence for active GI bleeding on this study. 2. Significantly attenuated mesenteric vasculature which is felt to be secondary to hypotension and vasopressor usage. 3. Peripancreatic fat stranding and free fluid raises concern for underlying interstitial pancreatitis. Correlation with lipase is recommended. There is no evidence for pancreatic necrosis. 4. Periportal edema which is nonspecific and can be seen in patients with hepatitis or may be secondary to aggressive volume resuscitation. 5. Diffuse gallbladder wall thickening which may be secondary to the presence  of ascites, heart failure, or acute hepatitis. If there is clinical concern for acute cholecystitis, follow-up with ultrasound is recommended. 6. Probable developing anasarca and small bilateral pleural effusions. 7. Cardiomegaly with reflux of contrast into the IVC consistent with underlying cardiac dysfunction. 8. Large amount of stool at the level of the patient's rectum. There is a moderate amount of stool throughout the remaining portions of the colon. Aortic Atherosclerosis (ICD10-I70.0). Electronically Signed   By: Constance Holster M.D.   On: 09/01/2019 23:49       Assessment / Plan:    86 y/o female admitted with massive UGI bleed, hemorrhagic shock in the setting of Xarelto - received K-centra overnight, cryo, intubated, pressors, massive transfusion protocol, she is critically ill. Massive troponin elevation with nonspecific ST / T wave changes. As above, in speaking with nurse she has not had much output from the NG and per rectum overnight, Hgb now at 15 post transfusion, lactic acidosis improving. CT angio overnight showed no extravasation.   I spoke with her grandson Blayklee Dambrosia [phone], he is health care proxy. He wants full care for this however if her heart stops he does not want resuscitation / CPR. We discussed role of endoscopy and how aggressive he wanted to be with that. She is rather agitated on the vent right now and hypotensive on pressors, further sedation for endoscopy  could make this worse and precipitate cardiovascular collapse, and she remains coagulopathic which would make therapeutic endoscopy quite challenging. Recommend echo this AM to assess cardiac function and see how she does on IV PPI and monitor Hgb and for recurrent severe bleeding, continue supportive care and reverse coagulopathy if possible. [name] was in agreement, wants to hold off on endoscopy right now and will await her course this AM and reassess her later. If she has significant bleeding in the  interim please contact me.   D'Lo Cellar, MD [facility]

## 2019-09-02 NOTE — Progress Notes (Signed)
eLink Physician-Brief Progress Note Patient Name: Tiffany Charles DOB: 04-Oct-1929 MRN: [id number]   Date of Service  09/02/2019  HPI/Events of Note  Pt on the ventilator and  More awake with increased risk of self-extubation.  eICU Interventions  Bilateral soft wrist  Restraints ordered for safety.        Kerry Kass Lelia Jons 09/02/2019, 3:42 AM

## 2019-09-02 NOTE — Progress Notes (Signed)
Initial Nutrition Assessment  DOCUMENTATION CODES:   Not applicable  INTERVENTION:   As clinical status allows, recommend start enteral nutrition via OG tube:   Vital AF 1.2 at 45 ml/h (1080 ml per day)   Provides 1296 kcal, 81 gm protein, 876 ml free water daily  NUTRITION DIAGNOSIS:   Inadequate oral intake related to inability to eat as evidenced by NPO status.  GOAL:   Patient will meet greater than or equal to 90% of their needs  MONITOR:   Vent status, I & O's, Labs  REASON FOR ASSESSMENT:   Ventilator    ASSESSMENT:   86 yo female admitted with hemorrhagic shock due to GI hemorrhage. Required intubation on admission. PMH includes A fib, COPD, CAD, HTN.   Only a small amount of coffee ground output from OG this morning. Plans to hold off on endoscopy for now due to cardiac issues. Elevated troponin with nonspecific ST / T wave changes.  Patient is currently intubated on ventilator support MV: 10.4 L/min Temp (24hrs), Avg:93.9 F (34.4 C), Min:89.6 F (32 C), Max:98.2 F (36.8 C)   Labs reviewed. BUN 61 (H), Creat 1.63 (H), phos 8.6 (H) CBG's: [id number]  Medications reviewed and include levophed.  Recent weight encounters reviewed. Weight has ranged from 52.6 to 54.4 kg over the past month. Currently 62 kg.  NUTRITION - FOCUSED PHYSICAL EXAM:  unable to complete  Diet Order:   Diet Order            Diet NPO time specified  Diet effective now              EDUCATION NEEDS:   Not appropriate for education at this time  Skin:  Skin Assessment: Reviewed RN Assessment(L foot red & swollen)  Last BM:  4/9 type 4 small, dark  Height:   Ht Readings from Last 1 Encounters:  09/01/19 5\' 6"  (1.676 m)    Weight:   Wt Readings from Last 1 Encounters:  09/02/19 62 kg    Ideal Body Weight:  59.1 kg  BMI:  Body mass index is 22.06 kg/m.  Estimated Nutritional Needs:   Kcal:  1325  Protein:  80-100 gm  Fluid:  >/= 1.5  L    Molli Barrows, RD, LDN, CNSC Please refer to Amion for contact information.

## 2019-09-02 NOTE — Progress Notes (Signed)
  Echocardiogram 2D Echocardiogram has been performed.  Tiffany Charles 09/02/2019, 9:31 AM

## 2019-09-02 NOTE — Consult Note (Signed)
[facility] Nurse Consult Note: Patient receiving care in [facility] 2M10.  Assisted with turning by primary RN. Reason for Consult: vagina MASD and left foot wound Wound type: MASD, fungal to vagina.  Wound of unknown origin to dorsum of left foot Pressure Injury POA: Yes/No/NA Measurement: left foot wound measures 2 cm x 4 cm  Wound bed: pink and yellow, superficial Drainage (amount, consistency, odor) none Periwound: cold, purple feet and toes bilaterally.  Patient on IV vasopressor Dressing procedure/placement/frequency: Daily application of small foam dressing for foot wound. Antifungal powder q 4 hours to vaginal folds. Monitor the wound area(s) for worsening of condition such as: Signs/symptoms of infection,  Increase in size,  Development of or worsening of odor, Development of pain, or increased pain at the affected locations.  Notify the medical team if any of these develop.  Thank you for the consult.  Discussed plan of care with the bedside nurse.  [place] nurse will not follow at this time.  Please re-consult the [place] team if needed.  Val Riles, RN, MSN, CWOCN, CNS-BC, pager [phone]

## 2019-09-02 NOTE — Progress Notes (Signed)
CRITICAL VALUE ALERT  Critical Value:  Troponin   Date & Time Notied: 4/9 05:50  Provider Notified: Warren Lacy RN   Orders Received/Actions taken: RN will notify MD

## 2019-09-02 NOTE — Progress Notes (Signed)
CRITICAL VALUE ALERT  Critical Value: INR 4.4  Date & Time Notied: 4/9 01:08   Provider Notified: E-link RN   Orders Received/Actions taken: RN will make MD aware

## 2019-09-02 NOTE — Progress Notes (Signed)
[place] Progress Note Patient Name: Tiffany Charles DOB: Dec 28, 1929 MRN: [id number]   Date of Service  09/02/2019  HPI/Events of Note  Pt with hypotension following Metoprolol 2.5 mg iv x 2   eICU Interventions  Albumin 5 % 250 ml iv bolus over 1 hour        Frederik Pear 09/02/2019, 6:37 AM

## 2019-09-02 NOTE — Progress Notes (Addendum)
NAME:  Tiffany Charles, MRN:  [id number], DOB:  05-14-1930, LOS: 1 ADMISSION DATE:  09/01/2019, CONSULTATION DATE:  4/8 REFERRING MD:  Dr. Darl Householder, CHIEF COMPLAINT:  Hemorrhagic shock   Brief History   86 year old female on Xarelto for Atrial fibrillation admitted with hemorrhagic shock secondary to gastrointestinal hemorrhage.   History of present illness   Patient is encephalopathic and/or intubated. Therefore history has been obtained from chart review.  86 year old female with PMH as below, which is significant for Atrial fibrillation on Xarelto, COPD, CAD, and HTN. She was recently admitted to [facility] and treated for cellulitis with keflex and doxycycline. She presented to [facility] ED 4/8 after being found altered at SNF with coffee-ground emesis beside her bed. Upon arrival to the ED she was hypoxemic and hypotensive. Her mental status was poor and she ultimately required intubation. Laboratory evaluation significant for undetectable blood count and INR > 10. She was given [facility] in ED and blood transfusion was initiated. Gastroenterology has seen her in the ED and has deemed her to not presently be a candidate for endoscopy due to acuity of her condition. PCCM asked to admit.   Past Medical History   has a past medical history of Angina pectoris, Arthritis, Chronic anticoagulation, Chronic diastolic (congestive) heart failure ([place]), COPD (chronic obstructive pulmonary disease) ([place]), Coronary artery disease, Depression, Essential hypertension, Hyperlipidemia, Mild pulmonary hypertension ([place]) (09/17/2016), Permanent atrial fibrillation [facility]), Skin cancer (Jan 2013), and Tricuspid regurgitation (09/17/2016).  Significant Hospital Events   4/8 admit  Consults:  GI  Procedures:  ETT 4/8 >  Significant Diagnostic Tests:  CTA abdomen/pelvis 4/8 > no evidence of acute GI bleed.  Large amount of stool at the level of the patient's rectum. 09/02/2018 one 2D echo>> Micro Data:  Blood Cx 4/8 > Urine  4/8 >  Antimicrobials:  Flagyl 4/8  Cefepime 4/8 > Vancomycin 4/8>  Interim history/subjective:   86 year old female with acute GI bleed multiple transfusions hypertension hemorrhagic shock.  Now improved but noted to have elevated troponins 2D echo to be performed Objective   Blood pressure 95/75, pulse (!) 103, temperature (!) 97.5 F (36.4 C), resp. rate (!) 30, height 5\' 6"  (1.676 m), weight 62 kg, SpO2 (!) 69 %.    Vent Mode: PRVC FiO2 (%):  [50 %-100 %] 50 % Set Rate:  [22 bmp-30 bmp] 30 bmp Vt Set:  [350 mL-470 mL] 350 mL PEEP:  [5 cmH20-8 cmH20] 5 cmH20 Plateau Pressure:  [16 cmH20-20 cmH20] 20 cmH20   Intake/Output Summary (Last 24 hours) at 09/02/2019 [id number] Last data filed at 09/02/2019 0800 Gross per 24 hour  Intake 4684.01 ml  Output 270 ml  Net 4414.01 ml   Filed Weights   09/01/19 2000 09/01/19 2111 09/02/19 0431  Weight: 54 kg 54 kg 62 kg    Examination: General: Frail elderly female who is currently sedated on full mechanical ventilatory support HEENT: Moderate JVD is noted.  Endotracheal tube is in place.  Pupils are pinpoint reactive to light Neuro: Currently sedated on the vent CV: Heart sounds are regular PULM: Decreased breath sounds in the bases Vent pressure regulated volume control FIO2 50 PEEP 5 RATE 30 VT 350  GI: Abdomen is protuberant but not particularly tender.  No bowel sounds.  Gastric tube is in place with coffee-ground emesis 1 small dark stools been noted.  No active lower GI bleed GU: Amber urine Extremities: Lower extremities are with dressing on left anterior foot both feet are  cold to touch with pulses Skin: Multiple areas of ecchymosis   Resolved Hospital Problem list    Assessment & Plan:   Hemorrhagic shock: secondary to GIB. Was briefly on levophed, but with blood transfusion is now off. KCentra given in ED.   ICU monitoring Reversal of anticoagulants Observation for further bleeding GI consult Vasopressor support  Decrease sedation Currently on empirical antimicrobial therapy with low threshold to stop  Gastrointestinal hemorrhage: suspect upper GI source considering coffee-ground emesis and black stools.  Recent Labs    09/02/19 0013 09/02/19 0425  HGB 10.9* 15.2*     GI is consulted questionable if invasive procedure is planned.  Her hemoglobin has stabilized.  No overt bleeding at this time.  Her INR does remain elevated at 3.3 therefore significant risk with intervention. Anticoagulants reversed with Kcentra along with platelets Hemoglobin improved  Acute hypoxemic respiratory failure: Questionable COPD history Full ventilatory support monitor and assess for spontaneous breathing trial Serial chest x-rays Decrease sedation Bronchodilators as needed Vent bundle in place   Elevated cardiac enzymes last noted be 9549 on 09/02/2019 at 0 424 Repeat troponins Anticoagulation is not an option She is too ill for cardiac interventions at this time Questionable cardiology consult in future 09/02/2018 one 2D echo  AKI Lab Results  Component Value Date   CREATININE 1.63 (H) 09/02/2019   CREATININE 1.79 (H) 09/01/2019   CREATININE 0.60 08/13/2019   CREATININE 0.87 07/11/2013    Monitor creatinine, note she had contrast dye for CT abdomen Avoid further nephrotoxins   Atrial fib, RVR Cardiac monitoring No anticoagulation  Lactic acidosis:  Treat underlying shock Evaluate lactic acid   Synopsis of care for 09/02/2019 Weaning vasopressors0 Weaning sedation GI is evaluating for possible intervention Continue to monitor INR If an invasive procedure planned would give 2 units FFP prior to procedure No further anticoagulation She is listed as a DNR but per discussions with her son he wants everything done up to and including invasive procedures i.e. endoscopy but no CPR or shock.  Best practice:  Diet: NPO Pain/Anxiety/Delirium protocol (if indicated): PAD VAP protocol (if  indicated): per protocol DVT prophylaxis: none GI prophylaxis: PPI drip Glucose control: SSI Mobility: BR Code Status: DNR Family Communication: GI spoke to son he wants everything done up to including endoscopy but no shock no CPR should she code. Disposition: ICU  Labs   CBC: Recent Labs  Lab 09/01/19 1925 09/01/19 2143 09/01/19 2356 09/02/19 0013 09/02/19 0030 09/02/19 0425  WBC Not Measured 10.7* 15.0*  --   --  17.1*  NEUTROABS Not Measured 8.7*  --   --   --   --   HGB Not Measured 5.5* 11.0* 10.9*  --  15.2*  HCT Not Measured 20.2* 34.5* 32.0*  --  46.3*  MCV Not Measured 105.8* 90.8  --   --  88.7  PLT Not Measured 295 235  --  [id number]    Basic Metabolic Panel: Recent Labs  Lab 09/01/19 1925 09/02/19 0013 09/02/19 0424  NA 141 139 140  K 4.5 4.3 4.8  CL 108  --  108  CO2 <7*  --  17*  GLUCOSE 140*  --  110*  BUN 66*  --  61*  CREATININE 1.79*  --  1.63*  CALCIUM 8.4*  --  7.8*  MG  --   --  2.0  PHOS  --   --  8.6*   GFR: Estimated Creatinine Clearance: 22.8 mL/min (A) (by C-G formula based  on SCr of 1.63 mg/dL (H)). Recent Labs  Lab 09/01/19 1925 09/01/19 1928 09/01/19 2143 09/01/19 2356 09/01/19 2357 09/02/19 0425 09/02/19 0545  WBC Not Measured  --  10.7* 15.0*  --  17.1*  --   LATICACIDVEN  --  >11.0*  --   --  7.4* 3.6* 2.6*    Liver Function Tests: Recent Labs  Lab 09/01/19 1925 09/02/19 0030  AST 68* 174*  ALT 43 169*  ALKPHOS 48 41  BILITOT 0.7 1.8*  PROT 5.1* 4.2*  ALBUMIN 3.0* 2.5*   Recent Labs  Lab 09/02/19 0030  LIPASE 33   No results for input(s): AMMONIA in the last 168 hours.  ABG    Component Value Date/Time   PHART 7.186 (LL) 09/02/2019 0013   PCO2ART 27.8 (L) 09/02/2019 0013   PO2ART 152.0 (H) 09/02/2019 0013   HCO3 10.5 (L) 09/02/2019 0013   TCO2 11 (L) 09/02/2019 0013   ACIDBASEDEF 16.0 (H) 09/02/2019 0013   O2SAT 99.0 09/02/2019 0013     Coagulation Profile: Recent Labs  Lab 09/01/19 1925  09/01/19 2356 09/02/19 0030 09/02/19 0424  INR >10.0* 4.4* 4.1* 3.3*    Cardiac Enzymes: No results for input(s): CKTOTAL, CKMB, CKMBINDEX, TROPONINI in the last 168 hours.  HbA1C: Hgb A1c MFr Bld  Date/Time Value Ref Range Status  06/08/2019 09:08 AM 5.0 4.8 - 5.6 % Final    Comment:    (NOTE) Pre diabetes:          5.7%-6.4% Diabetes:              >6.4% Glycemic control for   <7.0% adults with diabetes     CBG: Recent Labs  Lab 09/01/19 1919 09/01/19 2011 09/01/19 2328 09/02/19 0319 09/02/19 0738  GLUCAP 71 238* 132* 136* 78      App Critical care time: 63 mins     Steve Minor ACNP Acute Care Nurse Practitioner [place] Please consult Amion 09/02/2019, 8:33 AM   Pulmonary critical care attending attending:  This is an 86 year old female on Xarelto for atrial fibrillation admitted for hemorrhagic shock secondary to gastrointestinal hemorrhage presumed upper GI.  Status post fluid and blood product resuscitation.  Blood pressure 110/87, pulse 90, temperature 99 F (37.2 C), resp. rate (!) 30, height 5\' 6"  (1.676 m), weight 62 kg, SpO2 100 %. General: Elderly female intubated on mechanical life support Heart: Regular rate and rhythm S1-S2 Lungs: Bilateral mechanically ventilated breath sounds Abdomen: Slightly distended  Labs: Reviewed, hemoglobin now stable we will continue to follow Chest x-ray: Reviewed no pneumothorax post central line placement.  CT head with no evidence of stroke, does have cerebral atrophy  CT abdomen pelvis: Possible pancreatitis, diffuse gallbladder thickening  Assessment: Hemorrhagic shock secondary to presumed GI bleed, on Xarelto, given Kcentra on admission Had coffee-ground emesis and black stools now have which stopped Acute hypoxemic respiratory failure requiring intubation mechanical ventilation secondary to above Elevated troponin, likely hypoxic in the demand ischemia, also related to low  hemoglobin Atrial fibrillation Patient is a DNR Gallbladder thickening, possible cholecystitis  Plan: Titrate patient off of Levophed as tolerated Continue to follow H&H Transfuse for hemoglobin less than 7 We appreciate GI consultation At this time holding off on endoscopy If significant bleeding returns we will inform GI.  This patient is critically ill with multiple organ system failure; which, requires frequent high complexity decision making, assessment, support, evaluation, and titration of therapies. This was completed through the application of advanced monitoring technologies and  extensive interpretation of multiple databases. During this encounter critical care time was devoted to patient care services described in this note for 32 minutes.  [place] 09/02/2019 6:00 PM

## 2019-09-02 NOTE — Progress Notes (Signed)
eLink Physician-Brief Progress Note Patient Name: ELEESA VANAKEN DOB: 1930-04-11 MRN: [id number]   Date of Service  09/02/2019  HPI/Events of Note  Heart rate 140-160, ? SVT  eICU Interventions  Lopressor 2.5 mg iv Q 5 minutes x 2 doses        Deontaye Civello U Violet Seabury 09/02/2019, 6:07 AM

## 2019-09-02 NOTE — Progress Notes (Signed)
CRITICAL VALUE ALERT  Critical Value: Troponin   Date & Time Notied:  4/9  01:47  Provider Notified: E-link RN  Orders Received/Actions taken: RN will notify MD

## 2019-09-02 NOTE — Progress Notes (Signed)
eLink Physician-Brief Progress Note Patient Name: Tiffany Charles DOB: 1930-02-12 MRN: [id number]   Date of Service  09/02/2019  HPI/Events of Note  Pt on Xarelto for atrial fibrillation, she presents with massive GI bleeding with hemorrhagic shock and multiple organ systems failure, her son has made her a DNR although he wants all aggressive measures up  to the point of death.  eICU Interventions  Transfuse via massive blood loss transfusion protocol to hemoglobin of 8 gm,  Monitor coagulopathy, mechanical ventilation for acute hypoxemic respiratory failure. Overall prognosis is guarded. New Patient Evaluation completed.        Kerry Kass Oluwaferanmi Wain 09/02/2019, 12:10 AM

## 2019-09-03 ENCOUNTER — Inpatient Hospital Stay (HOSPITAL_COMMUNITY): Payer: Medicare HMO

## 2019-09-03 DIAGNOSIS — I214 Non-ST elevation (NSTEMI) myocardial infarction: Secondary | ICD-10-CM

## 2019-09-03 DIAGNOSIS — J9690 Respiratory failure, unspecified, unspecified whether with hypoxia or hypercapnia: Secondary | ICD-10-CM | POA: Diagnosis not present

## 2019-09-03 DIAGNOSIS — R578 Other shock: Secondary | ICD-10-CM | POA: Diagnosis not present

## 2019-09-03 DIAGNOSIS — J9601 Acute respiratory failure with hypoxia: Secondary | ICD-10-CM | POA: Diagnosis not present

## 2019-09-03 DIAGNOSIS — Z7901 Long term (current) use of anticoagulants: Secondary | ICD-10-CM | POA: Diagnosis not present

## 2019-09-03 DIAGNOSIS — K922 Gastrointestinal hemorrhage, unspecified: Secondary | ICD-10-CM | POA: Diagnosis not present

## 2019-09-03 DIAGNOSIS — I2510 Atherosclerotic heart disease of native coronary artery without angina pectoris: Secondary | ICD-10-CM | POA: Diagnosis not present

## 2019-09-03 DIAGNOSIS — I5041 Acute combined systolic (congestive) and diastolic (congestive) heart failure: Secondary | ICD-10-CM

## 2019-09-03 LAB — BPAM CRYOPRECIPITATE
Blood Product Expiration Date: 202104090947
Blood Product Expiration Date: 202104090953
Blood Product Expiration Date: 202104091205
ISSUE DATE / TIME: 202104090420
ISSUE DATE / TIME: 202104090420
ISSUE DATE / TIME: 202104090630
Unit Type and Rh: 6200
Unit Type and Rh: 6200
Unit Type and Rh: 6200

## 2019-09-03 LAB — CBC WITH DIFFERENTIAL/PLATELET
Abs Immature Granulocytes: 0.05 10*3/uL (ref 0.00–0.07)
Basophils Absolute: 0 10*3/uL (ref 0.0–0.1)
Basophils Relative: 0 %
Eosinophils Absolute: 0.1 10*3/uL (ref 0.0–0.5)
Eosinophils Relative: 1 %
HCT: 39.2 % (ref 36.0–46.0)
Hemoglobin: 13.5 g/dL (ref 12.0–15.0)
Immature Granulocytes: 0 %
Lymphocytes Relative: 16 %
Lymphs Abs: 1.8 10*3/uL (ref 0.7–4.0)
MCH: 29 pg (ref 26.0–34.0)
MCHC: 34.4 g/dL (ref 30.0–36.0)
MCV: 84.3 fL (ref 80.0–100.0)
Monocytes Absolute: 0.8 10*3/uL (ref 0.1–1.0)
Monocytes Relative: 7 %
Neutro Abs: 9 10*3/uL — ABNORMAL HIGH (ref 1.7–7.7)
Neutrophils Relative %: 76 %
Platelets: 217 10*3/uL (ref 150–400)
RBC: 4.65 MIL/uL (ref 3.87–5.11)
RDW: 16.9 % — ABNORMAL HIGH (ref 11.5–15.5)
WBC: 11.7 10*3/uL — ABNORMAL HIGH (ref 4.0–10.5)
nRBC: 4.2 % — ABNORMAL HIGH (ref 0.0–0.2)

## 2019-09-03 LAB — BPAM PLATELET PHERESIS
Blood Product Expiration Date: 202104102359
Blood Product Expiration Date: 202104102359
ISSUE DATE / TIME: 202104090127
ISSUE DATE / TIME: 202104090127
Unit Type and Rh: 5100
Unit Type and Rh: 6200

## 2019-09-03 LAB — PREPARE CRYOPRECIPITATE
Unit division: 0
Unit division: 0
Unit division: 0

## 2019-09-03 LAB — BASIC METABOLIC PANEL
Anion gap: 12 (ref 5–15)
BUN: 69 mg/dL — ABNORMAL HIGH (ref 8–23)
CO2: 18 mmol/L — ABNORMAL LOW (ref 22–32)
Calcium: 7.6 mg/dL — ABNORMAL LOW (ref 8.9–10.3)
Chloride: 111 mmol/L (ref 98–111)
Creatinine, Ser: 2.1 mg/dL — ABNORMAL HIGH (ref 0.44–1.00)
GFR calc Af Amer: 24 mL/min — ABNORMAL LOW (ref >60)
GFR calc non Af Amer: 21 mL/min — ABNORMAL LOW (ref >60)
Glucose, Bld: 90 mg/dL (ref 70–99)
Potassium: 3.4 mmol/L — ABNORMAL LOW (ref 3.5–5.1)
Sodium: 141 mmol/L (ref 135–145)

## 2019-09-03 LAB — GLUCOSE, CAPILLARY
Glucose-Capillary: 375 mg/dL — ABNORMAL HIGH (ref 70–99)
Glucose-Capillary: 50 mg/dL — ABNORMAL LOW (ref 70–99)
Glucose-Capillary: 64 mg/dL — ABNORMAL LOW (ref 70–99)
Glucose-Capillary: 163 mg/dL — ABNORMAL HIGH (ref 70–99)
Glucose-Capillary: 69 mg/dL — ABNORMAL LOW (ref 70–99)
Glucose-Capillary: 77 mg/dL (ref 70–99)
Glucose-Capillary: 68 mg/dL — ABNORMAL LOW (ref 70–99)
Glucose-Capillary: 120 mg/dL — ABNORMAL HIGH (ref 70–99)

## 2019-09-03 LAB — PREPARE PLATELET PHERESIS
Unit division: 0
Unit division: 0

## 2019-09-03 LAB — PROTIME-INR
INR: 2.9 — ABNORMAL HIGH (ref 0.8–1.2)
Prothrombin Time: 29.9 seconds — ABNORMAL HIGH (ref 11.4–15.2)

## 2019-09-03 LAB — VANCOMYCIN, RANDOM: Vancomycin Rm: 9

## 2019-09-03 LAB — MAGNESIUM: Magnesium: 1.8 mg/dL (ref 1.7–2.4)

## 2019-09-03 LAB — PHOSPHORUS: Phosphorus: 5.3 mg/dL — ABNORMAL HIGH (ref 2.5–4.6)

## 2019-09-03 MED ORDER — PRO-STAT SUGAR FREE PO LIQD: 30.0000 mL | Freq: Two times a day (BID) | ORAL | Status: DC

## 2019-09-03 MED ORDER — PHYTONADIONE 1 MG/0.5 ML ORAL SOLUTION
10.0000 mg | Freq: Every day | ORAL | Status: DC
  Filled 2019-09-03: qty 5

## 2019-09-03 MED ORDER — DEXMEDETOMIDINE HCL IN NACL 400 MCG/100ML IV SOLN
0.4000 ug/kg/h | INTRAVENOUS | Status: DC
  Administered 2019-09-03: 21:00:00 0.6 ug/kg/h via INTRAVENOUS
  Administered 2019-09-03: 10:00:00 0.4 ug/kg/h via INTRAVENOUS
  Administered 2019-09-04: 07:00:00 0.6 ug/kg/h via INTRAVENOUS
  Filled 2019-09-03 (×3): qty 100

## 2019-09-03 MED ORDER — FENTANYL CITRATE (PF) 100 MCG/2ML IJ SOLN
25.0000 ug | INTRAMUSCULAR | Status: DC | PRN
  Administered 2019-09-03: 100 ug via INTRAVENOUS

## 2019-09-03 MED ORDER — PANTOPRAZOLE SODIUM 40 MG IV SOLR
40.0000 mg | Freq: Two times a day (BID) | INTRAVENOUS | Status: DC
  Administered 2019-09-03 – 2019-09-12 (×19): 40 mg via INTRAVENOUS
  Filled 2019-09-03 (×19): qty 40

## 2019-09-03 MED ORDER — CHLORHEXIDINE GLUCONATE 0.12 % MT SOLN
OROMUCOSAL | Status: AC
  Filled 2019-09-03: qty 15

## 2019-09-03 MED ORDER — DEXTROSE 10 % IV SOLN: INTRAVENOUS | Status: DC

## 2019-09-03 MED ORDER — VITAL AF 1.2 CAL PO LIQD
1000.0000 mL | ORAL | Status: DC
  Administered 2019-09-03: 12:00:00 1000 mL

## 2019-09-03 MED ORDER — THIAMINE HCL 100 MG PO TABS
500.0000 mg | ORAL_TABLET | Freq: Every day | ORAL | Status: DC
  Administered 2019-09-03: 10:00:00 500 mg
  Filled 2019-09-03: qty 5

## 2019-09-03 MED ORDER — DEXTROSE 50 % IV SOLN
INTRAVENOUS | Status: AC
  Administered 2019-09-03: 05:00:00 50 mL
  Filled 2019-09-03: qty 50

## 2019-09-03 MED ORDER — PHYTONADIONE 5 MG PO TABS
10.0000 mg | ORAL_TABLET | Freq: Every day | ORAL | Status: DC
  Administered 2019-09-03 – 2019-09-04 (×2): 10 mg
  Filled 2019-09-03 (×3): qty 2

## 2019-09-03 MED ORDER — MIDAZOLAM HCL 2 MG/2ML IJ SOLN
2.0000 mg | INTRAMUSCULAR | Status: DC | PRN
  Administered 2019-09-03 (×2): 2 mg via INTRAVENOUS
  Filled 2019-09-03 (×2): qty 2

## 2019-09-03 MED ORDER — FOLIC ACID 1 MG PO TABS
1.0000 mg | ORAL_TABLET | Freq: Every day | ORAL | Status: DC
  Administered 2019-09-03 – 2019-09-06 (×2): 1 mg
  Filled 2019-09-03 (×3): qty 1

## 2019-09-03 MED ORDER — ADULT MULTIVITAMIN LIQUID CH
15.0000 mL | Freq: Every day | ORAL | Status: DC
  Administered 2019-09-03 – 2019-09-07 (×3): 15 mL
  Filled 2019-09-03 (×3): qty 15

## 2019-09-03 MED ORDER — ATORVASTATIN CALCIUM 10 MG PO TABS
20.0000 mg | ORAL_TABLET | Freq: Every day | ORAL | Status: DC
  Administered 2019-09-03 – 2019-09-26 (×21): 20 mg via ORAL
  Filled 2019-09-03 (×23): qty 2

## 2019-09-03 NOTE — Progress Notes (Signed)
BG 375, D10 stopped. E-link notified. Waiting for orders.

## 2019-09-03 NOTE — Progress Notes (Addendum)
NAME:  Tiffany Charles, MRN:  [id number], DOB:  1930/01/19, LOS: 2 ADMISSION DATE:  09/01/2019, CONSULTATION DATE:  4/8 REFERRING MD:  Dr. Darl Householder, CHIEF COMPLAINT:  Hemorrhagic shock   Brief History   86 year old female on Xarelto for Atrial fibrillation admitted with hemorrhagic shock secondary to gastrointestinal hemorrhage.   History of present illness   Patient is encephalopathic and/or intubated. Therefore history has been obtained from chart review.  86 year old female with PMH as below, which is significant for Atrial fibrillation on Xarelto, COPD, CAD, and HTN. She was recently admitted to [facility] and treated for cellulitis with keflex and doxycycline. She presented to [facility] ED 4/8 after being found altered at SNF with coffee-ground emesis beside her bed. Upon arrival to the ED she was hypoxemic and hypotensive. Her mental status was poor and she ultimately required intubation. Laboratory evaluation significant for undetectable blood count and INR > 10. She was given [facility] in ED and blood transfusion was initiated. Gastroenterology has seen her in the ED and has deemed her to not presently be a candidate for endoscopy due to acuity of her condition. PCCM asked to admit.   Past Medical History   has a past medical history of Angina pectoris, Arthritis, Chronic anticoagulation, Chronic diastolic (congestive) heart failure ([place]), COPD (chronic obstructive pulmonary disease) ([place]), Coronary artery disease, Depression, Essential hypertension, Hyperlipidemia, Mild pulmonary hypertension ([place]) (09/17/2016), Permanent atrial fibrillation [facility]), Skin cancer (Jan 2013), and Tricuspid regurgitation (09/17/2016).  Significant Hospital Events   4/8 admit  Consults:  GI  Procedures:  ETT 4/8 >  Significant Diagnostic Tests:  CTA abdomen/pelvis 4/8 > no evidence of acute GI bleed.  Large amount of stool at the level of the patient's rectum.  09/02/2018 one 2D echo>>1. Left ventricular ejection  fraction, by estimation, is 35 to 40%. The left ventricle has moderately decreased function. The left ventricle  demonstrates regional wall motion abnormalities (see scoring  diagram/findings for description). There is mild  concentric left ventricular hypertrophy. Left ventricular diastolic  function could not be evaluated.  2. Right ventricular systolic function is severely reduced. The right  ventricular size is mildly enlarged. There is moderately elevated  pulmonary artery systolic pressure. The estimated right ventricular  systolic pressure is 123XX123 mmHg.  3. Left atrial size was severely dilated.  4. Right atrial size was severely dilated.  5. The mitral valve is normal in structure. Moderate mitral valve  regurgitation.  6. Tricuspid valve regurgitation is mild to moderate.  7. The aortic valve is normal in structure. Aortic valve regurgitation is  not visualized. Mild aortic valve sclerosis is present, with no evidence  of aortic valve stenosis.  8. The inferior vena cava is dilated in size with <50% respiratory  variability, suggesting right atrial pressure of 15 mmHg.   Micro Data:  Blood Cx 4/8 > Urine 4/8 >  Antimicrobials:  Flagyl 4/8  Cefepime 4/8 > 4/9 Vancomycin 4/8  Interim history/subjective:  Hypoglycemic overnight and started on D10 drip. Sedation weaned this AM and patient appears agitated and comfortable.   Objective   Blood pressure 99/69, pulse (!) 125, temperature 99 F (37.2 C), resp. rate (!) 25, height 5\' 6"  (1.676 m), weight 64.8 kg, SpO2 96 %.    Vent Mode: PRVC FiO2 (%):  [40 %] 40 % Set Rate:  [30 bmp] 30 bmp Vt Set:  [350 mL] 350 mL PEEP:  [5 cmH20] 5 cmH20 Plateau Pressure:  [17 cmH20-20 cmH20] 19 cmH20  Intake/Output Summary (Last 24 hours) at 09/03/2019 0953 Last data filed at 09/03/2019 0800 Gross per 24 hour  Intake 2478.59 ml  Output 616 ml  Net 1862.59 ml   Filed Weights   09/01/19 2111 09/02/19 0431 09/03/19 0500   Weight: 54 kg 62 kg 64.8 kg    Examination: General: chronically ill appearing female, moving all 4 extremities in bed but not following commands, appears uncomfortable  HEENT: ET and O tube in place  Neuro: not following commands  CV: tachycardic, irregularly irregular  PULM: clear anteriorly, decreased breath sounds at the bases GI: soft, NTND, no blood noted from OG tube  GU: foley in place with amber urine  Extremities: bilateral pedal edema  Skin: diffuse bruising over bilateral upper and lower extremities    Resolved Hospital Problem list     Assessment & Plan:   # Hemorrhagic shock secondary to GIB from [place] with  - S/p 4 units of pRBCs, 1 of cryo, platelets, and Kcentra - Hgb trending down 14.4 --> 13.5 this AM - No blood from OG tube, does continues to have some melena but minimal   - Continues to require minimal pressor support, on levophed at 6 mcg  - Switch Protonix gtt to IV BID  - Start vitamin K 10 mg daily - Start MVI, thiamine, and folate  - Stop cefepime given low suspicion for active infection at this time, cx negative to date  - Follow up INR today, 3.3 yesterday  - CBC daily  - GI consulted and following, they have recommended against invasive intervention at this time as she is critically ill and DNR  - Will continue pressor support, though overall poor prognosis given multiorgan failure  # Acute hypoxemic respiratory failure # Questionable COPD history - CXR this AM showed minimal vascular congestion   - Full ventilatory support monitor and assess for spontaneous breathing trial - Fentanyl and Precedex gtt for sedation. Will add Versed 1-2 mg q2h PRN  - Start trickle feeds today  - VAP   # Elevated cardiac enzymes, NSTEMI  - Troponin 9K --> 15K yesterday  - Also has newly reduced EF to 35% with RWMA  - No ST elevations on EKG yesterday - We will hold off on cardiology consult as she is not a candidate for invasive interventions at this time.  Unfortunately, we are also unable to start anticoagulation in the setting of GIB  - Will start atorvastatin   # AKI - Likely ischemic ATN and contrast nephropathy  - Unfortunately renal function is worsening, Cr 1.69 --> 2.10 - No acute indications for HD at this time  - Will continue to monitor renal function and electrolyte  - Avoid further nephrotoxins   # Atrial fibrillation  - Will continue to monitor  - Unable to start beta blocker or CCB  - Not a candidate for anticoagulation    Best practice:  Diet: NPO, starting TFs  Pain/Anxiety/Delirium protocol (if indicated): PAD VAP protocol (if indicated): per protocol DVT prophylaxis: none GI prophylaxis: PPI  BID  Glucose control: SSI Mobility: bed rest  Code Status: DNR Family Communication: family updated  Disposition: ICU  Labs   CBC: Recent Labs  Lab 09/01/19 1925 09/01/19 1925 09/01/19 2143 09/01/19 2143 09/01/19 2356 09/02/19 0013 09/02/19 0030 09/02/19 0425 09/02/19 1047 09/02/19 1437 09/02/19 2110 09/03/19 0332  WBC Not Measured   < > 10.7*   < > 15.0*  --   --  17.1*  --  15.1* 13.5* 11.7*  NEUTROABS Not Measured  --  8.7*  --   --   --   --   --   --   --   --  9.0*  HGB Not Measured   < > 5.5*   < > 11.0*   < >  --  15.2* 15.3* 14.7 14.4 13.5  HCT Not Measured   < > 20.2*   < > 34.5*   < >  --  46.3* 45.0 43.1 41.8 39.2  MCV Not Measured   < > 105.8*   < > 90.8  --   --  88.7  --  85.7 84.8 84.3  PLT Not Measured   < > 295   < > 235   < > 240 305  --  269 252 217   < > = values in this interval not displayed.    Basic Metabolic Panel: Recent Labs  Lab 09/01/19 1925 09/02/19 0013 09/02/19 0424 09/02/19 1047 09/03/19 0332  NA 141 139 140 141 141  K 4.5 4.3 4.8 4.5 3.4*  CL 108  --  108  --  111  CO2 <7*  --  17*  --  18*  GLUCOSE 140*  --  110*  --  90  BUN 66*  --  61*  --  69*  CREATININE 1.79*  --  1.63*  --  2.10*  CALCIUM 8.4*  --  7.8*  --  7.6*  MG  --   --  2.0  --  1.8  PHOS  --    --  8.6*  --  5.3*   GFR: Estimated Creatinine Clearance: 17.7 mL/min (A) (by C-G formula based on SCr of 2.1 mg/dL (H)). Recent Labs  Lab 09/01/19 2356 09/01/19 2357 09/02/19 0425 09/02/19 0545 09/02/19 1437 09/02/19 2110 09/03/19 0332  WBC   < >  --  17.1*  --  15.1* 13.5* 11.7*  LATICACIDVEN  --  7.4* 3.6* 2.6* 2.0*  --   --    < > = values in this interval not displayed.    Liver Function Tests: Recent Labs  Lab 09/01/19 1925 09/02/19 0030  AST 68* 174*  ALT 43 169*  ALKPHOS 48 41  BILITOT 0.7 1.8*  PROT 5.1* 4.2*  ALBUMIN 3.0* 2.5*   Recent Labs  Lab 09/02/19 0030  LIPASE 33   No results for input(s): AMMONIA in the last 168 hours.  ABG    Component Value Date/Time   PHART 7.239 (L) 09/02/2019 1047   PCO2ART 35.9 09/02/2019 1047   PO2ART 143.0 (H) 09/02/2019 1047   HCO3 15.4 (L) 09/02/2019 1047   TCO2 16 (L) 09/02/2019 1047   ACIDBASEDEF 11.0 (H) 09/02/2019 1047   O2SAT 99.0 09/02/2019 1047     Coagulation Profile: Recent Labs  Lab 09/01/19 1925 09/01/19 2356 09/02/19 0030 09/02/19 0424  INR >10.0* 4.4* 4.1* 3.3*    Cardiac Enzymes: No results for input(s): CKTOTAL, CKMB, CKMBINDEX, TROPONINI in the last 168 hours.  HbA1C: Hgb A1c MFr Bld  Date/Time Value Ref Range Status  06/08/2019 09:08 AM 5.0 4.8 - 5.6 % Final    Comment:    (NOTE) Pre diabetes:          5.7%-6.4% Diabetes:              >6.4% Glycemic control for   <7.0% adults with diabetes     CBG: Recent Labs  Lab 09/03/19 0501 09/03/19 0511 09/03/19 0539 09/03/19 0728 09/03/19 0731  GLUCAP 64*  50* 163* 75* 60*    PCCM Attending:   86 yo FM, massive GIB, likely NSTEMI, echo with RWMA, Acute renal failure   BP (!) 86/55   Pulse 86   Temp 98.8 F (37.1 C)   Resp (!) 30   Ht 5\' 6"  (1.676 m)   Wt 64.8 kg   SpO2 100%   BMI 23.06 kg/m   Gen: elderly FM, on mech vent Heart: irregular, s1 s2  Lungs: BL vented breaths Abd: mildly distended   Labs reviewed:  escalating trop ECHO with low ef and RWMA   A: UGIB  Acute blood loss anemia  Hemorrhagic shock  NSTEMI Acute on chronic systolic heart failure   P: Not a candidate for anticoagulation  No antiplatelets Pressor support with levophed Wean for MAP >70mmHg  Likely not a cath candidate We will ask cardiology to see but I understand there is no intervention at this time  Unable to wean from vent right now Statin started  Watch H&H  We appreciate GI and Cards input   We will update family   This patient is critically ill with multiple organ system failure; which, requires frequent high complexity decision making, assessment, support, evaluation, and titration of therapies. This was completed through the application of advanced monitoring technologies and extensive interpretation of multiple databases. During this encounter critical care time was devoted to patient care services described in this note for 32 minutes.  [facility] 09/03/2019 11:21 AM

## 2019-09-03 NOTE — Consult Note (Addendum)
Cardiology Consult    Patient ID: [id number] ALTIC; [id number]; 02/16/1930   Admit date: 09/01/2019 Date of Consult: 09/03/2019  Primary Care Provider: [name], Charles Primary Cardiologist: Tiffany Klein, Charles   Patient Profile    Tiffany Charles is a 86 y.o. female with past medical history of chronic diastolic CHF, permanent atrial fibrillation, CAD (s/p stenting of RCA in 2008 and repeat cath in 2010 showing high-grade AV stenosis with medical management recommended), HTN, HLD and pulmonary HTN who is being seen today for the evaluation of NSTEMI at the request of Dr. Valeta Harms.   History of Present Illness    Tiffany Charles was most recently admitted to [facility] from 3/16 - 08/12/2019 for evaluation of lower extremity cellulitis and was found to have PAD and underwent lower extremity angiography on 08/10/2019 with angioplasty and stent placement to the left superficial femoral and popliteal arteries along with angioplasty of the left anterior tibial artery. Was discharged to SNF on ASA 4m daily, Atorvastatin 459mdaily, Keflex, Toprol-XL 2565maily and Xarelto 79m32mily.   [facility] for evaluation of coffee-ground emesis and AMS. Was in atrial fibrillation with RVR on admission and was on 6 L [place] with saturations in the 80's. Ultimatelty required intubation. Was found to be significantly anemic with Hgb at 5.5. Occult blood positive. Lactic Acid > 11 and INR > 10. KCenEppie Charles administered while in the ED. GI evaluated the patient and she was not felt to be a candidate for EGD given her acute state.   Received 7 units pRBC's while in the ED. Levophed was initiated while in the ED due to hemorrhagic shock.   Follow-up labs on admission showed HS Troponin was elevated to 5027 with repeat values at 9549 and 15097. By most recent labs this AM, Hgb had stabilized at 13.5 with platelets at 217. Na+ 141, K+ 3.4 and creatinine 2.10 (baseline 0.7 - 0.8, at 1.89 on  admission). INR 2.9. A repeat echocardiogram was obtained and showed a reduced EF of 35-40% (previously 60-65% in 05/2019). The inferior wall and posterior wall were akinetic with HK along the mid inferoseptal segment and basal inferoseptal segment. RV function was severely reduced. Was noted to have moderate MR and mild to moderate TR.   She remains on Norepinephrine at 6 mcg/min along with IV Protonix. No longer on antibiotic therapy. Telemetry reviewed and she has been in atrial fibrillation, HR in 110's to 120's. Frequent PVC's and up to 5 beats NSVT. She is now DNR but family desires full scope of treatment at this time.    Past Medical History:  Diagnosis Date  . Angina pectoris   . Arthritis   . Chronic anticoagulation    Xarelto  . Chronic diastolic (congestive) heart failure (HCC)[place]. COPD (chronic obstructive pulmonary disease) (HCC)[place]. Coronary artery disease    a. s/p RCA stent 2008. b. cath 02/2009: 30% mLAD, 90% AV groove cx unchanged from prior, 50-60% RCA, EF 60% -> med rx.  . Depression   . Essential hypertension   . Hyperlipidemia   . Mild pulmonary hypertension (HCC)[place]25/2018  . Permanent atrial fibrillation (HCC)[place]. Skin cancer Jan 2013   squamous cell- removed  . Tricuspid regurgitation 09/17/2016    Past Surgical History:  Procedure Laterality Date  .  CATARACTS REMOVED    . ABDOMINAL HYSTERECTOMY    . CARDIAC CATHETERIZATION  03/22/2009  Started medical therapy  . CARDIOVERSION  06/05/2009   3 attempts, last attempt successful  . CORONARY ANGIOPLASTY WITH STENT PLACEMENT  2009   RCA Vision stent  . HIP SURGERY    . LOWER EXTREMITY ANGIOGRAPHY Left 08/10/2019   Procedure: Lower Extremity Angiography;  Surgeon: Tiffany Cabal, Charles;  Location: [place] [facility];  Service: Cardiovascular;  Laterality: Left;  Marland Kitchen MELANOMA EXCISION  06/06/2011   Procedure: MELANOMA EXCISION;  Surgeon: Adin Hector, Charles;  Location: [facility];  Service: General;  Laterality:  Left;  re excision squamous cell lesion left chest wall      Home Medications:  Prior to Admission medications   Medication Sig Start Date End Date Taking? Authorizing Provider  acitretin (SORIATANE) 25 MG capsule Take 25 mg by mouth daily. 06/30/19  Yes Provider, Historical, Charles  ascorbic acid (VITAMIN C) 500 MG tablet Take 500 mg by mouth daily.   Yes Provider, Historical, Charles  aspirin EC 81 MG tablet Take 81 mg by mouth daily.    Yes Provider, Historical, Charles  atorvastatin (LIPITOR) 40 MG tablet Take 1 tablet (40 mg total) by mouth daily. 07/01/19  Yes Tiffany Lima, Charles  cholecalciferol (VITAMIN D3) 25 MCG (1000 UNIT) tablet Take 1,000 Units by mouth daily.   Yes Provider, Historical, Charles  metoprolol succinate (TOPROL-XL) 25 MG 24 hr tablet Take 1 tablet (25 mg total) by mouth daily. 05/05/19  Yes Charles, Mihai, Charles  Rivaroxaban (XARELTO) 15 MG TABS tablet Take 1 tablet (15 mg total) by mouth daily before supper. Patient taking differently: Take 15 mg by mouth daily with supper.  05/05/19  Yes Charles, Mihai, Charles  vitamin B-12 (CYANOCOBALAMIN) 1000 MCG tablet Take 1,000 mcg by mouth daily.   Yes Provider, Historical, Charles    Inpatient Medications: Scheduled Meds: . atorvastatin  20 mg Oral q1800  . chlorhexidine gluconate (MEDLINE KIT)  15 mL Mouth Rinse BID  . Chlorhexidine Gluconate Cloth  6 each Topical Daily  . folic acid  1 mg Per Tube Daily  . mouth rinse  15 mL Mouth Rinse 10 times per day  . multivitamin  15 mL Per Tube Daily  . pantoprazole (PROTONIX) IV  40 mg Intravenous Q12H  . phytonadione  10 mg Per Tube Daily  . thiamine  500 mg Per Tube Daily   Continuous Infusions: . dexmedetomidine (PRECEDEX) IV infusion 0.4 mcg/kg/hr (09/03/19 1017)  . dextrose 25 mL/hr at 09/03/19 0800  . feeding supplement (VITAL AF 1.2 CAL) 1,000 mL (09/03/19 1159)  . fentaNYL infusion INTRAVENOUS 75 mcg/hr (09/03/19 0800)  . norepinephrine (LEVOPHED) Adult infusion 6 mcg/min (09/03/19 0800)    PRN Meds: docusate sodium, fentaNYL (SUBLIMAZE) injection, levalbuterol, metoprolol tartrate, midazolam, ondansetron (ZOFRAN) IV, polyethylene glycol  Allergies:    Allergies  Allergen Reactions  . Codeine Nausea And Vomiting    Social History:   Social History   Socioeconomic History  . Marital status: Widowed    Spouse name: Not on file  . Number of children: 2  . Years of education: Not on file  . Highest education level: Not on file  Occupational History  . Occupation: Retired  Tobacco Use  . Smoking status: Former Smoker    Packs/day: 0.50  . Smokeless tobacco: Never Used  Substance and Sexual Activity  . Alcohol use: No  . Drug use: No  . Sexual activity: Never  Other Topics Concern  . Not on file  Social History Narrative   Pt lives alone,  son is nearby.   Social Determinants of Health   Financial Resource Strain:   . Difficulty of Paying Living Expenses:   Food Insecurity:   . Worried About Charity fundraiser in the Last Year:   . Arboriculturist in the Last Year:   Transportation Needs:   . Film/video editor (Medical):   Marland Kitchen Lack of Transportation (Non-Medical):   Physical Activity:   . Days of Exercise per Week:   . Minutes of Exercise per Session:   Stress:   . Feeling of Stress :   Social Connections:   . Frequency of Communication with Friends and Family:   . Frequency of Social Gatherings with Friends and Family:   . Attends Religious Services:   . Active Member of Clubs or Organizations:   . Attends Archivist Meetings:   Marland Kitchen Marital Status:   Intimate Partner Violence:   . Fear of Current or Ex-Partner:   . Emotionally Abused:   Marland Kitchen Physically Abused:   . Sexually Abused:      Family History:    Family History  Problem Relation Age of Onset  . Stroke Mother   . Heart disease Mother   . Coronary artery disease Father   . Heart disease Father   . Stroke Sister   . Heart disease Brother   . Stroke Sister   . Stroke  Sister       Review of Systems    Unable to be obtained. Patient currently intubated and sedated.   Physical Exam/Data    Vitals:   09/03/19 0900 09/03/19 1000 09/03/19 1100 09/03/19 1159  BP: 99/69 (!) 86/55 104/72   Pulse: (!) 125 86 (!) 57   Resp: (!) 25 (!) 30 (!) 30   Temp: 99 F (37.2 C) 98.8 F (37.1 C) 98.2 F (36.8 C)   TempSrc:      SpO2: 96% 100% 98% 100%  Weight:      Height:        Intake/Output Summary (Last 24 hours) at 09/03/2019 1320 Last data filed at 09/03/2019 0800 Gross per 24 hour  Intake 2096.01 ml  Output 586 ml  Net 1510.01 ml   Filed Weights   09/01/19 2111 09/02/19 0431 09/03/19 0500  Weight: 54 kg 62 kg 64.8 kg   Body mass index is 23.06 kg/m.   General: Elderly female, currently intubated and sedated.  Psych: Sedated.  HEENT: Intubated.   Neck: Supple without bruits or JVD. Lungs:  Resp regular and unlabored, decreased sounds along bases. Heart: Tachycardiac, irregularly irregular. no s3, s4, or murmurs. Abdomen: Soft, non-tender, non-distended, BS + x 4.  Extremities: No clubbing or cyanosis. 1+ pitting edema bilaterally. DP/PT/Radials 2+ and equal bilaterally.   EKG:  The EKG was personally reviewed and demonstrates: Atrial fibrillation with RVR, HR 148 with nonspecific IVCD and ST depression along anterolateral leads. Improved when compared to tracing from 09/01/2019.  Telemetry:  Telemetry was personally reviewed and demonstrates: Atrial fibrillation, HR in 110's to 120's. Frequent PVC's and up to 5 beats NSVT.   Labs/Studies     Relevant CV Studies:  Echocardiogram: 09/02/2019 1. Left ventricular ejection fraction, by estimation, is 35 to 40%. The  left ventricle has moderately decreased function. The left ventricle  demonstrates regional wall motion abnormalities (see scoring  diagram/findings for description). There is mild  concentric left ventricular hypertrophy. Left ventricular diastolic  function could not be  evaluated.  2. Right ventricular systolic function is severely  reduced. The right  ventricular size is mildly enlarged. There is moderately elevated  pulmonary artery systolic pressure. The estimated right ventricular  systolic pressure is 12.8 mmHg.  3. Left atrial size was severely dilated.  4. Right atrial size was severely dilated.  5. The mitral valve is normal in structure. Moderate mitral valve  regurgitation.  6. Tricuspid valve regurgitation is mild to moderate.  7. The aortic valve is normal in structure. Aortic valve regurgitation is  not visualized. Mild aortic valve sclerosis is present, with no evidence  of aortic valve stenosis.  8. The inferior vena cava is dilated in size with <50% respiratory  variability, suggesting right atrial pressure of 15 mmHg.   Laboratory Data:  Chemistry Recent Labs  Lab 09/01/19 1925 09/02/19 0013 09/02/19 0424 09/02/19 1047 09/03/19 0332  NA 141   < > 140 141 141  K 4.5   < > 4.8 4.5 3.4*  CL 108  --  108  --  111  CO2 <7*  --  17*  --  18*  GLUCOSE 140*  --  110*  --  90  BUN 66*  --  61*  --  69*  CREATININE 1.79*  --  1.63*  --  2.10*  CALCIUM 8.4*  --  7.8*  --  7.6*  GFRNONAA 25*  --  28*  --  21*  GFRAA 29*  --  32*  --  24*  ANIONGAP  --   --  15  --  12   < > = values in this interval not displayed.    Recent Labs  Lab 09/01/19 1925 09/02/19 0030  PROT 5.1* 4.2*  ALBUMIN 3.0* 2.5*  AST 68* 174*  ALT 43 169*  ALKPHOS 48 41  BILITOT 0.7 1.8*   Hematology Recent Labs  Lab 09/02/19 1437 09/02/19 2110 09/03/19 0332  WBC 15.1* 13.5* 11.7*  RBC 5.03 4.93 4.65  HGB 14.7 14.4 13.5  HCT 43.1 41.8 39.2  MCV 85.7 84.8 84.3  MCH 29.2 29.2 29.0  MCHC 34.1 34.4 34.4  RDW 16.6* 16.8* 16.9*  PLT 269 252 217   Cardiac EnzymesNo results for input(s): TROPONINI in the last 168 hours. No results for input(s): TROPIPOC in the last 168 hours.  BNPNo results for input(s): BNP, PROBNP in the last 168 hours.    DDimer  Recent Labs  Lab 09/02/19 0030  DDIMER 0.39    Radiology/Studies:  CT Head Wo Contrast  Result Date: 09/01/2019 CLINICAL DATA:  Status post trauma. EXAM: CT HEAD WITHOUT CONTRAST TECHNIQUE: Contiguous axial images were obtained from the base of the skull through the vertex without intravenous contrast. COMPARISON:  June 07, 2019 FINDINGS: Brain: There is moderate severity cerebral atrophy with widening of the extra-axial spaces and ventricular dilatation. There are areas of decreased attenuation within the white matter tracts of the supratentorial brain, consistent with microvascular disease changes. Vascular: No hyperdense vessel or unexpected calcification. Skull: Normal. Negative for fracture or focal lesion. Sinuses/Orbits: There is mild bilateral ethmoid sinus mucosal thickening. Other: Endotracheal and nasogastric tubes are seen. IMPRESSION: 1. No acute intracranial abnormality. 2. Moderate severity cerebral atrophy and microvascular disease changes of the supratentorial brain. 3. Mild bilateral ethmoid sinus disease. Electronically Signed   By: Tiffany Charles M.D.   On: 09/01/2019 23:46   CT Cervical Spine Wo Contrast  Result Date: 09/01/2019 CLINICAL DATA:  86 year old female with trauma. EXAM: CT CERVICAL SPINE WITHOUT CONTRAST TECHNIQUE: Multidetector CT imaging of the cervical spine was  performed without intravenous contrast. Multiplanar CT image reconstructions were also generated. COMPARISON:  None. FINDINGS: Alignment: No acute subluxation. Skull base and vertebrae: No acute fracture.  Osteopenia. Soft tissues and spinal canal: No prevertebral fluid or swelling. No visible canal hematoma. Disc levels:  Multilevel degenerative changes. Upper chest: Emphysema. Diffuse interstitial and interlobular septal thickening, likely edema. Clinical correlation is recommended. Other: An endotracheal and an enteric tube are partially visualized. The endotracheal tube is above the carina.  Bilateral carotid bulb calcified plaques. A venous line is noted over the right supraclavicular region. IMPRESSION: No acute/traumatic cervical spine pathology. Electronically Signed   By: Tiffany Charles M.D.   On: 09/01/2019 23:44   DG Chest Port 1 View  Result Date: 09/03/2019 CLINICAL DATA:  Respiratory failure. EXAM: PORTABLE CHEST 1 VIEW COMPARISON:  09/02/2019 FINDINGS: Enteric tube courses into the region of the stomach as tip is not visualized, although side-port is at the level of the gastroesophageal junction. Left IJ central venous catheter has tip obliquely oriented over the SVC at the level of the carina. Endotracheal tube has tip 5.3 cm above the carina. Lungs are adequately inflated as patient is slightly rotated to the left. Subtle hazy prominence of the perihilar vessels suggesting minimal vascular congestion. No focal lobar consolidation or effusion. Stable borderline cardiomegaly. IMPRESSION: 1. Stable borderline cardiomegaly with suggestion of minimal vascular congestion. 2.  Tubes and lines as described. Electronically Signed   By: Marin Olp M.D.   On: 09/03/2019 08:41   DG Chest Port 1 View  Result Date: 09/02/2019 CLINICAL DATA:  Central catheter placement.  Hypoxia. EXAM: PORTABLE CHEST 1 VIEW COMPARISON:  September 02, 2019 study obtained earlier in the day FINDINGS: Central catheter tip in superior vena cava. Endotracheal tube tip 5.1 cm above the carina. Nasogastric tube tip in proximal stomach with side port at gastroesophageal junction. No pneumothorax. Lungs are clear. Heart mildly enlarged. Pulmonary vascularity normal. No adenopathy. There is aortic atherosclerosis. No bone lesions. IMPRESSION: Tube and catheter positions as described without pneumothorax. Stable cardiomegaly. Lungs clear. Aortic Atherosclerosis (ICD10-I70.0). Electronically Signed   By: Tiffany Charles M.D.   On: 09/02/2019 12:36   DG Chest Port 1 View  Result Date: 09/02/2019 CLINICAL DATA:   Intubated. EXAM: PORTABLE CHEST 1 VIEW COMPARISON:  One-view chest x-ray 09/01/2019 FINDINGS: Heart size is normal scratched at the heart is enlarged. Endotracheal tube is stable. Side port of the NG tube is just beyond the GE junction. Mild pulmonary vascular congestion is present. Bibasilar airspace disease and small effusions have increased slightly. IMPRESSION: 1. Stable cardiomegaly and mild pulmonary vascular congestion. 2. Increasing bibasilar airspace disease and small effusions. While this likely reflects atelectasis, infection is not excluded. Electronically Signed   By: Tiffany Charles M.D.   On: 09/02/2019 07:52   DG Chest Port 1 View  Result Date: 09/01/2019 CLINICAL DATA:  Status post intubation. EXAM: PORTABLE CHEST 1 VIEW COMPARISON:  June 13, 2019 FINDINGS: An endotracheal tube is seen with its distal tip approximately 8.0 cm from the carina. A nasogastric tube is noted with its distal end extending below the level of the diaphragm. Mild, chronic appearing increased lung markings are seen bilaterally. There is no evidence of a pleural effusion or pneumothorax. The cardiac silhouette is markedly enlarged and unchanged in size. The visualized skeletal structures are unremarkable. IMPRESSION: 1. Endotracheal tube and nasogastric tube positioning, as described above. 2. Chronic appearing increased lung markings without evidence of acute or active cardiopulmonary disease. Electronically Signed  By: Tiffany Charles M.D.   On: 09/01/2019 21:43   DG Abd Portable 1V  Result Date: 09/02/2019 CLINICAL DATA:  OG tube placement EXAM: PORTABLE ABDOMEN - 1 VIEW COMPARISON:  None. FINDINGS: The OG tube projects over the gastric body. There is a large amount of stool at the level of the rectum and cecum. The bowel gas pattern appears to be nonobstructive. There is no pneumatosis or free air. IMPRESSION: 1. OG tube projects over the gastric body. 2. Large amount of stool at the level of the rectum  and cecum. Electronically Signed   By: Tiffany Charles M.D.   On: 09/02/2019 00:57   ECHOCARDIOGRAM COMPLETE  Result Date: 09/02/2019    ECHOCARDIOGRAM REPORT   Patient Name:   ALMARIE KURDZIEL Date of Exam: 09/02/2019 Medical Rec #:  [id number]      Height:       66.0 in Accession #:    [id number]     Weight:       136.7 lb Date of Birth:  1929/07/21     BSA:          1.701 m Patient Age:    35 years       BP:           83/68 mmHg Patient Gender: F              HR:           115 bpm. Exam Location:  Inpatient Procedure: 2D Echo, Cardiac Doppler and Color Doppler Indications:    Pulmonary Embolus 415.19  History:        Patient has prior history of Echocardiogram examinations, most                 recent 06/07/2019. CHF, CAD, COPD and Pulmonary HTN,                 Arrythmias:Atrial Fibrillation; Risk Factors:Dyslipidemia,                 Hypertension and Former Smoker. Tricuspid regurgitation.  Sonographer:    Tiffany Charles RDCS (AE) Referring Phys: [id number] Kandis Cocking  Sonographer Comments: Echo performed with patient supine and on artificial respirator. IMPRESSIONS  1. Left ventricular ejection fraction, by estimation, is 35 to 40%. The left ventricle has moderately decreased function. The left ventricle demonstrates regional wall motion abnormalities (see scoring diagram/findings for description). There is mild concentric left ventricular hypertrophy. Left ventricular diastolic function could not be evaluated.  2. Right ventricular systolic function is severely reduced. The right ventricular size is mildly enlarged. There is moderately elevated pulmonary artery systolic pressure. The estimated right ventricular systolic pressure is 12.4 mmHg.  3. Left atrial size was severely dilated.  4. Right atrial size was severely dilated.  5. The mitral valve is normal in structure. Moderate mitral valve regurgitation.  6. Tricuspid valve regurgitation is mild to moderate.  7. The aortic valve is normal in structure.  Aortic valve regurgitation is not visualized. Mild aortic valve sclerosis is present, with no evidence of aortic valve stenosis.  8. The inferior vena cava is dilated in size with <50% respiratory variability, suggesting right atrial pressure of 15 mmHg. Comparison(s): Prior images reviewed side by side. The left ventricular function is significantly worse. The right ventricular systolic function is worse. The left ventricular wall motion abnormalities are new. FINDINGS  Left Ventricle: Left ventricular ejection fraction, by estimation, is 35 to 40%. The left ventricle has moderately decreased function. The  left ventricle demonstrates regional wall motion abnormalities. The left ventricular internal cavity size was normal in size. There is mild concentric left ventricular hypertrophy. Left ventricular diastolic function could not be evaluated due to atrial fibrillation. Left ventricular diastolic function could not be evaluated.  LV Wall Scoring: The inferior wall and posterior wall are akinetic. The mid inferoseptal segment and basal inferoseptal segment are hypokinetic. The entire anterior wall, antero-lateral wall, entire anterior septum, and entire apex are normal. Right Ventricle: The right ventricular size is mildly enlarged. No increase in right ventricular wall thickness. Right ventricular systolic function is severely reduced. There is moderately elevated pulmonary artery systolic pressure. The tricuspid regurgitant velocity is 2.78 m/s, and with an assumed right atrial pressure of 15 mmHg, the estimated right ventricular systolic pressure is 76.1 mmHg. Left Atrium: Left atrial size was severely dilated. Right Atrium: Right atrial size was severely dilated. Prominent Eustachian valve. Pericardium: There is no evidence of pericardial effusion. Mitral Valve: The mitral valve is normal in structure. There is mild thickening of the mitral valve leaflet(s). Moderate mitral valve regurgitation, with  centrally-directed jet. Tricuspid Valve: The tricuspid valve is normal in structure. Tricuspid valve regurgitation is mild to moderate. Aortic Valve: The aortic valve is normal in structure. Aortic valve regurgitation is not visualized. Mild aortic valve sclerosis is present, with no evidence of aortic valve stenosis. Aortic valve mean gradient measures 1.0 mmHg. Aortic valve peak gradient measures 1.9 mmHg. Aortic valve area, by VTI measures 1.50 cm. Pulmonic Valve: The pulmonic valve was grossly normal. Pulmonic valve regurgitation is not visualized. Aorta: The aortic root and ascending aorta are structurally normal, with no evidence of dilitation. Venous: The inferior vena cava is dilated in size with less than 50% respiratory variability, suggesting right atrial pressure of 15 mmHg. IAS/Shunts: No atrial level shunt detected by color flow Doppler.  LEFT VENTRICLE PLAX 2D LVIDd:         3.86 cm LVIDs:         3.28 cm LV PW:         1.36 cm LV IVS:        1.31 cm LVOT diam:     1.70 cm LV SV:         14 LV SV Index:   8 LVOT Area:     2.27 cm  LV Volumes (MOD) LV vol d, MOD A2C: 54.6 ml LV vol d, MOD A4C: 54.7 ml LV vol s, MOD A2C: 37.0 ml LV vol s, MOD A4C: 38.7 ml LV SV MOD A2C:     17.6 ml LV SV MOD A4C:     54.7 ml LV SV MOD BP:      19.1 ml RIGHT VENTRICLE            IVC RV Basal diam:  3.38 cm    IVC diam: 1.95 cm RV Mid diam:    2.48 cm RV S prime:     4.05 cm/s TAPSE (M-mode): 1.2 cm LEFT ATRIUM              Index       RIGHT ATRIUM           Index LA diam:        4.60 cm  2.70 cm/m  RA Area:     25.30 cm LA Vol (A2C):   102.0 ml 59.96 ml/m RA Volume:   81.30 ml  47.79 ml/m LA Vol (A4C):   85.8 ml  50.44 ml/m LA Biplane Vol:  93.8 ml  55.14 ml/m  AORTIC VALVE AV Area (Vmax):    1.41 cm AV Area (Vmean):   1.37 cm AV Area (VTI):     1.50 cm AV Vmax:           68.57 cm/s AV Vmean:          48.967 cm/s AV VTI:            0.090 m AV Peak Grad:      1.9 mmHg AV Mean Grad:      1.0 mmHg LVOT Vmax:          42.50 cm/s LVOT Vmean:        29.520 cm/s LVOT VTI:          0.060 m LVOT/AV VTI ratio: 0.66  AORTA Ao Root diam: 2.80 cm Ao Asc diam:  2.90 cm MR Peak grad:    88.0 mmHg   TRICUSPID VALVE MR Mean grad:    50.0 mmHg   TR Peak grad:   30.9 mmHg MR Vmax:         469.00 cm/s TR Vmax:        278.00 cm/s MR Vmean:        322.0 cm/s MR PISA:         2.26 cm    SHUNTS MR PISA Eff ROA: 15 mm      Systemic VTI:  0.06 m MR PISA Radius:  0.60 cm     Systemic Diam: 1.70 cm Tiffany Gobble Croitoru Charles Electronically signed by Tiffany Charles Signature Date/Time: 09/02/2019/1:09:35 PM    Final    CT Angio Abd/Pel w/ and/or w/o  Result Date: 09/01/2019 CLINICAL DATA:  GI bleed. Coffee ground emesis. EXAM: CTA ABDOMEN AND PELVIS WITHOUT AND WITH CONTRAST TECHNIQUE: Multidetector CT imaging of the abdomen and pelvis was performed using the standard protocol during bolus administration of intravenous contrast. Multiplanar reconstructed images and MIPs were obtained and reviewed to evaluate the vascular anatomy. CONTRAST:  29m OMNIPAQUE IOHEXOL 350 MG/ML SOLN COMPARISON:  None. FINDINGS: VASCULAR Aorta: There are atherosclerotic changes throughout the visualized abdominal aorta without evidence for an aneurysm or dissection. Celiac: There is moderate narrowing at the origin of the celiac axis. SMA: There is moderate narrowing at the origin of the SMA. Renals: Both renal arteries appears to be significantly attenuated. IMA: Patent without evidence of aneurysm, dissection, vasculitis or significant stenosis. Inflow: Atherosclerotic changes are noted of the bilateral common iliac arteries resulting in mild-to-moderate narrowing bilaterally. Proximal Outflow: There are atherosclerotic changes throughout the right CFA and SFA resulting in mild-to-moderate narrowing. There is a patent SFA stent on the left. Veins: No obvious venous abnormality within the limitations of this arterial phase study. Portal vein is patent. The splenic vein is  patent. Review of the MIP images confirms the above findings. NON-VASCULAR Lower chest: There are small bilateral pleural effusions. There is evidence for vascular congestion.The heart size is significantly enlarged. There is reflux of contrast into the IVC. Hepatobiliary: There are coarse calcifications within the right hepatic lobe. There is periportal edema. There is diffuse gallbladder wall thickening.There is no biliary ductal dilation. Pancreas: Peripancreatic fat stranding and free fluid is noted. The pancreas appears somewhat edematous. Spleen: Unremarkable. Adrenals/Urinary Tract: --Adrenal glands: Unremarkable. --Right kidney/ureter: No hydronephrosis or radiopaque kidney stones. --Left kidney/ureter: No hydronephrosis or radiopaque kidney stones. --Urinary bladder: The bladder is decompressed with a Foley catheter and therefore is poorly evaluated. Stomach/Bowel: --Stomach/Duodenum: In NG tube courses into the stomach with  the tip terminating near the gastric fundus. There is no evidence for active extravasation into the gastric lumen. --Small bowel: Unremarkable. --Colon: There is a large amount of stool at the level of the patient's rectum. There is a moderate amount of stool throughout the remaining portions of the colon. There is a moderate amount of stool at the level of the cecum. --Appendix: Normal. Lymphatic: --No retroperitoneal lymphadenopathy. --No mesenteric lymphadenopathy. --No pelvic or inguinal lymphadenopathy. Reproductive: Status post hysterectomy. No adnexal mass. Other: There is a small volume of ascites in the abdomen and pelvis. Mild diffuse body wall edema is noted. Musculoskeletal. No acute displaced fractures. IMPRESSION: 1. No evidence for active GI bleeding on this study. 2. Significantly attenuated mesenteric vasculature which is felt to be secondary to hypotension and vasopressor usage. 3. Peripancreatic fat stranding and free fluid raises concern for underlying interstitial  pancreatitis. Correlation with lipase is recommended. There is no evidence for pancreatic necrosis. 4. Periportal edema which is nonspecific and can be seen in patients with hepatitis or may be secondary to aggressive volume resuscitation. 5. Diffuse gallbladder wall thickening which may be secondary to the presence of ascites, heart failure, or acute hepatitis. If there is clinical concern for acute cholecystitis, follow-up with ultrasound is recommended. 6. Probable developing anasarca and small bilateral pleural effusions. 7. Cardiomegaly with reflux of contrast into the IVC consistent with underlying cardiac dysfunction. 8. Large amount of stool at the level of the patient's rectum. There is a moderate amount of stool throughout the remaining portions of the colon. Aortic Atherosclerosis (ICD10-I70.0). Electronically Signed   By: Tiffany Charles M.D.   On: 09/01/2019 23:49     Assessment & Plan    1. NSTEMI in the setting of Hemorrhagic Shock/ New Cardiomyopathy - currently admitted with hemorrhagic shock in the setting of GI bleed. Hgb at 5.5 on admission and Lactic Acid > 11 and INR > 10. Tiffany Charles was administered while in the ED. Not felt to be a candidate for GI procedures at this time.  - Given dynamic EKG changes on admission, HS Troponin values were obtained and have been elevated at [place], [place] and [address]. Echo shows a reduced EF of 35-40% (previously 60-65% in 05/2019). The inferior wall and posterior wall were akinetic with HK along the mid inferoseptal segment and basal inferoseptal segment. - she is not a candidate for ischemic evaluation at this time given her anemia and GIB as she would not be a candidate for DAPT. Would plan to restart low-dose BB if BP improves. Not on ACE-I/ARB/ARNI due to hypotension and AKI. She is critically ill and status remains tenuous. She is DNR.   2. CAD - she is s/p stenting of RCA in 2008 and repeat cath in 2010 showing high-grade AV stenosis with medical  management recommended. - ASA held given GIB. She has been restarted on statin therapy with Atorvastatin 45m daily.  3. PAD - s/p lower extremity angiography on 08/10/2019 with angioplasty and stent placement to the left superficial femoral and popliteal arteries along with angioplasty of the left anterior tibial artery.  - followed by Vascular Surgery as an outpatient. ASA held. Continue statin therapy.   4. Permanent Atrial Fibrillation - rates have been elevated in the 110's to 120's. On Toprol-XL 2102mdaily prior to admission which is currently held due to hypotension.  - Xarelto held given GIB.   5. AKI - baseline creatinine 0.7 - 0.8. Elevated to 1.89 on admission and at 2.10 today.   6.  Acute Hypoxic Respiratory Failure - remains intubated and sedated at this time. Further management per Critical Care.     For questions or updates, please contact [place] Please consult [email or web address] for contact info under Cardiology/STEMI.  Signed, Tiffany Heritage, PA-C 09/03/2019, 1:20 PM Pager: [phone]   Attending Note:   The patient was seen and examined.  Agree with assessment and plan as noted above.  Changes made to the above note as needed.  Patient seen and independently examined with  Tiffany Charles, Tiffany Charles .   We discussed all aspects of the encounter. I agree with the assessment and plan as stated above.      I have spent a total of 40 minutes with patient reviewing hospital  notes , telemetry, EKGs, labs and examining patient as well as establishing an assessment and plan that was discussed with the patient. > 50% of time was spent in direct patient care.  1.  Evidence of non-ST segment elevation myocardial infarction: The patient has known coronary artery disease with stenting of her RCA in 2008.  She now presents with profound anemia and CHF.  She has ischemic changes on her EKG but repeat heart catheterization is not an option at this point given her anemia,,  ongoing GI bleed, acute renal failure.  2.  Acute renal insufficiency: Patient has acute renal failure likely due to hypoperfusion.  3.  Atrial fibrillation: She has been on Xarelto.  She presented with a hemoglobin of 5.  She remains in rapid atrial fibrillation.  4.  Acute combined systolic and diastolic congestive heart failure:   Mrs. Oelke presents with multiorgan failure.  She has been seen by the GI team and is and is not a candidate for EGD or colonoscopy.  She also is not a candidate for any invasive cardiac procedures.  I discussed these issues with her grandson, Amirra Herling   [phone])  with explained to him that her prognosis is poor and that we do not have any options except for supportive care at this point.  He understands and will try to gather family together.  Unfortunately , we do not have any good options for her    [place] will sign off.        Thayer Headings, Brooke Bonito., Charles, [facility] 09/03/2019, 1:50 PM [address]  [place] Pager [phone]

## 2019-09-03 NOTE — Progress Notes (Signed)
Hypoglycemic Event  CBG: 50  Treatment: D50 and restart D10   Symptoms: None  Follow-up CBG: Time: 0541 CBG Result: 164  Possible Reasons for Event: NPO   Comments/MD notified: Restart D10     Antonietta Breach

## 2019-09-03 NOTE — Progress Notes (Signed)
[facility] Progress Note Patient Name: AYDE SORACE DOB: 1929-08-25 MRN: [id number]   Date of Service  09/03/2019  HPI/Events of Note  Blood sugar 357  eICU Interventions  D 10 discontinued, Blood sugar monitoring per protocol Q 4 hours with SSI coverage ordered.        Kerry Kass Shreeya Recendiz 09/03/2019, 4:58 AM

## 2019-09-03 NOTE — Progress Notes (Signed)
eLink Physician-Brief Progress Note Patient Name: NELSA KUTZLER DOB: [birth year] MRN: [id number]   Date of Service  09/03/2019  HPI/Events of Note  Pt needs order for PRN sedation medications while on the ventilator.  eICU Interventions  PRN Fentanyl ordered        Ashleyanne Hemmingway U Macrina Lehnert 09/03/2019, 2:14 AM

## 2019-09-03 NOTE — Progress Notes (Addendum)
Nutrition Follow Up  DOCUMENTATION CODES:   Not applicable  INTERVENTION:   Tube feeding:  -Vital AF @ 20 ml/hr via OGT -Increase per toleration to goal rate of 45 ml/hr (1080 ml)  At goal TF provides: 1296 kcals, 81 grams protein, 876 ml free water.   NUTRITION DIAGNOSIS:   Inadequate oral intake related to inability to eat as evidenced by NPO status.  GOAL:   Patient will meet greater than or equal to 90% of their needs  MONITOR:   Vent status, I & O's, Labs  REASON FOR ASSESSMENT:   Ventilator    ASSESSMENT:   86 yo female admitted with hemorrhagic shock due to GI hemorrhage. Required intubation on admission. PMH includes A fib, COPD, CAD, HTN.   GI advises against invasive intervention as she is critically ill and DNR. Poor prognosis noted. Weaning pressors. Hypoglycemic over night, D10 started. Okay to start feeding per CCM.   Admission weight: 54 kg Current weight: 64.8 kg   Patient remains ntubated on ventilator support MV: 10.8 L/min Temp (24hrs), Avg:98.6 F (37 C), Min:98.1 F (36.7 C), Max:99.3 F (37.4 C)   I/O: +6,223 ml since admit  UOP: 615 ml x 24 hrs   Drips: precedex, D10 @ 25 ml/hr, levophed Medications: folic acid, MVI, Vitamin K, thiamine  Labs: K 3.4 (L) Phosphorus 5.3 (H) CBG 44-184 Cr 2.10- trending up  Diet Order:   Diet Order            Diet NPO time specified  Diet effective now              EDUCATION NEEDS:   Not appropriate for education at this time  Skin:  Skin Assessment: Skin Integrity Issues: Skin Integrity Issues:: Other (Comment) Other: non pressure wound L foot  Last BM:  4/9  Height:   Ht Readings from Last 1 Encounters:  09/01/19 5\' 6"  (1.676 m)    Weight:   Wt Readings from Last 1 Encounters:  09/03/19 64.8 kg    Ideal Body Weight:  59.1 kg  BMI:  Body mass index is 23.06 kg/m.  Estimated Nutritional Needs:   Kcal:  1254 kcal  Protein:  80-100 gm  Fluid:  >/= 1.5 L   Mariana Single RD, LDN Clinical Nutrition Pager listed in [place]

## 2019-09-03 NOTE — Progress Notes (Signed)
Progress Note   Subjective  No significant output from OG per nursing. Small smear of old black blood per rectum, nothing significant. Hgb has been relatively stable. She remains intubated, agitated, and on pressors with AF with RVR.   Objective   Vital signs in last 24 hours: Temp:  [97.7 F (36.5 C)-99.3 F (37.4 C)] 98.4 F (36.9 C) (04/10 0800) Pulse Rate:  [39-166] 109 (04/10 0800) Resp:  [18-31] 27 (04/10 0800) BP: (84-112)/(56-94) 97/64 (04/10 0800) SpO2:  [89 %-100 %] 100 % (04/10 0850) FiO2 (%):  [40 %] 40 % (04/10 0850) Weight:  [64.8 kg] 64.8 kg (04/10 0500) Last BM Date: 09/02/19 General:    white female intubated Heart: AF with RVR Abdomen:  Soft,nondistended.   Intake/Output from previous day: 04/09 0701 - 04/10 0700 In: 2504.8 [I.V.:2330.8; Blood:74; IV Piggyback:100] Out: 716 [Urine:615; Emesis/NG output:100; Stool:1] Intake/Output this shift: Total I/O In: 187.2 [I.V.:187.2] Out: -   Lab Results: Recent Labs    09/02/19 1437 09/02/19 2110 09/03/19 0332  WBC 15.1* 13.5* 11.7*  HGB 14.7 14.4 13.5  HCT 43.1 41.8 39.2  PLT 269 252 217   BMET Recent Labs    09/01/19 1925 09/02/19 0013 09/02/19 0424 09/02/19 1047 09/03/19 0332  NA 141   < > 140 141 141  K 4.5   < > 4.8 4.5 3.4*  CL 108  --  108  --  111  CO2 <7*  --  17*  --  18*  GLUCOSE 140*  --  110*  --  90  BUN 66*  --  61*  --  69*  CREATININE 1.79*  --  1.63*  --  2.10*  CALCIUM 8.4*  --  7.8*  --  7.6*   < > = values in this interval not displayed.   LFT Recent Labs    09/02/19 0030  PROT 4.2*  ALBUMIN 2.5*  AST 174*  ALT 169*  ALKPHOS 41  BILITOT 1.8*  BILIDIR 0.7*  IBILI 1.1*   PT/INR Recent Labs    09/02/19 0030 09/02/19 0424  LABPROT 39.6* 33.9*  INR 4.1* 3.3*    Studies/Results: CT Head Wo Contrast  Result Date: 09/01/2019 CLINICAL DATA:  Status post trauma. EXAM: CT HEAD WITHOUT CONTRAST TECHNIQUE: Contiguous axial images were obtained from the base  of the skull through the vertex without intravenous contrast. COMPARISON:  June 07, 2019 FINDINGS: Brain: There is moderate severity cerebral atrophy with widening of the extra-axial spaces and ventricular dilatation. There are areas of decreased attenuation within the white matter tracts of the supratentorial brain, consistent with microvascular disease changes. Vascular: No hyperdense vessel or unexpected calcification. Skull: Normal. Negative for fracture or focal lesion. Sinuses/Orbits: There is mild bilateral ethmoid sinus mucosal thickening. Other: Endotracheal and nasogastric tubes are seen. IMPRESSION: 1. No acute intracranial abnormality. 2. Moderate severity cerebral atrophy and microvascular disease changes of the supratentorial brain. 3. Mild bilateral ethmoid sinus disease. Electronically Signed   By: Virgina Norfolk M.D.   On: 09/01/2019 23:46   CT Cervical Spine Wo Contrast  Result Date: 09/01/2019 CLINICAL DATA:  86 year old female with trauma. EXAM: CT CERVICAL SPINE WITHOUT CONTRAST TECHNIQUE: Multidetector CT imaging of the cervical spine was performed without intravenous contrast. Multiplanar CT image reconstructions were also generated. COMPARISON:  None. FINDINGS: Alignment: No acute subluxation. Skull base and vertebrae: No acute fracture.  Osteopenia. Soft tissues and spinal canal: No prevertebral fluid or swelling. No visible canal hematoma. Disc levels:  Multilevel degenerative changes. Upper chest: Emphysema. Diffuse interstitial and interlobular septal thickening, likely edema. Clinical correlation is recommended. Other: An endotracheal and an enteric tube are partially visualized. The endotracheal tube is above the carina. Bilateral carotid bulb calcified plaques. A venous line is noted over the right supraclavicular region. IMPRESSION: No acute/traumatic cervical spine pathology. Electronically Signed   By: Anner Crete M.D.   On: 09/01/2019 23:44   DG Chest Port 1  View  Result Date: 09/03/2019 CLINICAL DATA:  Respiratory failure. EXAM: PORTABLE CHEST 1 VIEW COMPARISON:  09/02/2019 FINDINGS: Enteric tube courses into the region of the stomach as tip is not visualized, although side-port is at the level of the gastroesophageal junction. Left IJ central venous catheter has tip obliquely oriented over the SVC at the level of the carina. Endotracheal tube has tip 5.3 cm above the carina. Lungs are adequately inflated as patient is slightly rotated to the left. Subtle hazy prominence of the perihilar vessels suggesting minimal vascular congestion. No focal lobar consolidation or effusion. Stable borderline cardiomegaly. IMPRESSION: 1. Stable borderline cardiomegaly with suggestion of minimal vascular congestion. 2.  Tubes and lines as described. Electronically Signed   By: Marin Olp M.D.   On: 09/03/2019 08:41   DG Chest Port 1 View  Result Date: 09/02/2019 CLINICAL DATA:  Central catheter placement.  Hypoxia. EXAM: PORTABLE CHEST 1 VIEW COMPARISON:  September 02, 2019 study obtained earlier in the day FINDINGS: Central catheter tip in superior vena cava. Endotracheal tube tip 5.1 cm above the carina. Nasogastric tube tip in proximal stomach with side port at gastroesophageal junction. No pneumothorax. Lungs are clear. Heart mildly enlarged. Pulmonary vascularity normal. No adenopathy. There is aortic atherosclerosis. No bone lesions. IMPRESSION: Tube and catheter positions as described without pneumothorax. Stable cardiomegaly. Lungs clear. Aortic Atherosclerosis (ICD10-I70.0). Electronically Signed   By: Lowella Grip III M.D.   On: 09/02/2019 12:36   DG Chest Port 1 View  Result Date: 09/02/2019 CLINICAL DATA:  Intubated. EXAM: PORTABLE CHEST 1 VIEW COMPARISON:  One-view chest x-ray 09/01/2019 FINDINGS: Heart size is normal scratched at the heart is enlarged. Endotracheal tube is stable. Side port of the NG tube is just beyond the GE junction. Mild pulmonary vascular  congestion is present. Bibasilar airspace disease and small effusions have increased slightly. IMPRESSION: 1. Stable cardiomegaly and mild pulmonary vascular congestion. 2. Increasing bibasilar airspace disease and small effusions. While this likely reflects atelectasis, infection is not excluded. Electronically Signed   By: San Morelle M.D.   On: 09/02/2019 07:52   DG Chest Port 1 View  Result Date: 09/01/2019 CLINICAL DATA:  Status post intubation. EXAM: PORTABLE CHEST 1 VIEW COMPARISON:  June 13, 2019 FINDINGS: An endotracheal tube is seen with its distal tip approximately 8.0 cm from the carina. A nasogastric tube is noted with its distal end extending below the level of the diaphragm. Mild, chronic appearing increased lung markings are seen bilaterally. There is no evidence of a pleural effusion or pneumothorax. The cardiac silhouette is markedly enlarged and unchanged in size. The visualized skeletal structures are unremarkable. IMPRESSION: 1. Endotracheal tube and nasogastric tube positioning, as described above. 2. Chronic appearing increased lung markings without evidence of acute or active cardiopulmonary disease. Electronically Signed   By: Virgina Norfolk M.D.   On: 09/01/2019 21:43   DG Abd Portable 1V  Result Date: 09/02/2019 CLINICAL DATA:  OG tube placement EXAM: PORTABLE ABDOMEN - 1 VIEW COMPARISON:  None. FINDINGS: The OG tube projects over the gastric  body. There is a large amount of stool at the level of the rectum and cecum. The bowel gas pattern appears to be nonobstructive. There is no pneumatosis or free air. IMPRESSION: 1. OG tube projects over the gastric body. 2. Large amount of stool at the level of the rectum and cecum. Electronically Signed   By: Constance Holster M.D.   On: 09/02/2019 00:57   ECHOCARDIOGRAM COMPLETE  Result Date: 09/02/2019    ECHOCARDIOGRAM REPORT   Patient Name:   Tiffany Charles Date of Exam: 09/02/2019 Medical Rec #:  [id number]      Height:        66.0 in Accession #:    [id number]     Weight:       136.7 lb Date of Birth:  [birth year]     BSA:          1.701 m Patient Age:    42 years       BP:           83/68 mmHg Patient Gender: F              HR:           115 bpm. Exam Location:  Inpatient Procedure: 2D Echo, Cardiac Doppler and Color Doppler Indications:    Pulmonary Embolus 415.19  History:        Patient has prior history of Echocardiogram examinations, most                 recent 06/07/2019. CHF, CAD, COPD and Pulmonary HTN,                 Arrythmias:Atrial Fibrillation; Risk Factors:Dyslipidemia,                 Hypertension and Former Smoker. Tricuspid regurgitation.  Sonographer:    Clayton Lefort RDCS (AE) Referring Phys: [id number] Kandis Cocking  Sonographer Comments: Echo performed with patient supine and on artificial respirator. IMPRESSIONS  1. Left ventricular ejection fraction, by estimation, is 35 to 40%. The left ventricle has moderately decreased function. The left ventricle demonstrates regional wall motion abnormalities (see scoring diagram/findings for description). There is mild concentric left ventricular hypertrophy. Left ventricular diastolic function could not be evaluated.  2. Right ventricular systolic function is severely reduced. The right ventricular size is mildly enlarged. There is moderately elevated pulmonary artery systolic pressure. The estimated right ventricular systolic pressure is 123XX123 mmHg.  3. Left atrial size was severely dilated.  4. Right atrial size was severely dilated.  5. The mitral valve is normal in structure. Moderate mitral valve regurgitation.  6. Tricuspid valve regurgitation is mild to moderate.  7. The aortic valve is normal in structure. Aortic valve regurgitation is not visualized. Mild aortic valve sclerosis is present, with no evidence of aortic valve stenosis.  8. The inferior vena cava is dilated in size with <50% respiratory variability, suggesting right atrial pressure of 15 mmHg.  Comparison(s): Prior images reviewed side by side. The left ventricular function is significantly worse. The right ventricular systolic function is worse. The left ventricular wall motion abnormalities are new. FINDINGS  Left Ventricle: Left ventricular ejection fraction, by estimation, is 35 to 40%. The left ventricle has moderately decreased function. The left ventricle demonstrates regional wall motion abnormalities. The left ventricular internal cavity size was normal in size. There is mild concentric left ventricular hypertrophy. Left ventricular diastolic function could not be evaluated due to atrial fibrillation. Left ventricular diastolic function could not be  evaluated.  LV Wall Scoring: The inferior wall and posterior wall are akinetic. The mid inferoseptal segment and basal inferoseptal segment are hypokinetic. The entire anterior wall, antero-lateral wall, entire anterior septum, and entire apex are normal. Right Ventricle: The right ventricular size is mildly enlarged. No increase in right ventricular wall thickness. Right ventricular systolic function is severely reduced. There is moderately elevated pulmonary artery systolic pressure. The tricuspid regurgitant velocity is 2.78 m/s, and with an assumed right atrial pressure of 15 mmHg, the estimated right ventricular systolic pressure is 123XX123 mmHg. Left Atrium: Left atrial size was severely dilated. Right Atrium: Right atrial size was severely dilated. Prominent Eustachian valve. Pericardium: There is no evidence of pericardial effusion. Mitral Valve: The mitral valve is normal in structure. There is mild thickening of the mitral valve leaflet(s). Moderate mitral valve regurgitation, with centrally-directed jet. Tricuspid Valve: The tricuspid valve is normal in structure. Tricuspid valve regurgitation is mild to moderate. Aortic Valve: The aortic valve is normal in structure. Aortic valve regurgitation is not visualized. Mild aortic valve sclerosis is  present, with no evidence of aortic valve stenosis. Aortic valve mean gradient measures 1.0 mmHg. Aortic valve peak gradient measures 1.9 mmHg. Aortic valve area, by VTI measures 1.50 cm. Pulmonic Valve: The pulmonic valve was grossly normal. Pulmonic valve regurgitation is not visualized. Aorta: The aortic root and ascending aorta are structurally normal, with no evidence of dilitation. Venous: The inferior vena cava is dilated in size with less than 50% respiratory variability, suggesting right atrial pressure of 15 mmHg. IAS/Shunts: No atrial level shunt detected by color flow Doppler.  LEFT VENTRICLE PLAX 2D LVIDd:         3.86 cm LVIDs:         3.28 cm LV PW:         1.36 cm LV IVS:        1.31 cm LVOT diam:     1.70 cm LV SV:         14 LV SV Index:   8 LVOT Area:     2.27 cm  LV Volumes (MOD) LV vol d, MOD A2C: 54.6 ml LV vol d, MOD A4C: 54.7 ml LV vol s, MOD A2C: 37.0 ml LV vol s, MOD A4C: 38.7 ml LV SV MOD A2C:     17.6 ml LV SV MOD A4C:     54.7 ml LV SV MOD BP:      19.1 ml RIGHT VENTRICLE            IVC RV Basal diam:  3.38 cm    IVC diam: 1.95 cm RV Mid diam:    2.48 cm RV S prime:     4.05 cm/s TAPSE (M-mode): 1.2 cm LEFT ATRIUM              Index       RIGHT ATRIUM           Index LA diam:        4.60 cm  2.70 cm/m  RA Area:     25.30 cm LA Vol (A2C):   102.0 ml 59.96 ml/m RA Volume:   81.30 ml  47.79 ml/m LA Vol (A4C):   85.8 ml  50.44 ml/m LA Biplane Vol: 93.8 ml  55.14 ml/m  AORTIC VALVE AV Area (Vmax):    1.41 cm AV Area (Vmean):   1.37 cm AV Area (VTI):     1.50 cm AV Vmax:  68.57 cm/s AV Vmean:          48.967 cm/s AV VTI:            0.090 m AV Peak Grad:      1.9 mmHg AV Mean Grad:      1.0 mmHg LVOT Vmax:         42.50 cm/s LVOT Vmean:        29.520 cm/s LVOT VTI:          0.060 m LVOT/AV VTI ratio: 0.66  AORTA Ao Root diam: 2.80 cm Ao Asc diam:  2.90 cm MR Peak grad:    88.0 mmHg   TRICUSPID VALVE MR Mean grad:    50.0 mmHg   TR Peak grad:   30.9 mmHg MR Vmax:          469.00 cm/s TR Vmax:        278.00 cm/s MR Vmean:        322.0 cm/s MR PISA:         2.26 cm    SHUNTS MR PISA Eff ROA: 15 mm      Systemic VTI:  0.06 m MR PISA Radius:  0.60 cm     Systemic Diam: 1.70 cm Dani Gobble Croitoru MD Electronically signed by Sanda Klein MD Signature Date/Time: 09/02/2019/1:09:35 PM    Final    CT Angio Abd/Pel w/ and/or w/o  Result Date: 09/01/2019 CLINICAL DATA:  GI bleed. Coffee ground emesis. EXAM: CTA ABDOMEN AND PELVIS WITHOUT AND WITH CONTRAST TECHNIQUE: Multidetector CT imaging of the abdomen and pelvis was performed using the standard protocol during bolus administration of intravenous contrast. Multiplanar reconstructed images and MIPs were obtained and reviewed to evaluate the vascular anatomy. CONTRAST:  56mL OMNIPAQUE IOHEXOL 350 MG/ML SOLN COMPARISON:  None. FINDINGS: VASCULAR Aorta: There are atherosclerotic changes throughout the visualized abdominal aorta without evidence for an aneurysm or dissection. Celiac: There is moderate narrowing at the origin of the celiac axis. SMA: There is moderate narrowing at the origin of the SMA. Renals: Both renal arteries appears to be significantly attenuated. IMA: Patent without evidence of aneurysm, dissection, vasculitis or significant stenosis. Inflow: Atherosclerotic changes are noted of the bilateral common iliac arteries resulting in mild-to-moderate narrowing bilaterally. Proximal Outflow: There are atherosclerotic changes throughout the right CFA and SFA resulting in mild-to-moderate narrowing. There is a patent SFA stent on the left. Veins: No obvious venous abnormality within the limitations of this arterial phase study. Portal vein is patent. The splenic vein is patent. Review of the MIP images confirms the above findings. NON-VASCULAR Lower chest: There are small bilateral pleural effusions. There is evidence for vascular congestion.The heart size is significantly enlarged. There is reflux of contrast into the IVC.  Hepatobiliary: There are coarse calcifications within the right hepatic lobe. There is periportal edema. There is diffuse gallbladder wall thickening.There is no biliary ductal dilation. Pancreas: Peripancreatic fat stranding and free fluid is noted. The pancreas appears somewhat edematous. Spleen: Unremarkable. Adrenals/Urinary Tract: --Adrenal glands: Unremarkable. --Right kidney/ureter: No hydronephrosis or radiopaque kidney stones. --Left kidney/ureter: No hydronephrosis or radiopaque kidney stones. --Urinary bladder: The bladder is decompressed with a Foley catheter and therefore is poorly evaluated. Stomach/Bowel: --Stomach/Duodenum: In NG tube courses into the stomach with the tip terminating near the gastric fundus. There is no evidence for active extravasation into the gastric lumen. --Small bowel: Unremarkable. --Colon: There is a large amount of stool at the level of the patient's rectum. There is a moderate amount of stool throughout  the remaining portions of the colon. There is a moderate amount of stool at the level of the cecum. --Appendix: Normal. Lymphatic: --No retroperitoneal lymphadenopathy. --No mesenteric lymphadenopathy. --No pelvic or inguinal lymphadenopathy. Reproductive: Status post hysterectomy. No adnexal mass. Other: There is a small volume of ascites in the abdomen and pelvis. Mild diffuse body wall edema is noted. Musculoskeletal. No acute displaced fractures. IMPRESSION: 1. No evidence for active GI bleeding on this study. 2. Significantly attenuated mesenteric vasculature which is felt to be secondary to hypotension and vasopressor usage. 3. Peripancreatic fat stranding and free fluid raises concern for underlying interstitial pancreatitis. Correlation with lipase is recommended. There is no evidence for pancreatic necrosis. 4. Periportal edema which is nonspecific and can be seen in patients with hepatitis or may be secondary to aggressive volume resuscitation. 5. Diffuse  gallbladder wall thickening which may be secondary to the presence of ascites, heart failure, or acute hepatitis. If there is clinical concern for acute cholecystitis, follow-up with ultrasound is recommended. 6. Probable developing anasarca and small bilateral pleural effusions. 7. Cardiomegaly with reflux of contrast into the IVC consistent with underlying cardiac dysfunction. 8. Large amount of stool at the level of the patient's rectum. There is a moderate amount of stool throughout the remaining portions of the colon. Aortic Atherosclerosis (ICD10-I70.0). Electronically Signed   By: Constance Holster M.D.   On: 09/01/2019 23:49       Assessment / Plan:   86 y/o female admitted with massive UGI bleed, hemorrhagic shock in the setting of Xarelto - received K-centra, cryo, intubated, pressors, massive transfusion protocol. CT angio on admission without active extravasation. Troponins elevated to 15K, Echo done showing EF 35%, regional wall motion abnormalities noted and severely reduced RV function. She is in AF with RVR and remains on pressors, intubated.   She remains quite ill but has stabilized from bleeding perspective, Hgb stable on IV PPI after reversal of anticoagulation. I would continue IV PPI and supportive care. May consider endoscopy at some point in her course if she is more medically stable, but no urgency at this time and may not be needed pending her course. Please call otherwise with any recurrence of significant bleeding in the interim.    [place] Cellar, MD [facility]

## 2019-09-04 DIAGNOSIS — Z7901 Long term (current) use of anticoagulants: Secondary | ICD-10-CM | POA: Diagnosis not present

## 2019-09-04 DIAGNOSIS — K922 Gastrointestinal hemorrhage, unspecified: Secondary | ICD-10-CM | POA: Diagnosis not present

## 2019-09-04 DIAGNOSIS — R578 Other shock: Secondary | ICD-10-CM | POA: Diagnosis not present

## 2019-09-04 DIAGNOSIS — J9601 Acute respiratory failure with hypoxia: Secondary | ICD-10-CM | POA: Diagnosis not present

## 2019-09-04 DIAGNOSIS — J410 Simple chronic bronchitis: Secondary | ICD-10-CM | POA: Diagnosis not present

## 2019-09-04 LAB — BASIC METABOLIC PANEL
Anion gap: 7 (ref 5–15)
BUN: 50 mg/dL — ABNORMAL HIGH (ref 8–23)
CO2: 17 mmol/L — ABNORMAL LOW (ref 22–32)
Calcium: 7.4 mg/dL — ABNORMAL LOW (ref 8.9–10.3)
Chloride: 115 mmol/L — ABNORMAL HIGH (ref 98–111)
Creatinine, Ser: 1.67 mg/dL — ABNORMAL HIGH (ref 0.44–1.00)
GFR calc Af Amer: 32 mL/min — ABNORMAL LOW (ref >60)
GFR calc non Af Amer: 27 mL/min — ABNORMAL LOW (ref >60)
Glucose, Bld: 148 mg/dL — ABNORMAL HIGH (ref 70–99)
Potassium: 4 mmol/L (ref 3.5–5.1)
Sodium: 139 mmol/L (ref 135–145)

## 2019-09-04 LAB — PROTIME-INR
INR: 1.9 — ABNORMAL HIGH (ref 0.8–1.2)
Prothrombin Time: 21.8 seconds — ABNORMAL HIGH (ref 11.4–15.2)

## 2019-09-04 LAB — URINE CULTURE: Culture: 5000 — AB

## 2019-09-04 LAB — CBC
HCT: 38.9 % (ref 36.0–46.0)
Hemoglobin: 13.4 g/dL (ref 12.0–15.0)
MCH: 28.9 pg (ref 26.0–34.0)
MCHC: 34.4 g/dL (ref 30.0–36.0)
MCV: 83.8 fL (ref 80.0–100.0)
Platelets: 183 10*3/uL (ref 150–400)
RBC: 4.64 MIL/uL (ref 3.87–5.11)
RDW: 17.1 % — ABNORMAL HIGH (ref 11.5–15.5)
WBC: 10.9 10*3/uL — ABNORMAL HIGH (ref 4.0–10.5)
nRBC: 1.8 % — ABNORMAL HIGH (ref 0.0–0.2)

## 2019-09-04 LAB — TROPONIN I (HIGH SENSITIVITY): Troponin I (High Sensitivity): 2593 ng/L (ref <18)

## 2019-09-04 LAB — GLUCOSE, CAPILLARY
Glucose-Capillary: 127 mg/dL — ABNORMAL HIGH (ref 70–99)
Glucose-Capillary: 133 mg/dL — ABNORMAL HIGH (ref 70–99)
Glucose-Capillary: 134 mg/dL — ABNORMAL HIGH (ref 70–99)
Glucose-Capillary: 139 mg/dL — ABNORMAL HIGH (ref 70–99)
Glucose-Capillary: 142 mg/dL — ABNORMAL HIGH (ref 70–99)
Glucose-Capillary: 150 mg/dL — ABNORMAL HIGH (ref 70–99)

## 2019-09-04 LAB — COMPREHENSIVE METABOLIC PANEL
ALT: 233 U/L — ABNORMAL HIGH (ref 0–44)
AST: 181 U/L — ABNORMAL HIGH (ref 15–41)
Albumin: 2.4 g/dL — ABNORMAL LOW (ref 3.5–5.0)
Alkaline Phosphatase: 56 U/L (ref 38–126)
Anion gap: 10 (ref 5–15)
BUN: 54 mg/dL — ABNORMAL HIGH (ref 8–23)
CO2: 18 mmol/L — ABNORMAL LOW (ref 22–32)
Calcium: 7.7 mg/dL — ABNORMAL LOW (ref 8.9–10.3)
Chloride: 111 mmol/L (ref 98–111)
Creatinine, Ser: 1.85 mg/dL — ABNORMAL HIGH (ref 0.44–1.00)
GFR calc Af Amer: 28 mL/min — ABNORMAL LOW (ref >60)
GFR calc non Af Amer: 24 mL/min — ABNORMAL LOW (ref >60)
Glucose, Bld: 148 mg/dL — ABNORMAL HIGH (ref 70–99)
Potassium: 3.2 mmol/L — ABNORMAL LOW (ref 3.5–5.1)
Sodium: 139 mmol/L (ref 135–145)
Total Bilirubin: 1.3 mg/dL — ABNORMAL HIGH (ref 0.3–1.2)
Total Protein: 4.6 g/dL — ABNORMAL LOW (ref 6.5–8.1)

## 2019-09-04 LAB — HAPTOGLOBIN: Haptoglobin: 104 mg/dL (ref 41–333)

## 2019-09-04 LAB — MAGNESIUM: Magnesium: 1.8 mg/dL (ref 1.7–2.4)

## 2019-09-04 LAB — PHOSPHORUS: Phosphorus: 3.5 mg/dL (ref 2.5–4.6)

## 2019-09-04 MED ORDER — POTASSIUM CHLORIDE 20 MEQ/15ML (10%) PO SOLN
40.0000 meq | Freq: Once | ORAL | Status: AC
  Administered 2019-09-04: 10:00:00 40 meq
  Filled 2019-09-04: qty 30

## 2019-09-04 MED ORDER — CHLORHEXIDINE GLUCONATE 0.12 % MT SOLN
15.0000 mL | Freq: Two times a day (BID) | OROMUCOSAL | Status: DC
  Administered 2019-09-05 – 2019-09-27 (×34): 15 mL via OROMUCOSAL
  Filled 2019-09-04 (×39): qty 15

## 2019-09-04 MED ORDER — ACETAMINOPHEN 160 MG/5ML PO SOLN
650.0000 mg | ORAL | Status: DC | PRN
  Administered 2019-09-04: 05:00:00 650 mg
  Filled 2019-09-04 (×2): qty 20.3

## 2019-09-04 MED ORDER — ORAL CARE MOUTH RINSE
15.0000 mL | Freq: Two times a day (BID) | OROMUCOSAL | Status: DC
  Administered 2019-09-05 – 2019-09-26 (×33): 15 mL via OROMUCOSAL

## 2019-09-04 MED ORDER — MAGNESIUM SULFATE 2 GM/50ML IV SOLN
2.0000 g | Freq: Once | INTRAVENOUS | Status: AC
  Administered 2019-09-04: 10:00:00 2 g via INTRAVENOUS
  Filled 2019-09-04: qty 50

## 2019-09-04 MED ORDER — POTASSIUM CHLORIDE 10 MEQ/50ML IV SOLN: 10.0000 meq | INTRAVENOUS | Status: DC

## 2019-09-04 NOTE — Progress Notes (Addendum)
NAME:  Tiffany Charles, MRN:  [id number], DOB:  1929-12-20, LOS: 3 ADMISSION DATE:  09/01/2019, CONSULTATION DATE:  4/8 REFERRING MD:  Dr. Darl Householder, CHIEF COMPLAINT:  Hemorrhagic shock   Brief History   86 year old female on Xarelto for Atrial fibrillation admitted with hemorrhagic shock secondary to gastrointestinal hemorrhage.   History of present illness   Patient is encephalopathic and/or intubated. Therefore history has been obtained from chart review.  86 year old female with PMH as below, which is significant for Atrial fibrillation on Xarelto, COPD, CAD, and HTN. She was recently admitted to [facility] and treated for cellulitis with keflex and doxycycline. She presented to [facility] ED 4/8 after being found altered at SNF with coffee-ground emesis beside her bed. Upon arrival to the ED she was hypoxemic and hypotensive. Her mental status was poor and she ultimately required intubation. Laboratory evaluation significant for undetectable blood count and INR > 10. She was given [facility] in ED and blood transfusion was initiated. Gastroenterology has seen her in the ED and has deemed her to not presently be a candidate for endoscopy due to acuity of her condition. PCCM asked to admit.   Past Medical History   has a past medical history of Angina pectoris, Arthritis, Chronic anticoagulation, Chronic diastolic (congestive) heart failure ([place]), COPD (chronic obstructive pulmonary disease) ([place]), Coronary artery disease, Depression, Essential hypertension, Hyperlipidemia, Mild pulmonary hypertension ([place]) (09/17/2016), Permanent atrial fibrillation [facility]), Skin cancer (Jan 2013), and Tricuspid regurgitation (09/17/2016).  Significant Hospital Events   4/8 admit  Consults:  GI  Procedures:  ETT 4/8 >  Significant Diagnostic Tests:  CTA abdomen/pelvis 4/8 > no evidence of acute GI bleed.  Large amount of stool at the level of the patient's rectum.  09/02/2018 one 2D echo>>1. Left ventricular ejection  fraction, by estimation, is 35 to 40%. The left ventricle has moderately decreased function. The left ventricle  demonstrates regional wall motion abnormalities (see scoring  diagram/findings for description). There is mild  concentric left ventricular hypertrophy. Left ventricular diastolic  function could not be evaluated.  2. Right ventricular systolic function is severely reduced. The right  ventricular size is mildly enlarged. There is moderately elevated  pulmonary artery systolic pressure. The estimated right ventricular  systolic pressure is [id number] mmHg.  3. Left atrial size was severely dilated.  4. Right atrial size was severely dilated.  5. The mitral valve is normal in structure. Moderate mitral valve  regurgitation.  6. Tricuspid valve regurgitation is mild to moderate.  7. The aortic valve is normal in structure. Aortic valve regurgitation is  not visualized. Mild aortic valve sclerosis is present, with no evidence  of aortic valve stenosis.  8. The inferior vena cava is dilated in size with <50% respiratory  variability, suggesting right atrial pressure of 15 mmHg.   Micro Data:  Blood Cx 4/8 > Urine 4/8 >  Antimicrobials:  Flagyl 4/8  Cefepime 4/8 > 4/9 Vancomycin 4/8  Interim history/subjective:  No event overnight. Sedation weaned this AM patient moving purposefully, but not following commands.   Objective   Blood pressure 119/84, pulse 92, temperature 99.3 F (37.4 C), resp. rate (!) 30, height 5\' 6"  (1.676 m), weight 69.8 kg, SpO2 95 %.    Vent Mode: PRVC FiO2 (%):  [40 %] 40 % Set Rate:  [30 bmp] 30 bmp Vt Set:  [350 mL] 350 mL PEEP:  [5 cmH20] 5 cmH20 Plateau Pressure:  [16 cmH20-19 cmH20] 18 cmH20   Intake/Output Summary (Last  24 hours) at 09/04/2019 0908 Last data filed at 09/04/2019 0800 Gross per 24 hour  Intake 2238.18 ml  Output 815 ml  Net 1423.18 ml   Filed Weights   09/02/19 0431 09/03/19 0500 09/04/19 0343  Weight: 62 kg 64.8 kg  69.8 kg    Examination: General: chronically ill appearing female, moving all 4 extremities in bed but not following commands HEENT: ET and O tube in place  Neuro: not following commands, alert  CV: RR, irregularly irregular Rhythm  PULM: clear anteriorly, decreased breath sounds at the bases GI: soft, NTND, no blood noted from OG tube  GU: foley in place with amber urine  Extremities: bilateral pedal edema  Skin: diffuse bruising over bilateral upper and lower extremities    Resolved Hospital Problem list     Assessment & Plan:   # Hemorrhagic shock secondary to GIB from [place] with  - S/p 4 units of pRBCs, 1 of cryo, platelets, and Kcentra - Hgb stable today at 13.4 (14.4 2 days ago) - No blood from OG tube, minimal melena last 2 days  - Continues to require minimal pressor support, on levophed at 5 mcg  - Protonix IV BID  - Vitamin K 10 mg daily - Start MVI, thiamine, and folate  - Cefepime stopped 4/10, fever 4/11, monitor closely (CXR okay WBC downtrending) - Follow INR, 1.9 today  - CBC daily  - GI following, recommend against invasive intervention at this time as she is critically ill and DNR  - Continue pressor support, though overall poor prognosis given multiorgan failure  # Acute hypoxemic respiratory failure # Questionable COPD history - CXR 4/10 showed minimal vascular congestion   - Full ventilatory support, SBT, WUA, VAP - Fentanyl and Precedex gtt for sedation. Versed 1-2 mg q2h PRN  - Trial of extubation today. Good respiratory mechanics, but questionable mental status  # Elevated cardiac enzymes, NSTEMI  - Cardiology follow, not a candidate for intervention at this time. - Troponin 9K >> 15K yesterday, Trend until peak - Newly reduced EF to 35% with RWMA  - No ST elevations on EKG 4/9 - Will start atorvastatin (unable to start further GDMT at this time)  # AKI - Likely ischemic ATN and contrast nephropathy  - Renal function improving, Cr 1.69 >> 2.10  >> 1.8 - No acute indications for HD at this time  - Will continue to monitor renal function and electrolytes - Avoid further nephrotoxins   # Atrial fibrillation  - Will continue to monitor  - Unable to start beta blocker or CCB  - Not a candidate for anticoagulation   Best practice:  Diet: NPO, TFs  Pain/Anxiety/Delirium protocol (if indicated): PAD VAP protocol (if indicated): per protocol DVT prophylaxis: none GI prophylaxis: PPI  BID  Glucose control: SSI Mobility: bed rest  Code Status: DNR Family Communication: family updated at bedside 4/11 Disposition: ICU  Labs   CBC: Recent Labs  Lab 09/01/19 1925 09/01/19 1925 09/01/19 2143 09/01/19 2356 09/02/19 0425 09/02/19 0425 09/02/19 1047 09/02/19 1437 09/02/19 2110 09/03/19 0332 09/04/19 0511  WBC Not Measured   < > 10.7*   < > 17.1*  --   --  15.1* 13.5* 11.7* 10.9*  NEUTROABS Not Measured  --  8.7*  --   --   --   --   --   --  9.0*  --   HGB Not Measured   < > 5.5*   < > 15.2*   < > 15.3*  14.7 14.4 13.5 13.4  HCT Not Measured   < > 20.2*   < > 46.3*   < > 45.0 43.1 41.8 39.2 38.9  MCV Not Measured   < > 105.8*   < > 88.7  --   --  85.7 84.8 84.3 83.8  PLT Not Measured   < > 295   < > 305  --   --  269 252 217 183   < > = values in this interval not displayed.    Basic Metabolic Panel: Recent Labs  Lab 09/01/19 1925 09/01/19 1925 09/02/19 0013 09/02/19 0424 09/02/19 1047 09/03/19 0332 09/04/19 0511  NA 141   < > 139 140 141 141 139  K 4.5   < > 4.3 4.8 4.5 3.4* 3.2*  CL 108  --   --  108  --  111 111  CO2 <7*  --   --  17*  --  18* 18*  GLUCOSE 140*  --   --  110*  --  90 148*  BUN 66*  --   --  61*  --  69* 54*  CREATININE 1.79*  --   --  1.63*  --  2.10* 1.85*  CALCIUM 8.4*  --   --  7.8*  --  7.6* 7.7*  MG  --   --   --  2.0  --  1.8 1.8  PHOS  --   --   --  8.6*  --  5.3* 3.5   < > = values in this interval not displayed.   GFR: Estimated Creatinine Clearance: 20.1 mL/min (A) (by C-G  formula based on SCr of 1.85 mg/dL (H)). Recent Labs  Lab 09/01/19 2356 09/01/19 2357 09/02/19 0425 09/02/19 0425 09/02/19 0545 09/02/19 1437 09/02/19 2110 09/03/19 0332 09/04/19 0511  WBC   < >  --  17.1*   < >  --  15.1* 13.5* 11.7* 10.9*  LATICACIDVEN  --  7.4* 3.6*  --  2.6* 2.0*  --   --   --    < > = values in this interval not displayed.    Liver Function Tests: Recent Labs  Lab 09/01/19 1925 09/02/19 0030 09/04/19 0511  AST 68* 174* 181*  ALT 43 169* 233*  ALKPHOS 48 41 56  BILITOT 0.7 1.8* 1.3*  PROT 5.1* 4.2* 4.6*  ALBUMIN 3.0* 2.5* 2.4*   Recent Labs  Lab 09/02/19 0030  LIPASE 33   No results for input(s): AMMONIA in the last 168 hours.  ABG    Component Value Date/Time   PHART 7.239 (L) 09/02/2019 1047   PCO2ART 35.9 09/02/2019 1047   PO2ART 143.0 (H) 09/02/2019 1047   HCO3 15.4 (L) 09/02/2019 1047   TCO2 16 (L) 09/02/2019 1047   ACIDBASEDEF 11.0 (H) 09/02/2019 1047   O2SAT 99.0 09/02/2019 1047     Coagulation Profile: Recent Labs  Lab 09/01/19 2356 09/02/19 0030 09/02/19 0424 09/03/19 1224 09/04/19 0511  INR 4.4* 4.1* 3.3* 2.9* 1.9*    Cardiac Enzymes: No results for input(s): CKTOTAL, CKMB, CKMBINDEX, TROPONINI in the last 168 hours.  HbA1C: Hgb A1c MFr Bld  Date/Time Value Ref Range Status  06/08/2019 09:08 AM 5.0 4.8 - 5.6 % Final    Comment:    (NOTE) Pre diabetes:          5.7%-6.4% Diabetes:              >6.4% Glycemic control for   <7.0% adults with diabetes  CBG: Recent Labs  Lab 09/03/19 1524 09/03/19 2019 09/04/19 0012 09/04/19 0441 09/04/19 0801  GLUCAP 68* 120* 127* 134* 133*    Pearson Grippe, DO IM PGY-3 Pager: [phone]   PCCM attending:   86 yo admitted for massive GIB, NSTEMI likely from demand.  No issues overnight. Sedation held this AM. Agitation difficult to control but mechanics look good on PS.   BP 119/84   Pulse 92   Temp 99.3 F (37.4 C)   Resp (!) 30   Ht 5\' 6"  (1.676 m)    Wt 69.8 kg   SpO2 95%   BMI 24.84 kg/m   Gen: elderly FM, on mech vent  HENT: NCAT, tracking Lungs: BL vented breaths  Abd: soft nt nd   Labs reviewed: hgb stable  CXR: mild vascular congestion  A:  AHRF on mech vent  Pulmonary edema, vascular congestion  UGIB Acute blood loss anemia  NSTEMI, demand ischemia, from blood loss   P: Good mechanics in PS  Agitation difficult to control  I think we are just going to have to wake her up and pull the tube  She may benefit from bipap s/p extubation  We appreciate cardiology input  We understand not much to do but statin  Not candidate for ac or antiplately due to bleeding   If we can extubate will need PT OT and SLP   This patient is critically ill with multiple organ system failure; which, requires frequent high complexity decision making, assessment, support, evaluation, and titration of therapies. This was completed through the application of advanced monitoring technologies and extensive interpretation of multiple databases. During this encounter critical care time was devoted to patient care services described in this note for 32 minutes.   Garner Nash, DO [facility] 09/04/2019 9:37 AM

## 2019-09-04 NOTE — Procedures (Signed)
Extubation Procedure Note  Patient Details:   Name: SELENE HOLLINGER DOB: [birth year] MRN: [id number]   Airway Documentation:    Vent end date: 09/04/19 Vent end time: 1037   Evaluation  O2 sats: stable throughout Complications: No apparent complications Patient did tolerate procedure well. Bilateral Breath Sounds: Clear   Patient extubated per MD order. Placed on 45% Venti mask due to mouth breathing. Patient unable to cough upon request.   Kathie Dike 09/04/2019, 10:45 AM

## 2019-09-04 NOTE — Progress Notes (Signed)
Progress Note   Subjective  Patient extubated. No blood per rectum, doing better.    Objective   Vital signs in last 24 hours: Temp:  [96.1 F (35.6 C)-101.1 F (38.4 C)] 99.5 F (37.5 C) (04/11 1047) Pulse Rate:  [31-108] 93 (04/11 1047) Resp:  [13-30] 13 (04/11 1047) BP: (76-130)/(51-101) 86/53 (04/11 1047) SpO2:  [94 %-100 %] 100 % (04/11 1047) FiO2 (%):  [40 %-45 %] 45 % (04/11 1047) Weight:  [69.8 kg] 69.8 kg (04/11 0343) Last BM Date: 09/02/19 General:    white female  Heart:  AF with RVR Abdomen:  Soft, nondistended.   Intake/Output from previous day: 04/10 0701 - 04/11 0700 In: 2137.9 [I.V.:2137.9] Out: 815 [Urine:815] Intake/Output this shift: Total I/O In: 620.3 [I.V.:405.4; Other:145; NG/GT:20; IV Piggyback:50] Out: 175 [Urine:175]  Lab Results: Recent Labs    09/02/19 2110 09/03/19 0332 09/04/19 0511  WBC 13.5* 11.7* 10.9*  HGB 14.4 13.5 13.4  HCT 41.8 39.2 38.9  PLT 252 217 183   BMET Recent Labs    09/03/19 0332 09/04/19 0511 09/04/19 1358  NA 141 139 139  K 3.4* 3.2* 4.0  CL 111 111 115*  CO2 18* 18* 17*  GLUCOSE 90 148* 148*  BUN 69* 54* 50*  CREATININE 2.10* 1.85* 1.67*  CALCIUM 7.6* 7.7* 7.4*   LFT Recent Labs    09/02/19 0030 09/02/19 0030 09/04/19 0511  PROT 4.2*   < > 4.6*  ALBUMIN 2.5*   < > 2.4*  AST 174*   < > 181*  ALT 169*   < > 233*  ALKPHOS 41   < > 56  BILITOT 1.8*   < > 1.3*  BILIDIR 0.7*  --   --   IBILI 1.1*  --   --    < > = values in this interval not displayed.   PT/INR Recent Labs    09/03/19 1224 09/04/19 0511  LABPROT 29.9* 21.8*  INR 2.9* 1.9*    Studies/Results: DG Chest Port 1 View  Result Date: 09/03/2019 CLINICAL DATA:  Respiratory failure. EXAM: PORTABLE CHEST 1 VIEW COMPARISON:  09/02/2019 FINDINGS: Enteric tube courses into the region of the stomach as tip is not visualized, although side-port is at the level of the gastroesophageal junction. Left IJ central venous catheter  has tip obliquely oriented over the SVC at the level of the carina. Endotracheal tube has tip 5.3 cm above the carina. Lungs are adequately inflated as patient is slightly rotated to the left. Subtle hazy prominence of the perihilar vessels suggesting minimal vascular congestion. No focal lobar consolidation or effusion. Stable borderline cardiomegaly. IMPRESSION: 1. Stable borderline cardiomegaly with suggestion of minimal vascular congestion. 2.  Tubes and lines as described. Electronically Signed   By: Marin Olp M.D.   On: 09/03/2019 08:41       Assessment / Plan:    86 y/o female admitted with massive UGI bleed a few days ago, was in hemorrhagic shock in the setting of Xarelto - received K-centra, cryo, intubated, pressors, massive transfusion protocol. CT angio on admission without active extravasation. Troponins elevated to 15K, Echo done showing EF 35%, regional wall motion abnormalities noted and severely reduced RV function. She is in AF with RVR and remains on pressors although extubated and doing a bit better today from that perspective.   Hgb stable on IV PPI after reversal of anticoagulation. I would continue IV PPI and supportive care. She does not need urgent endoscopy at this  point and may not pursue endoscopy at all given her cormobidities and pending her course, son at bedside and discussed with him.   We will sign off for now. If she improves over time and endoscopy is desired at some point in the future pending her course, please reconsult.   [place] Cellar, MD [facility]

## 2019-09-04 NOTE — Progress Notes (Signed)
eLink Physician-Brief Progress Note Patient Name: Tiffany Charles DOB: Sep 26, 1929 MRN: [id number]   Date of Service  09/04/2019  HPI/Events of Note  Fever  eICU Interventions  PRN Tylenol ordered.        Kerry Kass Darrel Baroni 09/04/2019, 5:10 AM

## 2019-09-05 DIAGNOSIS — K922 Gastrointestinal hemorrhage, unspecified: Secondary | ICD-10-CM | POA: Diagnosis not present

## 2019-09-05 DIAGNOSIS — R578 Other shock: Secondary | ICD-10-CM | POA: Diagnosis not present

## 2019-09-05 LAB — PROTIME-INR
INR: 1.3 — ABNORMAL HIGH (ref 0.8–1.2)
Prothrombin Time: 16 seconds — ABNORMAL HIGH (ref 11.4–15.2)

## 2019-09-05 LAB — TYPE AND SCREEN
ABO/RH(D): A POS
Antibody Screen: NEGATIVE
Unit division: 0
Unit division: 0
Unit division: 0
Unit division: 0
Unit division: 0
Unit division: 0
Unit division: 0
Unit division: 0
Unit division: 0

## 2019-09-05 LAB — GLUCOSE, CAPILLARY
Glucose-Capillary: 104 mg/dL — ABNORMAL HIGH (ref 70–99)
Glucose-Capillary: 122 mg/dL — ABNORMAL HIGH (ref 70–99)
Glucose-Capillary: 123 mg/dL — ABNORMAL HIGH (ref 70–99)
Glucose-Capillary: 123 mg/dL — ABNORMAL HIGH (ref 70–99)
Glucose-Capillary: 123 mg/dL — ABNORMAL HIGH (ref 70–99)
Glucose-Capillary: 124 mg/dL — ABNORMAL HIGH (ref 70–99)
Glucose-Capillary: 144 mg/dL — ABNORMAL HIGH (ref 70–99)

## 2019-09-05 LAB — BPAM RBC
Blood Product Expiration Date: 202105022359
Blood Product Expiration Date: 202105032359
Blood Product Expiration Date: 202105032359
Blood Product Expiration Date: 202105042359
Blood Product Expiration Date: 202105042359
Blood Product Expiration Date: 202105042359
Blood Product Expiration Date: 202105072359
Blood Product Expiration Date: 202105072359
Blood Product Expiration Date: 202105082359
ISSUE DATE / TIME: 202104081929
ISSUE DATE / TIME: 202104082048
ISSUE DATE / TIME: 202104082048
ISSUE DATE / TIME: 202104082048
ISSUE DATE / TIME: 202104082048
ISSUE DATE / TIME: 202104090127
ISSUE DATE / TIME: 202104090127
Unit Type and Rh: 5100
Unit Type and Rh: 6200
Unit Type and Rh: 6200
Unit Type and Rh: 6200
Unit Type and Rh: 6200
Unit Type and Rh: 6200
Unit Type and Rh: 6200
Unit Type and Rh: 6200
Unit Type and Rh: 6200

## 2019-09-05 LAB — COMPREHENSIVE METABOLIC PANEL
ALT: 186 U/L — ABNORMAL HIGH (ref 0–44)
AST: 121 U/L — ABNORMAL HIGH (ref 15–41)
Albumin: 2.5 g/dL — ABNORMAL LOW (ref 3.5–5.0)
Alkaline Phosphatase: 60 U/L (ref 38–126)
Anion gap: 7 (ref 5–15)
BUN: 43 mg/dL — ABNORMAL HIGH (ref 8–23)
CO2: 19 mmol/L — ABNORMAL LOW (ref 22–32)
Calcium: 7.9 mg/dL — ABNORMAL LOW (ref 8.9–10.3)
Chloride: 114 mmol/L — ABNORMAL HIGH (ref 98–111)
Creatinine, Ser: 1.37 mg/dL — ABNORMAL HIGH (ref 0.44–1.00)
GFR calc Af Amer: 40 mL/min — ABNORMAL LOW (ref >60)
GFR calc non Af Amer: 35 mL/min — ABNORMAL LOW (ref >60)
Glucose, Bld: 136 mg/dL — ABNORMAL HIGH (ref 70–99)
Potassium: 3.8 mmol/L (ref 3.5–5.1)
Sodium: 140 mmol/L (ref 135–145)
Total Bilirubin: 1.5 mg/dL — ABNORMAL HIGH (ref 0.3–1.2)
Total Protein: 5 g/dL — ABNORMAL LOW (ref 6.5–8.1)

## 2019-09-05 LAB — CBC
HCT: 39.6 % (ref 36.0–46.0)
Hemoglobin: 13.2 g/dL (ref 12.0–15.0)
MCH: 29.3 pg (ref 26.0–34.0)
MCHC: 33.3 g/dL (ref 30.0–36.0)
MCV: 88 fL (ref 80.0–100.0)
Platelets: 149 10*3/uL — ABNORMAL LOW (ref 150–400)
RBC: 4.5 MIL/uL (ref 3.87–5.11)
RDW: 17.4 % — ABNORMAL HIGH (ref 11.5–15.5)
WBC: 12.4 10*3/uL — ABNORMAL HIGH (ref 4.0–10.5)
nRBC: 0.9 % — ABNORMAL HIGH (ref 0.0–0.2)

## 2019-09-05 LAB — HEMOGLOBIN A1C
Hgb A1c MFr Bld: 5.6 % (ref 4.8–5.6)
Mean Plasma Glucose: 114 mg/dL

## 2019-09-05 LAB — MAGNESIUM: Magnesium: 2.3 mg/dL (ref 1.7–2.4)

## 2019-09-05 LAB — PHOSPHORUS: Phosphorus: 2.9 mg/dL (ref 2.5–4.6)

## 2019-09-05 MED ORDER — METOPROLOL TARTRATE 5 MG/5ML IV SOLN
5.0000 mg | Freq: Four times a day (QID) | INTRAVENOUS | Status: AC | PRN
  Administered 2019-09-05 – 2019-09-10 (×4): 5 mg via INTRAVENOUS
  Filled 2019-09-05 (×3): qty 5

## 2019-09-05 MED ORDER — METOPROLOL TARTRATE 12.5 MG HALF TABLET: 12.5000 mg | ORAL_TABLET | Freq: Two times a day (BID) | ORAL | Status: DC

## 2019-09-05 NOTE — Plan of Care (Addendum)
Pt LOC transferred to PCU; foley removed; transitioned to room air; PT E&T ordered; SLP evaluated - failed, per Dr. Elsworth Soho wait another day on [place] placement.  Pt with multiple skin tears and abrasions BUE, BLE; cleansed, covered with foam dressings; cleansed & redressed wound on lt foot; applied bilat heel protectors to boggy heels.

## 2019-09-05 NOTE — Evaluation (Signed)
Clinical/Bedside Swallow Evaluation Patient Details  Name: Tiffany Charles MRN: [id number] Date of Birth: 1929/10/16  Today's Date: 09/05/2019 Time: SLP Start Time (ACUTE ONLY): 1411 SLP Stop Time (ACUTE ONLY): 1426 SLP Time Calculation (min) (ACUTE ONLY): 15 min  Past Medical History:  Past Medical History:  Diagnosis Date  . Angina pectoris   . Arthritis   . Chronic anticoagulation    Xarelto  . Chronic diastolic (congestive) heart failure ([place])   . COPD (chronic obstructive pulmonary disease) ([place])   . Coronary artery disease    a. s/p RCA stent 2008. b. cath 02/2009: 30% mLAD, 90% AV groove cx unchanged from prior, 50-60% RCA, EF 60% -> med rx.  . Depression   . Essential hypertension   . Hyperlipidemia   . Mild pulmonary hypertension ([place]) 09/17/2016  . Permanent atrial fibrillation ([place])   . Skin cancer Jan 2013   squamous cell- removed  . Tricuspid regurgitation 09/17/2016   Past Surgical History:  Past Surgical History:  Procedure Laterality Date  .  CATARACTS REMOVED    . ABDOMINAL HYSTERECTOMY    . CARDIAC CATHETERIZATION  03/22/2009   Started medical therapy  . CARDIOVERSION  06/05/2009   3 attempts, last attempt successful  . CORONARY ANGIOPLASTY WITH STENT PLACEMENT  2009   RCA Vision stent  . HIP SURGERY    . LOWER EXTREMITY ANGIOGRAPHY Left 08/10/2019   Procedure: Lower Extremity Angiography;  Surgeon: Katha Cabal, MD;  Location: [place] [facility];  Service: Cardiovascular;  Laterality: Left;  Marland Kitchen MELANOMA EXCISION  06/06/2011   Procedure: MELANOMA EXCISION;  Surgeon: Adin Hector, MD;  Location: [facility];  Service: General;  Laterality: Left;  re excision squamous cell lesion left chest wall    HPI:  86 year old female with PMH as below, which is significant for Atrial fibrillation on Xarelto, COPD, CAD, and HTN. She was recently admitted to [facility] and treated for cellulitis with keflex and doxycycline. She presented to [facility] ED 4/8 hypoxemic and  hypotensive, after being found altered at SNF with coffee-ground emesis beside her bed. Intubated 4/8-4/11.   Assessment / Plan / Recommendation Clinical Impression   Pt upright in bed for session. Pt was unable to follow commands, therefore an oral mech could not be formally completed. She was very confused, resistant, and perseverative. She continued to count "1-5" throughout the session. She put her hands up and said "no" when attempted to provide oral care and offer POs. Pt's mouth was also noted to be dry as she was talking, but oral care could not be completed. As pt was accepting, she was offered x2 ice chips and x2 tsps of thin liquids. Her voice immediately became wet following trials of thin liquids, concerning for aspiration. Given pt's current mentation and s/sx of aspiration, recommend continuing NPO at this time. SLP will continue to follow for PO readiness as clinically appropriate.   SLP Visit Diagnosis: Dysphagia, unspecified (R13.10)    Aspiration Risk  Mild aspiration risk    Diet Recommendation NPO   Medication Administration: Via alternative means    Other  Recommendations Oral Care Recommendations: Oral care QID   Follow up Recommendations Skilled Nursing facility      Frequency and Duration min 2x/week  2 weeks       Prognosis Prognosis for Safe Diet Advancement: Fair Barriers to Reach Goals: Cognitive deficits      Swallow Study   General HPI: 86 year old female with PMH as below,  which is significant for Atrial fibrillation on Xarelto, COPD, CAD, and HTN. She was recently admitted to [facility] and treated for cellulitis with keflex and doxycycline. She presented to [facility] ED 4/8 hypoxemic and hypotensive, after being found altered at SNF with coffee-ground emesis beside her bed. Intubated 4/8-4/11. Type of Study: Bedside Swallow Evaluation Previous Swallow Assessment: 2017 Diet Prior to this Study: NPO Temperature Spikes Noted: No Respiratory Status: Room  air History of Recent Intubation: Yes Length of Intubations (days): 2 days Date extubated: 09/04/19 Behavior/Cognition: Alert;Confused;Requires cueing Oral Cavity Assessment: Dry Oral Care Completed by SLP: No Oral Cavity - Dentition: Poor condition;Missing dentition Vision: Functional for self-feeding Self-Feeding Abilities: Needs assist Patient Positioning: Upright in bed Baseline Vocal Quality: Normal    Oral/Motor/Sensory Function Overall Oral Motor/Sensory Function: Within functional limits   Ice Chips Ice chips: Impaired Presentation: Spoon Pharyngeal Phase Impairments: Multiple swallows   Thin Liquid Thin Liquid: Impaired Presentation: Spoon Pharyngeal  Phase Impairments: Multiple swallows;Wet Vocal Quality    Nectar Thick Nectar Thick Liquid: Not tested   Honey Thick Honey Thick Liquid: Not tested   Puree Puree: Not tested   Solid   Aline August, Student SLP Office: [phone]   Solid: Not tested     09/05/2019,3:05 PM

## 2019-09-05 NOTE — Progress Notes (Signed)
NAME:  Tiffany Charles, MRN:  [id number], DOB:  [birth year], LOS: 4 ADMISSION DATE:  09/01/2019, CONSULTATION DATE:  4/8 REFERRING MD:  Dr. Darl Householder, CHIEF COMPLAINT:  Hemorrhagic shock   Brief History   86 year old female on Xarelto for Atrial fibrillation admitted with hemorrhagic shock secondary to gastrointestinal hemorrhage.   History of present illness   Patient is encephalopathic and/or intubated. Therefore history has been obtained from chart review.  86 year old female with PMH as below, which is significant for Atrial fibrillation on Xarelto, COPD, CAD, and HTN. She was recently admitted to [facility] and treated for cellulitis with keflex and doxycycline. She presented to [facility] ED 4/8 after being found altered at SNF with coffee-ground emesis beside her bed. Upon arrival to the ED she was hypoxemic and hypotensive. Her mental status was poor and she ultimately required intubation. Laboratory evaluation significant for undetectable blood count and INR > 10. She was given [facility] in ED and blood transfusion was initiated. Gastroenterology has seen her in the ED and has deemed her to not presently be a candidate for endoscopy due to acuity of her condition. PCCM asked to admit.   Past Medical History   has a past medical history of Angina pectoris, Arthritis, Chronic anticoagulation, Chronic diastolic (congestive) heart failure ([place]), COPD (chronic obstructive pulmonary disease) ([place]), Coronary artery disease, Depression, Essential hypertension, Hyperlipidemia, Mild pulmonary hypertension ([place]) (09/17/2016), Permanent atrial fibrillation [facility]), Skin cancer (Jan 2013), and Tricuspid regurgitation (09/17/2016).  Significant Hospital Events   4/8 admit  Consults:  GI  Procedures:  ETT 4/8 >  Significant Diagnostic Tests:  CTA abdomen/pelvis 4/8 > no evidence of acute GI bleed.  Large amount of stool at the level of the patient's rectum.  09/02/2018 one 2D echo>>1. Left ventricular ejection  fraction, by estimation, is 35 to 40%. The left ventricle has moderately decreased function. The left ventricle  demonstrates regional wall motion abnormalities (see scoring  diagram/findings for description). There is mild  concentric left ventricular hypertrophy. Left ventricular diastolic  function could not be evaluated.  2. Right ventricular systolic function is severely reduced. The right  ventricular size is mildly enlarged. There is moderately elevated  pulmonary artery systolic pressure. The estimated right ventricular  systolic pressure is 123XX123 mmHg.  3. Left atrial size was severely dilated.  4. Right atrial size was severely dilated.  5. The mitral valve is normal in structure. Moderate mitral valve  regurgitation.  6. Tricuspid valve regurgitation is mild to moderate.  7. The aortic valve is normal in structure. Aortic valve regurgitation is  not visualized. Mild aortic valve sclerosis is present, with no evidence  of aortic valve stenosis.  8. The inferior vena cava is dilated in size with <50% respiratory  variability, suggesting right atrial pressure of 15 mmHg.   Micro Data:  Blood Cx 4/8 > Urine 4/8 >  Antimicrobials:  Flagyl 4/8  Cefepime 4/8 > 4/9 Vancomycin 4/8  Interim history/subjective:  No event overnight. Sedation weaned this AM patient moving purposefully, but not following commands.   Objective   Blood pressure 126/89, pulse (!) 126, temperature 97.7 F (36.5 C), resp. rate (!) 25, height 5\' 6"  (1.676 m), weight 66.4 kg, SpO2 96 %.        Intake/Output Summary (Last 24 hours) at 09/05/2019 1109 Last data filed at 09/05/2019 0900 Gross per 24 hour  Intake 1196.78 ml  Output 735 ml  Net 461.78 ml   Filed Weights   09/03/19 0500  09/04/19 0343 09/05/19 0500  Weight: 64.8 kg 69.8 kg 66.4 kg    Examination: General: chronically ill appearing female HEENT: Pink, moist oral mucosa  Neuro: Alert, verbal, but dificult to understand,  following some commands for nursing staff  CV: Tachycardic, irregularly irregular Rhythm  PULM: clear anteriorly GI: soft, NTND GU: foley in place with amber urine  Extremities: bilateral pedal edema  Skin: diffuse bruising over bilateral upper and lower extremities   Resolved Hospital Problem list     Assessment & Plan:   Hemorrhagic shock secondary to GIB from [place] - S/p 4 units of pRBCs, 1 of cryo, platelets, and Kcentra - Hgb stable for several days at ~13  - No blood from OG tube, minimal residual melena  - Weaned of Levophed yesterday - Protonix IV BID  - Can stop Vitamin K 10 mg daily - MVI, thiamine, and folate  - INR, 1.3 today  - CBC daily  - GI following, recommend against invasive intervention at this time, signed off, re-consult for endoscopy if desired in future, pending her recovery - Stable for transfer to Telemetry, Place Peripheral IVs and remove central line  Acute hypoxemic respiratory failure History of COPD - CXR 4/10 showed minimal vascular congestion   - Successful extubation 4/11  Elevated cardiac enzymes, NSTEMI  - Cardiology signed off - Not a candidate for intervention at this time. - hsTroponin Peak 15K - Newly reduced EF to 35% with RWMA  - No ST elevations on EKG 4/9 - Continue Atorvastatin - Start Metoprolol (PO if tolerating)  AKI - Likely ischemic ATN and contrast nephropathy  - Renal function improving, Cr 1.69 >> 2.10 >> 1.8 >> 1.3 - Continue to monitor renal function and electrolytes - Avoid further nephrotoxins   Atrial fibrillation  - HR consistently in the 110s - Restart BB given improvement in BP  - Not a candidate for anticoagulation   Best practice:  Diet: NPO, SLP  Pain/Anxiety/Delirium protocol (if indicated): PAD VAP protocol (if indicated): per protocol DVT prophylaxis: none GI prophylaxis: PPI  BID  Glucose control: SSI Mobility: bed rest  Code Status: DNR Family Communication: family updated at bedside  4/12 Disposition: ICU  Labs   CBC: Recent Labs  Lab 09/01/19 1925 09/01/19 1925 09/01/19 2143 09/01/19 2356 09/02/19 1437 09/02/19 2110 09/03/19 0332 09/04/19 0511 09/05/19 0422  WBC Not Measured   < > 10.7*   < > 15.1* 13.5* 11.7* 10.9* 12.4*  NEUTROABS Not Measured  --  8.7*  --   --   --  9.0*  --   --   HGB Not Measured   < > 5.5*   < > 14.7 14.4 13.5 13.4 13.2  HCT Not Measured   < > 20.2*   < > 43.1 41.8 39.2 38.9 39.6  MCV Not Measured   < > 105.8*   < > 85.7 84.8 84.3 83.8 88.0  PLT Not Measured   < > 295   < > 269 252 217 183 149*   < > = values in this interval not displayed.    Basic Metabolic Panel: Recent Labs  Lab 09/02/19 0424 09/02/19 0424 09/02/19 1047 09/03/19 0332 09/04/19 0511 09/04/19 1358 09/05/19 0422  NA 140   < > 141 141 139 139 140  K 4.8   < > 4.5 3.4* 3.2* 4.0 3.8  CL 108  --   --  111 111 115* 114*  CO2 17*  --   --  18* 18* 17* 19*  GLUCOSE 110*  --   --  90 148* 148* 136*  BUN 61*  --   --  69* 54* 50* 43*  CREATININE 1.63*  --   --  2.10* 1.85* 1.67* 1.37*  CALCIUM 7.8*  --   --  7.6* 7.7* 7.4* 7.9*  MG 2.0  --   --  1.8 1.8  --  2.3  PHOS 8.6*  --   --  5.3* 3.5  --  2.9   < > = values in this interval not displayed.   GFR: Estimated Creatinine Clearance: 27.1 mL/min (A) (by C-G formula based on SCr of 1.37 mg/dL (H)). Recent Labs  Lab 09/01/19 2356 09/01/19 2357 09/02/19 0425 09/02/19 0425 09/02/19 0545 09/02/19 1437 09/02/19 1437 09/02/19 2110 09/03/19 0332 09/04/19 0511 09/05/19 0422  WBC   < >  --  17.1*   < >  --  15.1*   < > 13.5* 11.7* 10.9* 12.4*  LATICACIDVEN  --  7.4* 3.6*  --  2.6* 2.0*  --   --   --   --   --    < > = values in this interval not displayed.    Liver Function Tests: Recent Labs  Lab 09/01/19 1925 09/02/19 0030 09/04/19 0511 09/05/19 0422  AST 68* 174* 181* 121*  ALT 43 169* 233* 186*  ALKPHOS 48 41 56 60  BILITOT 0.7 1.8* 1.3* 1.5*  PROT 5.1* 4.2* 4.6* 5.0*  ALBUMIN 3.0* 2.5*  2.4* 2.5*   Recent Labs  Lab 09/02/19 0030  LIPASE 33   No results for input(s): AMMONIA in the last 168 hours.  ABG    Component Value Date/Time   PHART 7.239 (L) 09/02/2019 1047   PCO2ART 35.9 09/02/2019 1047   PO2ART 143.0 (H) 09/02/2019 1047   HCO3 15.4 (L) 09/02/2019 1047   TCO2 16 (L) 09/02/2019 1047   ACIDBASEDEF 11.0 (H) 09/02/2019 1047   O2SAT 99.0 09/02/2019 1047     Coagulation Profile: Recent Labs  Lab 09/02/19 0030 09/02/19 0424 09/03/19 1224 09/04/19 0511 09/05/19 0422  INR 4.1* 3.3* 2.9* 1.9* 1.3*    Cardiac Enzymes: No results for input(s): CKTOTAL, CKMB, CKMBINDEX, TROPONINI in the last 168 hours.  HbA1C: Hgb A1c MFr Bld  Date/Time Value Ref Range Status  09/03/2019 05:08 AM 5.6 4.8 - 5.6 % Final    Comment:    (NOTE)         Prediabetes: 5.7 - 6.4         Diabetes: >6.4         Glycemic control for adults with diabetes: <7.0   06/08/2019 09:08 AM 5.0 4.8 - 5.6 % Final    Comment:    (NOTE) Pre diabetes:          5.7%-6.4% Diabetes:              >6.4% Glycemic control for   <7.0% adults with diabetes     CBG: Recent Labs  Lab 09/04/19 1540 09/04/19 2005 09/05/19 0020 09/05/19 0402 09/05/19 0745  GLUCAP 150* 139* 122* [place], DO IM PGY-3 Pager: [phone]

## 2019-09-05 NOTE — Progress Notes (Addendum)
1:20pm: CSW spoke with Narda Rutherford at [facility] who states this patient is from that facility. CSW will follow for discharge planning.  8:50am: CSW spoke with Gregery Na, grandson and legal guardian of the patient at [phone] to discuss this patient's updated primary contact information. [name] confirmed that he is the patient's legal guardian and primary contact. [name] requested all medical updates be given to him.  Madilyn Fireman, MSW, LCSW-A Transitions of Care  Clinical Social Worker  [facility] Emergency Departments  Medical ICU [phone]

## 2019-09-06 ENCOUNTER — Inpatient Hospital Stay (HOSPITAL_COMMUNITY): Payer: Medicare HMO

## 2019-09-06 DIAGNOSIS — R578 Other shock: Secondary | ICD-10-CM | POA: Diagnosis not present

## 2019-09-06 LAB — COMPREHENSIVE METABOLIC PANEL
ALT: 143 U/L — ABNORMAL HIGH (ref 0–44)
AST: 80 U/L — ABNORMAL HIGH (ref 15–41)
Albumin: 2.5 g/dL — ABNORMAL LOW (ref 3.5–5.0)
Alkaline Phosphatase: 64 U/L (ref 38–126)
Anion gap: 10 (ref 5–15)
BUN: 31 mg/dL — ABNORMAL HIGH (ref 8–23)
CO2: 18 mmol/L — ABNORMAL LOW (ref 22–32)
Calcium: 8.1 mg/dL — ABNORMAL LOW (ref 8.9–10.3)
Chloride: 107 mmol/L (ref 98–111)
Creatinine, Ser: 1.16 mg/dL — ABNORMAL HIGH (ref 0.44–1.00)
GFR calc Af Amer: 49 mL/min — ABNORMAL LOW (ref >60)
GFR calc non Af Amer: 42 mL/min — ABNORMAL LOW (ref >60)
Glucose, Bld: 141 mg/dL — ABNORMAL HIGH (ref 70–99)
Potassium: 3.9 mmol/L (ref 3.5–5.1)
Sodium: 135 mmol/L (ref 135–145)
Total Bilirubin: 1.6 mg/dL — ABNORMAL HIGH (ref 0.3–1.2)
Total Protein: 5.6 g/dL — ABNORMAL LOW (ref 6.5–8.1)

## 2019-09-06 LAB — GLUCOSE, CAPILLARY
Glucose-Capillary: 109 mg/dL — ABNORMAL HIGH (ref 70–99)
Glucose-Capillary: 132 mg/dL — ABNORMAL HIGH (ref 70–99)
Glucose-Capillary: 123 mg/dL — ABNORMAL HIGH (ref 70–99)
Glucose-Capillary: 113 mg/dL — ABNORMAL HIGH (ref 70–99)
Glucose-Capillary: 117 mg/dL — ABNORMAL HIGH (ref 70–99)
Glucose-Capillary: 101 mg/dL — ABNORMAL HIGH (ref 70–99)

## 2019-09-06 LAB — CBC
HCT: 43.4 % (ref 36.0–46.0)
Hemoglobin: 14.3 g/dL (ref 12.0–15.0)
MCH: 28.9 pg (ref 26.0–34.0)
MCHC: 32.9 g/dL (ref 30.0–36.0)
MCV: 87.9 fL (ref 80.0–100.0)
Platelets: 146 10*3/uL — ABNORMAL LOW (ref 150–400)
RBC: 4.94 MIL/uL (ref 3.87–5.11)
RDW: 17.2 % — ABNORMAL HIGH (ref 11.5–15.5)
WBC: 14.9 10*3/uL — ABNORMAL HIGH (ref 4.0–10.5)
nRBC: 0.7 % — ABNORMAL HIGH (ref 0.0–0.2)

## 2019-09-06 LAB — MAGNESIUM: Magnesium: 2.1 mg/dL (ref 1.7–2.4)

## 2019-09-06 LAB — PROTIME-INR
INR: 1.1 (ref 0.8–1.2)
Prothrombin Time: 14.5 seconds (ref 11.4–15.2)

## 2019-09-06 LAB — PROCALCITONIN: Procalcitonin: 0.15 ng/mL

## 2019-09-06 LAB — PHOSPHORUS: Phosphorus: 2.3 mg/dL — ABNORMAL LOW (ref 2.5–4.6)

## 2019-09-06 LAB — CULTURE, BLOOD (ROUTINE X 2): Culture: NO GROWTH

## 2019-09-06 MED ORDER — METOPROLOL TARTRATE 25 MG PO TABS
25.0000 mg | ORAL_TABLET | Freq: Two times a day (BID) | ORAL | Status: DC
  Administered 2019-09-06 – 2019-09-09 (×6): 25 mg via ORAL
  Filled 2019-09-06: qty 1
  Filled 2019-09-06: qty 2
  Filled 2019-09-06 (×5): qty 1

## 2019-09-06 MED ORDER — SODIUM CHLORIDE 0.9% FLUSH: 10.0000 mL | INTRAVENOUS | Status: DC | PRN

## 2019-09-06 MED ORDER — SODIUM CHLORIDE 0.9% FLUSH: 10.0000 mL | Freq: Two times a day (BID) | INTRAVENOUS | Status: DC

## 2019-09-06 MED ORDER — SODIUM CHLORIDE 0.9 % IV SOLN
3.0000 g | Freq: Three times a day (TID) | INTRAVENOUS | Status: DC
  Administered 2019-09-06 – 2019-09-09 (×10): 3 g via INTRAVENOUS
  Filled 2019-09-06: qty 3
  Filled 2019-09-06: qty 8
  Filled 2019-09-06 (×3): qty 3
  Filled 2019-09-06: qty 8
  Filled 2019-09-06 (×5): qty 3

## 2019-09-06 MED ORDER — RESOURCE THICKENUP CLEAR PO POWD
ORAL | Status: DC | PRN
  Filled 2019-09-06: qty 125

## 2019-09-06 NOTE — Plan of Care (Signed)
  Problem: Education: Goal: Knowledge of General Education information will improve Description: Including pain rating scale, medication(s)/side effects and non-pharmacologic comfort measures Outcome: Progressing   Problem: Health Behavior/Discharge Planning: Goal: Ability to manage health-related needs will improve Outcome: Progressing   Problem: Clinical Measurements: Goal: Ability to maintain clinical measurements within normal limits will improve Outcome: Progressing Goal: Will remain free from infection Outcome: Progressing Goal: Diagnostic test results will improve Outcome: Progressing Goal: Respiratory complications will improve Outcome: Progressing Goal: Cardiovascular complication will be avoided Outcome: Progressing   Problem: Activity: Goal: Risk for activity intolerance will decrease Outcome: Progressing   Problem: Nutrition: Goal: Adequate nutrition will be maintained Outcome: Progressing   Problem: Coping: Goal: Level of anxiety will decrease Outcome: Progressing   Problem: Elimination: Goal: Will not experience complications related to bowel motility Outcome: Progressing Goal: Will not experience complications related to urinary retention Outcome: Progressing   Problem: Pain Managment: Goal: General experience of comfort will improve Outcome: Progressing   Problem: Safety: Goal: Ability to remain free from injury will improve Outcome: Progressing   Problem: Skin Integrity: Goal: Risk for impaired skin integrity will decrease Outcome: Progressing   Problem: Activity: Goal: Ability to tolerate increased activity will improve Outcome: Progressing   Problem: Respiratory: Goal: Ability to maintain a clear airway and adequate ventilation will improve Outcome: Progressing   Problem: Role Relationship: Goal: Method of communication will improve Outcome: Progressing   Problem: Education: Goal: Knowledge of disease or condition will  improve Outcome: Progressing Goal: Understanding of medication regimen will improve Outcome: Progressing Goal: Individualized Educational Video(s) Outcome: Progressing   Problem: Activity: Goal: Ability to tolerate increased activity will improve Outcome: Progressing   Problem: Cardiac: Goal: Ability to achieve and maintain adequate cardiopulmonary perfusion will improve Outcome: Progressing   Problem: Health Behavior/Discharge Planning: Goal: Ability to safely manage health-related needs after discharge will improve Outcome: Progressing

## 2019-09-06 NOTE — Evaluation (Signed)
Physical Therapy Evaluation Patient Details Name: Tiffany Charles MRN: [id number] DOB: 1929/07/04 Today's Date: 09/06/2019   History of Present Illness  86 year old female with PMH as below, which is significant for Atrial fibrillation on Xarelto, COPD, CAD, and HTN. She was recently admitted to [facility] and treated for cellulitis with keflex and doxycycline. She presented to [facility] ED 4/8 hypoxemic and hypotensive, after being found altered at SNF with coffee-ground emesis beside her bed. Intubated 4/8-4/11.  Clinical Impression  Pt admitted with/for Hypotension and hypoxemia.  Pt presents on eval lethargic and listless with some confusion.  She needed minimal assist for all basic mobility. .  Pt currently limited functionally due to the problems listed. ( See problems list.)   Pt will benefit from PT to maximize function and safety in order to get ready for next venue listed below.     Follow Up Recommendations SNF;Supervision/Assistance - 24 hour;Other (comment)(OR HHPT if son can provide 58* assist)    Equipment Recommendations  Other (comment)(TBA)    Recommendations for Other Services       Precautions / Restrictions Precautions Precautions: Fall      Mobility  Bed Mobility Overal bed mobility: Needs Assistance Bed Mobility: Supine to Sit     Supine to sit: Min assist     General bed mobility comments: helped pt initiate moving LE's off the bed.  minor truncal assist to come up from supine via R elbow.  Transfers Overall transfer level: Needs assistance Equipment used: 1 person hand held assist Transfers: Sit to/from Stand Sit to Stand: Min assist;+2 safety/equipment         General transfer comment: cues to push off the matress, stability assist  Ambulation/Gait Ambulation/Gait assistance: Min assist Gait Distance (Feet): 3 Feet Assistive device: 1 person hand held assist Gait Pattern/deviations: Step-to pattern Gait velocity: slower   General Gait  Details: marched in place then side stepped toward HOB 3 feet with low amplitude steps and stability assis  Stairs            Wheelchair Mobility    Modified Rankin (Stroke Patients Only)       Balance Overall balance assessment: Mild deficits observed, not formally tested   Sitting balance-Leahy Scale: Fair       Standing balance-Leahy Scale: Fair Standing balance comment: maintained static standing on fall risk mat at EOB                             Pertinent Vitals/Pain Pain Assessment: Faces Faces Pain Scale: Hurts a little bit Pain Location: general with LE ROM Pain Descriptors / Indicators: Grimacing;Guarding Pain Intervention(s): Monitored during session    Home Living Family/patient expects to be discharged to:: Skilled nursing facility                 Additional Comments: pt reported she lives with her son, but chart reports came in from SNF.    Prior Function Level of Independence: Needs assistance         Comments: pt unable to tell me her PLOF.     Hand Dominance   Dominant Hand: Right    Extremity/Trunk Assessment   Upper Extremity Assessment Upper Extremity Assessment: Generalized weakness    Lower Extremity Assessment Lower Extremity Assessment: Generalized weakness    Cervical / Trunk Assessment Cervical / Trunk Assessment: Kyphotic  Communication   Communication: No difficulties  Cognition Arousal/Alertness: Awake/alert Behavior During Therapy: [facility]  for tasks assessed/performed Overall Cognitive Status: No family/caregiver present to determine baseline cognitive functioning                                 General Comments: Oriented to person      General Comments General comments (skin integrity, edema, etc.): HR in the upper 110's, o/w VSS    Exercises Other Exercises Other Exercises: warm up ROM exercise of UE and LE's prior to mobility   Assessment/Plan    PT Assessment Patient needs  continued PT services  PT Problem List Decreased strength;Decreased activity tolerance;Decreased balance;Decreased mobility       PT Treatment Interventions DME instruction;Gait training;Stair training;Functional mobility training;Therapeutic activities;Balance training;Patient/family education    PT Goals (Current goals can be found in the Care Plan section)  Acute Rehab PT Goals Patient Stated Goal: pt unable to relate goals PT Goal Formulation: With patient Time For Goal Achievement: 09/20/19 Potential to Achieve Goals: Good    Frequency Min 2X/week   Barriers to discharge        Co-evaluation               AM-PAC PT "6 Clicks" Mobility  Outcome Measure Help needed turning from your back to your side while in a flat bed without using bedrails?: A Little Help needed moving from lying on your back to sitting on the side of a flat bed without using bedrails?: A Little Help needed moving to and from a bed to a chair (including a wheelchair)?: A Little Help needed standing up from a chair using your arms (e.g., wheelchair or bedside chair)?: A Little Help needed to walk in hospital room?: A Little Help needed climbing 3-5 steps with a railing? : A Little 6 Click Score: 18    End of Session   Activity Tolerance: Patient tolerated treatment well;Patient limited by fatigue Patient left: in bed;with call bell/phone within reach;with bed alarm set;with SCD's reapplied Nurse Communication: Mobility status PT Visit Diagnosis: Unsteadiness on feet (R26.81);Muscle weakness (generalized) (M62.81)    Time: [id number] PT Time Calculation (min) (ACUTE ONLY): 27 min   Charges:   PT Evaluation $PT Eval Moderate Complexity: 1 Mod PT Treatments $Therapeutic Activity: 8-22 mins        09/06/2019  Ginger Carne., PT Acute Rehabilitation Services [phone]  (pager) [phone]  (office)  Tessie Fass Estelita Iten 09/06/2019, 5:28 PM

## 2019-09-06 NOTE — Progress Notes (Signed)
Pharmacy Antibiotic Note  Tiffany Charles is a 86 y.o. female admitted on 09/01/2019 with pneumonia. Pharmacy has been consulted for Unasyn dosing.  4/13 CXR shows "Interval worsening bibasilar hazy opacification likely effusions with atelectasis. Infection in the lung bases is possible"  WBC 15>>10.9>>14.9, LA 11>3.6, afeb PCT in process  Plan: Unasyn 3g IV Q8h  F/u clinical progress, c/s, de-escalation, and LOT  Height: 5\' 6"  (167.6 cm) Weight: 66.9 kg (147 lb 7.8 oz) IBW/kg (Calculated) : 59.3  Temp (24hrs), Avg:97.7 F (36.5 C), Min:97.4 F (36.3 C), Max:98.1 F (36.7 C)  Recent Labs  Lab 09/01/19 1928 09/01/19 2143 09/01/19 2357 09/02/19 0424 09/02/19 0425 09/02/19 0425 09/02/19 0545 09/02/19 1437 09/02/19 1437 09/02/19 2110 09/03/19 0332 09/04/19 0511 09/04/19 1358 09/05/19 0422 09/06/19 0531  WBC  --    < >  --   --  17.1*   < >  --  15.1*   < > 13.5* 11.7* 10.9*  --  12.4* 14.9*  CREATININE  --   --   --    < >  --   --   --   --   --   --  2.10* 1.85* 1.67* 1.37* 1.16*  LATICACIDVEN >11.0*  --  7.4*  --  3.6*  --  2.6* 2.0*  --   --   --   --   --   --   --   VANCORANDOM  --   --   --   --   --   --   --   --   --   --  9  --   --   --   --    < > = values in this interval not displayed.    Estimated Creatinine Clearance: 32 mL/min (A) (by C-G formula based on SCr of 1.16 mg/dL (H)).    Allergies  Allergen Reactions  . Codeine Nausea And Vomiting    Antimicrobials this admission: Flagyl x1 Cefepime 4/8 >>4/10 Vancomycin 4/8>>4/10 Unasyn 4/13>>  Dose adjustments this admission: N/A  Microbiology results: 4/8 Bcx: ngtd 4/8: Ucx: 5K Enterococcus faecalis 4/8 MRSA PCR: neg  Thank you for allowing pharmacy to be a part of this patient's care.  Kennon Holter, PharmD PGY1 Ambulatory Care Pharmacy Resident 09/06/2019 1:37 PM

## 2019-09-06 NOTE — Progress Notes (Addendum)
PROGRESS NOTE    Tiffany Charles  ZOX:[id number] DOB: 1930-05-19 DOA: 09/01/2019 PCP: [name], MD   Brief Narrative: 86 year old female with multiple complex comorbidities including permanent A. fib on Xarelto, COPD, CAD, hypertension, chronic diastolic CHF, CAD, hyperlipidemia, mild pulmonary hypertension, skin cancer, TR, recently admitted to [facility] for cellulitis presented to [facility] ED 4/8 after found altered at skilled nursing rehab and coffee-ground emesis at bedside.  In the ED was hypoxic and hypotensive and confused needing intubation.  Blood work showed INR more than 10, undetectable blood count, was given Kcentra in the ED and blood transfusion was initiated.  GI and cardiology was consulted.  Patient admitted to ICU, urine blood culture were sent, empirically placed on IV antibiotics.  CT abdomen pelvis on admission no acute GI bleed, large amount of stool at the level of patient's rectum, echo showed EF 35 to 40%, regional wall motion abnormalities, RV systolic function severely reduced, severely dilated left atria, right atria moderate MR mild to moderate TR. Urine culture with 5000 Enterococcus faecalis-, blood culture no growth to date. Patient was treated for acute respiratory failure, extubated 4/11, upper GI bleed Xarelto reversed with Kcentra needed for unit PRBC EGD deferred due to age/comorbidities, also A. fib with RVR, non-ST elevation MI/ischemic cardiomyopathy no further intervention per cardiology. Patient transferred to [facility] 09/06/2019  Subjective:  Alert,awake, able to tell me her name, voice muffled-difficult to comprehend, thinks she is in apartment., on [place] HR in A fib at 120s. No family at bedside. While on exam keeps making sounds/talking difficult to understand Follows some commands   Assessment & Plan:  Acute respiratory failure, extubated 4/11. DNR. On [place] at 2l.  Continue supportive measures.  Hemorrhagic shock 2/2 bleeding- Upper GI bleed on Xarelto-  reversed with Kcentra, 1 of cryo, platelets, s/p 4 units PRBC, seen by GI, EGD deferred due to age/comorbidities.  HB stable. On Protonix IV twice daily, off daily vitamin K  (inr 1.1), INR is stable.  Can switch to potassium daily. Recent Labs  Lab 09/02/19 2110 09/03/19 0332 09/04/19 0511 09/05/19 0422 09/06/19 0531  HGB 14.4 13.5 13.4 13.2 14.3  HCT 41.8 39.2 38.9 39.6 43.4   A. fib with RVR: Rate poorly controlled.  On prn Lopressor.  Add oral Lopressor as patient passed speech eval this morning.  Non-ST elevation MI/ischemic cardiomyopathy , trop in 15k->2593, echo showed EF 35 to 40% reduced with regional wall motion normalities, seen by cardiology and no further intervention per cardiology, cannot use aspirin  AKI/metabolic acidosis ischemic ATN versus contrast nephropathy renal function improving.  Monitor Recent Labs  Lab 09/03/19 0332 09/04/19 0511 09/04/19 1358 09/05/19 0422 09/06/19 0531  BUN 69* 54* 50* 43* 31*  CREATININE 2.10* 1.85* 1.67* 1.37* 8.11*   Acute Metabolic Encephalopathy she is confused, suspect multifactorial.cont supportive care.  PT OT evaluation.  Reorient and minimize sedatives  Leucocytosis:wbc trending up,no fever,urine cx in ICU was E fecalis but small colonies, and it was catheterized sample. Will recheck UA/Urine culture, check CXR.  Addendum- cxr " Interval worsening bibasilar hazy opacification likely effusions with atelectasis. Infection in the lung bases is possible"  start unasyn empirically for possible pneumonia- will check procal and deescalate abx.  COPD - PFTs in June 2014 with MET test. Cont as needed bronchodilators.  CAD, RCA BMS '09, 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 low risk Myoview Feb 2013. W. NSTEMI this admission.  No further intervention at this time per cardiology,  hX of Occipital  stroke -not a candidate for anticoagulation at this time.  Dysphagia unspecified, speech following Hx of aspiration-On gentle IV  fluids.  On reevaluation by speech this morning placed on dysphagia 1 nectar thick liquid diet  Transaminitis, likely shock liver, LFTs downtrending.  Monitor.  [place]- DNR. Give overall poor condition- consulted palliative care.Condition is guarded to poor, and remains to be seen. Confirmed DNR status with patient's grandson, agrees with palliative care evaluation.  DVT prophylaxis:SCD Code Status: DNR  Family Communication: plan of care discussed.  POA Yolanda Bonine [phone] requested all medical updates to be given to him only (reports he has not heard from Dr. In last 2 days)-updated in detail, discussed about palliative care consult.  Status [id number] Remains inpatient appropriate because:Altered mental status and Inpatient level of care appropriate due to severity of illness  Dispo: The patient is from: TBD              Anticipated d/c is to:               Anticipated d/c date is: 3 days              Patient currently is not medically stable to d/c.  Nutrition: Diet Order            Diet NPO time specified  Diet effective now              Nutrition Problem: Inadequate oral intake Etiology: inability to eat Signs/Symptoms: NPO status Interventions: Refer to RD note for recommendations Body mass index is 23.81 kg/m.  Consultants: PCCM, GI, cardiology Procedures:  CTA abdomen/pelvis 4/8 > no evidence of acute GI bleed.  Large amount of stool at the level of the patient's rectum.  09/02/2018 one 2D echo>>1. Left ventricular ejection fraction, by estimation, is 35 to 40%. The left ventricle has moderately decreased function. The left ventricle  demonstrates regional wall motion abnormalities (see scoring  diagram/findings for description). There is mild  concentric left ventricular hypertrophy. Left ventricular diastolic  function could not be evaluated.  2. Right ventricular systolic function is severely reduced. The right  ventricular size is mildly enlarged.  There is moderately elevated  pulmonary artery systolic pressure. The estimated right ventricular  systolic pressure is 42.8 mmHg.  3. Left atrial size was severely dilated.  4. Right atrial size was severely dilated.  5. The mitral valve is normal in structure. Moderate mitral valve  regurgitation.  6. Tricuspid valve regurgitation is mild to moderate.  7. The aortic valve is normal in structure. Aortic valve regurgitation is  not visualized. Mild aortic valve sclerosis is present, with no evidence  of aortic valve stenosis.  8. The inferior vena cava is dilated in size with <50% respiratory  variability, suggesting right atrial pressure of 15 mmHg.   Microbiology:see note Blood culture urine culture sent 4/8  Medications: Scheduled Meds: . atorvastatin  20 mg Oral q1800  . chlorhexidine  15 mL Mouth Rinse BID  . Chlorhexidine Gluconate Cloth  6 each Topical Daily  . folic acid  1 mg Per Tube Daily  . mouth rinse  15 mL Mouth Rinse q12n4p  . multivitamin  15 mL Per Tube Daily  . pantoprazole (PROTONIX) IV  40 mg Intravenous Q12H   Continuous Infusions: . dextrose 50 mL/hr at 09/06/19 0600    Antimicrobials: Anti-infectives (From admission, onward)   Start     Dose/Rate Route Frequency Ordered Stop   09/02/19 2030  ceFEPIme (MAXIPIME) 2 g in sodium chloride 0.9 %  100 mL IVPB  Status:  Discontinued     2 g 200 mL/hr over 30 Minutes Intravenous Every 24 hours 09/01/19 2046 09/03/19 0900   09/01/19 2043  vancomycin variable dose per unstable renal function (pharmacist dosing)  Status:  Discontinued      Does not apply See admin instructions 09/01/19 2046 09/03/19 0900   09/01/19 1945  ceFEPIme (MAXIPIME) 2 g in sodium chloride 0.9 % 100 mL IVPB     2 g 200 mL/hr over 30 Minutes Intravenous  Once 09/01/19 1930 09/01/19 2051   09/01/19 1945  metroNIDAZOLE (FLAGYL) IVPB 500 mg     500 mg 100 mL/hr over 60 Minutes Intravenous  Once 09/01/19 1930 09/02/19 0129   09/01/19  1945  vancomycin (VANCOCIN) IVPB 1000 mg/200 mL premix     1,000 mg 200 mL/hr over 60 Minutes Intravenous  Once 09/01/19 1930 09/02/19 0129       Objective: Vitals: Today's Vitals   09/06/19 0400 09/06/19 0500 09/06/19 0600 09/06/19 0750  BP: 139/89 (!) 145/82 (!) 138/105   Pulse:  (!) 52 (!) 142   Resp: (!) 21 17 (!) 29   Temp:    97.8 F (36.6 C)  TempSrc:    Oral  SpO2:  95% 100%   Weight:  66.9 kg    Height:      PainSc:        Intake/Output Summary (Last 24 hours) at 09/06/2019 0900 Last data filed at 09/06/2019 0600 Gross per 24 hour  Intake 1046.56 ml  Output 425 ml  Net 621.56 ml   Filed Weights   09/04/19 0343 09/05/19 0500 09/06/19 0500  Weight: 69.8 kg 66.4 kg 66.9 kg   Weight change: 0.5 kg   Intake/Output from previous day: 04/12 0701 - 04/13 0700 In: 1196.2 [I.V.:1196.2] Out: 525 [Urine:525] Intake/Output this shift: No intake/output data recorded.  Examination:  General exam: AAOx2, frail, ill weak appearing. On [place]. HEENT:Oral mucosa moist, Ear/Nose WNL grossly,dentition normal. Respiratory system: bilaterally air + , NO wheezing or crackles,no use of accessory muscle, non tender. Cardiovascular system: S1 & S2 +, regular, No JVD. Gastrointestinal system: Abdomen soft, NT,ND, BS+. Nervous System:Alert, awake,confused, follows only few commands Extremities: mild edema, distal peripheral pulses palpable.  Skin: rashes-reddish spots in thigh-bruises in LE,no icterus. MSK: thin muscle bulk, low tone, power  Data Reviewed: I have personally reviewed following labs and imaging studies CBC: Recent Labs  Lab 09/01/19 1925 09/01/19 1925 09/01/19 2143 09/01/19 2356 09/02/19 2110 09/03/19 0332 09/04/19 0511 09/05/19 0422 09/06/19 0531  WBC Not Measured   < > 10.7*   < > 13.5* 11.7* 10.9* 12.4* 14.9*  NEUTROABS Not Measured  --  8.7*  --   --  9.0*  --   --   --   HGB Not Measured   < > 5.5*   < > 14.4 13.5 13.4 13.2 14.3  HCT Not Measured   < >  20.2*   < > 41.8 39.2 38.9 39.6 43.4  MCV Not Measured   < > 105.8*   < > 84.8 84.3 83.8 88.0 87.9  PLT Not Measured   < > 295   < > 252 217 183 149* 146*   < > = values in this interval not displayed.   Basic Metabolic Panel: Recent Labs  Lab 09/02/19 0424 09/02/19 1047 09/03/19 0332 09/04/19 0511 09/04/19 1358 09/05/19 0422 09/06/19 0531  NA 140   < > 141 139 139 140 135  K 4.8   < >  3.4* 3.2* 4.0 3.8 3.9  CL 108  --  111 111 115* 114* 107  CO2 17*  --  18* 18* 17* 19* 18*  GLUCOSE 110*  --  90 148* 148* 136* 141*  BUN 61*  --  69* 54* 50* 43* 31*  CREATININE 1.63*  --  2.10* 1.85* 1.67* 1.37* 1.16*  CALCIUM 7.8*  --  7.6* 7.7* 7.4* 7.9* 8.1*  MG 2.0  --  1.8 1.8  --  2.3 2.1  PHOS 8.6*  --  5.3* 3.5  --  2.9 2.3*   < > = values in this interval not displayed.   GFR: Estimated Creatinine Clearance: 32 mL/min (A) (by C-G formula based on SCr of 1.16 mg/dL (H)). Liver Function Tests: Recent Labs  Lab 09/01/19 1925 09/02/19 0030 09/04/19 0511 09/05/19 0422 09/06/19 0531  AST 68* 174* 181* 121* 80*  ALT 43 169* 233* 186* 143*  ALKPHOS 48 41 56 60 64  BILITOT 0.7 1.8* 1.3* 1.5* 1.6*  PROT 5.1* 4.2* 4.6* 5.0* 5.6*  ALBUMIN 3.0* 2.5* 2.4* 2.5* 2.5*   Recent Labs  Lab 09/02/19 0030  LIPASE 33   No results for input(s): AMMONIA in the last 168 hours. Coagulation Profile: Recent Labs  Lab 09/02/19 0424 09/03/19 1224 09/04/19 0511 09/05/19 0422 09/06/19 0531  INR 3.3* 2.9* 1.9* 1.3* 1.1   Cardiac Enzymes: No results for input(s): CKTOTAL, CKMB, CKMBINDEX, TROPONINI in the last 168 hours. BNP (last 3 results) No results for input(s): PROBNP in the last 8760 hours. HbA1C: No results for input(s): HGBA1C in the last 72 hours. CBG: Recent Labs  Lab 09/05/19 1509 09/05/19 1933 09/05/19 2325 09/06/19 0316 09/06/19 0748  GLUCAP 144* 123* 123* 109* 132*   Lipid Profile: No results for input(s): CHOL, HDL, LDLCALC, TRIG, CHOLHDL, LDLDIRECT in the last 72  hours. Thyroid Function Tests: No results for input(s): TSH, T4TOTAL, FREET4, T3FREE, THYROIDAB in the last 72 hours. Anemia Panel: No results for input(s): VITAMINB12, FOLATE, FERRITIN, TIBC, IRON, RETICCTPCT in the last 72 hours. Sepsis Labs: Recent Labs  Lab 09/01/19 2357 09/02/19 0425 09/02/19 0545 09/02/19 1437  LATICACIDVEN 7.4* 3.6* 2.6* 2.0*    Recent Results (from the past 240 hour(s))  Blood culture (routine x 2)     Status: None   Collection Time: 09/01/19  7:25 PM   Specimen: BLOOD  Result Value Ref Range Status   Specimen Description BLOOD LEFT UPPER  Final   Special Requests   Final    BOTTLES DRAWN AEROBIC AND ANAEROBIC Blood Culture results may not be optimal due to an excessive volume of blood received in culture bottles   Culture   Final    NO GROWTH 5 DAYS Performed at [facility], [place] [address] [place], [place] [address]    Report Status 09/06/2019 FINAL  Final  Urine culture     Status: Abnormal   Collection Time: 09/01/19  7:30 PM   Specimen: In/Out Cath Urine  Result Value Ref Range Status   Specimen Description IN/OUT CATH URINE  Final   Special Requests   Final    NONE Performed at [facility], [place] [address], [place], [place] [address]    Culture 5,000 COLONIES/mL ENTEROCOCCUS FAECALIS (A)  Final   Report Status 09/04/2019 FINAL  Final   Organism ID, Bacteria ENTEROCOCCUS FAECALIS (A)  Final      Susceptibility   Enterococcus faecalis - MIC*    AMPICILLIN <=2 SENSITIVE Sensitive  NITROFURANTOIN <=16 SENSITIVE Sensitive     VANCOMYCIN 1 SENSITIVE Sensitive     * 5,000 COLONIES/mL ENTEROCOCCUS FAECALIS  Respiratory Panel by RT PCR (Flu A&B, Covid) - Nasopharyngeal Swab     Status: None   Collection Time: 09/01/19  8:12 PM   Specimen: Nasopharyngeal Swab  Result Value Ref Range Status   SARS Coronavirus 2 by RT PCR NEGATIVE NEGATIVE Final    Comment: (NOTE) SARS-CoV-2 target nucleic acids are NOT DETECTED. The SARS-CoV-2  RNA is generally detectable in upper respiratoy specimens during the acute phase of infection. The lowest concentration of SARS-CoV-2 viral copies this assay can detect is 131 copies/mL. A negative result does not preclude SARS-Cov-2 infection and should not be used as the sole basis for treatment or other patient management decisions. A negative result may occur with  improper specimen collection/handling, submission of specimen other than nasopharyngeal swab, presence of viral mutation(s) within the areas targeted by this assay, and inadequate number of viral copies (<131 copies/mL). A negative result must be combined with clinical observations, patient history, and epidemiological information. The expected result is Negative. Fact Sheet for Patients:  PinkCheek.be Fact Sheet for Healthcare Providers:  GravelBags.it This test is not yet ap proved or cleared by the [place] FDA and  has been authorized for detection and/or diagnosis of SARS-CoV-2 by FDA under an Emergency Use Authorization (EUA). This EUA will remain  in effect (meaning this test can be used) for the duration of the COVID-19 declaration under Section 564(b)(1) of the Act, 21 U.S.C. section 360bbb-3(b)(1), unless the authorization is terminated or revoked sooner.    Influenza A by PCR NEGATIVE NEGATIVE Final   Influenza B by PCR NEGATIVE NEGATIVE Final    Comment: (NOTE) The Xpert Xpress SARS-CoV-2/FLU/RSV assay is intended as an aid in  the diagnosis of influenza from Nasopharyngeal swab specimens and  should not be used as a sole basis for treatment. Nasal washings and  aspirates are unacceptable for Xpert Xpress SARS-CoV-2/FLU/RSV  testing. Fact Sheet for Patients: PinkCheek.be Fact Sheet for Healthcare Providers: GravelBags.it This test is not yet approved or cleared by the [place] FDA  and  has been authorized for detection and/or diagnosis of SARS-CoV-2 by  FDA under an Emergency Use Authorization (EUA). This EUA will remain  in effect (meaning this test can be used) for the duration of the  Covid-19 declaration under Section 564(b)(1) of the Act, 21  U.S.C. section 360bbb-3(b)(1), unless the authorization is  terminated or revoked. Performed at [facility], [place] [address], [place], [place] [address]   MRSA PCR Screening     Status: None   Collection Time: 09/01/19 11:30 PM   Specimen: Nasal Mucosa; Nasopharyngeal  Result Value Ref Range Status   MRSA by PCR NEGATIVE NEGATIVE Final    Comment:        The GeneXpert MRSA Assay (FDA approved for NASAL specimens only), is one component of a comprehensive MRSA colonization surveillance program. It is not intended to diagnose MRSA infection nor to guide or monitor treatment for MRSA infections. Performed at [facility], [place] [address] [place], [place] [address]   Blood culture (routine x 2)     Status: None (Preliminary result)   Collection Time: 09/01/19 11:57 PM   Specimen: BLOOD  Result Value Ref Range Status   Specimen Description BLOOD CENTRAL LINE  Final   Special Requests   Final    BOTTLES DRAWN AEROBIC ONLY Blood Culture adequate volume  Culture   Final    NO GROWTH 4 DAYS Performed at [facility], [place] [address], [place], [place] [address]    Report Status PENDING  Incomplete      Radiology Studies: No results found.   LOS: 5 days   Time spent: More than 50% of that time was spent in counseling and/or coordination of care.  Antonieta Pert, MD [facility]  09/06/2019, 9:00 AM

## 2019-09-06 NOTE — Progress Notes (Signed)
  Speech Language Pathology Treatment: Dysphagia  Patient Details Name: Tiffany Charles MRN: [id number] DOB: 03-30-1930 Today's Date: 09/06/2019 Time: 1000-1010 SLP Time Calculation (min) (ACUTE ONLY): 10 min  Assessment / Plan / Recommendation Clinical Impression  Pt alert and initially accepting of PO. RN just cleared mouth of thick dry secretions on upper palate, looks pretty clear now. Pt took sips from a straw without difficulty, had a single immediate swallow per each sip, no signs of aspiration. About 5 sips of water and two bites of applesauce given before pt refused. No coughing or wet vocal quality to indicate aspiration. Phonation only minimally hoarse, though volume is low. Risk of aspiration still high, but given favorable performance today and probability that pt would not participate well with MBS, recommend pt  initiate a conservative diet of pureed foods and sips of nectar thick liquids or plain water with total assist. Will f/u for tolerance.    HPI HPI: 86 year old female with PMH as below, which is significant for Atrial fibrillation on Xarelto, COPD, CAD, and HTN. She was recently admitted to [facility] and treated for cellulitis with keflex and doxycycline. She presented to [facility] ED 4/8 hypoxemic and hypotensive, after being found altered at SNF with coffee-ground emesis beside her bed. Intubated 4/8-4/11.      SLP Plan  Continue with current plan of care       Recommendations  Diet recommendations: Dysphagia 1 (puree);Nectar-thick liquid Liquids provided via: Straw Medication Administration: Crushed with puree Supervision: Staff to assist with self feeding Compensations: Slow rate;Small sips/bites                Oral Care Recommendations: Oral care BID Follow up Recommendations: Skilled Nursing facility SLP Visit Diagnosis: Dysphagia, unspecified (R13.10) Plan: Continue with current plan of care       GO                Saatvik Thielman, Katherene Ponto 09/06/2019, 10:14 AM

## 2019-09-07 DIAGNOSIS — R578 Other shock: Secondary | ICD-10-CM | POA: Diagnosis not present

## 2019-09-07 DIAGNOSIS — R531 Weakness: Secondary | ICD-10-CM

## 2019-09-07 DIAGNOSIS — I639 Cerebral infarction, unspecified: Secondary | ICD-10-CM

## 2019-09-07 DIAGNOSIS — G9341 Metabolic encephalopathy: Secondary | ICD-10-CM

## 2019-09-07 DIAGNOSIS — I4821 Permanent atrial fibrillation: Secondary | ICD-10-CM | POA: Diagnosis not present

## 2019-09-07 DIAGNOSIS — Z515 Encounter for palliative care: Secondary | ICD-10-CM

## 2019-09-07 DIAGNOSIS — I739 Peripheral vascular disease, unspecified: Secondary | ICD-10-CM

## 2019-09-07 DIAGNOSIS — N179 Acute kidney failure, unspecified: Secondary | ICD-10-CM

## 2019-09-07 DIAGNOSIS — K922 Gastrointestinal hemorrhage, unspecified: Secondary | ICD-10-CM | POA: Diagnosis not present

## 2019-09-07 DIAGNOSIS — J9601 Acute respiratory failure with hypoxia: Secondary | ICD-10-CM | POA: Diagnosis not present

## 2019-09-07 DIAGNOSIS — Z7189 Other specified counseling: Secondary | ICD-10-CM

## 2019-09-07 DIAGNOSIS — I5032 Chronic diastolic (congestive) heart failure: Secondary | ICD-10-CM

## 2019-09-07 LAB — CBC
HCT: 43.6 % (ref 36.0–46.0)
Hemoglobin: 13.9 g/dL (ref 12.0–15.0)
MCH: 28.1 pg (ref 26.0–34.0)
MCHC: 31.9 g/dL (ref 30.0–36.0)
MCV: 88.1 fL (ref 80.0–100.0)
Platelets: 148 10*3/uL — ABNORMAL LOW (ref 150–400)
RBC: 4.95 MIL/uL (ref 3.87–5.11)
RDW: 17.1 % — ABNORMAL HIGH (ref 11.5–15.5)
WBC: 13.7 10*3/uL — ABNORMAL HIGH (ref 4.0–10.5)
nRBC: 0.3 % — ABNORMAL HIGH (ref 0.0–0.2)

## 2019-09-07 LAB — COMPREHENSIVE METABOLIC PANEL
ALT: 95 U/L — ABNORMAL HIGH (ref 0–44)
AST: 56 U/L — ABNORMAL HIGH (ref 15–41)
Albumin: 2.3 g/dL — ABNORMAL LOW (ref 3.5–5.0)
Alkaline Phosphatase: 69 U/L (ref 38–126)
Anion gap: 10 (ref 5–15)
BUN: 20 mg/dL (ref 8–23)
CO2: 17 mmol/L — ABNORMAL LOW (ref 22–32)
Calcium: 8.1 mg/dL — ABNORMAL LOW (ref 8.9–10.3)
Chloride: 110 mmol/L (ref 98–111)
Creatinine, Ser: 0.89 mg/dL (ref 0.44–1.00)
GFR calc Af Amer: >60 mL/min (ref >60)
GFR calc non Af Amer: 58 mL/min — ABNORMAL LOW (ref >60)
Glucose, Bld: 112 mg/dL — ABNORMAL HIGH (ref 70–99)
Potassium: 3.7 mmol/L (ref 3.5–5.1)
Sodium: 137 mmol/L (ref 135–145)
Total Bilirubin: 1.3 mg/dL — ABNORMAL HIGH (ref 0.3–1.2)
Total Protein: 5.1 g/dL — ABNORMAL LOW (ref 6.5–8.1)

## 2019-09-07 LAB — GLUCOSE, CAPILLARY
Glucose-Capillary: 108 mg/dL — ABNORMAL HIGH (ref 70–99)
Glucose-Capillary: 107 mg/dL — ABNORMAL HIGH (ref 70–99)
Glucose-Capillary: 115 mg/dL — ABNORMAL HIGH (ref 70–99)
Glucose-Capillary: 88 mg/dL (ref 70–99)
Glucose-Capillary: 99 mg/dL (ref 70–99)

## 2019-09-07 LAB — PROCALCITONIN: Procalcitonin: 0.13 ng/mL

## 2019-09-07 LAB — PHOSPHORUS: Phosphorus: 2 mg/dL — ABNORMAL LOW (ref 2.5–4.6)

## 2019-09-07 LAB — CULTURE, BLOOD (ROUTINE X 2)
Culture: NO GROWTH
Special Requests: ADEQUATE

## 2019-09-07 MED ORDER — ACETAMINOPHEN 325 MG PO TABS: 650.0000 mg | ORAL_TABLET | ORAL | Status: DC | PRN

## 2019-09-07 MED ORDER — FOLIC ACID 1 MG PO TABS
1.0000 mg | ORAL_TABLET | Freq: Every day | ORAL | Status: DC
  Administered 2019-09-07 – 2019-09-27 (×21): 1 mg via ORAL
  Filled 2019-09-07 (×21): qty 1

## 2019-09-07 MED ORDER — POLYETHYLENE GLYCOL 3350 17 G PO PACK
17.0000 g | PACK | Freq: Every day | ORAL | Status: DC | PRN
  Administered 2019-09-09: 16:00:00 17 g via ORAL
  Filled 2019-09-07: qty 1

## 2019-09-07 MED ORDER — ENSURE ENLIVE PO LIQD
237.0000 mL | Freq: Two times a day (BID) | ORAL | Status: DC
  Administered 2019-09-08 – 2019-09-27 (×34): 237 mL via ORAL

## 2019-09-07 MED ORDER — ADULT MULTIVITAMIN W/MINERALS CH
1.0000 | ORAL_TABLET | Freq: Every day | ORAL | Status: DC
  Administered 2019-09-08 – 2019-09-27 (×20): 1 via ORAL
  Filled 2019-09-07 (×21): qty 1

## 2019-09-07 NOTE — Progress Notes (Signed)
PROGRESS NOTE  Tiffany Charles OEV:[id number] DOB: 1929-10-14 DOA: 09/01/2019 PCP: [name], MD  Brief Narrative: 86 year old female with multiple complex comorbidities including permanent A. fib on Xarelto, COPD, CAD, hypertension, chronic diastolic CHF, CAD, hyperlipidemia, mild pulmonary hypertension, skin cancer, TR, recently admitted to [facility] for cellulitis presented to [facility] ED 4/8 after found altered at skilled nursing rehab and coffee-ground emesis at bedside.  In the ED was hypoxic and hypotensive and confused needing intubation.  Blood work showed INR more than 10, undetectable blood count, was given Kcentra in the ED and blood transfusion was initiated.  GI and cardiology was consulted.  Patient admitted to ICU, urine blood culture were sent, empirically placed on IV antibiotics.  CT abdomen pelvis on admission no acute GI bleed, large amount of stool at the level of patient's rectum, echo showed EF 35 to 40%, regional wall motion abnormalities, RV systolic function severely reduced, severely dilated left atria, right atria moderate MR mild to moderate TR. Urine culture with 5000 Enterococcus faecalis-, blood culture no growth to date. Patient was treated for acute respiratory failure, extubated 4/11, upper GI bleed Xarelto reversed with Kcentra needed for unit PRBC EGD deferred due to age/comorbidities, also A. fib with RVR, non-ST elevation MI/ischemic cardiomyopathy no further intervention per cardiology. Patient transferred to [facility] 09/06/2019    HPI/Recap of past 24 hours:  She is only oriented to person, not a reliable historian She denies pain, does not appear in acute distress Cr/lft  Improved Wbc elevated but trending down On 2liter oxygen  Bilateral lower extremity edema. + 10liters per I/o's, will d/c vif Son at bedside  Assessment/Plan: Principal Problem:   Hemorrhagic shock [facility]) Active Problems:   Permanent atrial fibrillation   COPD - PFTs in June 2014 with MET  test   CAD, RCA BMS '09, 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 low risk Myoview Feb 2013   Chronic anticoagulation- Xarelto   Chronic diastolic (congestive) heart failure (HCC)   Mild pulmonary hypertension (HCC)   Occipital stroke (HCC)   Acute liver failure   Acute hypoxemic respiratory failure (HCC)   Upper GI bleed   Hemorrhagic shock 2/2 bleeding- Upper GI bleed - home meds Xarelto discontinued -reversed with Kcentra, 1 of cryo, platelets, s/p 4 units PRBC, seen by GI, EGD deferred due to age/comorbidities.  HB stable. On Protonix IV twice daily, off daily vitamin K  (inr 1.1), INR is stable  Acute hypoxemic respiratory failure History of COPD - CXR 4/10 showed minimal vascular congestion   - Successful extubation 4/11   Afib/RVR Not a candidate for anticoagulation On lopressor, uptitrate as bp allows  Non-ST elevation MI/ischemic cardiomyopathy  H/o CAD s/p BMS in 2009 trop in 15k->2593, echo showed EF 35 to 40% reduced with regional wall motion normalities, seen by cardiology and no further intervention per cardiology, cannot use aspirin On statin D/v ivf  AKI/metabolic acidosis ischemic ATN versus contrast nephropathy renal function improving.  Monitor  Transaminitis, likely shock liver, LFTs downtrending.  Monitor.  Dysphagia On pureed diet/honey thick liquid Currently on unasyn for possible aspiration pneumonia  Acute Metabolic Encephalopathy she is confused, suspect multifactorial.cont supportive care.  PT OT evaluation.  Reorient and minimize sedatives H/o cva in 05/2019, suspect  Dementia  PVD/Chronic Left foot wound S/p Percutaneous transluminal angioplasty and stent placement left superficial femoral artery and popliteal on 8/29 Daily application of small foam dressing for foot wound.  vagina MASD  Antifungal powder q 4 hours to vaginal  folds  Poor prognosis, palliative care consulted, input appreciated   DVT Prophylaxis:scd  Code Status:  DNR  Family Communication: patient , grandson (who is the POA) only per request  Disposition Plan:    Patient came from:                snf                                                                                          Anticipated d/c place: snf   Barriers to d/c OR conditions which need to be met to effect a safe d/c: pending palliative care consult   Consultants:  Cardiology   Gi  Wound care  Critical care  Procedures:  Intubation/extubation  Antibiotics: Flagyl 4/8  Cefepime 4/8 > 4/9 Vancomycin 4/8   Objective: BP (!) 125/93 (BP Location: Right Arm)   Pulse 95   Temp (!) 97.4 F (36.3 C) (Oral)   Resp 16   Ht 5' 6"  (1.676 m)   Wt 66.9 kg   SpO2 100%   BMI 23.81 kg/m   Intake/Output Summary (Last 24 hours) at 09/07/2019 1012 Last data filed at 09/07/2019 0836 Gross per 24 hour  Intake 1393.47 ml  Output 385 ml  Net 1008.47 ml   Filed Weights   09/05/19 0500 09/06/19 0500 09/07/19 0033  Weight: 66.4 kg 66.9 kg 66.9 kg    Exam: Patient is examined daily including today on 09/07/2019, exams remain the same as of yesterday except that has changed    General:  Chronically ill, pleasantly confused  Cardiovascular: IRRR  Respiratory: CTABL  Abdomen: Soft/ND/NT, positive BS  Musculoskeletal: bilateral lower extremity pitting Edema  Neuro: alert, oriented to person  Data Reviewed: Basic Metabolic Panel: Recent Labs  Lab 09/02/19 0424 09/02/19 1047 09/03/19 0332 09/03/19 0332 09/04/19 0511 09/04/19 1358 09/05/19 0422 09/06/19 0531 09/07/19 0455  NA 140   < > 141   < > 139 139 140 135 137  K 4.8   < > 3.4*   < > 3.2* 4.0 3.8 3.9 3.7  CL 108  --  111   < > 111 115* 114* 107 110  CO2 17*  --  18*   < > 18* 17* 19* 18* 17*  GLUCOSE 110*  --  90   < > 148* 148* 136* 141* 112*  BUN 61*  --  69*   < > 54* 50* 43* 31* 20  CREATININE 1.63*  --  2.10*   < > 1.85* 1.67* 1.37* 1.16* 0.89  CALCIUM 7.8*  --  7.6*   < > 7.7* 7.4* 7.9*  8.1* 8.1*  MG 2.0  --  1.8  --  1.8  --  2.3 2.1  --   PHOS 8.6*  --  5.3*  --  3.5  --  2.9 2.3* 2.0*   < > = values in this interval not displayed.   Liver Function Tests: Recent Labs  Lab 09/02/19 0030 09/04/19 0511 09/05/19 0422 09/06/19 0531 09/07/19 0455  AST 174* 181* 121* 80* 56*  ALT 169* 233* 186* 143* 95*  ALKPHOS 41 56 60 64  69  BILITOT 1.8* 1.3* 1.5* 1.6* 1.3*  PROT 4.2* 4.6* 5.0* 5.6* 5.1*  ALBUMIN 2.5* 2.4* 2.5* 2.5* 2.3*   Recent Labs  Lab 09/02/19 0030  LIPASE 33   No results for input(s): AMMONIA in the last 168 hours. CBC: Recent Labs  Lab 09/01/19 1925 09/01/19 1925 09/01/19 2143 09/01/19 2356 09/03/19 0332 09/04/19 0511 09/05/19 0422 09/06/19 0531 09/07/19 0455  WBC Not Measured   < > 10.7*   < > 11.7* 10.9* 12.4* 14.9* 13.7*  NEUTROABS Not Measured  --  8.7*  --  9.0*  --   --   --   --   HGB Not Measured   < > 5.5*   < > 13.5 13.4 13.2 14.3 13.9  HCT Not Measured   < > 20.2*   < > 39.2 38.9 39.6 43.4 43.6  MCV Not Measured   < > 105.8*   < > 84.3 83.8 88.0 87.9 88.1  PLT Not Measured   < > 295   < > 217 183 149* 146* 148*   < > = values in this interval not displayed.   Cardiac Enzymes:   No results for input(s): CKTOTAL, CKMB, CKMBINDEX, TROPONINI in the last 168 hours. BNP (last 3 results) Recent Labs    06/05/19 1352  BNP 194.0*    ProBNP (last 3 results) No results for input(s): PROBNP in the last 8760 hours.  CBG: Recent Labs  Lab 09/06/19 1654 09/06/19 2037 09/06/19 2205 09/07/19 0510 09/07/19 0748  GLUCAP 113* 117* 101* 108* 107*    Recent Results (from the past 240 hour(s))  Blood culture (routine x 2)     Status: None   Collection Time: 09/01/19  7:25 PM   Specimen: BLOOD  Result Value Ref Range Status   Specimen Description BLOOD LEFT UPPER  Final   Special Requests   Final    BOTTLES DRAWN AEROBIC AND ANAEROBIC Blood Culture results may not be optimal due to an excessive volume of blood received in culture  bottles   Culture   Final    NO GROWTH 5 DAYS Performed at [facility], [place] [address], [place], [place] [address]    Report Status 09/06/2019 FINAL  Final  Urine culture     Status: Abnormal   Collection Time: 09/01/19  7:30 PM   Specimen: In/Out Cath Urine  Result Value Ref Range Status   Specimen Description IN/OUT CATH URINE  Final   Special Requests   Final    NONE Performed at [facility], [place] [address] [place], [place] [address]    Culture 5,000 COLONIES/mL ENTEROCOCCUS FAECALIS (A)  Final   Report Status 09/04/2019 FINAL  Final   Organism ID, Bacteria ENTEROCOCCUS FAECALIS (A)  Final      Susceptibility   Enterococcus faecalis - MIC*    AMPICILLIN <=2 SENSITIVE Sensitive     NITROFURANTOIN <=16 SENSITIVE Sensitive     VANCOMYCIN 1 SENSITIVE Sensitive     * 5,000 COLONIES/mL ENTEROCOCCUS FAECALIS  Respiratory Panel by RT PCR (Flu A&B, Covid) - Nasopharyngeal Swab     Status: None   Collection Time: 09/01/19  8:12 PM   Specimen: Nasopharyngeal Swab  Result Value Ref Range Status   SARS Coronavirus 2 by RT PCR NEGATIVE NEGATIVE Final    Comment: (NOTE) SARS-CoV-2 target nucleic acids are NOT DETECTED. The SARS-CoV-2 RNA is generally detectable in upper respiratoy specimens during the acute phase of infection. The lowest concentration of SARS-CoV-2  viral copies this assay can detect is 131 copies/mL. A negative result does not preclude SARS-Cov-2 infection and should not be used as the sole basis for treatment or other patient management decisions. A negative result may occur with  improper specimen collection/handling, submission of specimen other than nasopharyngeal swab, presence of viral mutation(s) within the areas targeted by this assay, and inadequate number of viral copies (<131 copies/mL). A negative result must be combined with clinical observations, patient history, and epidemiological information. The expected result is Negative. Fact Sheet  for Patients:  PinkCheek.be Fact Sheet for Healthcare Providers:  GravelBags.it This test is not yet ap proved or cleared by the [place] FDA and  has been authorized for detection and/or diagnosis of SARS-CoV-2 by FDA under an Emergency Use Authorization (EUA). This EUA will remain  in effect (meaning this test can be used) for the duration of the COVID-19 declaration under Section 564(b)(1) of the Act, 21 U.S.C. section 360bbb-3(b)(1), unless the authorization is terminated or revoked sooner.    Influenza A by PCR NEGATIVE NEGATIVE Final   Influenza B by PCR NEGATIVE NEGATIVE Final    Comment: (NOTE) The Xpert Xpress SARS-CoV-2/FLU/RSV assay is intended as an aid in  the diagnosis of influenza from Nasopharyngeal swab specimens and  should not be used as a sole basis for treatment. Nasal washings and  aspirates are unacceptable for Xpert Xpress SARS-CoV-2/FLU/RSV  testing. Fact Sheet for Patients: PinkCheek.be Fact Sheet for Healthcare Providers: GravelBags.it This test is not yet approved or cleared by the [place] FDA and  has been authorized for detection and/or diagnosis of SARS-CoV-2 by  FDA under an Emergency Use Authorization (EUA). This EUA will remain  in effect (meaning this test can be used) for the duration of the  Covid-19 declaration under Section 564(b)(1) of the Act, 21  U.S.C. section 360bbb-3(b)(1), unless the authorization is  terminated or revoked. Performed at [facility], [place] [address], [place], [place] [address]   MRSA PCR Screening     Status: None   Collection Time: 09/01/19 11:30 PM   Specimen: Nasal Mucosa; Nasopharyngeal  Result Value Ref Range Status   MRSA by PCR NEGATIVE NEGATIVE Final    Comment:        The GeneXpert MRSA Assay (FDA approved for NASAL specimens only), is one component of a comprehensive MRSA  colonization surveillance program. It is not intended to diagnose MRSA infection nor to guide or monitor treatment for MRSA infections. Performed at [facility], [place] [address] [place], [place] [address]   Blood culture (routine x 2)     Status: None (Preliminary result)   Collection Time: 09/01/19 11:57 PM   Specimen: BLOOD  Result Value Ref Range Status   Specimen Description BLOOD CENTRAL LINE  Final   Special Requests   Final    BOTTLES DRAWN AEROBIC ONLY Blood Culture adequate volume   Culture   Final    NO GROWTH 4 DAYS Performed at [facility], [place] [address], [place], [place] [address]    Report Status PENDING  Incomplete     Studies: DG CHEST PORT 1 VIEW  Result Date: 09/06/2019 CLINICAL DATA:  Shortness of breath. EXAM: PORTABLE CHEST 1 VIEW COMPARISON:  09/03/2019 FINDINGS: Lungs are adequately inflated demonstrate interval worsening hazy bibasilar opacification likely effusions with atelectasis. Subtle hazy prominence of the perihilar markings likely mild degree of vascular congestion. Stable cardiomegaly. Remainder of the exam is unchanged. IMPRESSION: 1. Interval worsening  bibasilar hazy opacification likely effusions with atelectasis. Infection in the lung bases is possible. 2.  Stable cardiomegaly with suggestion of mild vascular congestion. Electronically Signed   By: Marin Olp M.D.   On: 09/06/2019 12:55    Scheduled Meds: . atorvastatin  20 mg Oral q1800  . chlorhexidine  15 mL Mouth Rinse BID  . folic acid  1 mg Oral Daily  . mouth rinse  15 mL Mouth Rinse q12n4p  . metoprolol tartrate  25 mg Oral BID  . multivitamin with minerals  1 tablet Oral Daily  . pantoprazole (PROTONIX) IV  40 mg Intravenous Q12H    Continuous Infusions: . ampicillin-sulbactam (UNASYN) IV 3 g (09/07/19 0533)  . dextrose 50 mL/hr at 09/06/19 2213     Time spent: 3mns I have personally reviewed and interpreted on  09/07/2019 daily labs, tele strips, imagings  as discussed above under date review session and assessment and plans.  I reviewed all nursing notes, pharmacy notes, consultant notes,  vitals, pertinent old records  I have discussed plan of care as described above with RN , patient  on 09/07/2019   FFlorencia ReasonsMD, PhD, FACP  [facility]  Available via Epic secure chat 7am-7pm for nonurgent issues Please page for urgent issues, pager number available through [email or web address] .   09/07/2019, 10:12 AM  LOS: 6 days

## 2019-09-07 NOTE — Progress Notes (Signed)
  Speech Language Pathology Treatment: Dysphagia  Patient Details Name: Tiffany Charles MRN: [id number] DOB: 23-May-1930 Today's Date: 09/07/2019 Time: 1449-1510 SLP Time Calculation (min) (ACUTE ONLY): 21 min  Assessment / Plan / Recommendation Clinical Impression  Pt was seen for dysphagia treatment with her son present. [name] and RN reported that the pt has been tolerating the current diet well. Pt's vocal intensity was reduced and request for clarification was often needed due to reduced speech intelligibility. She tolerated puree solids, dysphagia 2 solids, and thin liquids via cup and straw without overt s/sx of aspiration. Mastication time was increased and coughing was noted with regular texture solids suggesting possible aspiration. Pt's diet will be upgraded at this time to include thin liquid and SLP will continue to follow pt.   HPI HPI: 86 year old female with PMH as below, which is significant for Atrial fibrillation on Xarelto, COPD, CAD, and HTN. She was recently admitted to [facility] and treated for cellulitis with keflex and doxycycline. She presented to [facility] ED 4/8 hypoxemic and hypotensive, after being found altered at SNF with coffee-ground emesis beside her bed. Intubated 4/8-4/11.      SLP Plan  Continue with current plan of care       Recommendations  Diet recommendations: Dysphagia 1 (puree);Thin liquid Liquids provided via: Straw Medication Administration: Crushed with puree Supervision: Staff to assist with self feeding Compensations: Slow rate;Small sips/bites                Oral Care Recommendations: Oral care BID Follow up Recommendations: Skilled Nursing facility SLP Visit Diagnosis: Dysphagia, unspecified (R13.10) Plan: Continue with current plan of care       Mairim Bade I. Hardin Negus, [place], [place] Office number [phone] Pager [place] 09/07/2019, 5:06 PM

## 2019-09-07 NOTE — Progress Notes (Signed)
Nutrition Follow-up  DOCUMENTATION CODES:   Not applicable  INTERVENTION:   -Magic cup TID with meals, each supplement provides 290 kcal and 9 grams of protein -Hormel Shake TID with meals, each supplement provides 520 kcals and 22 grams protein -MVI with minerals daily -Ensure Enlive po BID, each supplement provides 350 kcal and 20 grams of protein  NUTRITION DIAGNOSIS:   Inadequate oral intake related to inability to eat as evidenced by NPO status.  Progressing; advanced to dysphagia 1 diet with nectar thick liquids  GOAL:   Patient will meet greater than or equal to 90% of their needs  Progressing   MONITOR:   PO intake, Supplement acceptance, Diet advancement, Labs, Weight trends, Skin, I & O's  REASON FOR ASSESSMENT:   Ventilator    ASSESSMENT:   86 yo female admitted with hemorrhagic shock due to GI hemorrhage. Required intubation on admission. PMH includes A fib, COPD, CAD, HTN.  4/11- extubated 4/12- s/p BSE- recommend continue NPO 4/13- s/p BSE- advanced to dysphagia 1 diet with nectar thick liquids  Attempted to speak with pt via phone, however, no answer.   Pt with minimal intake; noted meal completion 25%. Per SLP notes, pt is accepting of PO's and plan for MBSS later this week,  Medications reviewed and include folvite, MVI, and unasyn.   Per MD notes, plan to d/c to SNF at discharge.   Labs reviewed. CBGS: 107-115.   Diet Order:   Diet Order            DIET - DYS 1 Room service appropriate? Yes; Fluid consistency: Nectar Thick  Diet effective now              EDUCATION NEEDS:   No education needs have been identified at this time  Skin:  Skin Assessment: Skin Integrity Issues: Skin Integrity Issues:: Other (Comment) Other: non pressure wound to lt foot with swelling, skin tear to lt arm  Last BM:  09/02/19  Height:   Ht Readings from Last 1 Encounters:  09/01/19 5\' 6"  (1.676 m)    Weight:   Wt Readings from Last 1 Encounters:   09/07/19 66.9 kg    Ideal Body Weight:  59.1 kg  BMI:  Body mass index is 23.81 kg/m.  Estimated Nutritional Needs:   Kcal:  1500-1700  Protein:  75-90 grams  Fluid:  > 1.5 L    Loistine Chance, RD, LDN, [place] Registered Dietitian II Certified Diabetes Care and Education Specialist Please refer to [facility] for RD and/or RD on-call/weekend/after hours pager

## 2019-09-07 NOTE — Consult Note (Signed)
Consultation Note Date: 09/07/2019   Patient Name: Tiffany Charles  DOB: Aug 04, 1929  MRN: [id number]  Age / Sex: 86 y.o., female  PCP: Tiffany Lima, MD Referring Physician: [name], MD  Reason for Consultation: Establishing goals of care  HPI/Patient Profile: 86 y.o. female  with past medical history of permanent afib on Xarelto, COPD, CAD, HTN, occipital stroke, chronic diastolic heart failure, HLD, mild pulmonary hypertension, skin cancer, tricuspid regurgitation, recent hospital admission at [facility] for left foot cellulitis found to have arterial blockage s/p vascular procedure admitted on 09/01/2019 with altered mental status and coffee-ground emesis. In ED, patient was hypoxic and hypotensive, requiring intubation. Extubated 4/11. Hospital admission for hemorrhagic shock due to upper GI bleeding on Xarelto. GI following and deferred EGD due to acuity of condition, age, and comorbidities. Receiving medical management. Cardiology consulted for NSTEMI and ischemic cardiomyopathy EF 35-40%. Patient is not a candidate for ischemic evaluation given her anemia/GIB and not a candidate for DAPT. Worsening chest xray, empirically started on Unasyn for possible pneumonia. Dysphagia, SLP recommending dysphagia 1, thin liquids. Palliative medicine consultation for goals of care.  Clinical Assessment and Goals of Care:  I have reviewed medical records, discussed with care team, and patient assessment completed. Tiffany Charles is awake and alert but disoriented with baseline mild dementia per son at bedside. She is participating in SLP evaluation and diet will be advanced to thin liquids this afternoon. She does not appear to be in pain or discomfort.   Son, Tiffany Charles at bedside. He initially tells me he is POA and has been for 16 years. Asked about grandson, Tiffany Charles who is documented legal guardian. Paperwork reviewed in chart. Tiffany Charles shares that  Judson's legal guardianship is temporary (per paperwork, expires 10/01/19) and that he has hired an Forensic psychologist to obtain legal guardianship over his mother. Tiffany Charles tells me they have a court appointment April 22nd. He shares his thoughts that Tiffany Charles should not be POA because he lives in [place] and unable to see her. (Document reviewed in detail. APS is involved).   Tiffany Charles does provide a life review of his mother including her previous baseline. [name] has lived with Tiffany Charles for 16+ years in their home in [place]. The patient has two sons, [place]. The patient had a TIA in January 2021 and Tiffany Charles shares that her health has spiraled down since that time, with worsening mild dementia. She had a left foot wound which eventually led to hospitalization for cellulitis and vascular procedure for blockage on angiography. She was [facility] to [facility] for [facility]hab. Tiffany Charles shares that his mother hated being at the facility and he was hoping to bring her home soon. Tiffany Charles reports that since his mother's TIA, he does not leave her alone. He hires sitters when he needs to run errands, etc.   Tiffany Charles shares many frustrations about his nephew obtaining legal guardianship. Explained that legally, I need to discuss goals of care with Tiffany Charles since he is documented legal guardian.   Tiffany Charles shares that he did  not think his mother was going to survive when in the ER. He is glad to see she is taking steps in the right direction and wishes to bring her home on discharge. Explained that per chart review, Tiffany Charles would like her to return to SNF rehab. Tiffany Charles shares that many people are praying for his mother.     SUMMARY OF RECOMMENDATIONS    Patient's grandson, Tiffany Charles is documented legal guardian. Temporary guardianship until Oct 01, 2019. Court appointment late April. Son, Tiffany Charles is trying to obtain legal guardianship. APS involved.   DNR, continue current plan of care.   Clinical improvement. Continue PT/OT/SLP attempts.  Per chart  review, grandson requesting SNF rehab placement.   PMT will reach out to grandson to discuss goals of care.   May benefit from outpatient palliative referral.   Code Status/Advance Care Planning:  DNR  Symptom Management:   Per attending  Palliative Prophylaxis:   Aspiration, Delirium Protocol, Oral Care and Turn Reposition  Psycho-social/Spiritual:   Desire for further Chaplaincy support: yes  Additional Recommendations: Caregiving  Support/Resources, Compassionate Wean Education and Education on Hospice  Prognosis:   Guarded   Discharge Planning: To Be Determined      Primary Diagnoses: Present on Admission: . Permanent atrial fibrillation . COPD - PFTs in June 2014 with MET test . CAD, RCA BMS '09, 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 low risk Myoview Feb 2013 . Chronic diastolic (congestive) heart failure ([place]) . Mild pulmonary hypertension ([place]) . Occipital stroke ([place])   I have reviewed the medical record, interviewed the patient and family, and examined the patient. The following aspects are pertinent.  Past Medical History:  Diagnosis Date  . Angina pectoris   . Arthritis   . Chronic anticoagulation    Xarelto  . Chronic diastolic (congestive) heart failure ([place])   . COPD (chronic obstructive pulmonary disease) ([place])   . Coronary artery disease    a. s/p RCA stent 2008. b. cath 02/2009: 30% mLAD, 90% AV groove cx unchanged from prior, 50-60% RCA, EF 60% -> med rx.  . Depression   . Essential hypertension   . Hyperlipidemia   . Mild pulmonary hypertension ([place]) 09/17/2016  . Permanent atrial fibrillation ([place])   . Skin cancer Jan 2013   squamous cell- removed  . Tricuspid regurgitation 09/17/2016   Social History   Socioeconomic History  . Marital status: Widowed    Spouse name: Not on file  . Number of children: 2  . Years of education: Not on file  . Highest education level: Not on file  Occupational History  . Occupation: Retired    Tobacco Use  . Smoking status: Former Smoker    Packs/day: 0.50  . Smokeless tobacco: Never Used  Substance and Sexual Activity  . Alcohol use: No  . Drug use: No  . Sexual activity: Never  Other Topics Concern  . Not on file  Social History Narrative   Pt lives alone, son is nearby.   Social Determinants of Health   Financial Resource Strain:   . Difficulty of Paying Living Expenses:   Food Insecurity:   . Worried About Charity fundraiser in the Last Year:   . Arboriculturist in the Last Year:   Transportation Needs:   . Film/video editor (Medical):   Marland Kitchen Lack of Transportation (Non-Medical):   Physical Activity:   . Days of Exercise per Week:   . Minutes of Exercise per Session:   Stress:   .  Feeling of Stress :   Social Connections:   . Frequency of Communication with Friends and Family:   . Frequency of Social Gatherings with Friends and Family:   . Attends Religious Services:   . Active Member of Clubs or Organizations:   . Attends Archivist Meetings:   Marland Kitchen Marital Status:    Family History  Problem Relation Age of Onset  . Stroke Mother   . Heart disease Mother   . Coronary artery disease Father   . Heart disease Father   . Stroke Sister   . Heart disease Brother   . Stroke Sister   . Stroke Sister    Scheduled Meds: . atorvastatin  20 mg Oral q1800  . chlorhexidine  15 mL Mouth Rinse BID  . [START ON 09/08/2019] feeding supplement (ENSURE ENLIVE)  237 mL Oral BID BM  . folic acid  1 mg Oral Daily  . mouth rinse  15 mL Mouth Rinse q12n4p  . metoprolol tartrate  25 mg Oral BID  . multivitamin with minerals  1 tablet Oral Daily  . pantoprazole (PROTONIX) IV  40 mg Intravenous Q12H   Continuous Infusions: . ampicillin-sulbactam (UNASYN) IV 3 g (09/07/19 1350)   PRN Meds:.acetaminophen, docusate sodium, levalbuterol, metoprolol tartrate, ondansetron (ZOFRAN) IV, polyethylene glycol, Resource ThickenUp Clear Medications Prior to Admission:   Prior to Admission medications   Medication Sig Start Date End Date Taking? Authorizing Provider  acitretin (SORIATANE) 25 MG capsule Take 25 mg by mouth daily. 06/30/19  Yes [provider]  ascorbic acid (VITAMIN C) 500 MG tablet Take 500 mg by mouth daily.   Yes [provider]  aspirin EC 81 MG tablet Take 81 mg by mouth daily.    Yes [provider]  atorvastatin (LIPITOR) 40 MG tablet Take 1 tablet (40 mg total) by mouth daily. 07/01/19  Yes Tiffany Lima, MD  cholecalciferol (VITAMIN D3) 25 MCG (1000 UNIT) tablet Take 1,000 Units by mouth daily.   Yes [provider]  metoprolol succinate (TOPROL-XL) 25 MG 24 hr tablet Take 1 tablet (25 mg total) by mouth daily. 05/05/19  Yes Croitoru, Mihai, MD  Rivaroxaban (XARELTO) 15 MG TABS tablet Take 1 tablet (15 mg total) by mouth daily before supper. Patient taking differently: Take 15 mg by mouth daily with supper.  05/05/19  Yes Croitoru, Mihai, MD  vitamin B-12 (CYANOCOBALAMIN) 1000 MCG tablet Take 1,000 mcg by mouth daily.   Yes [provider]   Allergies  Allergen Reactions  . Codeine Nausea And Vomiting   Review of Systems  Unable to perform ROS: Mental status change    Physical Exam Vitals and nursing note reviewed.  Constitutional:      Appearance: She is ill-appearing.  HENT:     Head: Normocephalic and atraumatic.  Cardiovascular:     Rate and Rhythm: Rhythm irregularly irregular.  Pulmonary:     Effort: No tachypnea, accessory muscle usage or respiratory distress.  Abdominal:     Tenderness: There is no abdominal tenderness.  Skin:    General: Skin is warm and dry.  Neurological:     Mental Status: She is easily aroused.     Comments: Awake, alert, disoriented with baseline mild dementia.   Psychiatric:        Attention and Perception: She is inattentive.        Cognition and Memory: Cognition is impaired.    Vital Signs: BP (!) 153/115 (BP Location: Right Arm)   Pulse  Marland Kitchen)  103   Temp 98.6 F (37 C)   Resp 18   Ht 5' 6"  (1.676 m)   Wt 66.9 kg   SpO2 97%   BMI 23.81 kg/m  Pain Scale: PAINAD   Pain Score: 0-No pain   SpO2: SpO2: 97 % O2 Device:SpO2: 97 % O2 Flow Rate: .O2 Flow Rate (L/min): 2 L/min  IO: Intake/output summary:   Intake/Output Summary (Last 24 hours) at 09/07/2019 2121 Last data filed at 09/07/2019 1809 Gross per 24 hour  Intake 979.17 ml  Output 75 ml  Net 904.17 ml    LBM: Last BM Date: (unable to answer) Baseline Weight: Weight: 54 kg Most recent weight: Weight: 66.9 kg     Palliative Assessment/Data: PPS 30%     Time In: 1500 Time Out: 1615 Time Total: 62mn Greater than 50%  of this time was spent counseling and coordinating care related to the above assessment and plan.  Signed by:  MIhor Dow DNP, FNP-C Palliative Medicine Team  Phone: [phone]: [phone]  Please contact Palliative Medicine Team phone at [phone] questions and concerns.  For individual provider: See AShea Evans

## 2019-09-07 NOTE — TOC Initial Note (Signed)
Transition of Care [facility]) - Initial/Assessment Note    Patient Details  Name: Tiffany Charles MRN: [id number] Date of Birth: 01-16-1930  Transition of Care [facility]) CM/SW Contact:    Alberteen Sam, LCSW Phone Number: 09/07/2019, 2:36 PM  Clinical Narrative:                  CSW spoke with patient's son [name] regarding dc plan and SNF rec. CSW notes patient is from [facility], [name] reports he is POA and would prefer patient to go to [facility] as they have family there. [name] would like CSW to send referral to [facility] and start insurance auth if they are in network.   CSW has faxed [facility] referral, pending bed offer at this time.   Expected Discharge Plan: Skilled Nursing Facility Barriers to Discharge: Continued Medical Work up   Patient Goals and CMS Choice   CMS Medicare.gov Compare Post Acute Care list provided to:: Patient Represenative (must comment)(son [name]) Choice offered to / list presented to : [facility] POA / Guardian(Judson)  Expected Discharge Plan and Services Expected Discharge Plan: [place]       Living arrangements for the past 2 months: [place]                                      Prior Living Arrangements/Services Living arrangements for the past 2 months: [place] Lives with:: Self Patient language and need for interpreter reviewed:: Yes Do you feel safe going back to the place where you live?: Yes      Need for Family Participation in Patient Care: Yes (Comment) Care giver support system in place?: Yes (comment)   Criminal Activity/Legal Involvement Pertinent to Current Situation/Hospitalization: No - Comment as needed  Activities of Daily Living      Permission Sought/Granted Permission sought to share information with : Case Manager, Customer service manager, Family Supports Permission granted to share information with : Yes, Verbal Permission Granted  Share Information with  NAME: [name]  Permission granted to share info w AGENCY: SNFs  Permission granted to share info w Relationship: grandson  Permission granted to share info w Contact Information: [phone]  Emotional Assessment Appearance:: Appears stated age       Alcohol / Substance Use: Not Applicable Psych Involvement: No (comment)  Admission diagnosis:  Nontraumatic hemorrhagic shock ([place]) [R57.8] Patient Active Problem List   Diagnosis Date Noted  . Upper GI bleed   . Hemorrhagic shock ([place]) 09/01/2019  . Acute liver failure 09/01/2019  . Acute hypoxemic respiratory failure ([place]) 09/01/2019  . Cellulitis 08/09/2019  . Cellulitis of left foot   . Left foot pain 07/22/2019  . Open wound of left foot 07/22/2019  . DNR (do not resuscitate) 06/08/2019  . Occipital stroke ([place]) 06/07/2019  . Squamous cell carcinoma, leg, unspecified laterality 06/08/2018  . Basal cell carcinoma (BCC) of nasal tip 06/08/2018  . Senile dementia without behavioral disturbance ([place]) 04/29/2017  . Frequent PVCs 09/17/2016  . Tricuspid regurgitation 09/17/2016  . Mild pulmonary hypertension ([place]) 09/17/2016  . Chronic diastolic (congestive) heart failure ([place])   . Essential hypertension   . Generalized OA 12/28/2014  . Routine general medical examination at a health care facility 11/09/2013  . Depression, major ([place]) 11/07/2013  . Chronic anticoagulation- Xarelto 12/28/2012  . Permanent atrial fibrillation 10/28/2012  . COPD - PFTs in June 2014 with  MET test 10/28/2012  . CAD, RCA BMS '09, 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 low risk Myoview Feb 2013 10/28/2012  . Hyperlipidemia 10/28/2012   PCP:  [name], MD Pharmacy:   [facility] [address] [place]), [place] - [address] [place] [address] Phone: [phone] Fax: [phone]  [facility] - [place], [place] [address] Phone:  [phone] Fax: [phone]     Social Determinants of Health (SDOH) Interventions    Readmission Risk Interventions No flowsheet data found.

## 2019-09-07 NOTE — NC FL2 (Signed)
[place] LEVEL OF CARE SCREENING TOOL     IDENTIFICATION  Patient Name: Tiffany Charles Birthdate: [birth year] Sex: female Admission Date (Current Location): 09/01/2019  [facility] and [place] Number:  Herbalist and Address:  The . [facility], [place] [address] [place], [place] [address]      Provider Number: [id number]  Attending Physician Name and Address:  [name], MD  Relative Name and Phone Number:  [name] [phone]    Current Level of Care: Hospital Recommended Level of Care: [place] Prior Approval Number:    Date Approved/Denied:   PASRR Number: [id number]  Discharge Plan: SNF    Current Diagnoses: Patient Active Problem List   Diagnosis Date Noted  . Upper GI bleed   . Hemorrhagic shock ([place]) 09/01/2019  . Acute liver failure 09/01/2019  . Acute hypoxemic respiratory failure ([place]) 09/01/2019  . Cellulitis 08/09/2019  . Cellulitis of left foot   . Left foot pain 07/22/2019  . Open wound of left foot 07/22/2019  . DNR (do not resuscitate) 06/08/2019  . Occipital stroke ([place]) 06/07/2019  . Squamous cell carcinoma, leg, unspecified laterality 06/08/2018  . Basal cell carcinoma (BCC) of nasal tip 06/08/2018  . Senile dementia without behavioral disturbance ([place]) 04/29/2017  . Frequent PVCs 09/17/2016  . Tricuspid regurgitation 09/17/2016  . Mild pulmonary hypertension ([place]) 09/17/2016  . Chronic diastolic (congestive) heart failure ([place])   . Essential hypertension   . Generalized OA 12/28/2014  . Routine general medical examination at a health care facility 11/09/2013  . Depression, major ([place]) 11/07/2013  . Chronic anticoagulation- Xarelto 12/28/2012  . Permanent atrial fibrillation 10/28/2012  . COPD - PFTs in June 2014 with MET test 10/28/2012  . CAD, RCA BMS '09, 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 low risk Myoview Feb 2013 10/28/2012  . Hyperlipidemia 10/28/2012    Orientation  RESPIRATION BLADDER Height & Weight     Self  O2(2L O2 nasal cannula) Incontinent, External catheter Weight: 147 lb 7.8 oz (66.9 kg) Height:  5' 6"  (167.6 cm)  BEHAVIORAL SYMPTOMS/MOOD NEUROLOGICAL BOWEL NUTRITION STATUS      Continent Diet(see dishcarge summary)  AMBULATORY STATUS COMMUNICATION OF NEEDS Skin   Extensive Assist Verbally Other (Comment)(abrasions, ecchymosis on legs and arms, MASD buttocks, perineum, open wound left hand and feet)                       Personal Care Assistance Level of Assistance  Bathing, Feeding, Dressing, Total care Bathing Assistance: Maximum assistance Feeding assistance: Maximum assistance Dressing Assistance: Maximum assistance Total Care Assistance: Maximum assistance   Functional Limitations Info  Sight, Hearing, Speech Sight Info: Adequate Hearing Info: Adequate Speech Info: Adequate    SPECIAL CARE FACTORS FREQUENCY  PT (By licensed PT), OT (By licensed OT)     PT Frequency: min 5x weekly OT Frequency: min 5x weekly            Contractures Contractures Info: Not present    Additional Factors Info  Code Status, Allergies Code Status Info: DNR Allergies Info: Codeine           Current Medications (09/07/2019):  This is the current hospital active medication list Current Facility-Administered Medications  Medication Dose Route Frequency Provider Last Rate Last Admin  . acetaminophen (TYLENOL) tablet 650 mg  650 mg Oral Q4H PRN [name], MD      . Ampicillin-Sulbactam (UNASYN) 3 g in sodium chloride 0.9 %  100 mL IVPB  3 g Intravenous Q8H Beryle Lathe, RPH 200 mL/hr at 09/07/19 1350 3 g at 09/07/19 1350  . atorvastatin (LIPITOR) tablet 20 mg  20 mg Oral q1800 Neva Seat, MD   20 mg at 09/06/19 1852  . chlorhexidine (PERIDEX) 0.12 % solution 15 mL  15 mL Mouth Rinse BID Neva Seat, MD   15 mL at 09/07/19 0927  . docusate sodium (COLACE) capsule 100 mg  100 mg Oral BID PRN Neva Seat, MD       . Derrill Memo ON 09/08/2019] feeding supplement (ENSURE ENLIVE) (ENSURE ENLIVE) liquid 237 mL  237 mL Oral BID BM [name], MD      . folic acid (FOLVITE) tablet 1 mg  1 mg Oral Daily [name], MD   1 mg at 09/07/19 3295  . levalbuterol (XOPENEX) nebulizer solution 0.63 mg  0.63 mg Nebulization Q3H PRN Neva Seat, MD      . MEDLINE mouth rinse  15 mL Mouth Rinse q12n4p Neva Seat, MD   15 mL at 09/07/19 1351  . metoprolol tartrate (LOPRESSOR) injection 5 mg  5 mg Intravenous Q6H PRN Neva Seat, MD   5 mg at 09/06/19 2001  . metoprolol tartrate (LOPRESSOR) tablet 25 mg  25 mg Oral BID Antonieta Pert, MD   25 mg at 09/07/19 0928  . multivitamin with minerals tablet 1 tablet  1 tablet Oral Daily [name], MD      . ondansetron [facility]) injection 4 mg  4 mg Intravenous Q6H PRN Neva Seat, MD      . pantoprazole (PROTONIX) injection 40 mg  40 mg Intravenous Elsworth Soho, MD   40 mg at 09/07/19 1884  . polyethylene glycol (MIRALAX / GLYCOLAX) packet 17 g  17 g Oral Daily PRN [name], MD      . Resource ThickenUp Clear   Oral PRN Antonieta Pert, MD         Discharge Medications: Please see discharge summary for a list of discharge medications.  Relevant Imaging Results:  Relevant Lab Results:   Additional Information SSN: [id number]  Alberteen Sam, LCSW

## 2019-09-08 DIAGNOSIS — I5032 Chronic diastolic (congestive) heart failure: Secondary | ICD-10-CM | POA: Diagnosis not present

## 2019-09-08 DIAGNOSIS — R578 Other shock: Secondary | ICD-10-CM | POA: Diagnosis not present

## 2019-09-08 DIAGNOSIS — I4821 Permanent atrial fibrillation: Secondary | ICD-10-CM | POA: Diagnosis not present

## 2019-09-08 DIAGNOSIS — G9341 Metabolic encephalopathy: Secondary | ICD-10-CM | POA: Diagnosis not present

## 2019-09-08 DIAGNOSIS — J9601 Acute respiratory failure with hypoxia: Secondary | ICD-10-CM | POA: Diagnosis not present

## 2019-09-08 DIAGNOSIS — I639 Cerebral infarction, unspecified: Secondary | ICD-10-CM | POA: Diagnosis not present

## 2019-09-08 LAB — URINALYSIS, ROUTINE W REFLEX MICROSCOPIC
Bilirubin Urine: NEGATIVE
Glucose, UA: NEGATIVE mg/dL
Hgb urine dipstick: NEGATIVE
Ketones, ur: NEGATIVE mg/dL
Leukocytes,Ua: NEGATIVE
Nitrite: NEGATIVE
Protein, ur: NEGATIVE mg/dL
Specific Gravity, Urine: 1.009 (ref 1.005–1.030)
pH: 5 (ref 5.0–8.0)

## 2019-09-08 LAB — CBC
HCT: 45.6 % (ref 36.0–46.0)
Hemoglobin: 14.8 g/dL (ref 12.0–15.0)
MCH: 28.7 pg (ref 26.0–34.0)
MCHC: 32.5 g/dL (ref 30.0–36.0)
MCV: 88.4 fL (ref 80.0–100.0)
Platelets: 146 10*3/uL — ABNORMAL LOW (ref 150–400)
RBC: 5.16 MIL/uL — ABNORMAL HIGH (ref 3.87–5.11)
RDW: 17.1 % — ABNORMAL HIGH (ref 11.5–15.5)
WBC: 14.3 10*3/uL — ABNORMAL HIGH (ref 4.0–10.5)
nRBC: 0 % (ref 0.0–0.2)

## 2019-09-08 LAB — PROCALCITONIN: Procalcitonin: <0.1 ng/mL

## 2019-09-08 LAB — COMPREHENSIVE METABOLIC PANEL
ALT: 78 U/L — ABNORMAL HIGH (ref 0–44)
AST: 50 U/L — ABNORMAL HIGH (ref 15–41)
Albumin: 2.3 g/dL — ABNORMAL LOW (ref 3.5–5.0)
Alkaline Phosphatase: 64 U/L (ref 38–126)
Anion gap: 10 (ref 5–15)
BUN: 16 mg/dL (ref 8–23)
CO2: 20 mmol/L — ABNORMAL LOW (ref 22–32)
Calcium: 8.2 mg/dL — ABNORMAL LOW (ref 8.9–10.3)
Chloride: 109 mmol/L (ref 98–111)
Creatinine, Ser: 0.85 mg/dL (ref 0.44–1.00)
GFR calc Af Amer: >60 mL/min (ref >60)
GFR calc non Af Amer: >60 mL/min (ref >60)
Glucose, Bld: 84 mg/dL (ref 70–99)
Potassium: 3.3 mmol/L — ABNORMAL LOW (ref 3.5–5.1)
Sodium: 139 mmol/L (ref 135–145)
Total Bilirubin: 1.2 mg/dL (ref 0.3–1.2)
Total Protein: 5 g/dL — ABNORMAL LOW (ref 6.5–8.1)

## 2019-09-08 LAB — GLUCOSE, CAPILLARY
Glucose-Capillary: 179 mg/dL — ABNORMAL HIGH (ref 70–99)
Glucose-Capillary: 36 mg/dL — CL (ref 70–99)
Glucose-Capillary: 40 mg/dL — CL (ref 70–99)
Glucose-Capillary: 44 mg/dL — CL (ref 70–99)
Glucose-Capillary: 65 mg/dL — ABNORMAL LOW (ref 70–99)
Glucose-Capillary: 82 mg/dL (ref 70–99)
Glucose-Capillary: 84 mg/dL (ref 70–99)
Glucose-Capillary: 91 mg/dL (ref 70–99)
Glucose-Capillary: 92 mg/dL (ref 70–99)
Glucose-Capillary: 97 mg/dL (ref 70–99)

## 2019-09-08 LAB — SARS CORONAVIRUS 2 (TAT 6-24 HRS): SARS Coronavirus 2: NEGATIVE

## 2019-09-08 MED ORDER — POTASSIUM CHLORIDE CRYS ER 20 MEQ PO TBCR
40.0000 meq | EXTENDED_RELEASE_TABLET | Freq: Once | ORAL | Status: AC
  Administered 2019-09-08: 15:00:00 40 meq via ORAL
  Filled 2019-09-08: qty 2

## 2019-09-08 MED ORDER — SODIUM CHLORIDE 0.9 % IV SOLN
INTRAVENOUS | Status: DC | PRN
  Administered 2019-09-08 – 2019-09-25 (×2): 250 mL via INTRAVENOUS

## 2019-09-08 MED ORDER — TAMSULOSIN HCL 0.4 MG PO CAPS
0.4000 mg | ORAL_CAPSULE | Freq: Every day | ORAL | Status: DC
  Administered 2019-09-08 – 2019-09-27 (×20): 0.4 mg via ORAL
  Filled 2019-09-08 (×21): qty 1

## 2019-09-08 MED ORDER — FUROSEMIDE 10 MG/ML IJ SOLN
40.0000 mg | Freq: Once | INTRAMUSCULAR | Status: AC
  Administered 2019-09-08: 15:00:00 40 mg via INTRAVENOUS
  Filled 2019-09-08: qty 4

## 2019-09-08 MED ORDER — DEXTROSE 50 % IV SOLN
INTRAVENOUS | Status: AC
  Administered 2019-09-08: 50 mL
  Filled 2019-09-08: qty 50

## 2019-09-08 MED ORDER — DEXTROSE 50 % IV SOLN: 50.0000 mL | Freq: Once | INTRAVENOUS | Status: AC

## 2019-09-08 MED ORDER — SENNOSIDES-DOCUSATE SODIUM 8.6-50 MG PO TABS
1.0000 | ORAL_TABLET | Freq: Two times a day (BID) | ORAL | Status: DC
  Administered 2019-09-08 – 2019-09-27 (×27): 1 via ORAL
  Filled 2019-09-08 (×33): qty 1

## 2019-09-08 MED ORDER — MINERAL OIL RE ENEM
1.0000 | ENEMA | Freq: Once | RECTAL | Status: AC
  Administered 2019-09-09: 09:00:00 1 via RECTAL
  Filled 2019-09-08: qty 1

## 2019-09-08 NOTE — TOC Progression Note (Addendum)
Transition of Care (TOC) - Progression Note  *Patient will return to [facility] at discharge   Patient Details  Name: Tiffany Charles MRN: [id number] Date of Birth: 1929/06/18  Transition of Care [facility]) CM/SW Contact  Sharlet Salina Mila Homer, LCSW Phone Number: 09/08/2019, 5:32 PM  Clinical Narrative:  Talked with patient's grandson Betsey Gerstle - [phone] (12 noon) regarding [name]'s readiness for discharge and discharge disposition. Mr. Whitehill informed that [facility] declined. Son indicated that he did not want patient to return to [facility] due to problems with communication. Grandson advised that patient's information would be sent to all facilities in [facility] and [place] would f/u with him in an hour or so.   1:24 pm: Call made to grandson and message left regarding one bed offer - [facility] and names of SNF's that declined and why.  3:06 pm: Voice mail from grandson that patient can return to [facility]. Talked later with son regarding decision to return to [facility]. 4:51 pm: Talked with admissions director Janie at [facility] and patient can return. She was provided with grandson's phone number to contact him regarding admissions paperwork completion. Insurance auth needed and Narda Rutherford advised that [employer] will be contacted to determine if patient's insurance managed by them. 5:50 pm: Contacted [employer] and they do not manage patient.   MD contacted via secure chat and advised that OT order and COVID test needed.        Expected Discharge Plan: [place] Barriers to Discharge: Continued Medical Work up  Expected Discharge Plan and Services Expected Discharge Plan: [place] arrangements for the past 2 months: [place]                                       Social Determinants of Health (SDOH) Interventions    Readmission Risk Interventions No flowsheet data found.

## 2019-09-08 NOTE — Progress Notes (Signed)
Pt has urinated three times tonight after removing purewick. Estimated volume is 1000cc. Transferred to [facility]. Small bowel movement. Will continue to assess need for foley and enema as night continues.

## 2019-09-08 NOTE — Progress Notes (Signed)
Daily Progress Note   Patient Name: Tiffany Charles       Date: 09/08/2019 DOB: 1929-08-01  Age: 86 y.o. MRN#: [id number] Attending Physician: Tiffany Reasons, MD Primary Care Physician: Tiffany Lima, MD  Admit Date: 09/01/2019  Reason for Consultation/Follow-up: Establishing goals of care  Subjective: This AM, patient wakes to voice. She is oriented to name, otherwise disoriented with pleasant confusion. She will follow commands. Denies pain and appears to be comfortable during visit.   GOC: This afternoon, spoke with grandson, Tiffany Charles via telephone. Introduced role of palliative medicine. Tiffany Charles tells me his grandmother may be discharged to [facility] this afternoon.   Discussed course of hospitalization including diagnoses, interventions, plan of care. Tiffany Charles is beyond thankful for the care his grandmother has received at [facility]. He speaks of being fearful she was not going to make it through the night on Sunday. He is glad to see she is taking strides in the right direction but does seem to understand she has a "rough road" ahead of her.   Tiffany Charles explains family dynamics and plan for guardianship re-appointment in the following weeks. Tiffany Charles shares that she almost became ward of the state.  I attempted to elicit values and goals of care important to the grandson. Most importantly, Tiffany Charles wishes for his grandmother to be safe, comfortable, and taken care of. Ultimately, he is hopefully to eventually have her moved into the same nursing facility as her sister, Tiffany Charles (he is hoping for [facility]). Tiffany Charles is hopeful she will show progression with physical therapy at rehab.   Palliative Care services outpatient were explained and offered. Tiffany Charles agreeable to outpatient palliative referral at [facility].  All questions answered.    Length of Stay: 7  Current Medications: Scheduled Meds:  . atorvastatin  20 mg Oral q1800  . chlorhexidine  15 mL Mouth Rinse BID  . feeding supplement (ENSURE ENLIVE)  237 mL Oral BID BM  . folic acid  1 mg Oral Daily  . mouth rinse  15 mL Mouth Rinse q12n4p  . metoprolol tartrate  25 mg Oral BID  . multivitamin with minerals  1 tablet Oral Daily  . pantoprazole (PROTONIX) IV  40 mg Intravenous Q12H    Continuous Infusions: . ampicillin-sulbactam (UNASYN) IV 3 g (09/08/19 0547)    PRN Meds: acetaminophen, docusate  sodium, levalbuterol, metoprolol tartrate, ondansetron (ZOFRAN) IV, polyethylene glycol, Resource ThickenUp Clear  Physical Exam Vitals and nursing note reviewed.  Constitutional:      General: She is awake.     Appearance: She is cachectic. She is ill-appearing.  HENT:     Head: Normocephalic and atraumatic.  Cardiovascular:     Rate and Rhythm: Rhythm irregularly irregular.  Pulmonary:     Effort: No tachypnea, accessory muscle usage or respiratory distress.  Skin:    General: Skin is warm and dry.  Neurological:     Mental Status: She is alert.     Comments: Oriented to name, otherwise disoriented with baseline dementia  Psychiatric:        Mood and Affect: Mood normal.        Cognition and Memory: Cognition is impaired.            Vital Signs: BP (!) 144/89   Pulse 84   Temp 98.6 F (37 C) (Oral)   Resp 18   Ht 5' 6"  (1.676 m)   Wt 66.7 kg   SpO2 98%   BMI 23.73 kg/m  SpO2: SpO2: 98 % O2 Device: O2 Device: Room Air O2 Flow Rate: O2 Flow Rate (L/min): 2 L/min  Intake/output summary:   Intake/Output Summary (Last 24 hours) at 09/08/2019 0904 Last data filed at 09/08/2019 9470 Gross per 24 hour  Intake 550 ml  Output 425 ml  Net 125 ml   LBM: Last BM Date: (unable to answer) Baseline Weight: Weight: 54 kg Most recent weight: Weight: 66.7 kg       Palliative Assessment/Data: PPS 40%      Patient Active  Problem List   Diagnosis Date Noted  . Palliative care by specialist   . Upper GI bleed   . Hemorrhagic shock ([name]) 09/01/2019  . Acute liver failure 09/01/2019  . Acute hypoxemic respiratory failure ([place]) 09/01/2019  . Cellulitis 08/09/2019  . Cellulitis of left foot   . Left foot pain 07/22/2019  . Open wound of left foot 07/22/2019  . DNR (do not resuscitate) 06/08/2019  . Occipital stroke ([place]) 06/07/2019  . Squamous cell carcinoma, leg, unspecified laterality 06/08/2018  . Basal cell carcinoma (BCC) of nasal tip 06/08/2018  . Senile dementia without behavioral disturbance (Tiffany Charles) 04/29/2017  . Frequent PVCs 09/17/2016  . Tricuspid regurgitation 09/17/2016  . Mild pulmonary hypertension ([place]) 09/17/2016  . Weakness 09/16/2016  . Chronic diastolic (congestive) heart failure ([place])   . Essential hypertension   . Generalized OA 12/28/2014  . Goals of care, counseling/discussion 11/09/2013  . Depression, major ([place]) 11/07/2013  . Chronic anticoagulation- Xarelto 12/28/2012  . Permanent atrial fibrillation 10/28/2012  . COPD - PFTs in June 2014 with MET test 10/28/2012  . CAD, RCA BMS '09, 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 low risk Myoview Feb 2013 10/28/2012  . Hyperlipidemia 10/28/2012    Palliative Care Assessment & Plan   Patient Profile: 86 y.o. female  with past medical history of permanent afib on Xarelto, COPD, CAD, HTN, occipital stroke, chronic diastolic heart failure, HLD, mild pulmonary hypertension, skin cancer, tricuspid regurgitation, recent hospital admission at [facility] for left foot cellulitis found to have arterial blockage s/p vascular procedure admitted on 09/01/2019 with altered mental status and coffee-ground emesis. In ED, patient was hypoxic and hypotensive, requiring intubation. Extubated 4/11. Hospital admission for hemorrhagic shock due to upper GI bleeding on Xarelto. GI following and deferred EGD due to acuity of condition, age, and comorbidities.  Receiving  medical management. Cardiology consulted for NSTEMI and ischemic cardiomyopathy EF 35-40%. Patient is not a candidate for ischemic evaluation given her anemia/GIB and not a candidate for DAPT. Worsening chest xray, empirically started on Unasyn for possible pneumonia. Dysphagia, SLP recommending dysphagia 1, thin liquids. Palliative medicine consultation for goals of care.  Assessment: Hemorrhagic shock Upper GI bleeding Acute hypoxemic respiratory failure Afib RVR NSTEMI/ischemic cardiomyopathy AKI Transaminitis, likely shock liver Dysphagia Acute metabolic encephalopathy, multifactorial PVD/left foot wound Mild dementia Recent TIA  Recommendations/Plan:  Continue current plan of care and medical management.  Continue PT/OT/SLP efforts. Patient progressing.   Lisabeth Register is documented legal guardian.   Pending discharge to SNF rehab. Tiffany Charles agreeable with outpatient palliative referral at [facility] for ongoing [place] discussions. May benefit from MOST form completion.  Code Status: DNR   Code Status Orders  (From admission, onward)         Start     Ordered   09/01/19 2119  Do not attempt resuscitation (DNR)  Continuous    Question Answer Comment  In the event of cardiac or respiratory ARREST Do not call a "code blue"   In the event of cardiac or respiratory ARREST Do not perform Intubation, CPR, defibrillation or ACLS   In the event of cardiac or respiratory ARREST Use medication by any route, position, wound care, and other measures to relive pain and suffering. May use oxygen, suction and manual treatment of airway obstruction as needed for comfort.      09/01/19 2122        Code Status History    Date Active Date Inactive Code Status Order ID Comments User Context   08/09/2019 2104 08/13/2019 1522 Full Code [id number]  Elodia Florence., MD Inpatient   06/07/2019 1150 06/08/2019 2320 DNR [id number]  Karmen Bongo, MD ED   09/16/2016 2127 09/17/2016 2015 Full  Code [id number]  Barrett, Evelene Croon, PA-C Inpatient   07/30/2015 1941 08/01/2015 1642 Full Code [id number]  Ivor Costa, MD ED   12/28/2012 2320 12/31/2012 2130 Full Code [id number]  Orvan Falconer, MD Inpatient   Advance Care Planning Activity       Prognosis:  Guarded  Discharge Planning:  [place] for rehab with Palliative care service follow-up   Care plan was discussed with RN, grandson Tiffany Charles), updated Dr. Erlinda Hong  Thank you for allowing the Palliative Medicine Team to assist in the care of this patient.   Time In: 0910- 1605- Time Out: 0920 1620 Total Time 25 Prolonged Time Billed   no      Greater than 50%  of this time was spent counseling and coordinating care related to the above assessment and plan.  Ihor Dow, DNP, FNP-C Palliative Medicine Team  Phone: [phone] Fax: [phone]  Please contact Palliative Medicine Team phone at [phone] for questions and concerns.

## 2019-09-08 NOTE — Progress Notes (Signed)
Pt's lower abdomen is distended and tender to touch. Pt verbalizes discomfort upon palpitation.  Bladder scan done and had urine retention of 800x2 today. In and out was ordered.  Ordered for indwelling foley and enema placed after second in and out was completed.  Pt is anxious after in and out. Foley and enema to be completed later today.  Spoke with MD to make aware.

## 2019-09-08 NOTE — Progress Notes (Addendum)
Hypoglycemic Event  CBG: 65  Treatment: D50 jet  Symptoms: asymptomatic  Follow-up CBG: Time:12227 CBG Result:179  Possible Reasons for Event: pt did not eat much of her breakfast.  Comments/MD notified: Erlinda Hong MD    Julianne Handler

## 2019-09-08 NOTE — Care Management Important Message (Signed)
Important Message  Patient Details  Name: Tiffany Charles MRN: [id number] Date of Birth: 08-Sep-1929   Medicare Important Message Given:  Yes     [name] 09/08/2019, 9:19 AM

## 2019-09-08 NOTE — Progress Notes (Signed)
PROGRESS NOTE  MARLISE Charles [id number] DOB: July 18, 1929 DOA: 09/01/2019 PCP: Tiffany Lima, MD  Brief Narrative: 86 year old female with multiple complex comorbidities including permanent A. fib on Xarelto, COPD, CAD, hypertension, chronic diastolic CHF, CAD, hyperlipidemia, mild pulmonary hypertension, skin cancer, TR, recently admitted to [facility] for cellulitis presented to [facility] ED 4/8 after found altered at skilled nursing rehab and coffee-ground emesis at bedside.  In the ED was hypoxic and hypotensive and confused needing intubation.  Blood work showed INR more than 10, undetectable blood count, was given Kcentra in the ED and blood transfusion was initiated.  GI and cardiology was consulted.  Patient admitted to ICU, urine blood culture were sent, empirically placed on IV antibiotics.  CT abdomen pelvis on admission no acute GI bleed, large amount of stool at the level of patient's rectum, echo showed EF 35 to 40%, regional wall motion abnormalities, RV systolic function severely reduced, severely dilated left atria, right atria moderate MR mild to moderate TR. Urine culture with 5000 Enterococcus faecalis-, blood culture no growth to date. Patient was treated for acute respiratory failure, extubated 4/11, upper GI bleed Xarelto reversed with Kcentra needed for unit PRBC EGD deferred due to age/comorbidities, also A. fib with RVR, non-ST elevation MI/ischemic cardiomyopathy no further intervention per cardiology. Patient transferred to [facility] 09/06/2019    HPI/Recap of past 24 hours:  She is pleasantly confused,  She is only oriented to person, not a reliable historian She denies pain, does not appear in acute distress Cr/lft  Improved Wbc elevated but trending down On 2liter oxygen  Bilateral lower extremity edema. + 10liters per I/o's, ivf d/ced   She got tired after getting up to use the bedside commode   Assessment/Plan: Principal Problem:   Hemorrhagic shock [facility]) Active  Problems:   Permanent atrial fibrillation   COPD - PFTs in June 2014 with MET test   CAD, RCA BMS '09, 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 low risk Myoview Feb 2013   Chronic anticoagulation- Xarelto   Goals of care, counseling/discussion   Chronic diastolic (congestive) heart failure (HCC)   Weakness   Mild pulmonary hypertension (HCC)   Occipital stroke (HCC)   Acute liver failure   Acute hypoxemic respiratory failure (HCC)   Upper GI bleed   Palliative care by specialist   Hemorrhagic shock 2/2 bleeding- Upper GI bleed - home meds Xarelto discontinued -reversed with Kcentra, 1 of cryo, platelets, s/p 4 units PRBC, seen by GI, EGD deferred due to age/comorbidities.  HB stable. On Protonix IV twice daily, off daily vitamin K  (inr 1.1), INR is stable  Acute hypoxemic respiratory failure History of COPD - CXR 4/10 showed minimal vascular congestion   - Successful extubation 4/11 -No wheezing, lung sounds diminished, wean oxygen as tolerated   Afib/RVR Not a candidate for anticoagulation Remains in A. fib , rate controlled on lopressor, uptitrate as bp allows  Non-ST elevation MI/ischemic cardiomyopathy  H/o CAD s/p BMS in 2009 trop in 15k->2593, echo showed EF 35 to 40% reduced with regional wall motion normalities, seen by cardiology and no further intervention per cardiology, cannot use aspirin On statin  ivf d/ced on 4/13 + 10liter with lower extremity edema, will give 1 dose of Lasix  Hypokalemia, replace K, repeat in the morning, check mag  AKI/metabolic acidosis ischemic ATN versus contrast nephropathy renal function improving.  Monitor  Transaminitis, likely shock liver, LFTs downtrending.  Monitor.  Dysphagia On pureed diet/honey thick liquid Currently on unasyn  for possible aspiration pneumonia  Acute Metabolic Encephalopathy she is confused, suspect multifactorial.cont supportive care.  PT OT evaluation.  Reorient and minimize sedatives H/o cva in  05/2019, suspect underline Dementia  PVD/Chronic Left foot wound S/p Percutaneous transluminal angioplasty and stent placement left superficial femoral artery and popliteal on 9/56 Daily application of small foam dressing for foot wound.  vagina MASD  Antifungal powder q 4 hours to vaginal folds  Poor prognosis, palliative care consulted, input appreciated   DVT Prophylaxis:scd  Code Status: DNR  Family Communication: patient , grandson (who is the POA) only per request  Disposition Plan:    Patient came from:                snf                                                                                          Anticipated d/c place: snf   Barriers to d/c OR conditions which need to be met to effect a safe d/c: pending palliative care consult   Consultants:  Cardiology   Gi  Wound care  Critical care  Procedures:  Intubation/extubation  Antibiotics: Flagyl 4/8  Cefepime 4/8 > 4/9 Vancomycin 4/8   Objective: BP 130/65 (BP Location: Left Arm)   Pulse 90   Temp (!) 97.5 F (36.4 C) (Oral)   Resp 18   Ht 5' 6"  (1.676 m)   Wt 66.7 kg   SpO2 100%   BMI 23.73 kg/m   Intake/Output Summary (Last 24 hours) at 09/08/2019 1203 Last data filed at 09/08/2019 0900 Gross per 24 hour  Intake 670 ml  Output 350 ml  Net 320 ml   Filed Weights   09/06/19 0500 09/07/19 0033 09/08/19 0411  Weight: 66.9 kg 66.9 kg 66.7 kg    Exam: Patient is examined daily including today on 09/08/2019, exams remain the same as of yesterday except that has changed    General: Very weak ,chronically ill, pleasantly confused  Cardiovascular: IRRR  Respiratory: CTABL  Abdomen: Soft/ND/NT, positive BS  Musculoskeletal: bilateral lower extremity pitting Edema  Neuro: alert, oriented to person  Data Reviewed: Basic Metabolic Panel: Recent Labs  Lab 09/02/19 0424 09/02/19 1047 09/03/19 0332 09/03/19 0332 09/04/19 0511 09/04/19 0511 09/04/19 1358 09/05/19 0422  09/06/19 0531 09/07/19 0455 09/08/19 0518  NA 140   < > 141   < > 139   < > 139 140 135 137 139  K 4.8   < > 3.4*   < > 3.2*   < > 4.0 3.8 3.9 3.7 3.3*  CL 108  --  111   < > 111   < > 115* 114* 107 110 109  CO2 17*  --  18*   < > 18*   < > 17* 19* 18* 17* 20*  GLUCOSE 110*  --  90   < > 148*   < > 148* 136* 141* 112* 84  BUN 61*  --  69*   < > 54*   < > 50* 43* 31* 20 16  CREATININE 1.63*  --  2.10*   < > 1.85*   < >  1.67* 1.37* 1.16* 0.89 0.85  CALCIUM 7.8*  --  7.6*   < > 7.7*   < > 7.4* 7.9* 8.1* 8.1* 8.2*  MG 2.0  --  1.8  --  1.8  --   --  2.3 2.1  --   --   PHOS 8.6*  --  5.3*  --  3.5  --   --  2.9 2.3* 2.0*  --    < > = values in this interval not displayed.   Liver Function Tests: Recent Labs  Lab 09/04/19 0511 09/05/19 0422 09/06/19 0531 09/07/19 0455 09/08/19 0518  AST 181* 121* 80* 56* 50*  ALT 233* 186* 143* 95* 78*  ALKPHOS 56 60 64 69 64  BILITOT 1.3* 1.5* 1.6* 1.3* 1.2  PROT 4.6* 5.0* 5.6* 5.1* 5.0*  ALBUMIN 2.4* 2.5* 2.5* 2.3* 2.3*   Recent Labs  Lab 09/02/19 0030  LIPASE 33   No results for input(s): AMMONIA in the last 168 hours. CBC: Recent Labs  Lab 09/01/19 1925 09/01/19 1925 09/01/19 2143 09/01/19 2356 09/03/19 0332 09/03/19 0332 09/04/19 0511 09/05/19 0422 09/06/19 0531 09/07/19 0455 09/08/19 0518  WBC Not Measured   < > 10.7*   < > 11.7*   < > 10.9* 12.4* 14.9* 13.7* 14.3*  NEUTROABS Not Measured  --  8.7*  --  9.0*  --   --   --   --   --   --   HGB Not Measured   < > 5.5*   < > 13.5   < > 13.4 13.2 14.3 13.9 14.8  HCT Not Measured   < > 20.2*   < > 39.2   < > 38.9 39.6 43.4 43.6 45.6  MCV Not Measured   < > 105.8*   < > 84.3   < > 83.8 88.0 87.9 88.1 88.4  PLT Not Measured   < > 295   < > 217   < > 183 149* 146* 148* 146*   < > = values in this interval not displayed.   Cardiac Enzymes:   No results for input(s): CKTOTAL, CKMB, CKMBINDEX, TROPONINI in the last 168 hours. BNP (last 3 results) Recent Labs    06/05/19 1352  BNP  194.0*    ProBNP (last 3 results) No results for input(s): PROBNP in the last 8760 hours.  CBG: Recent Labs  Lab 09/07/19 1620 09/07/19 2013 09/08/19 0007 09/08/19 0356 09/08/19 0733  GLUCAP 88 99 91 92 84    Recent Results (from the past 240 hour(s))  Blood culture (routine x 2)     Status: None   Collection Time: 09/01/19  7:25 PM   Specimen: BLOOD  Result Value Ref Range Status   Specimen Description BLOOD LEFT UPPER  Final   Special Requests   Final    BOTTLES DRAWN AEROBIC AND ANAEROBIC Blood Culture results may not be optimal due to an excessive volume of blood received in culture bottles   Culture   Final    NO GROWTH 5 DAYS Performed at [facility], [place] [address], [place], [place] [address]    Report Status 09/06/2019 FINAL  Final  Urine culture     Status: Abnormal   Collection Time: 09/01/19  7:30 PM   Specimen: In/Out Cath Urine  Result Value Ref Range Status   Specimen Description IN/OUT CATH URINE  Final   Special Requests   Final    NONE Performed at [facility], [place]  [address] [place], [place] [address]    Culture 5,000 COLONIES/mL ENTEROCOCCUS FAECALIS (A)  Final   Report Status 09/04/2019 FINAL  Final   Organism ID, Bacteria ENTEROCOCCUS FAECALIS (A)  Final      Susceptibility   Enterococcus faecalis - MIC*    AMPICILLIN <=2 SENSITIVE Sensitive     NITROFURANTOIN <=16 SENSITIVE Sensitive     VANCOMYCIN 1 SENSITIVE Sensitive     * 5,000 COLONIES/mL ENTEROCOCCUS FAECALIS  Respiratory Panel by RT PCR (Flu A&B, Covid) - Nasopharyngeal Swab     Status: None   Collection Time: 09/01/19  8:12 PM   Specimen: Nasopharyngeal Swab  Result Value Ref Range Status   SARS Coronavirus 2 by RT PCR NEGATIVE NEGATIVE Final    Comment: (NOTE) SARS-CoV-2 target nucleic acids are NOT DETECTED. The SARS-CoV-2 RNA is generally detectable in upper respiratoy specimens during the acute phase of infection. The lowest concentration of SARS-CoV-2 viral  copies this assay can detect is 131 copies/mL. A negative result does not preclude SARS-Cov-2 infection and should not be used as the sole basis for treatment or other patient management decisions. A negative result may occur with  improper specimen collection/handling, submission of specimen other than nasopharyngeal swab, presence of viral mutation(s) within the areas targeted by this assay, and inadequate number of viral copies (<131 copies/mL). A negative result must be combined with clinical observations, patient history, and epidemiological information. The expected result is Negative. Fact Sheet for Patients:  PinkCheek.be Fact Sheet for Healthcare Providers:  GravelBags.it This test is not yet ap proved or cleared by the [place] FDA and  has been authorized for detection and/or diagnosis of SARS-CoV-2 by FDA under an Emergency Use Authorization (EUA). This EUA will remain  in effect (meaning this test can be used) for the duration of the COVID-19 declaration under Section 564(b)(1) of the Act, 21 U.S.C. section 360bbb-3(b)(1), unless the authorization is terminated or revoked sooner.    Influenza A by PCR NEGATIVE NEGATIVE Final   Influenza B by PCR NEGATIVE NEGATIVE Final    Comment: (NOTE) The Xpert Xpress SARS-CoV-2/FLU/RSV assay is intended as an aid in  the diagnosis of influenza from Nasopharyngeal swab specimens and  should not be used as a sole basis for treatment. Nasal washings and  aspirates are unacceptable for Xpert Xpress SARS-CoV-2/FLU/RSV  testing. Fact Sheet for Patients: PinkCheek.be Fact Sheet for Healthcare Providers: GravelBags.it This test is not yet approved or cleared by the [place] FDA and  has been authorized for detection and/or diagnosis of SARS-CoV-2 by  FDA under an Emergency Use Authorization (EUA). This EUA will  remain  in effect (meaning this test can be used) for the duration of the  Covid-19 declaration under Section 564(b)(1) of the Act, 21  U.S.C. section 360bbb-3(b)(1), unless the authorization is  terminated or revoked. Performed at [facility], [place] [address] [place], [place] [address]   MRSA PCR Screening     Status: None   Collection Time: 09/01/19 11:30 PM   Specimen: Nasal Mucosa; Nasopharyngeal  Result Value Ref Range Status   MRSA by PCR NEGATIVE NEGATIVE Final    Comment:        The GeneXpert MRSA Assay (FDA approved for NASAL specimens only), is one component of a comprehensive MRSA colonization surveillance program. It is not intended to diagnose MRSA infection nor to guide or monitor treatment for MRSA infections. Performed at [facility], [place] [address] [place], [place] [address]  Blood culture (routine x 2)     Status: None   Collection Time: 09/01/19 11:57 PM   Specimen: BLOOD  Result Value Ref Range Status   Specimen Description BLOOD CENTRAL LINE  Final   Special Requests   Final    BOTTLES DRAWN AEROBIC ONLY Blood Culture adequate volume Performed at [facility], [place] [address] [place], [place] [address]    Culture NO GROWTH 5 DAYS  Final   Report Status 09/07/2019 FINAL  Final     Studies: No results found.  Scheduled Meds: . atorvastatin  20 mg Oral q1800  . chlorhexidine  15 mL Mouth Rinse BID  . feeding supplement (ENSURE ENLIVE)  237 mL Oral BID BM  . folic acid  1 mg Oral Daily  . mouth rinse  15 mL Mouth Rinse q12n4p  . metoprolol tartrate  25 mg Oral BID  . multivitamin with minerals  1 tablet Oral Daily  . pantoprazole (PROTONIX) IV  40 mg Intravenous Q12H  . potassium chloride  40 mEq Oral Once    Continuous Infusions: . ampicillin-sulbactam (UNASYN) IV 3 g (09/08/19 0547)     Time spent: 5mns I have personally reviewed and interpreted on  09/08/2019 daily labs, tele strips, imagings as discussed above  under date review session and assessment and plans.  I reviewed all nursing notes, pharmacy notes, consultant notes,  vitals, pertinent old records  I have discussed plan of care as described above with RN , patient  on 09/08/2019   FFlorencia ReasonsMD, PhD, FACP  [facility]  Available via Epic secure chat 7am-7pm for nonurgent issues Please page for urgent issues, pager number available through [email or web address] .   09/08/2019, 12:03 PM  LOS: 7 days

## 2019-09-08 NOTE — Progress Notes (Signed)
Pt's family member is  At bedside.

## 2019-09-08 NOTE — Progress Notes (Signed)
Physical Therapy Treatment Patient Details Name: Tiffany Charles MRN: [id number] DOB: 02-29-1930 Today's Date: 09/08/2019    History of Present Illness 86 year old female with PMH as below, which is significant for Atrial fibrillation on Xarelto, COPD, CAD, and HTN. She was recently admitted to [facility] and treated for cellulitis with keflex and doxycycline. She presented to [facility] ED 4/8 hypoxemic and hypotensive, after being found altered at SNF with coffee-ground emesis beside her bed. Intubated 4/8-4/11.    PT Comments    Pt initially reluctant to work with therapy but eventually agreeable to sit EoB. Once EoB pt reports need to use BSC. Pt is min guard for bed mobility, min A for transfer and min A for ambulation from bed to [facility] and BSC to HoB. D/c plans remain appropriate at this time. PT will continue to follow acutely.    Follow Up Recommendations  SNF     Equipment Recommendations  Other (comment)(TBA)       Precautions / Restrictions Precautions Precautions: Fall Restrictions Weight Bearing Restrictions: No    Mobility  Bed Mobility Overal bed mobility: Needs Assistance Bed Mobility: Supine to Sit     Supine to sit: Min guard Sit to supine: Supervision   General bed mobility comments: min guard for safety, vc for sequencing, supervision for pt to return LE to bed  Transfers Overall transfer level: Needs assistance Equipment used: Rolling walker (2 wheeled) Transfers: Sit to/from Stand Sit to Stand: Min assist         General transfer comment: min A for power up and steadying in standing from bed and from [facility]  Ambulation/Gait Ambulation/Gait assistance: Min assist Gait Distance (Feet): 5 Feet Assistive device: Rolling walker (2 wheeled) Gait Pattern/deviations: Step-to pattern Gait velocity: slower   General Gait Details: min A for steadying and RW management to ambulate from [facility] at foot of bed to HoB to return to supine          Balance Overall  balance assessment: Mild deficits observed, not formally tested   Sitting balance-Leahy Scale: Fair     Standing balance support: Bilateral upper extremity supported Standing balance-Leahy Scale: Fair Standing balance comment: maintained static standing EoB while BSC moved in place                             Cognition Arousal/Alertness: Awake/alert Behavior During Therapy: WFL for tasks assessed/performed Overall Cognitive Status: No family/caregiver present to determine baseline cognitive functioning                                 General Comments: Oriented to person         General Comments General comments (skin integrity, edema, etc.): replaced telemetry electrodes as pt had removed them, VSS with mobility       Pertinent Vitals/Pain Pain Assessment: No/denies pain           PT Goals (current goals can now be found in the care plan section) Acute Rehab PT Goals Patient Stated Goal: pt unable to relate goals PT Goal Formulation: With patient Time For Goal Achievement: 09/20/19 Potential to Achieve Goals: Good Progress towards PT goals: Progressing toward goals    Frequency    Min 2X/week      PT Plan Current plan remains appropriate       AM-PAC PT "6 Clicks" Mobility   Outcome Measure  Help needed turning from your back to your side while in a flat bed without using bedrails?: A Little Help needed moving from lying on your back to sitting on the side of a flat bed without using bedrails?: A Little Help needed moving to and from a bed to a chair (including a wheelchair)?: A Little Help needed standing up from a chair using your arms (e.g., wheelchair or bedside chair)?: A Little Help needed to walk in hospital room?: A Little Help needed climbing 3-5 steps with a railing? : A Little 6 Click Score: 18    End of Session Equipment Utilized During Treatment: Gait belt Activity Tolerance: Patient tolerated treatment well;Patient  limited by fatigue Patient left: in bed;with call bell/phone within reach;with bed alarm set;with SCD's reapplied(MD in room) Nurse Communication: Mobility status PT Visit Diagnosis: Unsteadiness on feet (R26.81);Muscle weakness (generalized) (M62.81)     Time: [id number] PT Time Calculation (min) (ACUTE ONLY): 27 min  Charges:  $Gait Training: 8-22 mins $Therapeutic Activity: 8-22 mins                     Bevely Hackbart B. Migdalia Dk PT, DPT Acute Rehabilitation Services Pager [phone] Office [phone]    [place] 09/08/2019, 2:14 PM

## 2019-09-09 ENCOUNTER — Inpatient Hospital Stay (HOSPITAL_COMMUNITY): Payer: Medicare HMO

## 2019-09-09 DIAGNOSIS — J9601 Acute respiratory failure with hypoxia: Secondary | ICD-10-CM | POA: Diagnosis not present

## 2019-09-09 DIAGNOSIS — R578 Other shock: Secondary | ICD-10-CM | POA: Diagnosis not present

## 2019-09-09 DIAGNOSIS — G9341 Metabolic encephalopathy: Secondary | ICD-10-CM | POA: Diagnosis not present

## 2019-09-09 DIAGNOSIS — I4821 Permanent atrial fibrillation: Secondary | ICD-10-CM | POA: Diagnosis not present

## 2019-09-09 LAB — PHOSPHORUS: Phosphorus: 2.5 mg/dL (ref 2.5–4.6)

## 2019-09-09 LAB — COMPREHENSIVE METABOLIC PANEL
ALT: 67 U/L — ABNORMAL HIGH (ref 0–44)
AST: 52 U/L — ABNORMAL HIGH (ref 15–41)
Albumin: 2.6 g/dL — ABNORMAL LOW (ref 3.5–5.0)
Alkaline Phosphatase: 66 U/L (ref 38–126)
Anion gap: 12 (ref 5–15)
BUN: 15 mg/dL (ref 8–23)
CO2: 23 mmol/L (ref 22–32)
Calcium: 8.5 mg/dL — ABNORMAL LOW (ref 8.9–10.3)
Chloride: 108 mmol/L (ref 98–111)
Creatinine, Ser: 0.91 mg/dL (ref 0.44–1.00)
GFR calc Af Amer: >60 mL/min (ref >60)
GFR calc non Af Amer: 57 mL/min — ABNORMAL LOW (ref >60)
Glucose, Bld: 98 mg/dL (ref 70–99)
Potassium: 3.1 mmol/L — ABNORMAL LOW (ref 3.5–5.1)
Sodium: 143 mmol/L (ref 135–145)
Total Bilirubin: 1.6 mg/dL — ABNORMAL HIGH (ref 0.3–1.2)
Total Protein: 5.3 g/dL — ABNORMAL LOW (ref 6.5–8.1)

## 2019-09-09 LAB — CBC WITH DIFFERENTIAL/PLATELET
Abs Immature Granulocytes: 0.12 10*3/uL — ABNORMAL HIGH (ref 0.00–0.07)
Basophils Absolute: 0 10*3/uL (ref 0.0–0.1)
Basophils Relative: 0 %
Eosinophils Absolute: 0.1 10*3/uL (ref 0.0–0.5)
Eosinophils Relative: 1 %
HCT: 45.7 % (ref 36.0–46.0)
Hemoglobin: 14.8 g/dL (ref 12.0–15.0)
Immature Granulocytes: 1 %
Lymphocytes Relative: 11 %
Lymphs Abs: 1.5 10*3/uL (ref 0.7–4.0)
MCH: 28.5 pg (ref 26.0–34.0)
MCHC: 32.4 g/dL (ref 30.0–36.0)
MCV: 87.9 fL (ref 80.0–100.0)
Monocytes Absolute: 1.1 10*3/uL — ABNORMAL HIGH (ref 0.1–1.0)
Monocytes Relative: 8 %
Neutro Abs: 11.1 10*3/uL — ABNORMAL HIGH (ref 1.7–7.7)
Neutrophils Relative %: 79 %
Platelets: 158 10*3/uL (ref 150–400)
RBC: 5.2 MIL/uL — ABNORMAL HIGH (ref 3.87–5.11)
RDW: 17.2 % — ABNORMAL HIGH (ref 11.5–15.5)
WBC: 14 10*3/uL — ABNORMAL HIGH (ref 4.0–10.5)
nRBC: 0 % (ref 0.0–0.2)

## 2019-09-09 LAB — GLUCOSE, CAPILLARY
Glucose-Capillary: 100 mg/dL — ABNORMAL HIGH (ref 70–99)
Glucose-Capillary: 106 mg/dL — ABNORMAL HIGH (ref 70–99)
Glucose-Capillary: 107 mg/dL — ABNORMAL HIGH (ref 70–99)
Glucose-Capillary: 118 mg/dL — ABNORMAL HIGH (ref 70–99)
Glucose-Capillary: 147 mg/dL — ABNORMAL HIGH (ref 70–99)
Glucose-Capillary: 72 mg/dL (ref 70–99)

## 2019-09-09 LAB — URINE CULTURE: Culture: NO GROWTH

## 2019-09-09 LAB — MAGNESIUM: Magnesium: 1.6 mg/dL — ABNORMAL LOW (ref 1.7–2.4)

## 2019-09-09 MED ORDER — AMOXICILLIN-POT CLAVULANATE 500-125 MG PO TABS
500.0000 mg | ORAL_TABLET | Freq: Two times a day (BID) | ORAL | Status: AC
  Administered 2019-09-09 – 2019-09-12 (×7): 500 mg via ORAL
  Filled 2019-09-09 (×7): qty 1

## 2019-09-09 MED ORDER — MAGNESIUM SULFATE 2 GM/50ML IV SOLN
2.0000 g | Freq: Once | INTRAVENOUS | Status: AC
  Administered 2019-09-09: 2 g via INTRAVENOUS
  Filled 2019-09-09: qty 50

## 2019-09-09 MED ORDER — FUROSEMIDE 10 MG/ML IJ SOLN
40.0000 mg | Freq: Once | INTRAMUSCULAR | Status: AC
  Administered 2019-09-09: 21:00:00 40 mg via INTRAVENOUS
  Filled 2019-09-09: qty 4

## 2019-09-09 MED ORDER — POTASSIUM CHLORIDE CRYS ER 20 MEQ PO TBCR
40.0000 meq | EXTENDED_RELEASE_TABLET | ORAL | Status: AC
  Administered 2019-09-09 (×3): 40 meq via ORAL
  Filled 2019-09-09 (×3): qty 2

## 2019-09-09 MED ORDER — METOPROLOL TARTRATE 25 MG PO TABS
37.5000 mg | ORAL_TABLET | Freq: Two times a day (BID) | ORAL | Status: DC
  Administered 2019-09-09 – 2019-09-11 (×4): 37.5 mg via ORAL
  Filled 2019-09-09 (×4): qty 1

## 2019-09-09 NOTE — Progress Notes (Signed)
Pharmacy Antibiotic Note  Tiffany Charles is a 86 y.o. female admitted on 09/01/2019 with pneumonia. Pharmacy has been consulted for Unasyn dosing.  4/13 CXR shows "Interval worsening bibasilar hazy opacification likely effusions with atelectasis. Infection in the lung bases is possible"  WBC 14.9>>14.0, afebrile, creatinine improved, PCT <0.10.   Plan: Unasyn 3g IV Q8h  F/u clinical progress, de-escalation, and LOT  Height: 5\' 6"  (167.6 cm) Weight: 66.1 kg (145 lb 11.6 oz) IBW/kg (Calculated) : 59.3  Temp (24hrs), Avg:97.8 F (36.6 C), Min:97.3 F (36.3 C), Max:98.6 F (37 C)  Recent Labs  Lab 09/02/19 1437 09/02/19 2110 09/03/19 0332 09/04/19 0511 09/05/19 0422 09/06/19 0531 09/07/19 0455 09/08/19 0518 09/09/19 0443  WBC 15.1*   < > 11.7*   < > 12.4* 14.9* 13.7* 14.3* 14.0*  CREATININE  --   --  2.10*   < > 1.37* 1.16* 0.89 0.85 0.91  LATICACIDVEN 2.0*  --   --   --   --   --   --   --   --   VANCORANDOM  --   --  9  --   --   --   --   --   --    < > = values in this interval not displayed.    Estimated Creatinine Clearance: 40.8 mL/min (by C-G formula based on SCr of 0.91 mg/dL).    Allergies  Allergen Reactions  . Codeine Nausea And Vomiting    Antimicrobials this admission: Flagyl x1 Cefepime 4/8 >>4/10 Vancomycin 4/8>>4/10 Unasyn 4/13>>  Dose adjustments this admission: N/A  Microbiology results: 4/8 Bcx: ngtd 4/8: Ucx: 5K Enterococcus faecalis 4/8 MRSA PCR: neg    Breck Hollinger L. Devin Going, [place] PGY1 Pharmacy Resident [phone] 09/09/19      8:00 AM  Please check AMION for all [place] phone numbers After 10:00 PM, call the [place] [phone]

## 2019-09-09 NOTE — Progress Notes (Signed)
PROGRESS NOTE  Tiffany Charles BEE:[id number] DOB: [birth year] DOA: 09/01/2019 PCP: [name], MD  Brief Narrative: 86 year old female with multiple complex comorbidities including permanent A. fib on Xarelto, COPD, CAD, hypertension, chronic diastolic CHF, CAD, hyperlipidemia, mild pulmonary hypertension, skin cancer, TR, recently admitted to [facility] for cellulitis presented to [facility] ED 4/8 after found altered at skilled nursing rehab and coffee-ground emesis at bedside.  In the ED was hypoxic and hypotensive and confused needing intubation.  Blood work showed INR more than 10, undetectable blood count, was given Kcentra in the ED and blood transfusion was initiated.  GI and cardiology was consulted.  Patient admitted to ICU, urine blood culture were sent, empirically placed on IV antibiotics.  CT abdomen pelvis on admission no acute GI bleed, large amount of stool at the level of patient's rectum, echo showed EF 35 to 40%, regional wall motion abnormalities, RV systolic function severely reduced, severely dilated left atria, right atria moderate MR mild to moderate TR. Urine culture with 5000 Enterococcus faecalis-, blood culture no growth to date. Patient was treated for acute respiratory failure, extubated 4/11, upper GI bleed Xarelto reversed with Kcentra needed for unit PRBC EGD deferred due to age/comorbidities, also A. fib with RVR, non-ST elevation MI/ischemic cardiomyopathy no further intervention per cardiology. Patient transferred to [facility] 09/06/2019    HPI/Recap of past 24 hours:  She is pleasantly confused,  She is only oriented to person, not a reliable historian She denies pain, does not appear in acute distress  Wbc remain  elevated On 2liter oxygen  Bilateral lower extremity edema is improving She is tachycardic  today    Assessment/Plan: Principal Problem:   Hemorrhagic shock ([place]) Active Problems:   Permanent atrial fibrillation   COPD - PFTs in June 2014 with MET  test   CAD, RCA BMS '09, 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 low risk Myoview Feb 2013   Chronic anticoagulation- Xarelto   Goals of care, counseling/discussion   Chronic diastolic (congestive) heart failure (HCC)   Weakness   Mild pulmonary hypertension (HCC)   Occipital stroke (HCC)   Acute liver failure   Acute hypoxemic respiratory failure (HCC)   Upper GI bleed   Palliative care by specialist   Hemorrhagic shock 2/2 bleeding- Upper GI bleed - home meds Xarelto discontinued -reversed with Kcentra, 1 of cryo, platelets, s/p 4 units PRBC, seen by GI, EGD deferred due to age/comorbidities.  HB stable. On Protonix IV twice daily, off daily vitamin K  (inr 1.1), INR is stable  Acute hypoxemic respiratory failure History of COPD - CXR 4/10 showed minimal vascular congestion   - Successful extubation 4/11 -No wheezing, lung sounds diminished, wean oxygen as tolerated -on unasyn for possible aspiration pneumonia, will change to augmentin to minimize volume -wbc remain elevated, will repeat cxr   Afib/RVR Not a candidate for anticoagulation Remains in A. fib ,  She is tachycardia today, increase lopressor, uptitrate as bp allows  Non-ST elevation MI/ischemic cardiomyopathy  H/o CAD s/p BMS in 2009 trop in 15k->2593, echo showed EF 35 to 40% reduced with regional wall motion normalities, seen by cardiology and no further intervention per cardiology, cannot use aspirin On statin  ivf d/ced on 4/13 + 10liter with lower extremity edema, s/p iv Lasix on 4/15, repeat cxr today, may need another dose  Hypokalemia/hypomagnesemia,  Remain low , continue to replace Repeat lab in am  AKI/metabolic acidosis ischemic ATN versus contrast nephropathy  renal function normalized.  Monitor  Transaminitis, likely shock liver, LFTs downtrending.  Monitor. Check hepatitis panel, ck level  Dysphagia On pureed diet/honey thick liquid Currently on unasyn for possible aspiration  pneumonia  Acute Metabolic Encephalopathy she is confused, suspect multifactorial.cont supportive care.  PT OT evaluation.  Reorient and minimize sedatives H/o cva in 05/2019, suspect underline Dementia Continue lipitor Check ammonia level  PVD/Chronic Left foot wound S/p Percutaneous transluminal angioplasty and stent placement left superficial femoral artery and popliteal on 1/82 Daily application of small foam dressing for foot wound.  vagina MASD  Antifungal powder q 4 hours to vaginal folds  Poor prognosis, palliative care consulted, input appreciated   DVT Prophylaxis:scd  Code Status: DNR  Family Communication: patient , grandson (who is the POA) only per request  Disposition Plan:    Patient came from:                snf                                                                                          Anticipated d/c place: snf   Barriers to d/c OR conditions which need to be met to effect a safe d/c: tachycardic, leukocytosis, repeating cxr   Consultants:  Cardiology   Gi  Wound care  Critical care  Procedures:  Intubation/extubation  Antibiotics: Flagyl 4/8  Cefepime 4/8 > 4/9 Vancomycin 4/8 unasyn from 4/13 to 4/16 then augmentin   Objective: BP (!) 137/93 (BP Location: Right Arm)   Pulse (!) 125   Temp (!) 97.3 F (36.3 C) (Oral)   Resp 18   Ht 5' 6"  (1.676 m)   Wt 66.1 kg   SpO2 99%   BMI 23.52 kg/m   Intake/Output Summary (Last 24 hours) at 09/09/2019 0708 Last data filed at 09/09/2019 0523 Gross per 24 hour  Intake 893.15 ml  Output 3050 ml  Net -2156.85 ml   Filed Weights   09/07/19 0033 09/08/19 0411 09/09/19 0500  Weight: 66.9 kg 66.7 kg 66.1 kg    Exam: Patient is examined daily including today on 09/09/2019, exams remain the same as of yesterday except that has changed    General: Very weak ,chronically ill, pleasantly confused  Cardiovascular: IRRR, tachycardia  Respiratory: diminished ,no wheezing  appreciated   Abdomen: Soft/ND/NT, positive BS  Musculoskeletal: bilateral lower extremity pitting Edema, improving   Neuro: alert, oriented to person only  Data Reviewed: Basic Metabolic Panel: Recent Labs  Lab 09/03/19 0332 09/03/19 0332 09/04/19 0511 09/04/19 1358 09/05/19 0422 09/06/19 0531 09/07/19 0455 09/08/19 0518 09/09/19 0443  NA 141   < > 139   < > 140 135 137 139 143  K 3.4*   < > 3.2*   < > 3.8 3.9 3.7 3.3* 3.1*  CL 111   < > 111   < > 114* 107 110 109 108  CO2 18*   < > 18*   < > 19* 18* 17* 20* 23  GLUCOSE 90   < > 148*   < > 136* 141* 112* 84 98  BUN 69*   < > 54*   < > 43* 31* 20  16 15  CREATININE 2.10*   < > 1.85*   < > 1.37* 1.16* 0.89 0.85 0.91  CALCIUM 7.6*   < > 7.7*   < > 7.9* 8.1* 8.1* 8.2* 8.5*  MG 1.8  --  1.8  --  2.3 2.1  --   --  1.6*  PHOS 5.3*   < > 3.5  --  2.9 2.3* 2.0*  --  2.5   < > = values in this interval not displayed.   Liver Function Tests: Recent Labs  Lab 09/05/19 0422 09/06/19 0531 09/07/19 0455 09/08/19 0518 09/09/19 0443  AST 121* 80* 56* 50* 52*  ALT 186* 143* 95* 78* 67*  ALKPHOS 60 64 69 64 66  BILITOT 1.5* 1.6* 1.3* 1.2 1.6*  PROT 5.0* 5.6* 5.1* 5.0* 5.3*  ALBUMIN 2.5* 2.5* 2.3* 2.3* 2.6*   No results for input(s): LIPASE, AMYLASE in the last 168 hours. No results for input(s): AMMONIA in the last 168 hours. CBC: Recent Labs  Lab 09/03/19 0332 09/04/19 0511 09/05/19 0422 09/06/19 0531 09/07/19 0455 09/08/19 0518 09/09/19 0443  WBC 11.7*   < > 12.4* 14.9* 13.7* 14.3* 14.0*  NEUTROABS 9.0*  --   --   --   --   --  11.1*  HGB 13.5   < > 13.2 14.3 13.9 14.8 14.8  HCT 39.2   < > 39.6 43.4 43.6 45.6 45.7  MCV 84.3   < > 88.0 87.9 88.1 88.4 87.9  PLT 217   < > 149* 146* 148* 146* 158   < > = values in this interval not displayed.   Cardiac Enzymes:   No results for input(s): CKTOTAL, CKMB, CKMBINDEX, TROPONINI in the last 168 hours. BNP (last 3 results) Recent Labs    06/05/19 1352  BNP 194.0*     ProBNP (last 3 results) No results for input(s): PROBNP in the last 8760 hours.  CBG: Recent Labs  Lab 09/08/19 1222 09/08/19 1716 09/08/19 2048 09/09/19 0005 09/09/19 0503  GLUCAP 179* 82 97 100* 72    Recent Results (from the past 240 hour(s))  Blood culture (routine x 2)     Status: None   Collection Time: 09/01/19  7:25 PM   Specimen: BLOOD  Result Value Ref Range Status   Specimen Description BLOOD LEFT UPPER  Final   Special Requests   Final    BOTTLES DRAWN AEROBIC AND ANAEROBIC Blood Culture results may not be optimal due to an excessive volume of blood received in culture bottles   Culture   Final    NO GROWTH 5 DAYS Performed at [facility], [place] [address], [place], [place] [address]    Report Status 09/06/2019 FINAL  Final  Urine culture     Status: Abnormal   Collection Time: 09/01/19  7:30 PM   Specimen: In/Out Cath Urine  Result Value Ref Range Status   Specimen Description IN/OUT CATH URINE  Final   Special Requests   Final    NONE Performed at [facility], [place] [address] [place], [place] [address]    Culture 5,000 COLONIES/mL ENTEROCOCCUS FAECALIS (A)  Final   Report Status 09/04/2019 FINAL  Final   Organism ID, Bacteria ENTEROCOCCUS FAECALIS (A)  Final      Susceptibility   Enterococcus faecalis - MIC*    AMPICILLIN <=2 SENSITIVE Sensitive     NITROFURANTOIN <=16 SENSITIVE Sensitive     VANCOMYCIN 1 SENSITIVE Sensitive     * 5,000  COLONIES/mL ENTEROCOCCUS FAECALIS  Respiratory Panel by RT PCR (Flu A&B, Covid) - Nasopharyngeal Swab     Status: None   Collection Time: 09/01/19  8:12 PM   Specimen: Nasopharyngeal Swab  Result Value Ref Range Status   SARS Coronavirus 2 by RT PCR NEGATIVE NEGATIVE Final    Comment: (NOTE) SARS-CoV-2 target nucleic acids are NOT DETECTED. The SARS-CoV-2 RNA is generally detectable in upper respiratoy specimens during the acute phase of infection. The lowest concentration of SARS-CoV-2 viral copies  this assay can detect is 131 copies/mL. A negative result does not preclude SARS-Cov-2 infection and should not be used as the sole basis for treatment or other patient management decisions. A negative result may occur with  improper specimen collection/handling, submission of specimen other than nasopharyngeal swab, presence of viral mutation(s) within the areas targeted by this assay, and inadequate number of viral copies (<131 copies/mL). A negative result must be combined with clinical observations, patient history, and epidemiological information. The expected result is Negative. Fact Sheet for Patients:  PinkCheek.be Fact Sheet for Healthcare Providers:  GravelBags.it This test is not yet ap proved or cleared by the [place] FDA and  has been authorized for detection and/or diagnosis of SARS-CoV-2 by FDA under an Emergency Use Authorization (EUA). This EUA will remain  in effect (meaning this test can be used) for the duration of the COVID-19 declaration under Section 564(b)(1) of the Act, 21 U.S.C. section 360bbb-3(b)(1), unless the authorization is terminated or revoked sooner.    Influenza A by PCR NEGATIVE NEGATIVE Final   Influenza B by PCR NEGATIVE NEGATIVE Final    Comment: (NOTE) The Xpert Xpress SARS-CoV-2/FLU/RSV assay is intended as an aid in  the diagnosis of influenza from Nasopharyngeal swab specimens and  should not be used as a sole basis for treatment. Nasal washings and  aspirates are unacceptable for Xpert Xpress SARS-CoV-2/FLU/RSV  testing. Fact Sheet for Patients: PinkCheek.be Fact Sheet for Healthcare Providers: GravelBags.it This test is not yet approved or cleared by the [place] FDA and  has been authorized for detection and/or diagnosis of SARS-CoV-2 by  FDA under an Emergency Use Authorization (EUA). This EUA will remain  in  effect (meaning this test can be used) for the duration of the  Covid-19 declaration under Section 564(b)(1) of the Act, 21  U.S.C. section 360bbb-3(b)(1), unless the authorization is  terminated or revoked. Performed at [facility], [place] [address] [place], [place] [address]   MRSA PCR Screening     Status: None   Collection Time: 09/01/19 11:30 PM   Specimen: Nasal Mucosa; Nasopharyngeal  Result Value Ref Range Status   MRSA by PCR NEGATIVE NEGATIVE Final    Comment:        The GeneXpert MRSA Assay (FDA approved for NASAL specimens only), is one component of a comprehensive MRSA colonization surveillance program. It is not intended to diagnose MRSA infection nor to guide or monitor treatment for MRSA infections. Performed at [facility], [place] [address], [place], [place] [address]   Blood culture (routine x 2)     Status: None   Collection Time: 09/01/19 11:57 PM   Specimen: BLOOD  Result Value Ref Range Status   Specimen Description BLOOD CENTRAL LINE  Final   Special Requests   Final    BOTTLES DRAWN AEROBIC ONLY Blood Culture adequate volume Performed at [facility], [place] [address] [place], [place] [address]    Culture NO GROWTH 5  DAYS  Final   Report Status 09/07/2019 FINAL  Final  SARS CORONAVIRUS 2 (TAT 6-24 HRS) Nasopharyngeal Nasopharyngeal Swab     Status: None   Collection Time: 09/08/19  5:27 PM   Specimen: Nasopharyngeal Swab  Result Value Ref Range Status   SARS Coronavirus 2 NEGATIVE NEGATIVE Final    Comment: (NOTE) SARS-CoV-2 target nucleic acids are NOT DETECTED. The SARS-CoV-2 RNA is generally detectable in upper and lower respiratory specimens during the acute phase of infection. Negative results do not preclude SARS-CoV-2 infection, do not rule out co-infections with other pathogens, and should not be used as the sole basis for treatment or other patient management decisions. Negative results must be combined with clinical  observations, patient history, and epidemiological information. The expected result is Negative. Fact Sheet for Patients: SugarRoll.be Fact Sheet for Healthcare Providers: https://www.woods-mathews.com/ This test is not yet approved or cleared by the [place] FDA and  has been authorized for detection and/or diagnosis of SARS-CoV-2 by FDA under an Emergency Use Authorization (EUA). This EUA will remain  in effect (meaning this test can be used) for the duration of the COVID-19 declaration under Section 56 4(b)(1) of the Act, 21 U.S.C. section 360bbb-3(b)(1), unless the authorization is terminated or revoked sooner. Performed at [facility], [place] [address], [place], [place] [address]      Studies: No results found.  Scheduled Meds: . atorvastatin  20 mg Oral q1800  . chlorhexidine  15 mL Mouth Rinse BID  . feeding supplement (ENSURE ENLIVE)  237 mL Oral BID BM  . folic acid  1 mg Oral Daily  . mouth rinse  15 mL Mouth Rinse q12n4p  . metoprolol tartrate  25 mg Oral BID  . mineral oil  1 enema Rectal Once  . multivitamin with minerals  1 tablet Oral Daily  . pantoprazole (PROTONIX) IV  40 mg Intravenous Q12H  . potassium chloride  40 mEq Oral Q4H  . senna-docusate  1 tablet Oral BID  . tamsulosin  0.4 mg Oral Daily    Continuous Infusions: . sodium chloride 10 mL/hr at 09/08/19 2300  . ampicillin-sulbactam (UNASYN) IV 3 g (09/09/19 0523)  . magnesium sulfate bolus IVPB       Time spent: 46mns I have personally reviewed and interpreted on  09/09/2019 daily labs, tele strips, imagings as discussed above under date review session and assessment and plans.  I reviewed all nursing notes, pharmacy notes, consultant notes,  vitals, pertinent old records  I have discussed plan of care as described above with RN , patient  on 09/09/2019   FFlorencia ReasonsMD, PhD, FACP  [facility]  Available via Epic secure chat 7am-7pm  for nonurgent issues Please page for urgent issues, pager number available through [email or web address] [email or web address] .   09/09/2019, 7:08 AM  LOS: 8 days

## 2019-09-09 NOTE — TOC Progression Note (Addendum)
Transition of Care [facility]) - Progression Note    Patient Details  Name: Tiffany Charles MRN: [id number] Date of Birth: 08-Sep-1929  Transition of Care [facility]) CM/SW [place], [place] Phone Number: 09/09/2019, 8:34 AM  Clinical Narrative:     CSW spoke with Narda Rutherford at [facility] who reports they started insurance auth with [facility] and are also working with patient's grandson on completing paperwork, they will keep [facility] team informed once Josem Kaufmann is received and all documents completed.   CSW spoke with Bradd Canary with [facility] for palliative outpatient to follow at [facility], referral made and she will follow up.   Expected Discharge Plan: [place] Barriers to Discharge: Continued Medical Work up  Expected Discharge Plan and Services Expected Discharge Plan: [place] arrangements for the past 2 months: [place]                                       Social Determinants of Health (SDOH) Interventions    Readmission Risk Interventions No flowsheet data found.

## 2019-09-09 NOTE — Progress Notes (Signed)
MEWS score 3.  No acute change.  MD aware of high HR.  Will continue to monitor.

## 2019-09-09 NOTE — Progress Notes (Signed)
Ship broker received referral for pt to participate in [facility] palliative program once pt discharges. Called pt's grandson, [name], to confirm interest with no answer. Liaison to follow while in hospital to alert [facility] palliative team who will then outreach family/SNF to arrange Palliative admission visit.     Please do not hesitate to call with any questions and thank you for the referral.     Freddie Breech, RN  [facility] Liaison   [phone]

## 2019-09-09 NOTE — Evaluation (Signed)
Occupational Therapy Evaluation Patient Details Name: Tiffany Charles MRN: [id number] DOB: May 07, 1930 Today's Date: 09/09/2019    History of Present Illness 86 year old female with PMH as below, which is significant for Atrial fibrillation on Xarelto, COPD, CAD, and HTN. She was recently admitted to [facility] and treated for cellulitis with keflex and doxycycline. She presented to [facility] ED 4/8 hypoxemic and hypotensive, after being found altered at SNF with coffee-ground emesis beside her bed. Intubated 4/8-4/11.   Clinical Impression   Pt admitted with above diagnoses, unable to provide PLOF due to being poor historian. Per chart review pt is from SNF. At time of eval, pt presents quite disoriented and talking to people not present in the room. She had difficulty maintaining her attention on basic BADL and mobility tasks. Pt completed supine <> sit at max A level. Once sitting EOB pt required intermittent min A mostly for safety. Attempted to initiate self grooming with pt, in which she required max A with hand over hand A. RN requested bed level only due to recent enema and confusion this date. Recommend pt return to SNF. Will continue to follow per POC listed below.    Follow Up Recommendations  SNF;Supervision/Assistance - 24 hour    Equipment Recommendations  None recommended by OT    Recommendations for Other Services       Precautions / Restrictions Precautions Precautions: Fall Restrictions Weight Bearing Restrictions: No      Mobility Bed Mobility Overal bed mobility: Needs Assistance Bed Mobility: Supine to Sit;Sit to Supine     Supine to sit: Max assist Sit to supine: Max assist   General bed mobility comments: max A to initiate transfer, bring legs to EOB and pull up at trunk; as well to return to bed. Suspect this to be more of a processing issue than mobility  Transfers                 General transfer comment: RN requested bed level only due to recent  enema    Balance Overall balance assessment: Needs assistance Sitting-balance support: Feet unsupported;Bilateral upper extremity supported Sitting balance-Leahy Scale: Poor Sitting balance - Comments: intermitent assist to maintain upright sitting                                   ADL either performed or assessed with clinical judgement   ADL Overall ADL's : Needs assistance/impaired Eating/Feeding: Maximal assistance;Sitting   Grooming: Maximal assistance;Brushing hair;Sitting                                 General ADL Comments: max A to inititate brushing hair this date with hand over hand assist, pt very fixated on talking to others in room that were not present. Otherwise total A for BADL due to cognitive status     Vision Patient Visual Report: No change from baseline       Perception     Praxis      Pertinent Vitals/Pain Pain Assessment: Faces Faces Pain Scale: Hurts little more Pain Location: unable to consistently report but stated yes when asked about stomach Pain Descriptors / Indicators: Grimacing;Guarding Pain Intervention(s): Limited activity within patient's tolerance;Monitored during session;Repositioned     Hand Dominance Right   Extremity/Trunk Assessment Upper Extremity Assessment Upper Extremity Assessment: Generalized weakness   Lower Extremity Assessment Lower Extremity  Assessment: Generalized weakness       Communication Communication Communication: Expressive difficulties;Other (comment)(mumbling)   Cognition Arousal/Alertness: Awake/alert Behavior During Therapy: WFL for tasks assessed/performed Overall Cognitive Status: No family/caregiver present to determine baseline cognitive functioning Area of Impairment: Orientation;Attention;Memory;Following commands;Safety/judgement;Awareness;Problem solving                 Orientation Level: Disoriented to;Place;Time;Situation Current Attention Level:  Focused Memory: Decreased short-term memory Following Commands: Follows one step commands inconsistently Safety/Judgement: Decreased awareness of safety;Decreased awareness of deficits Awareness: Intellectual Problem Solving: Decreased initiation;Requires verbal cues;Slow processing;Difficulty sequencing;Requires tactile cues General Comments: pt able to orient to herself with increased time, was talking to people in room not present. Speaking in mumbling/rambling about being in another place and cooking, etc. Difficulty maintaining attention to basic BADL tasks   General Comments       Exercises     Shoulder Instructions      Home Living Family/patient expects to be discharged to:: Skilled nursing facility                                        Prior Functioning/Environment Level of Independence: Needs assistance        Comments: unable to recall history, pt is very confused at eval        OT Problem List: Decreased strength;Decreased knowledge of use of DME or AE;Decreased activity tolerance;Decreased cognition;Cardiopulmonary status limiting activity;Impaired balance (sitting and/or standing);Decreased safety awareness      OT Treatment/Interventions: Self-care/ADL training;Therapeutic exercise;Patient/family education;Balance training;Energy conservation;Therapeutic activities;DME and/or AE instruction;Cognitive remediation/compensation    OT Goals(Current goals can be found in the care plan section) Acute Rehab OT Goals OT Goal Formulation: Patient unable to participate in goal setting Time For Goal Achievement: 09/23/19 Potential to Achieve Goals: Good  OT Frequency: Min 2X/week   Barriers to D/C:            Co-evaluation              AM-PAC OT "6 Clicks" Daily Activity     Outcome Measure Help from another person eating meals?: A Lot Help from another person taking care of personal grooming?: A Lot Help from another person toileting,  which includes using toliet, bedpan, or urinal?: Total Help from another person bathing (including washing, rinsing, drying)?: Total Help from another person to put on and taking off regular upper body clothing?: Total Help from another person to put on and taking off regular lower body clothing?: Total 6 Click Score: 8   End of Session Nurse Communication: Mobility status  Activity Tolerance: Treatment limited secondary to medical complications (Comment);Other (comment)(cognitive status) Patient left: in bed;with call bell/phone within reach;with bed alarm set  OT Visit Diagnosis: Unsteadiness on feet (R26.81);Other abnormalities of gait and mobility (R26.89);Muscle weakness (generalized) (M62.81);Other symptoms and signs involving cognitive function                Time: [id number] OT Time Calculation (min): 21 min Charges:  OT General Charges $OT Visit: 1 Visit OT Evaluation $OT Eval Moderate Complexity: [place], MSOT, OTR/L Acute Rehabilitation Services [facility] Office Number: [phone] Pager: [phone]  Zenovia Jarred 09/09/2019, 11:13 AM

## 2019-09-09 NOTE — TOC Progression Note (Signed)
Transition of Care [facility]) - Progression Note    Patient Details  Name: DYNISTY SAPIA MRN: [id number] Date of Birth: [birth year]  Transition of Care [facility]) CM/SW [place] Phone Number: 09/09/2019, 4:16 PM  Clinical Narrative:     CSW received message from [facility] stating patient's guardian and grandson has not completed required paperwork for admission still, and they have been unable to reach him despite multiple attempts.   CSW has also reached out to [place] (grandson) as well as Bradd Canary with [facility], vm's have been left but no answer at this time.  Paperwork must be completed prior to admission and [facility] reports they are unsure if staff will be avail to review admission paperwork on the weekend. CSW has also informed [name] of this in vm and reiterated importance of completing admission paperwork.    Expected Discharge Plan: [place] Barriers to Discharge: Continued Medical Work up  Expected Discharge Plan and Services Expected Discharge Plan: [place] arrangements for the past 2 months: [place]                                       Social Determinants of Health (SDOH) Interventions    Readmission Risk Interventions No flowsheet data found.

## 2019-09-10 DIAGNOSIS — J9601 Acute respiratory failure with hypoxia: Secondary | ICD-10-CM | POA: Diagnosis not present

## 2019-09-10 DIAGNOSIS — R578 Other shock: Secondary | ICD-10-CM | POA: Diagnosis not present

## 2019-09-10 DIAGNOSIS — G9341 Metabolic encephalopathy: Secondary | ICD-10-CM | POA: Diagnosis not present

## 2019-09-10 DIAGNOSIS — I4821 Permanent atrial fibrillation: Secondary | ICD-10-CM | POA: Diagnosis not present

## 2019-09-10 LAB — GLUCOSE, CAPILLARY
Glucose-Capillary: 100 mg/dL — ABNORMAL HIGH (ref 70–99)
Glucose-Capillary: 100 mg/dL — ABNORMAL HIGH (ref 70–99)
Glucose-Capillary: 106 mg/dL — ABNORMAL HIGH (ref 70–99)
Glucose-Capillary: 107 mg/dL — ABNORMAL HIGH (ref 70–99)
Glucose-Capillary: 135 mg/dL — ABNORMAL HIGH (ref 70–99)
Glucose-Capillary: 155 mg/dL — ABNORMAL HIGH (ref 70–99)

## 2019-09-10 LAB — COMPREHENSIVE METABOLIC PANEL
ALT: 66 U/L — ABNORMAL HIGH (ref 0–44)
AST: 54 U/L — ABNORMAL HIGH (ref 15–41)
Albumin: 2.8 g/dL — ABNORMAL LOW (ref 3.5–5.0)
Alkaline Phosphatase: 66 U/L (ref 38–126)
Anion gap: 11 (ref 5–15)
BUN: 14 mg/dL (ref 8–23)
CO2: 26 mmol/L (ref 22–32)
Calcium: 8.6 mg/dL — ABNORMAL LOW (ref 8.9–10.3)
Chloride: 108 mmol/L (ref 98–111)
Creatinine, Ser: 0.89 mg/dL (ref 0.44–1.00)
GFR calc Af Amer: >60 mL/min (ref >60)
GFR calc non Af Amer: 58 mL/min — ABNORMAL LOW (ref >60)
Glucose, Bld: 106 mg/dL — ABNORMAL HIGH (ref 70–99)
Potassium: 3.6 mmol/L (ref 3.5–5.1)
Sodium: 145 mmol/L (ref 135–145)
Total Bilirubin: 1.4 mg/dL — ABNORMAL HIGH (ref 0.3–1.2)
Total Protein: 5.5 g/dL — ABNORMAL LOW (ref 6.5–8.1)

## 2019-09-10 LAB — HEPATITIS PANEL, ACUTE
HCV Ab: NONREACTIVE
Hep A IgM: NONREACTIVE
Hep B C IgM: NONREACTIVE
Hepatitis B Surface Ag: NONREACTIVE

## 2019-09-10 LAB — MAGNESIUM: Magnesium: 1.8 mg/dL (ref 1.7–2.4)

## 2019-09-10 LAB — AMMONIA: Ammonia: 43 umol/L — ABNORMAL HIGH (ref 9–35)

## 2019-09-10 LAB — CK: Total CK: 65 U/L (ref 38–234)

## 2019-09-10 MED ORDER — FUROSEMIDE 10 MG/ML IJ SOLN
40.0000 mg | Freq: Two times a day (BID) | INTRAMUSCULAR | Status: DC
  Administered 2019-09-10 – 2019-09-12 (×5): 40 mg via INTRAVENOUS
  Filled 2019-09-10 (×5): qty 4

## 2019-09-10 MED ORDER — POTASSIUM CHLORIDE CRYS ER 20 MEQ PO TBCR
40.0000 meq | EXTENDED_RELEASE_TABLET | Freq: Once | ORAL | Status: AC
  Administered 2019-09-10: 09:00:00 40 meq via ORAL
  Filled 2019-09-10: qty 2

## 2019-09-10 MED ORDER — MAGNESIUM SULFATE 2 GM/50ML IV SOLN
2.0000 g | Freq: Once | INTRAVENOUS | Status: AC
  Administered 2019-09-10: 2 g via INTRAVENOUS
  Filled 2019-09-10: qty 50

## 2019-09-10 NOTE — Progress Notes (Signed)
   09/10/19 0000  Vitals  ECG Heart Rate (!) 120  MEWS Score  MEWS Temp 0  MEWS Systolic 0  MEWS Pulse 2  MEWS RR 0  MEWS LOC 0  MEWS Score 2  MEWS Score Color Yellow   No acute change. MD aware of high HR

## 2019-09-10 NOTE — TOC Progression Note (Signed)
Transition of Care [facility]) - Progression Note    Patient Details  Name: Tiffany Charles MRN: [id number] Date of Birth: 02-12-1930  Transition of Care [facility]) CM/SW [place], [place] Phone Number: [phone] 09/10/2019, 9:08 AM  Clinical Narrative:    CSW called [facility] to ascertain if paperwork had been completed. CSW had to leave message.  TOC team will continue to assist with discharge planning needs.   Expected Discharge Plan: [place] Barriers to Discharge: Continued Medical Work up  Expected Discharge Plan and Services Expected Discharge Plan: [place] arrangements for the past 2 months: [place]                                       Social Determinants of Health (SDOH) Interventions    Readmission Risk Interventions No flowsheet data found.

## 2019-09-10 NOTE — Progress Notes (Addendum)
MD aware of pts HR, came to see pt. Soap Suds enema done, with large amount black stool. Bladder san 175 ml.

## 2019-09-10 NOTE — Progress Notes (Signed)
PROGRESS NOTE  Tiffany Charles:[id number] DOB: May 20, 1930 DOA: 09/01/2019 PCP: [name], MD  Brief Narrative: 85 year old female with multiple complex comorbidities including permanent A. fib on Xarelto, COPD, CAD, hypertension, chronic diastolic CHF, CAD, hyperlipidemia, mild pulmonary hypertension, skin cancer, TR, recently admitted to [facility] for cellulitis presented to [facility] ED 4/8 after found altered at skilled nursing rehab and coffee-ground emesis at bedside.  In the ED was hypoxic and hypotensive and confused needing intubation.  Blood work showed INR more than 10, undetectable blood count, was given Kcentra in the ED and blood transfusion was initiated.  GI and cardiology was consulted.  Patient admitted to ICU, urine blood culture were sent, empirically placed on IV antibiotics.  CT abdomen pelvis on admission no acute GI bleed, large amount of stool at the level of patient's rectum, echo showed EF 35 to 40%, regional wall motion abnormalities, RV systolic function severely reduced, severely dilated left atria, right atria moderate MR mild to moderate TR. Urine culture with 5000 Enterococcus faecalis-, blood culture no growth to date. Patient was treated for acute respiratory failure, extubated 4/11, upper GI bleed Xarelto reversed with Kcentra needed for unit PRBC EGD deferred due to age/comorbidities, also A. fib with RVR, non-ST elevation MI/ischemic cardiomyopathy no further intervention per cardiology. Patient transferred to [facility] 09/06/2019    HPI/Recap of past 24 hours:  She is pleasantly confused,  She is only oriented to person, not a reliable historian   She is in [place] remains  Elevated,  Remain volume overloaded on exam   Assessment/Plan: Principal Problem:   Hemorrhagic shock [facility]) Active Problems:   Permanent atrial fibrillation   COPD - PFTs in June 2014 with MET test   CAD, RCA BMS '09, 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 low risk Myoview Feb  2013   Chronic anticoagulation- Xarelto   Goals of care, counseling/discussion   Chronic diastolic (congestive) heart failure (HCC)   Weakness   Mild pulmonary hypertension (HCC)   Occipital stroke (HCC)   Acute liver failure   Acute hypoxemic respiratory failure (HCC)   Upper GI bleed   Palliative care by specialist   Hemorrhagic shock 2/2 bleeding- Upper GI bleed - home meds Xarelto discontinued -reversed with Kcentra, 1 of cryo, platelets, s/p 4 units PRBC, seen by GI, EGD deferred due to age/comorbidities.  HB stable. On Protonix IV twice daily, off daily vitamin K  (inr 1.1), INR is stable  Acute hypoxemic respiratory failure/acute on chronic systolic chf exacerbation/bilateral pleural effusions/possible aspiration penumonia History of COPD - CXR 4/10 showed minimal vascular congestion   - Successful extubation 4/11 -No wheezing, lung sounds diminished, wean oxygen as tolerated -on unasyn for possible aspiration pneumonia, changed to augmentin on 4/16  to minimize volume -wbc remain elevated,  - repeat cxr on 4/16 "1. Increasing right pleural effusion and volume loss in the right Hemithorax. 2. Improving left pleural effusion. 3. Increasing vascular congestion with mild pulmonary edema." -she is started on iv lasix daily on 4/15, change to iv lasix bid on 4/17  Non-ST elevation MI/ischemic cardiomyopathy /acute on chronic systolic chf exacerbation  H/o CAD s/p BMS in 2009 trop in 15k->2593, echo showed EF 35 to 40% reduced with regional wall motion normalities, seen by cardiology and no further intervention per cardiology, cannot use aspirin On statin  ivf d/ced on 4/13 + 10liter with lower extremity edema, started on iv Lasix on 4/15, increased to lasix bid on 4/17  Afib/RVR Not a candidate  for anticoagulation Remains in A. fib /rvr,  increase lopressor, uptitrate as bp allows Keep K>4, mag>2 Treat reversible cause   Hypokalemia/hypomagnesemia,  Remain low , continue  to replace Repeat lab in am  AKI/metabolic acidosis ischemic ATN versus contrast nephropathy  renal function normalized.  Monitor  Transaminitis, likely shock liver, LFTs downtrending.  Monitor. Check hepatitis panel,  ck level wnl  Dysphagia On pureed diet/honey thick liquid on unasyn then augmentin for possible aspiration pneumonia  Acute Metabolic Encephalopathy she is confused, suspect multifactorial.cont supportive care.  PT OT evaluation.  Reorient and minimize sedatives H/o cva in 05/2019, suspect underline Dementia Continue lipitor  ammonia level 43  PVD/Chronic Left foot wound S/p Percutaneous transluminal angioplasty and stent placement left superficial femoral artery and popliteal on 8/93 Daily application of small foam dressing for left foot wound.  vagina MASD  Antifungal powder q 4 hours to vaginal folds  Poor prognosis, palliative care consulted, input appreciated   DVT Prophylaxis:scd  Code Status: DNR  Family Communication: patient , grandson (who is the POA) only per request  Disposition Plan:    Patient came from:                snf                                                                                          Anticipated d/c place: snf   Barriers to d/c OR conditions which need to be met to effect a safe d/c: tachycardic, leukocytosis, bilateral pleural effusions   Consultants:  Cardiology   LB Gi, signed off on 4/11  Wound care  Critical care  Procedures:  Intubation/extubation  Antibiotics: Flagyl 4/8  Cefepime 4/8 > 4/9 Vancomycin 4/8 unasyn from 4/13 to 4/16 then augmentin   Objective: BP (!) 144/94    Pulse (!) 112    Temp 97.6 F (36.4 C) (Oral)    Resp 18    Ht 5' 6"  (1.676 m)    Wt 62.3 kg    SpO2 92%    BMI 22.17 kg/m   Intake/Output Summary (Last 24 hours) at 09/10/2019 7342 Last data filed at 09/10/2019 0404 Gross per 24 hour  Intake 360 ml  Output 2450 ml  Net -2090 ml   Filed Weights   09/08/19 0411  09/09/19 0500 09/10/19 0158  Weight: 66.7 kg 66.1 kg 62.3 kg    Exam: Patient is examined daily including today on 09/10/2019, exams remain the same as of yesterday except that has changed    General: Very weak ,chronically ill, pleasantly confused  Cardiovascular: IRRR, tachycardia  Respiratory: diminished ,no wheezing appreciated   Abdomen: Soft/ND/NT, positive BS  Musculoskeletal: bilateral lower extremity pitting Edema, improving   Neuro: alert, oriented to person only  Data Reviewed: Basic Metabolic Panel: Recent Labs  Lab 09/04/19 0511 09/04/19 1358 09/05/19 0422 09/05/19 0422 09/06/19 0531 09/07/19 0455 09/08/19 0518 09/09/19 0443 09/10/19 0414  NA 139   < > 140   < > 135 137 139 143 145  K 3.2*   < > 3.8   < > 3.9 3.7 3.3* 3.1* 3.6  CL 111   < >  114*   < > 107 110 109 108 108  CO2 18*   < > 19*   < > 18* 17* 20* 23 26  GLUCOSE 148*   < > 136*   < > 141* 112* 84 98 106*  BUN 54*   < > 43*   < > 31* 20 16 15 14   CREATININE 1.85*   < > 1.37*   < > 1.16* 0.89 0.85 0.91 0.89  CALCIUM 7.7*   < > 7.9*   < > 8.1* 8.1* 8.2* 8.5* 8.6*  MG 1.8  --  2.3  --  2.1  --   --  1.6* 1.8  PHOS 3.5  --  2.9  --  2.3* 2.0*  --  2.5  --    < > = values in this interval not displayed.   Liver Function Tests: Recent Labs  Lab 09/06/19 0531 09/07/19 0455 09/08/19 0518 09/09/19 0443 09/10/19 0414  AST 80* 56* 50* 52* 54*  ALT 143* 95* 78* 67* 66*  ALKPHOS 64 69 64 66 66  BILITOT 1.6* 1.3* 1.2 1.6* 1.4*  PROT 5.6* 5.1* 5.0* 5.3* 5.5*  ALBUMIN 2.5* 2.3* 2.3* 2.6* 2.8*   No results for input(s): LIPASE, AMYLASE in the last 168 hours. Recent Labs  Lab 09/10/19 0414  AMMONIA 43*   CBC: Recent Labs  Lab 09/05/19 0422 09/06/19 0531 09/07/19 0455 09/08/19 0518 09/09/19 0443  WBC 12.4* 14.9* 13.7* 14.3* 14.0*  NEUTROABS  --   --   --   --  11.1*  HGB 13.2 14.3 13.9 14.8 14.8  HCT 39.6 43.4 43.6 45.6 45.7  MCV 88.0 87.9 88.1 88.4 87.9  PLT 149* 146* 148* 146* 158    Cardiac Enzymes:   Recent Labs  Lab 09/10/19 0414  CKTOTAL 65   BNP (last 3 results) Recent Labs    06/05/19 1352  BNP 194.0*    ProBNP (last 3 results) No results for input(s): PROBNP in the last 8760 hours.  CBG: Recent Labs  Lab 09/09/19 1100 09/09/19 1604 09/09/19 2001 09/10/19 0010 09/10/19 0359  GLUCAP 106* 147* 107* 107* 106*    Recent Results (from the past 240 hour(s))  Blood culture (routine x 2)     Status: None   Collection Time: 09/01/19  7:25 PM   Specimen: BLOOD  Result Value Ref Range Status   Specimen Description BLOOD LEFT UPPER  Final   Special Requests   Final    BOTTLES DRAWN AEROBIC AND ANAEROBIC Blood Culture results may not be optimal due to an excessive volume of blood received in culture bottles   Culture   Final    NO GROWTH 5 DAYS Performed at [facility], [place] [address] [place], [place] [address]    Report Status 09/06/2019 FINAL  Final  Urine culture     Status: Abnormal   Collection Time: 09/01/19  7:30 PM   Specimen: In/Out Cath Urine  Result Value Ref Range Status   Specimen Description IN/OUT CATH URINE  Final   Special Requests   Final    NONE Performed at [facility], [place] [address], [place], [place] [address]    Culture 5,000 COLONIES/mL ENTEROCOCCUS FAECALIS (A)  Final   Report Status 09/04/2019 FINAL  Final   Organism ID, Bacteria ENTEROCOCCUS FAECALIS (A)  Final      Susceptibility   Enterococcus faecalis - MIC*    AMPICILLIN <=2 SENSITIVE Sensitive     NITROFURANTOIN <=16 SENSITIVE  Sensitive     VANCOMYCIN 1 SENSITIVE Sensitive     * 5,000 COLONIES/mL ENTEROCOCCUS FAECALIS  Respiratory Panel by RT PCR (Flu A&B, Covid) - Nasopharyngeal Swab     Status: None   Collection Time: 09/01/19  8:12 PM   Specimen: Nasopharyngeal Swab  Result Value Ref Range Status   SARS Coronavirus 2 by RT PCR NEGATIVE NEGATIVE Final    Comment: (NOTE) SARS-CoV-2 target nucleic acids are NOT DETECTED. The SARS-CoV-2  RNA is generally detectable in upper respiratoy specimens during the acute phase of infection. The lowest concentration of SARS-CoV-2 viral copies this assay can detect is 131 copies/mL. A negative result does not preclude SARS-Cov-2 infection and should not be used as the sole basis for treatment or other patient management decisions. A negative result may occur with  improper specimen collection/handling, submission of specimen other than nasopharyngeal swab, presence of viral mutation(s) within the areas targeted by this assay, and inadequate number of viral copies (<131 copies/mL). A negative result must be combined with clinical observations, patient history, and epidemiological information. The expected result is Negative. Fact Sheet for Patients:  PinkCheek.be Fact Sheet for Healthcare Providers:  GravelBags.it This test is not yet ap proved or cleared by the [place] FDA and  has been authorized for detection and/or diagnosis of SARS-CoV-2 by FDA under an Emergency Use Authorization (EUA). This EUA will remain  in effect (meaning this test can be used) for the duration of the COVID-19 declaration under Section 564(b)(1) of the Act, 21 U.S.C. section 360bbb-3(b)(1), unless the authorization is terminated or revoked sooner.    Influenza A by PCR NEGATIVE NEGATIVE Final   Influenza B by PCR NEGATIVE NEGATIVE Final    Comment: (NOTE) The Xpert Xpress SARS-CoV-2/FLU/RSV assay is intended as an aid in  the diagnosis of influenza from Nasopharyngeal swab specimens and  should not be used as a sole basis for treatment. Nasal washings and  aspirates are unacceptable for Xpert Xpress SARS-CoV-2/FLU/RSV  testing. Fact Sheet for Patients: PinkCheek.be Fact Sheet for Healthcare Providers: GravelBags.it This test is not yet approved or cleared by the [place] FDA  and  has been authorized for detection and/or diagnosis of SARS-CoV-2 by  FDA under an Emergency Use Authorization (EUA). This EUA will remain  in effect (meaning this test can be used) for the duration of the  Covid-19 declaration under Section 564(b)(1) of the Act, 21  U.S.C. section 360bbb-3(b)(1), unless the authorization is  terminated or revoked. Performed at [facility], [place] [address], [place], [place] [address]   MRSA PCR Screening     Status: None   Collection Time: 09/01/19 11:30 PM   Specimen: Nasal Mucosa; Nasopharyngeal  Result Value Ref Range Status   MRSA by PCR NEGATIVE NEGATIVE Final    Comment:        The GeneXpert MRSA Assay (FDA approved for NASAL specimens only), is one component of a comprehensive MRSA colonization surveillance program. It is not intended to diagnose MRSA infection nor to guide or monitor treatment for MRSA infections. Performed at [facility], [place] [address], [place], [place] [address]   Blood culture (routine x 2)     Status: None   Collection Time: 09/01/19 11:57 PM   Specimen: BLOOD  Result Value Ref Range Status   Specimen Description BLOOD CENTRAL LINE  Final   Special Requests   Final    BOTTLES DRAWN AEROBIC ONLY Blood Culture adequate volume Performed at [facility]  Lab, [address], [place], [place] [address]    Culture NO GROWTH 5 DAYS  Final   Report Status 09/07/2019 FINAL  Final  Urine Culture     Status: None   Collection Time: 09/08/19  4:37 PM   Specimen: Urine, Catheterized  Result Value Ref Range Status   Specimen Description URINE, CATHETERIZED  Final   Special Requests NONE  Final   Culture   Final    NO GROWTH Performed at [facility], [place] [address] [place], [place] [address]    Report Status 09/09/2019 FINAL  Final  SARS CORONAVIRUS 2 (TAT 6-24 HRS) Nasopharyngeal Nasopharyngeal Swab     Status: None   Collection Time: 09/08/19  5:27 PM   Specimen: Nasopharyngeal Swab   Result Value Ref Range Status   SARS Coronavirus 2 NEGATIVE NEGATIVE Final    Comment: (NOTE) SARS-CoV-2 target nucleic acids are NOT DETECTED. The SARS-CoV-2 RNA is generally detectable in upper and lower respiratory specimens during the acute phase of infection. Negative results do not preclude SARS-CoV-2 infection, do not rule out co-infections with other pathogens, and should not be used as the sole basis for treatment or other patient management decisions. Negative results must be combined with clinical observations, patient history, and epidemiological information. The expected result is Negative. Fact Sheet for Patients: SugarRoll.be Fact Sheet for Healthcare Providers: https://www.woods-mathews.com/ This test is not yet approved or cleared by the [place] FDA and  has been authorized for detection and/or diagnosis of SARS-CoV-2 by FDA under an Emergency Use Authorization (EUA). This EUA will remain  in effect (meaning this test can be used) for the duration of the COVID-19 declaration under Section 56 4(b)(1) of the Act, 21 U.S.C. section 360bbb-3(b)(1), unless the authorization is terminated or revoked sooner. Performed at [facility], [place] [address] [place], [place] [address]      Studies: DG Chest 2 View  Result Date: 09/09/2019 CLINICAL DATA:  Cough. Fall. Hematemesis. EXAM: CHEST - 2 VIEW COMPARISON:  Radiograph 09/06/2019 FINDINGS: Increasing right pleural effusion and volume loss in the right hemithorax. Left pleural effusion with some interval improvement. Unchanged heart size and mediastinal contours. Increasing vascular congestion with mild pulmonary edema. No pneumothorax. Incidental gaseous gastric distention in the upper abdomen. Bones are diffusely under mineralized. IMPRESSION: 1. Increasing right pleural effusion and volume loss in the right hemithorax. 2. Improving left pleural effusion. 3. Increasing  vascular congestion with mild pulmonary edema. Electronically Signed   By: Keith Rake M.D.   On: 09/09/2019 17:02    Scheduled Meds:  amoxicillin-clavulanate  500 mg Oral BID   atorvastatin  20 mg Oral q1800   chlorhexidine  15 mL Mouth Rinse BID   feeding supplement (ENSURE ENLIVE)  237 mL Oral BID BM   folic acid  1 mg Oral Daily   furosemide  40 mg Intravenous BID   mouth rinse  15 mL Mouth Rinse q12n4p   metoprolol tartrate  37.5 mg Oral BID   multivitamin with minerals  1 tablet Oral Daily   pantoprazole (PROTONIX) IV  40 mg Intravenous Q12H   potassium chloride  40 mEq Oral Once   senna-docusate  1 tablet Oral BID   tamsulosin  0.4 mg Oral Daily    Continuous Infusions:  sodium chloride 10 mL/hr at 09/08/19 2300   magnesium sulfate bolus IVPB       Time spent: 18mns I have personally reviewed and interpreted on  09/10/2019 daily labs, tele strips,  imagings as discussed above under date review session and assessment and plans.  I reviewed all nursing notes, pharmacy notes, consultant notes,  vitals, pertinent old records  I have discussed plan of care as described above with RN , patient  on 09/10/2019   [name] MD, PhD, FACP  [facility]  Available via Epic secure chat 7am-7pm for nonurgent issues Please page for urgent issues, pager number available through [email or web address] .   09/10/2019, 8:08 AM  LOS: 9 days

## 2019-09-10 NOTE — TOC Progression Note (Addendum)
Transition of Care [facility]) - Progression Note    Patient Details  Name: Tiffany Charles MRN: [id number] Date of Birth: [birth year]  Transition of [facility]) CM/SW [place], LCSW Phone Number: [phone] 09/10/2019, 9:52 AM  Clinical Narrative:     CSW received a call back from Tiffany Charles at [facility] stating that client's authorization had been denied and nephew never came to fill out paperwork. Tiffany Charles gave CSW appeal information:  Phone number: [phone] ext. [phone] and needs to be completed by Tuesday at [place] alerted MD XU about appeal.  CSW will follow up with grandson Tiffany Charles as well. CSW had to leave a message.  TOC team will continue to follow for discharge planning needs.    Expected Discharge Plan: [place] Barriers to Discharge: Continued Medical Work up  Expected Discharge Plan and Services Expected Discharge Plan: [place] arrangements for the past 2 months: [place]                                       Social Determinants of Health (SDOH) Interventions    Readmission Risk Interventions No flowsheet data found.

## 2019-09-11 DIAGNOSIS — J9601 Acute respiratory failure with hypoxia: Secondary | ICD-10-CM | POA: Diagnosis not present

## 2019-09-11 DIAGNOSIS — R578 Other shock: Secondary | ICD-10-CM | POA: Diagnosis not present

## 2019-09-11 DIAGNOSIS — I4821 Permanent atrial fibrillation: Secondary | ICD-10-CM | POA: Diagnosis not present

## 2019-09-11 DIAGNOSIS — G9341 Metabolic encephalopathy: Secondary | ICD-10-CM | POA: Diagnosis not present

## 2019-09-11 LAB — COMPREHENSIVE METABOLIC PANEL
ALT: 49 U/L — ABNORMAL HIGH (ref 0–44)
AST: 49 U/L — ABNORMAL HIGH (ref 15–41)
Albumin: 2.7 g/dL — ABNORMAL LOW (ref 3.5–5.0)
Alkaline Phosphatase: 60 U/L (ref 38–126)
Anion gap: 9 (ref 5–15)
BUN: 15 mg/dL (ref 8–23)
CO2: 28 mmol/L (ref 22–32)
Calcium: 8.3 mg/dL — ABNORMAL LOW (ref 8.9–10.3)
Chloride: 105 mmol/L (ref 98–111)
Creatinine, Ser: 0.84 mg/dL (ref 0.44–1.00)
GFR calc Af Amer: >60 mL/min (ref >60)
GFR calc non Af Amer: >60 mL/min (ref >60)
Glucose, Bld: 108 mg/dL — ABNORMAL HIGH (ref 70–99)
Potassium: 3.7 mmol/L (ref 3.5–5.1)
Sodium: 142 mmol/L (ref 135–145)
Total Bilirubin: 1.3 mg/dL — ABNORMAL HIGH (ref 0.3–1.2)
Total Protein: 5.4 g/dL — ABNORMAL LOW (ref 6.5–8.1)

## 2019-09-11 LAB — AMMONIA: Ammonia: 53 umol/L — ABNORMAL HIGH (ref 9–35)

## 2019-09-11 LAB — GLUCOSE, CAPILLARY
Glucose-Capillary: 108 mg/dL — ABNORMAL HIGH (ref 70–99)
Glucose-Capillary: 114 mg/dL — ABNORMAL HIGH (ref 70–99)
Glucose-Capillary: 129 mg/dL — ABNORMAL HIGH (ref 70–99)
Glucose-Capillary: 150 mg/dL — ABNORMAL HIGH (ref 70–99)
Glucose-Capillary: 91 mg/dL (ref 70–99)
Glucose-Capillary: 93 mg/dL (ref 70–99)

## 2019-09-11 LAB — CBC WITH DIFFERENTIAL/PLATELET
Abs Immature Granulocytes: 0.08 10*3/uL — ABNORMAL HIGH (ref 0.00–0.07)
Basophils Absolute: 0.1 10*3/uL (ref 0.0–0.1)
Basophils Relative: 0 %
Eosinophils Absolute: 0.3 10*3/uL (ref 0.0–0.5)
Eosinophils Relative: 2 %
HCT: 44.1 % (ref 36.0–46.0)
Hemoglobin: 14.5 g/dL (ref 12.0–15.0)
Immature Granulocytes: 1 %
Lymphocytes Relative: 17 %
Lymphs Abs: 2 10*3/uL (ref 0.7–4.0)
MCH: 28.7 pg (ref 26.0–34.0)
MCHC: 32.9 g/dL (ref 30.0–36.0)
MCV: 87.2 fL (ref 80.0–100.0)
Monocytes Absolute: 0.8 10*3/uL (ref 0.1–1.0)
Monocytes Relative: 7 %
Neutro Abs: 8.9 10*3/uL — ABNORMAL HIGH (ref 1.7–7.7)
Neutrophils Relative %: 73 %
Platelets: 187 10*3/uL (ref 150–400)
RBC: 5.06 MIL/uL (ref 3.87–5.11)
RDW: 17.5 % — ABNORMAL HIGH (ref 11.5–15.5)
WBC: 12.1 10*3/uL — ABNORMAL HIGH (ref 4.0–10.5)
nRBC: 0 % (ref 0.0–0.2)

## 2019-09-11 LAB — SARS CORONAVIRUS 2 (TAT 6-24 HRS): SARS Coronavirus 2: NEGATIVE

## 2019-09-11 LAB — MAGNESIUM: Magnesium: 1.6 mg/dL — ABNORMAL LOW (ref 1.7–2.4)

## 2019-09-11 MED ORDER — MAGNESIUM SULFATE 4 GM/100ML IV SOLN
4.0000 g | Freq: Once | INTRAVENOUS | Status: AC
  Administered 2019-09-11: 4 g via INTRAVENOUS
  Filled 2019-09-11: qty 100

## 2019-09-11 MED ORDER — LACTULOSE 10 GM/15ML PO SOLN
10.0000 g | Freq: Two times a day (BID) | ORAL | Status: DC
  Administered 2019-09-11 – 2019-09-14 (×8): 10 g via ORAL
  Filled 2019-09-11 (×9): qty 15

## 2019-09-11 MED ORDER — METOPROLOL TARTRATE 50 MG PO TABS
50.0000 mg | ORAL_TABLET | Freq: Two times a day (BID) | ORAL | Status: DC
  Administered 2019-09-11 – 2019-09-27 (×32): 50 mg via ORAL
  Filled 2019-09-11 (×33): qty 1

## 2019-09-11 MED ORDER — POTASSIUM CHLORIDE CRYS ER 20 MEQ PO TBCR: 40.0000 meq | EXTENDED_RELEASE_TABLET | Freq: Once | ORAL | Status: DC

## 2019-09-11 MED ORDER — POTASSIUM CHLORIDE 20 MEQ PO PACK
40.0000 meq | PACK | Freq: Once | ORAL | Status: AC
  Administered 2019-09-11: 11:00:00 40 meq via ORAL
  Filled 2019-09-11: qty 2

## 2019-09-11 NOTE — Progress Notes (Addendum)
PROGRESS NOTE  Tiffany Charles GFQ:[id number] DOB: 30-Sep-1929 DOA: 09/01/2019 PCP: [name], MD  Brief Narrative: 86 year old female with multiple complex comorbidities including permanent A. fib on Xarelto, COPD, CAD, hypertension, chronic diastolic CHF, CAD, hyperlipidemia, mild pulmonary hypertension, skin cancer, TR, recently admitted to [facility] for cellulitis presented to [facility] ED 4/8 after found altered at skilled nursing rehab and coffee-ground emesis at bedside.  In the ED was hypoxic and hypotensive and confused needing intubation.  Blood work showed INR more than 10, undetectable blood count, was given Kcentra in the ED and blood transfusion was initiated.  GI and cardiology was consulted.  Patient admitted to ICU, urine blood culture were sent, empirically placed on IV antibiotics.  CT abdomen pelvis on admission no acute GI bleed, large amount of stool at the level of patient's rectum, echo showed EF 35 to 40%, regional wall motion abnormalities, RV systolic function severely reduced, severely dilated left atria, right atria moderate MR mild to moderate TR. Urine culture with 5000 Enterococcus faecalis-, blood culture no growth to date. Patient was treated for acute respiratory failure, extubated 4/11, upper GI bleed Xarelto reversed with Kcentra needed for unit PRBC EGD deferred due to age/comorbidities, also A. fib with RVR, non-ST elevation MI/ischemic cardiomyopathy no further intervention per cardiology. Patient transferred to [facility] 09/06/2019    HPI/Recap of past 24 hours:  She is pleasantly confused, son at bedside She is only oriented to person,    She is in afib/rvr, heart rate is improving She  does not appear to be symptomatic Wbc  Elevated,  But appear start to trend down Remain volume overloaded on exam but improving, 2.7liter urine output last 24hrs   Assessment/Plan: Principal Problem:   Hemorrhagic shock ([place]) Active Problems:   Permanent atrial fibrillation  COPD - PFTs in June 2014 with MET test   CAD, RCA BMS '09, 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 low risk Myoview Feb 2013   Chronic anticoagulation- Xarelto   Goals of care, counseling/discussion   Chronic diastolic (congestive) heart failure (HCC)   Weakness   Mild pulmonary hypertension (HCC)   Occipital stroke (HCC)   Acute liver failure   Acute hypoxemic respiratory failure (HCC)   Upper GI bleed   Palliative care by specialist   Hemorrhagic shock 2/2 bleeding- Upper GI bleed (presenting symptom) - home meds Xarelto discontinued -reversed with Kcentra, 1 of cryo, platelets, s/p 4 units PRBC, seen by GI, EGD deferred due to age/comorbidities.  HB stable. On Protonix IV twice daily, off daily vitamin K  (inr 1.1), INR is stable, hgb stable at 14   Acute hypoxemic respiratory failure/acute on chronic systolic chf exacerbation/bilateral pleural effusions/possible aspiration penumonia History of COPD - - Successful extubation 4/11, she is now  Weaned off oxygen on room air at rest -No wheezing, lung sounds diminished,  -on unasyn for possible aspiration pneumonia, changed to augmentin on 4/16  to minimize volume, last day abx on 4/19 to finish total of 7 days treatment -wbc remain elevated, but start to trend down - repeat cxr on 4/16 "1. Increasing right pleural effusion and volume loss in the right Hemithorax. 2. Improving left pleural effusion. 3. Increasing vascular congestion with mild pulmonary edema." -she is started on iv lasix daily on 4/15, change to iv lasix bid on 4/17, volume status improving, but not euvolemic yet  Non-ST elevation MI/ischemic cardiomyopathy /acute on chronic systolic chf exacerbation  H/o CAD s/p BMS in 2009 trop in 15k->2593, echo showed  EF 35 to 40% reduced with regional wall motion normalities, seen by cardiology and no further intervention per cardiology, cannot use aspirin On statin  ivf d/ced on 4/13 + 10liter on 4/14 /4/15 with lower  extremity edema, she is started on iv Lasix on 4/15, increased to lasix bid on 4/17, improving  Afib/RVR Not a candidate for anticoagulation Remains in A. fib /rvr, but less tachycardia increase lopressor to 58m bid on 4/18 Keep K>4, mag>2 Treat reversible cause   Hypokalemia/hypomagnesemia,  Remain low , continue to replace Repeat lab in am  AKI/metabolic acidosis ischemic ATN versus contrast nephropathy  renal function normalized.  Monitor  Transaminitis, likely shock liver initially, now possible liver congestion, LFTs downtrending.  Monitor. hepatitis panel negative ck level wnl  Dysphagia On pureed diet/honey thick liquid on unasyn then augmentin for possible aspiration pneumonia Aspiration precaution  Acute Metabolic Encephalopathy she is confused, suspect multifactorial.cont supportive care.  PT OT evaluation.  Reorient and minimize sedatives H/o cva in 05/2019, son confirmed  Dementia diagnosis 6-8 months ago Continue lipitor  ammonia level elevated, start lactulose  PVD/Chronic Left foot wound S/p Percutaneous transluminal angioplasty and stent placement left superficial femoral artery and popliteal on 31/10Daily application of small foam dressing for left foot wound.  vagina MASD  Antifungal powder q 4 hours to vaginal folds  Constipation, improved after enemax2, now on lactulose  Poor prognosis, palliative care consulted, input appreciated , recommend outpatient palliative care continue to follow patient.  DVT Prophylaxis:scd  Code Status: DNR  Family Communication: patient , grandson (who is the POA) only per request., called grandson on 4/18, left message   Disposition Plan:    Patient came from:                snf                                                                                          Anticipated d/c place: snf   Barriers to d/c OR conditions which need to be met to effect a safe d/c: clinically improving, less tachycardic, less  volume overload, wbc trending down, likely able to d/c in 24-48hrs to snf, repeat covid screening ordered.   Consultants:  Cardiology   [facility], signed off on 4/11  Wound care  Critical care  Procedures:  Intubation/extubation  prbc transfusion   Antibiotics: Flagyl 4/8  Cefepime 4/8 > 4/9 Vancomycin 4/8 unasyn from 4/13 to 4/16 then augmentin, end date on 4/19   Objective: BP (!) 136/101 (BP Location: Right Arm)   Pulse (!) 104   Temp 97.6 F (36.4 C) (Oral)   Resp 18   Ht 5' 6"  (1.676 m)   Wt 58.5 kg   SpO2 98%   BMI 20.82 kg/m   Intake/Output Summary (Last 24 hours) at 09/11/2019 1000 Last data filed at 09/11/2019 0900 Gross per 24 hour  Intake 480 ml  Output 2551 ml  Net -2071 ml   Filed Weights   09/09/19 0500 09/10/19 0158 09/11/19 0046  Weight: 66.1 kg 62.3 kg 58.5 kg    Exam: Patient is examined daily including today on 09/11/2019,  exams remain the same as of yesterday except that has changed    General: Very weak ,chronically ill, pleasantly confused  Cardiovascular: IRRR, less tachycardia  Respiratory: diminished ,no wheezing appreciated   Abdomen: Soft/ND/NT, positive BS  Musculoskeletal: bilateral lower extremity pitting Edema, improving   Neuro: alert, oriented to person only  Data Reviewed: Basic Metabolic Panel: Recent Labs  Lab 09/05/19 0422 09/05/19 0422 09/06/19 0531 09/06/19 0531 09/07/19 0455 09/08/19 0518 09/09/19 0443 09/10/19 0414 09/11/19 0315  NA 140   < > 135   < > 137 139 143 145 142  K 3.8   < > 3.9   < > 3.7 3.3* 3.1* 3.6 3.7  CL 114*   < > 107   < > 110 109 108 108 105  CO2 19*   < > 18*   < > 17* 20* 23 26 28   GLUCOSE 136*   < > 141*   < > 112* 84 98 106* 108*  BUN 43*   < > 31*   < > 20 16 15 14 15   CREATININE 1.37*   < > 1.16*   < > 0.89 0.85 0.91 0.89 0.84  CALCIUM 7.9*   < > 8.1*   < > 8.1* 8.2* 8.5* 8.6* 8.3*  MG 2.3  --  2.1  --   --   --  1.6* 1.8 1.6*  PHOS 2.9  --  2.3*  --  2.0*  --  2.5  --    --    < > = values in this interval not displayed.   Liver Function Tests: Recent Labs  Lab 09/07/19 0455 09/08/19 0518 09/09/19 0443 09/10/19 0414 09/11/19 0315  AST 56* 50* 52* 54* 49*  ALT 95* 78* 67* 66* 49*  ALKPHOS 69 64 66 66 60  BILITOT 1.3* 1.2 1.6* 1.4* 1.3*  PROT 5.1* 5.0* 5.3* 5.5* 5.4*  ALBUMIN 2.3* 2.3* 2.6* 2.8* 2.7*   No results for input(s): LIPASE, AMYLASE in the last 168 hours. Recent Labs  Lab 09/10/19 0414 09/11/19 0315  AMMONIA 43* 53*   CBC: Recent Labs  Lab 09/06/19 0531 09/07/19 0455 09/08/19 0518 09/09/19 0443 09/11/19 0315  WBC 14.9* 13.7* 14.3* 14.0* 12.1*  NEUTROABS  --   --   --  11.1* 8.9*  HGB 14.3 13.9 14.8 14.8 14.5  HCT 43.4 43.6 45.6 45.7 44.1  MCV 87.9 88.1 88.4 87.9 87.2  PLT 146* 148* 146* 158 187   Cardiac Enzymes:   Recent Labs  Lab 09/10/19 0414  CKTOTAL 65   BNP (last 3 results) Recent Labs    06/05/19 1352  BNP 194.0*    ProBNP (last 3 results) No results for input(s): PROBNP in the last 8760 hours.  CBG: Recent Labs  Lab 09/10/19 1609 09/10/19 1950 09/11/19 0000 09/11/19 0401 09/11/19 0849  GLUCAP 155* 135* 91 108* 93    Recent Results (from the past 240 hour(s))  Blood culture (routine x 2)     Status: None   Collection Time: 09/01/19  7:25 PM   Specimen: BLOOD  Result Value Ref Range Status   Specimen Description BLOOD LEFT UPPER  Final   Special Requests   Final    BOTTLES DRAWN AEROBIC AND ANAEROBIC Blood Culture results may not be optimal due to an excessive volume of blood received in culture bottles   Culture   Final    NO GROWTH 5 DAYS Performed at [facility], [place] [address], [place], [place] [address]  Report Status 09/06/2019 FINAL  Final  Urine culture     Status: Abnormal   Collection Time: 09/01/19  7:30 PM   Specimen: In/Out Cath Urine  Result Value Ref Range Status   Specimen Description IN/OUT CATH URINE  Final   Special Requests   Final    NONE Performed at  [facility], [place] [address] [place], [place] [address]    Culture 5,000 COLONIES/mL ENTEROCOCCUS FAECALIS (A)  Final   Report Status 09/04/2019 FINAL  Final   Organism ID, Bacteria ENTEROCOCCUS FAECALIS (A)  Final      Susceptibility   Enterococcus faecalis - MIC*    AMPICILLIN <=2 SENSITIVE Sensitive     NITROFURANTOIN <=16 SENSITIVE Sensitive     VANCOMYCIN 1 SENSITIVE Sensitive     * 5,000 COLONIES/mL ENTEROCOCCUS FAECALIS  Respiratory Panel by RT PCR (Flu A&B, Covid) - Nasopharyngeal Swab     Status: None   Collection Time: 09/01/19  8:12 PM   Specimen: Nasopharyngeal Swab  Result Value Ref Range Status   SARS Coronavirus 2 by RT PCR NEGATIVE NEGATIVE Final    Comment: (NOTE) SARS-CoV-2 target nucleic acids are NOT DETECTED. The SARS-CoV-2 RNA is generally detectable in upper respiratoy specimens during the acute phase of infection. The lowest concentration of SARS-CoV-2 viral copies this assay can detect is 131 copies/mL. A negative result does not preclude SARS-Cov-2 infection and should not be used as the sole basis for treatment or other patient management decisions. A negative result may occur with  improper specimen collection/handling, submission of specimen other than nasopharyngeal swab, presence of viral mutation(s) within the areas targeted by this assay, and inadequate number of viral copies (<131 copies/mL). A negative result must be combined with clinical observations, patient history, and epidemiological information. The expected result is Negative. Fact Sheet for Patients:  PinkCheek.be Fact Sheet for Healthcare Providers:  GravelBags.it This test is not yet ap proved or cleared by the [place] FDA and  has been authorized for detection and/or diagnosis of SARS-CoV-2 by FDA under an Emergency Use Authorization (EUA). This EUA will remain  in effect (meaning this test can be used) for the  duration of the COVID-19 declaration under Section 564(b)(1) of the Act, 21 U.S.C. section 360bbb-3(b)(1), unless the authorization is terminated or revoked sooner.    Influenza A by PCR NEGATIVE NEGATIVE Final   Influenza B by PCR NEGATIVE NEGATIVE Final    Comment: (NOTE) The Xpert Xpress SARS-CoV-2/FLU/RSV assay is intended as an aid in  the diagnosis of influenza from Nasopharyngeal swab specimens and  should not be used as a sole basis for treatment. Nasal washings and  aspirates are unacceptable for Xpert Xpress SARS-CoV-2/FLU/RSV  testing. Fact Sheet for Patients: PinkCheek.be Fact Sheet for Healthcare Providers: GravelBags.it This test is not yet approved or cleared by the [place] FDA and  has been authorized for detection and/or diagnosis of SARS-CoV-2 by  FDA under an Emergency Use Authorization (EUA). This EUA will remain  in effect (meaning this test can be used) for the duration of the  Covid-19 declaration under Section 564(b)(1) of the Act, 21  U.S.C. section 360bbb-3(b)(1), unless the authorization is  terminated or revoked. Performed at [facility], [place] [address] [place], [place] [address]   MRSA PCR Screening     Status: None   Collection Time: 09/01/19 11:30 PM   Specimen: Nasal Mucosa; Nasopharyngeal  Result Value Ref Range Status   MRSA by PCR NEGATIVE NEGATIVE Final  Comment:        The GeneXpert MRSA Assay (FDA approved for NASAL specimens only), is one component of a comprehensive MRSA colonization surveillance program. It is not intended to diagnose MRSA infection nor to guide or monitor treatment for MRSA infections. Performed at [facility], [place] [address], [place], [place] [address]   Blood culture (routine x 2)     Status: None   Collection Time: 09/01/19 11:57 PM   Specimen: BLOOD  Result Value Ref Range Status   Specimen Description BLOOD CENTRAL LINE  Final     Special Requests   Final    BOTTLES DRAWN AEROBIC ONLY Blood Culture adequate volume Performed at [facility], [place] [address], [place], [place] [address]    Culture NO GROWTH 5 DAYS  Final   Report Status 09/07/2019 FINAL  Final  Urine Culture     Status: None   Collection Time: 09/08/19  4:37 PM   Specimen: Urine, Catheterized  Result Value Ref Range Status   Specimen Description URINE, CATHETERIZED  Final   Special Requests NONE  Final   Culture   Final    NO GROWTH Performed at [facility], [place] [address] [place], [place] [address]    Report Status 09/09/2019 FINAL  Final  SARS CORONAVIRUS 2 (TAT 6-24 HRS) Nasopharyngeal Nasopharyngeal Swab     Status: None   Collection Time: 09/08/19  5:27 PM   Specimen: Nasopharyngeal Swab  Result Value Ref Range Status   SARS Coronavirus 2 NEGATIVE NEGATIVE Final    Comment: (NOTE) SARS-CoV-2 target nucleic acids are NOT DETECTED. The SARS-CoV-2 RNA is generally detectable in upper and lower respiratory specimens during the acute phase of infection. Negative results do not preclude SARS-CoV-2 infection, do not rule out co-infections with other pathogens, and should not be used as the sole basis for treatment or other patient management decisions. Negative results must be combined with clinical observations, patient history, and epidemiological information. The expected result is Negative. Fact Sheet for Patients: SugarRoll.be Fact Sheet for Healthcare Providers: https://www.woods-mathews.com/ This test is not yet approved or cleared by the [place] FDA and  has been authorized for detection and/or diagnosis of SARS-CoV-2 by FDA under an Emergency Use Authorization (EUA). This EUA will remain  in effect (meaning this test can be used) for the duration of the COVID-19 declaration under Section 56 4(b)(1) of the Act, 21 U.S.C. section 360bbb-3(b)(1), unless the authorization is  terminated or revoked sooner. Performed at [facility], [place] [address] [place], [place] [address]      Studies: No results found.  Scheduled Meds: . amoxicillin-clavulanate  500 mg Oral BID  . atorvastatin  20 mg Oral q1800  . chlorhexidine  15 mL Mouth Rinse BID  . feeding supplement (ENSURE ENLIVE)  237 mL Oral BID BM  . folic acid  1 mg Oral Daily  . furosemide  40 mg Intravenous BID  . lactulose  10 g Oral BID  . mouth rinse  15 mL Mouth Rinse q12n4p  . metoprolol tartrate  50 mg Oral BID  . multivitamin with minerals  1 tablet Oral Daily  . pantoprazole (PROTONIX) IV  40 mg Intravenous Q12H  . potassium chloride  40 mEq Oral Once  . senna-docusate  1 tablet Oral BID  . tamsulosin  0.4 mg Oral Daily    Continuous Infusions: . sodium chloride 10 mL/hr at 09/08/19 2300  . magnesium sulfate bolus IVPB  Time spent: 54mns I have personally reviewed and interpreted on  09/11/2019 daily labs, tele strips, imagings as discussed above under date review session and assessment and plans.  I reviewed all nursing notes, pharmacy notes, consultant notes,  vitals, pertinent old records  I have discussed plan of care as described above with RN , patient  on 09/11/2019   FFlorencia ReasonsMD, PhD, FACP  [facility]  Available via Epic secure chat 7am-7pm for nonurgent issues Please page for urgent issues, pager number available through [employer] .   09/11/2019, 10:00 AM  LOS: 10 days

## 2019-09-11 NOTE — Progress Notes (Signed)
   09/11/19 0405  Vitals  Temp 97.6 F (36.4 C)  Temp Source Oral  BP (!) 144/98  MAP (mmHg) 110  BP Location Right Arm  BP Method Automatic  Patient Position (if appropriate) Lying  Pulse Rate (!) 114  Pulse Rate Source Monitor  ECG Heart Rate (!) 114  Resp 18  Oxygen Therapy  SpO2 97 %  O2 Device Room Air  MEWS Score  MEWS Temp 0  MEWS Systolic 0  MEWS Pulse 2  MEWS RR 0  MEWS LOC 0  MEWS Score 2  MEWS Score Color Yellow   HR not an acute change. MD aware of pt HR.

## 2019-09-12 ENCOUNTER — Encounter (INDEPENDENT_AMBULATORY_CARE_PROVIDER_SITE_OTHER): Payer: Medicare HMO

## 2019-09-12 ENCOUNTER — Ambulatory Visit (INDEPENDENT_AMBULATORY_CARE_PROVIDER_SITE_OTHER): Payer: Medicare HMO | Admitting: Vascular Surgery

## 2019-09-12 DIAGNOSIS — R578 Other shock: Secondary | ICD-10-CM | POA: Diagnosis not present

## 2019-09-12 LAB — CBC WITH DIFFERENTIAL/PLATELET
Abs Immature Granulocytes: 0.08 10*3/uL — ABNORMAL HIGH (ref 0.00–0.07)
Basophils Absolute: 0.1 10*3/uL (ref 0.0–0.1)
Basophils Relative: 1 %
Eosinophils Absolute: 0.2 10*3/uL (ref 0.0–0.5)
Eosinophils Relative: 2 %
HCT: 44.8 % (ref 36.0–46.0)
Hemoglobin: 14.5 g/dL (ref 12.0–15.0)
Immature Granulocytes: 1 %
Lymphocytes Relative: 18 %
Lymphs Abs: 2 10*3/uL (ref 0.7–4.0)
MCH: 29 pg (ref 26.0–34.0)
MCHC: 32.4 g/dL (ref 30.0–36.0)
MCV: 89.6 fL (ref 80.0–100.0)
Monocytes Absolute: 0.7 10*3/uL (ref 0.1–1.0)
Monocytes Relative: 6 %
Neutro Abs: 8.1 10*3/uL — ABNORMAL HIGH (ref 1.7–7.7)
Neutrophils Relative %: 72 %
Platelets: 188 10*3/uL (ref 150–400)
RBC: 5 MIL/uL (ref 3.87–5.11)
RDW: 17.4 % — ABNORMAL HIGH (ref 11.5–15.5)
WBC: 11.2 10*3/uL — ABNORMAL HIGH (ref 4.0–10.5)
nRBC: 0 % (ref 0.0–0.2)

## 2019-09-12 LAB — COMPREHENSIVE METABOLIC PANEL
ALT: 42 U/L (ref 0–44)
AST: 44 U/L — ABNORMAL HIGH (ref 15–41)
Albumin: 2.5 g/dL — ABNORMAL LOW (ref 3.5–5.0)
Alkaline Phosphatase: 58 U/L (ref 38–126)
Anion gap: 12 (ref 5–15)
BUN: 15 mg/dL (ref 8–23)
CO2: 30 mmol/L (ref 22–32)
Calcium: 8.4 mg/dL — ABNORMAL LOW (ref 8.9–10.3)
Chloride: 100 mmol/L (ref 98–111)
Creatinine, Ser: 0.85 mg/dL (ref 0.44–1.00)
GFR calc Af Amer: >60 mL/min (ref >60)
GFR calc non Af Amer: >60 mL/min (ref >60)
Glucose, Bld: 100 mg/dL — ABNORMAL HIGH (ref 70–99)
Potassium: 3.6 mmol/L (ref 3.5–5.1)
Sodium: 142 mmol/L (ref 135–145)
Total Bilirubin: 1.3 mg/dL — ABNORMAL HIGH (ref 0.3–1.2)
Total Protein: 5.1 g/dL — ABNORMAL LOW (ref 6.5–8.1)

## 2019-09-12 LAB — GLUCOSE, CAPILLARY
Glucose-Capillary: 111 mg/dL — ABNORMAL HIGH (ref 70–99)
Glucose-Capillary: 112 mg/dL — ABNORMAL HIGH (ref 70–99)
Glucose-Capillary: 113 mg/dL — ABNORMAL HIGH (ref 70–99)
Glucose-Capillary: 113 mg/dL — ABNORMAL HIGH (ref 70–99)
Glucose-Capillary: 97 mg/dL (ref 70–99)
Glucose-Capillary: 98 mg/dL (ref 70–99)

## 2019-09-12 LAB — MAGNESIUM: Magnesium: 2 mg/dL (ref 1.7–2.4)

## 2019-09-12 LAB — AMMONIA: Ammonia: 40 umol/L — ABNORMAL HIGH (ref 9–35)

## 2019-09-12 MED ORDER — PANTOPRAZOLE SODIUM 40 MG PO TBEC
40.0000 mg | DELAYED_RELEASE_TABLET | Freq: Two times a day (BID) | ORAL | Status: DC
  Administered 2019-09-12 – 2019-09-27 (×29): 40 mg via ORAL
  Filled 2019-09-12 (×31): qty 1

## 2019-09-12 MED ORDER — FUROSEMIDE 40 MG PO TABS
40.0000 mg | ORAL_TABLET | Freq: Every day | ORAL | Status: DC
  Administered 2019-09-13 – 2019-09-27 (×15): 40 mg via ORAL
  Filled 2019-09-12 (×16): qty 1

## 2019-09-12 NOTE — Progress Notes (Signed)
  Speech Language Pathology Treatment: Dysphagia  Patient Details Name: Tiffany Charles MRN: [id number] DOB: [birth year] Today's Date: 09/12/2019 Time: [id number] SLP Time Calculation (min) (ACUTE ONLY): 31 min  Assessment / Plan / Recommendation Clinical Impression  Pt today asleep upon SLP entrance to room.  She awoke easily to NT conducting blood glucose test.  SLP provided oral care with toothbrush/paste and removed pt's right mitt.  She fed herself puree/nectar and thin liquids.  Intake was approx 40% of breakfast largely sausage, eggs, juice and milk.  She demonstrated subtle dry cough x2 during entire meal over approx 30 minutes.  Cough noted after nectar liquid x1 and thin x1.  At this time, given pt's reliance on others for feeding, increased asp risk with thin and adequate po observed consumed today, recommend continue nectar liquids and puree diet.   Pt does admit to h/o GERD and coughing with po at times. No family present at this time but SLP updated swallow precaution signs to encourage pt self feeding for proprioception/safety/autonomy.    HPI HPI: 86 year old female with PMH as below, which is significant for Atrial fibrillation on Xarelto, COPD, CAD, and HTN. She was recently admitted to [facility] and treated for cellulitis with keflex and doxycycline. She presented to [facility] ED 4/8 hypoxemic and hypotensive, after being found altered at SNF with coffee-ground emesis beside her bed. Intubated 4/8-4/11.      SLP Plan  Continue with current plan of care       Recommendations  Diet recommendations: Dysphagia 1 (puree);Nectar-thick liquid Liquids provided via: Straw Medication Administration: Crushed with puree Supervision: Staff to assist with self feeding Compensations: Slow rate;Small sips/bites Postural Changes and/or Swallow Maneuvers: Seated upright 90 degrees;Upright 30-60 min after meal                Oral Care Recommendations: Oral care BID Follow up  Recommendations: Skilled Nursing facility SLP Visit Diagnosis: Dysphagia, unspecified (R13.10) Plan: Continue with current plan of care       GO                [name] 09/12/2019, 8:19 AM  [name], MS [place] Office [phone]

## 2019-09-12 NOTE — Plan of Care (Signed)
  Problem: Education: Goal: Knowledge of General Education information will improve Description: Including pain rating scale, medication(s)/side effects and non-pharmacologic comfort measures Outcome: Progressing   Problem: Health Behavior/Discharge Planning: Goal: Ability to manage health-related needs will improve Outcome: Progressing   Problem: Clinical Measurements: Goal: Ability to maintain clinical measurements within normal limits will improve Outcome: Progressing Goal: Will remain free from infection Outcome: Progressing Goal: Diagnostic test results will improve Outcome: Progressing Goal: Respiratory complications will improve Outcome: Progressing Goal: Cardiovascular complication will be avoided Outcome: Progressing   Problem: Activity: Goal: Risk for activity intolerance will decrease Outcome: Progressing   Problem: Nutrition: Goal: Adequate nutrition will be maintained Outcome: Progressing   Problem: Coping: Goal: Level of anxiety will decrease Outcome: Progressing   Problem: Elimination: Goal: Will not experience complications related to bowel motility Outcome: Progressing Goal: Will not experience complications related to urinary retention Outcome: Progressing   Problem: Pain Managment: Goal: General experience of comfort will improve Outcome: Progressing   Problem: Safety: Goal: Ability to remain free from injury will improve Outcome: Progressing   Problem: Skin Integrity: Goal: Risk for impaired skin integrity will decrease Outcome: Progressing   Problem: Activity: Goal: Ability to tolerate increased activity will improve Outcome: Progressing   Problem: Respiratory: Goal: Ability to maintain a clear airway and adequate ventilation will improve Outcome: Progressing   Problem: Role Relationship: Goal: Method of communication will improve Outcome: Progressing   Problem: Education: Goal: Knowledge of disease or condition will  improve Outcome: Progressing Goal: Understanding of medication regimen will improve Outcome: Progressing Goal: Individualized Educational Video(s) Outcome: Progressing   Problem: Activity: Goal: Ability to tolerate increased activity will improve Outcome: Progressing   Problem: Cardiac: Goal: Ability to achieve and maintain adequate cardiopulmonary perfusion will improve Outcome: Progressing   Problem: Health Behavior/Discharge Planning: Goal: Ability to safely manage health-related needs after discharge will improve Outcome: Progressing

## 2019-09-12 NOTE — Progress Notes (Signed)
PROGRESS NOTE    Tiffany Charles  PXT:[id number] DOB: [birth year] DOA: 09/01/2019 PCP: [name], MD   Brief Narrative:  Patient is 86 year old female with history of permanent A. fib on Xarelto, COPD, coronary disease, hypertension, chronic diastolic CHF, hyperlipidemia, pulmonary hypertension, skin cancer who presented to the emergency department with complaints of altered mental status, coffee-ground emesis at skilled nursing facility.  Presentation, she was hypoxic, hypotensive, confused needing intubation for airway protection.  INR was more than 10.  CBC showed hemoglobin of 5.5.  She was given Kcentra and blood transfusion.   GI consulted.CT abdomen pelvis on admission no acute GI bleed, large amount of stool at the level of patient's rectum, Patient was admitted under PCCM service.  Patient was extubated on 4/11. EGD deferred due to advanced age/comorbidities. Now the plan is to place her on SNF, pending authorization.  Assessment & Plan:   Principal Problem:   Hemorrhagic shock [facility]) Active Problems:   Permanent atrial fibrillation   COPD - PFTs in June 2014 with MET test   CAD, RCA BMS '09, 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 low risk Myoview Feb 2013   Chronic anticoagulation- Xarelto   Goals of care, counseling/discussion   Chronic diastolic (congestive) heart failure (HCC)   Weakness   Mild pulmonary hypertension (HCC)   Occipital stroke (HCC)   Acute liver failure   Acute hypoxemic respiratory failure ([place])   Upper GI bleed   Palliative care by specialist   Hemorrhagic shock: Secondary to upper GI bleed.  She was on Xarelto at home.  Reversed with Kcentra, platelets transfusion, status post 4 units of PRBC.  GI was following.  EGD deferred due to age/comorbidities.  On Protonix.  Acute hypoxic respiratory failure: On presentation, she was intubated.  Successful extubation on 4/11.  Currently on room air.  She was also treated with Unasyn for possible  aspiration pneumonia, changed to Augmentin ,finishing  antibiotics today.  NSTEMI/ischemic cardiomyopathy/acute on chronic systolic CHF exacerbation: Troponin was elevated on presentation.  Echocardiogram showed EF 35 to 40%, regional wall motion abnormalities, RV systolic function severely reduced, severely dilated left atria, right atria moderate MR mild to moderate TR. neurology was consulted, no intervention planned.  Not on any blood thinners.  On statin. Due to persistent bilateral lower extremity wound, she has been started on Lasix IV.  Continue daily weight/input output monitoring.  A. fib with RVR: Not a candidate for  anticoagulation.  Increased Lopressor to 50 mg twice a day.  Monitor on telemetry.  AKI: Resolved  Transaminitis: Most likely secondary to shock liver on presentation versus liver congestion due to CHF.  LFTs trended down.  Hepatitis panel negative.  Dysphagia: Dysphagia 1 diet.  Speech therapy following.  She was treated with Unasyn for aspiration pneumonia.  Acute metabolic encephalopathy: Confused.  Multifactorial cause.  Continue supportive care.  History of CVA .  History of dementia diagnosed about 6 to 8 months ago. Also has mild elevated ammonia level.  Started on lactulose.  Peripheral vascular disease/chronic left foot wound:S/p Percutaneous transluminal angioplasty and stent placementleftsuperficial femoral artery and popliteal on 6/27 Daily application of small foam dressing for left foot wound  Constipation: Currently on lactulose.  Goals of care: DNR/DNI.  Palliative care was consulted.  Recommend outpatient follow-up with palliative care.  PT/OT evaluations performed. SNF recommended. SNF appropriate as the patient has received 3 days of hospital care (or the 3 day period has been waived by the patient's insurance company) and  is felt to need rehab services to restore this patient to their prior level of function to achieve safe transition back to home  care. This patient needs rehab services for at least 5 days per week and skilled nursing services daily to facilitate this transition. Rehab is being requested as the most appropriate d/c option for this patient and is NOT felt to be for custodial care as evidenced by improvement in her mobility with physical therapy.  Nutrition Problem: Inadequate oral intake Etiology: inability to eat      DVT prophylaxis:SCD Code Status: DNR Family Communication: None Status is: Inpatient  Remains inpatient appropriate because:Unsafe d/c plan   Dispo: The patient is from: SNF              Anticipated d/c is to: SNF              Anticipated d/c date is: 1 day              Patient currently is medically stable to d/c.         Consultants: Cardiology, GI, PCCM  Procedures: Intubation/extubation  Antimicrobials:  Anti-infectives (From admission, onward)   Start     Dose/Rate Route Frequency Ordered Stop   09/09/19 1800  amoxicillin-clavulanate (AUGMENTIN) 500-125 MG per tablet 500 mg     500 mg Oral 2 times daily 09/09/19 1353 09/13/19 0759   09/06/19 1430  Ampicillin-Sulbactam (UNASYN) 3 g in sodium chloride 0.9 % 100 mL IVPB  Status:  Discontinued     3 g 200 mL/hr over 30 Minutes Intravenous Every 8 hours 09/06/19 1340 09/09/19 1353   09/02/19 2030  ceFEPIme (MAXIPIME) 2 g in sodium chloride 0.9 % 100 mL IVPB  Status:  Discontinued     2 g 200 mL/hr over 30 Minutes Intravenous Every 24 hours 09/01/19 2046 09/03/19 0900   09/01/19 2043  vancomycin variable dose per unstable renal function (pharmacist dosing)  Status:  Discontinued      Does not apply See admin instructions 09/01/19 2046 09/03/19 0900   09/01/19 1945  ceFEPIme (MAXIPIME) 2 g in sodium chloride 0.9 % 100 mL IVPB     2 g 200 mL/hr over 30 Minutes Intravenous  Once 09/01/19 1930 09/01/19 2051   09/01/19 1945  metroNIDAZOLE (FLAGYL) IVPB 500 mg     500 mg 100 mL/hr over 60 Minutes Intravenous  Once 09/01/19 1930 09/02/19  0129   09/01/19 1945  vancomycin (VANCOCIN) IVPB 1000 mg/200 mL premix     1,000 mg 200 mL/hr over 60 Minutes Intravenous  Once 09/01/19 1930 09/02/19 0129      Subjective:  Patient seen and examined at the bedside this morning.  Hemodynamically stable.  Lying on bed, weak.  Alert and awake but not fully oriented.  Denies any complaints.  Heart rate in the range of 110s.  Objective: Vitals:   09/11/19 2300 09/12/19 0030 09/12/19 0133 09/12/19 0315  BP:  126/82  128/76  Pulse: 86 94  91  Resp:  19  18  Temp:  97.8 F (36.6 C)  (!) 97.5 F (36.4 C)  TempSrc:  Oral  Oral  SpO2:  96%  96%  Weight:   56 kg   Height:        Intake/Output Summary (Last 24 hours) at 09/12/2019 0829 Last data filed at 09/12/2019 0032 Gross per 24 hour  Intake 520 ml  Output 1500 ml  Net -980 ml   Filed Weights   09/10/19 0158 09/11/19 0046  09/12/19 0133  Weight: 62.3 kg 58.5 kg 56 kg    Examination:  General exam: Generalized weakness, elderly, pleasantly confused Respiratory system:  no wheezes or crackles  Cardiovascular system: Afib. No JVD, murmurs, rubs, gallops or clicks. No pedal edema. Gastrointestinal system: Abdomen is nondistended, soft and nontender. No organomegaly or masses felt. Normal bowel sounds heard. Central nervous system: Alert and awake, oriented to self.  Obeys commands  extremities: No edema, no clubbing ,no cyanosis Skin: Scattered Ecchymosis     Data Reviewed: I have personally reviewed following labs and imaging studies  CBC: Recent Labs  Lab 09/07/19 0455 09/08/19 0518 09/09/19 0443 09/11/19 0315 09/12/19 0510  WBC 13.7* 14.3* 14.0* 12.1* 11.2*  NEUTROABS  --   --  11.1* 8.9* 8.1*  HGB 13.9 14.8 14.8 14.5 14.5  HCT 43.6 45.6 45.7 44.1 44.8  MCV 88.1 88.4 87.9 87.2 89.6  PLT 148* 146* 158 187 383   Basic Metabolic Panel: Recent Labs  Lab 09/06/19 0531 09/06/19 0531 09/07/19 0455 09/07/19 0455 09/08/19 0518 09/09/19 0443 09/10/19 0414  09/11/19 0315 09/12/19 0510  NA 135   < > 137   < > 139 143 145 142 142  K 3.9   < > 3.7   < > 3.3* 3.1* 3.6 3.7 3.6  CL 107   < > 110   < > 109 108 108 105 100  CO2 18*   < > 17*   < > 20* 23 26 28 30   GLUCOSE 141*   < > 112*   < > 84 98 106* 108* 100*  BUN 31*   < > 20   < > 16 15 14 15 15   CREATININE 1.16*   < > 0.89   < > 0.85 0.91 0.89 0.84 0.85  CALCIUM 8.1*   < > 8.1*   < > 8.2* 8.5* 8.6* 8.3* 8.4*  MG 2.1  --   --   --   --  1.6* 1.8 1.6* 2.0  PHOS 2.3*  --  2.0*  --   --  2.5  --   --   --    < > = values in this interval not displayed.   GFR: Estimated Creatinine Clearance: 41.2 mL/min (by C-G formula based on SCr of 0.85 mg/dL). Liver Function Tests: Recent Labs  Lab 09/08/19 0518 09/09/19 0443 09/10/19 0414 09/11/19 0315 09/12/19 0510  AST 50* 52* 54* 49* 44*  ALT 78* 67* 66* 49* 42  ALKPHOS 64 66 66 60 58  BILITOT 1.2 1.6* 1.4* 1.3* 1.3*  PROT 5.0* 5.3* 5.5* 5.4* 5.1*  ALBUMIN 2.3* 2.6* 2.8* 2.7* 2.5*   No results for input(s): LIPASE, AMYLASE in the last 168 hours. Recent Labs  Lab 09/10/19 0414 09/11/19 0315 09/12/19 0510  AMMONIA 43* 53* 40*   Coagulation Profile: Recent Labs  Lab 09/06/19 0531  INR 1.1   Cardiac Enzymes: Recent Labs  Lab 09/10/19 0414  CKTOTAL 65   BNP (last 3 results) No results for input(s): PROBNP in the last 8760 hours. HbA1C: No results for input(s): HGBA1C in the last 72 hours. CBG: Recent Labs  Lab 09/11/19 1636 09/11/19 2014 09/12/19 0025 09/12/19 0402 09/12/19 0738  GLUCAP 150* 129* 97 98 113*   Lipid Profile: No results for input(s): CHOL, HDL, LDLCALC, TRIG, CHOLHDL, LDLDIRECT in the last 72 hours. Thyroid Function Tests: No results for input(s): TSH, T4TOTAL, FREET4, T3FREE, THYROIDAB in the last 72 hours. Anemia Panel: No results for input(s): VITAMINB12, FOLATE,  FERRITIN, TIBC, IRON, RETICCTPCT in the last 72 hours. Sepsis Labs: Recent Labs  Lab 09/06/19 1311 09/07/19 0455 09/08/19 0518    PROCALCITON 0.15 0.13 <0.10    Recent Results (from the past 240 hour(s))  Urine Culture     Status: None   Collection Time: 09/08/19  4:37 PM   Specimen: Urine, Catheterized  Result Value Ref Range Status   Specimen Description URINE, CATHETERIZED  Final   Special Requests NONE  Final   Culture   Final    NO GROWTH Performed at [facility], [address], [place], [place] [address]    Report Status 09/09/2019 FINAL  Final  SARS CORONAVIRUS 2 (TAT 6-24 HRS) Nasopharyngeal Nasopharyngeal Swab     Status: None   Collection Time: 09/08/19  5:27 PM   Specimen: Nasopharyngeal Swab  Result Value Ref Range Status   SARS Coronavirus 2 NEGATIVE NEGATIVE Final    Comment: (NOTE) SARS-CoV-2 target nucleic acids are NOT DETECTED. The SARS-CoV-2 RNA is generally detectable in upper and lower respiratory specimens during the acute phase of infection. Negative results do not preclude SARS-CoV-2 infection, do not rule out co-infections with other pathogens, and should not be used as the sole basis for treatment or other patient management decisions. Negative results must be combined with clinical observations, patient history, and epidemiological information. The expected result is Negative. Fact Sheet for Patients: SugarRoll.be Fact Sheet for Healthcare Providers: https://www.woods-mathews.com/ This test is not yet approved or cleared by the [place] FDA and  has been authorized for detection and/or diagnosis of SARS-CoV-2 by FDA under an Emergency Use Authorization (EUA). This EUA will remain  in effect (meaning this test can be used) for the duration of the COVID-19 declaration under Section 56 4(b)(1) of the Act, 21 U.S.C. section 360bbb-3(b)(1), unless the authorization is terminated or revoked sooner. Performed at [facility], [place] [address], [place], [place] [address]   SARS CORONAVIRUS 2 (TAT 6-24 HRS) Nasopharyngeal  Nasopharyngeal Swab     Status: None   Collection Time: 09/11/19  3:58 PM   Specimen: Nasopharyngeal Swab  Result Value Ref Range Status   SARS Coronavirus 2 NEGATIVE NEGATIVE Final    Comment: (NOTE) SARS-CoV-2 target nucleic acids are NOT DETECTED. The SARS-CoV-2 RNA is generally detectable in upper and lower respiratory specimens during the acute phase of infection. Negative results do not preclude SARS-CoV-2 infection, do not rule out co-infections with other pathogens, and should not be used as the sole basis for treatment or other patient management decisions. Negative results must be combined with clinical observations, patient history, and epidemiological information. The expected result is Negative. Fact Sheet for Patients: SugarRoll.be Fact Sheet for Healthcare Providers: https://www.woods-mathews.com/ This test is not yet approved or cleared by the [place] FDA and  has been authorized for detection and/or diagnosis of SARS-CoV-2 by FDA under an Emergency Use Authorization (EUA). This EUA will remain  in effect (meaning this test can be used) for the duration of the COVID-19 declaration under Section 56 4(b)(1) of the Act, 21 U.S.C. section 360bbb-3(b)(1), unless the authorization is terminated or revoked sooner. Performed at [facility], [place] [address], [place],  [address]          Radiology Studies: No results found.      Scheduled Meds: . amoxicillin-clavulanate  500 mg Oral BID  . atorvastatin  20 mg Oral q1800  . chlorhexidine  15 mL Mouth Rinse BID  . feeding supplement (ENSURE  ENLIVE)  237 mL Oral BID BM  . folic acid  1 mg Oral Daily  . furosemide  40 mg Intravenous BID  . lactulose  10 g Oral BID  . mouth rinse  15 mL Mouth Rinse q12n4p  . metoprolol tartrate  50 mg Oral BID  . multivitamin with minerals  1 tablet Oral Daily  . pantoprazole (PROTONIX) IV  40 mg Intravenous Q12H  .  senna-docusate  1 tablet Oral BID  . tamsulosin  0.4 mg Oral Daily   Continuous Infusions: . sodium chloride 10 mL/hr at 09/08/19 2300     LOS: 11 days    Time spent: 102mns. More than 50% of that time was spent in counseling and/or coordination of care.      AShelly Coss MD [facility] P4/19/2021, 8:29 AM

## 2019-09-12 NOTE — TOC Progression Note (Signed)
Transition of Care [facility]) - Progression Note    Patient Details  Name: Tiffany Charles MRN: [id number] Date of Birth: Apr 15, 1930  Transition of Care [facility]) CM/SW [place], [place] Phone Number: 09/12/2019, 9:40 AM  Clinical Narrative:     CSW spoke with patient's son [name] regarding insurance denial and his decision moving forward, [name] is in agreement with appeal process to occur and CSW has reached out to Dr. Tawanna Solo to call [facility] for peer to peer process.   Phone number: [phone] ext. [phone] and needs to be completed by Tuesday at [place] reports he has been traveling on the road for work and has not checked his emails, but will complete [facility] admission paperwork this evening.   Expected Discharge Plan: [place] Barriers to Discharge: Continued Medical Work up  Expected Discharge Plan and Services Expected Discharge Plan: [place] arrangements for the past 2 months: [place]                                       Social Determinants of Health (SDOH) Interventions    Readmission Risk Interventions No flowsheet data found.

## 2019-09-13 DIAGNOSIS — R578 Other shock: Secondary | ICD-10-CM | POA: Diagnosis not present

## 2019-09-13 LAB — GLUCOSE, CAPILLARY
Glucose-Capillary: 155 mg/dL — ABNORMAL HIGH (ref 70–99)
Glucose-Capillary: 108 mg/dL — ABNORMAL HIGH (ref 70–99)
Glucose-Capillary: 86 mg/dL (ref 70–99)
Glucose-Capillary: 115 mg/dL — ABNORMAL HIGH (ref 70–99)
Glucose-Capillary: 71 mg/dL (ref 70–99)
Glucose-Capillary: 144 mg/dL — ABNORMAL HIGH (ref 70–99)

## 2019-09-13 NOTE — TOC Progression Note (Signed)
Transition of Care [facility]) - Progression Note    Patient Details  Name: Tiffany Charles MRN: [id number] Date of Birth: May 21, 1930  Transition of Care [facility]) CM/SW [place], [place] Phone Number: 09/13/2019, 1:59 PM  Clinical Narrative:     CSW contacted patient's insurance - she is regular [employer], the SNF completed the authorization.   CSW contacted SNF informed MD has completed the peer to peer and was informed SNF placement was approved. SNF states they are still waiting on official authorization, SNF will informed CSW once authorization is received.   Thurmond Butts, MSW, [place] Clinical Social Worker   Expected Discharge Plan: Skilled Nursing Facility Barriers to Discharge: Continued Medical Work up  Expected Discharge Plan and Services Expected Discharge Plan: [place] arrangements for the past 2 months: [place]                                       Social Determinants of Health (SDOH) Interventions    Readmission Risk Interventions No flowsheet data found.

## 2019-09-13 NOTE — Progress Notes (Signed)
PROGRESS NOTE    Tiffany Charles  POE:[id number] DOB: 1929/08/11 DOA: 09/01/2019 PCP: [name], MD   Brief Narrative:  Patient is 86 year old female with history of permanent A. fib on Xarelto, COPD, coronary disease, hypertension, chronic diastolic CHF, hyperlipidemia, pulmonary hypertension, skin cancer who presented to the emergency department with complaints of altered mental status, coffee-ground emesis at skilled nursing facility.  Presentation, she was hypoxic, hypotensive, confused needing intubation for airway protection.  INR was more than 10.  CBC showed hemoglobin of 5.5.  She was given Kcentra and blood transfusion.   GI consulted.CT abdomen pelvis on admission no acute GI bleed, large amount of stool at the level of patient's rectum, Patient was admitted under PCCM service.  Patient was extubated on 4/11. EGD deferred due to advanced age/comorbidities. Now the plan is to place her on SNF.  Assessment & Plan:   Principal Problem:   Hemorrhagic shock [facility]) Active Problems:   Permanent atrial fibrillation   COPD - PFTs in June 2014 with MET test   CAD, RCA BMS '09, 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 low risk Myoview Feb 2013   Chronic anticoagulation- Xarelto   Goals of care, counseling/discussion   Chronic diastolic (congestive) heart failure (HCC)   Weakness   Mild pulmonary hypertension (HCC)   Occipital stroke (HCC)   Acute liver failure   Acute hypoxemic respiratory failure ([place])   Upper GI bleed   Palliative care by specialist   Hemorrhagic shock: Secondary to upper GI bleed.  She was on Xarelto at home.  Reversed with Kcentra, platelets transfusion, status post 4 units of PRBC.  GI was following.  EGD deferred due to age/comorbidities.  On Protonix.  Acute hypoxic respiratory failure: On presentation, she was intubated.  Successful extubation on 4/11.  Currently on room air.  She was also treated with Unasyn for possible aspiration pneumonia, changed to  Augmentin ,finishing  antibiotics today.  NSTEMI/ischemic cardiomyopathy/acute on chronic systolic CHF exacerbation: Troponin was elevated on presentation.  Echocardiogram showed EF 35 to 40%, regional wall motion abnormalities, RV systolic function severely reduced, severely dilated left atria, right atria moderate MR mild to moderate TR. neurology was consulted, no intervention planned.  Not on any blood thinners.  On statin. Continue lasix 40 mg daily.  A. fib with RVR: Not a candidate for  Anticoagulation.On Lopressor to 50 mg twice a day.  Monitor on telemetry.HR well controlled.  AKI: Resolved  Transaminitis: Most likely secondary to shock liver on presentation versus liver congestion due to CHF.  LFTs trended down.  Hepatitis panel negative.  Dysphagia: Dysphagia 1 diet.  Speech therapy following.  She was treated with Unasyn for aspiration pneumonia.  Acute metabolic encephalopathy: Confused.  Multifactorial cause.  Continue supportive care.  History of CVA .  History of dementia diagnosed about 6 to 8 months ago. Also has mild elevated ammonia level.  Started on lactulose. She is alert and awake today.  Obeys commands  Peripheral vascular disease/chronic left foot wound:S/p Percutaneous transluminal angioplasty and stent placementleftsuperficial femoral artery and popliteal on 3/15 Daily application of small foam dressing for left foot wound  Constipation: Currently on lactulose.  Goals of care: DNR/DNI.  Palliative care was consulted.  Recommend outpatient follow-up with palliative care.  PT/OT evaluations performed. SNF recommended. SNF appropriate as the patient has received 3 days of hospital care (or the 3 day period has been waived by the patient's insurance company) and is felt to need rehab services to restore  this patient to their prior level of function to achieve safe transition back to home care. This patient needs rehab services for at least 5 days per week and skilled  nursing services daily to facilitate this transition. Rehab is being requested as the most appropriate d/c option for this patient and is NOT felt to be for custodial care as evidenced by improvement in her mobility with physical therapy.  Nutrition Problem: Inadequate oral intake Etiology: inability to eat      DVT prophylaxis:SCD Code Status: DNR Family Communication: Discussed with son on phone today Status is: Inpatient  Remains inpatient appropriate because:Unsafe d/c plan   Dispo: The patient is from: SNF              Anticipated d/c is to: SNF              Anticipated d/c date is: 1 day              Patient currently is medically stable to d/c.  Waiting for placement.Did peer to peer review today  with insurance company, authorization received.   Consultants: Cardiology, GI, PCCM  Procedures: Intubation/extubation  Antimicrobials:  Anti-infectives (From admission, onward)   Start     Dose/Rate Route Frequency Ordered Stop   09/09/19 1800  amoxicillin-clavulanate (AUGMENTIN) 500-125 MG per tablet 500 mg     500 mg Oral 2 times daily 09/09/19 1353 09/12/19 2003   09/06/19 1430  Ampicillin-Sulbactam (UNASYN) 3 g in sodium chloride 0.9 % 100 mL IVPB  Status:  Discontinued     3 g 200 mL/hr over 30 Minutes Intravenous Every 8 hours 09/06/19 1340 09/09/19 1353   09/02/19 2030  ceFEPIme (MAXIPIME) 2 g in sodium chloride 0.9 % 100 mL IVPB  Status:  Discontinued     2 g 200 mL/hr over 30 Minutes Intravenous Every 24 hours 09/01/19 2046 09/03/19 0900   09/01/19 2043  vancomycin variable dose per unstable renal function (pharmacist dosing)  Status:  Discontinued      Does not apply See admin instructions 09/01/19 2046 09/03/19 0900   09/01/19 1945  ceFEPIme (MAXIPIME) 2 g in sodium chloride 0.9 % 100 mL IVPB     2 g 200 mL/hr over 30 Minutes Intravenous  Once 09/01/19 1930 09/01/19 2051   09/01/19 1945  metroNIDAZOLE (FLAGYL) IVPB 500 mg     500 mg 100 mL/hr over 60 Minutes  Intravenous  Once 09/01/19 1930 09/02/19 0129   09/01/19 1945  vancomycin (VANCOCIN) IVPB 1000 mg/200 mL premix     1,000 mg 200 mL/hr over 60 Minutes Intravenous  Once 09/01/19 1930 09/02/19 0129      Subjective:  Patient seen and examined at the bedside this morning.  Hemodynamically stable.  Alert and awake but not oriented.  Comfortable.  Objective: Vitals:   09/12/19 1302 09/12/19 2022 09/13/19 0443 09/13/19 0447  BP: 109/77 109/73  119/79  Pulse: 94 100  92  Resp: 16 17  18   Temp: 97.6 F (36.4 C) 98 F (36.7 C)  98.2 F (36.8 C)  TempSrc: Oral Oral  Oral  SpO2:  98%  95%  Weight:   54.3 kg   Height:        Intake/Output Summary (Last 24 hours) at 09/13/2019 0742 Last data filed at 09/13/2019 0400 Gross per 24 hour  Intake 360 ml  Output 1200 ml  Net -840 ml   Filed Weights   09/11/19 0046 09/12/19 0133 09/13/19 0443  Weight: 58.5 kg 56 kg  54.3 kg    Examination:  General exam: Pleasantly confused elderly female.  Comfortable Respiratory system: no wheezes or crackles  Cardiovascular system: Irregularly irregular rhythm. No JVD, murmurs, rubs, gallops or clicks. Gastrointestinal system: Abdomen is nondistended, soft and nontender. No organomegaly or masses felt. Normal bowel sounds heard. Central nervous system: Alert and awake .oriented to self, obeys commands  extremities: No edema, no clubbing ,no cyanosis Skin: Scattered ecchymosis     Data Reviewed: I have personally reviewed following labs and imaging studies  CBC: Recent Labs  Lab 09/07/19 0455 09/08/19 0518 09/09/19 0443 09/11/19 0315 09/12/19 0510  WBC 13.7* 14.3* 14.0* 12.1* 11.2*  NEUTROABS  --   --  11.1* 8.9* 8.1*  HGB 13.9 14.8 14.8 14.5 14.5  HCT 43.6 45.6 45.7 44.1 44.8  MCV 88.1 88.4 87.9 87.2 89.6  PLT 148* 146* 158 187 063   Basic Metabolic Panel: Recent Labs  Lab 09/07/19 0455 09/07/19 0455 09/08/19 0518 09/09/19 0443 09/10/19 0414 09/11/19 0315 09/12/19 0510  NA  137   < > 139 143 145 142 142  K 3.7   < > 3.3* 3.1* 3.6 3.7 3.6  CL 110   < > 109 108 108 105 100  CO2 17*   < > 20* 23 26 28 30   GLUCOSE 112*   < > 84 98 106* 108* 100*  BUN 20   < > 16 15 14 15 15   CREATININE 0.89   < > 0.85 0.91 0.89 0.84 0.85  CALCIUM 8.1*   < > 8.2* 8.5* 8.6* 8.3* 8.4*  MG  --   --   --  1.6* 1.8 1.6* 2.0  PHOS 2.0*  --   --  2.5  --   --   --    < > = values in this interval not displayed.   GFR: Estimated Creatinine Clearance: 40 mL/min (by C-G formula based on SCr of 0.85 mg/dL). Liver Function Tests: Recent Labs  Lab 09/08/19 0518 09/09/19 0443 09/10/19 0414 09/11/19 0315 09/12/19 0510  AST 50* 52* 54* 49* 44*  ALT 78* 67* 66* 49* 42  ALKPHOS 64 66 66 60 58  BILITOT 1.2 1.6* 1.4* 1.3* 1.3*  PROT 5.0* 5.3* 5.5* 5.4* 5.1*  ALBUMIN 2.3* 2.6* 2.8* 2.7* 2.5*   No results for input(s): LIPASE, AMYLASE in the last 168 hours. Recent Labs  Lab 09/10/19 0414 09/11/19 0315 09/12/19 0510  AMMONIA 43* 53* 40*   Coagulation Profile: No results for input(s): INR, PROTIME in the last 168 hours. Cardiac Enzymes: Recent Labs  Lab 09/10/19 0414  CKTOTAL 65   BNP (last 3 results) No results for input(s): PROBNP in the last 8760 hours. HbA1C: No results for input(s): HGBA1C in the last 72 hours. CBG: Recent Labs  Lab 09/12/19 1658 09/12/19 2025 09/13/19 0116 09/13/19 0449 09/13/19 0735  GLUCAP 113* 112* 155* 108* 86   Lipid Profile: No results for input(s): CHOL, HDL, LDLCALC, TRIG, CHOLHDL, LDLDIRECT in the last 72 hours. Thyroid Function Tests: No results for input(s): TSH, T4TOTAL, FREET4, T3FREE, THYROIDAB in the last 72 hours. Anemia Panel: No results for input(s): VITAMINB12, FOLATE, FERRITIN, TIBC, IRON, RETICCTPCT in the last 72 hours. Sepsis Labs: Recent Labs  Lab 09/06/19 1311 09/07/19 0455 09/08/19 0518  PROCALCITON 0.15 0.13 <0.10    Recent Results (from the past 240 hour(s))  Urine Culture     Status: None   Collection  Time: 09/08/19  4:37 PM   Specimen: Urine, Catheterized  Result Value  Ref Range Status   Specimen Description URINE, CATHETERIZED  Final   Special Requests NONE  Final   Culture   Final    NO GROWTH Performed at [facility], [address] [place], [place] [address]    Report Status 09/09/2019 FINAL  Final  SARS CORONAVIRUS 2 (TAT 6-24 HRS) Nasopharyngeal Nasopharyngeal Swab     Status: None   Collection Time: 09/08/19  5:27 PM   Specimen: Nasopharyngeal Swab  Result Value Ref Range Status   SARS Coronavirus 2 NEGATIVE NEGATIVE Final    Comment: (NOTE) SARS-CoV-2 target nucleic acids are NOT DETECTED. The SARS-CoV-2 RNA is generally detectable in upper and lower respiratory specimens during the acute phase of infection. Negative results do not preclude SARS-CoV-2 infection, do not rule out co-infections with other pathogens, and should not be used as the sole basis for treatment or other patient management decisions. Negative results must be combined with clinical observations, patient history, and epidemiological information. The expected result is Negative. Fact Sheet for Patients: SugarRoll.be Fact Sheet for Healthcare Providers: https://www.woods-mathews.com/ This test is not yet approved or cleared by the [place] FDA and  has been authorized for detection and/or diagnosis of SARS-CoV-2 by FDA under an Emergency Use Authorization (EUA). This EUA will remain  in effect (meaning this test can be used) for the duration of the COVID-19 declaration under Section 56 4(b)(1) of the Act, 21 U.S.C. section 360bbb-3(b)(1), unless the authorization is terminated or revoked sooner. Performed at [facility], [place] [address], [place], [place] [address]   SARS CORONAVIRUS 2 (TAT 6-24 HRS) Nasopharyngeal Nasopharyngeal Swab     Status: None   Collection Time: 09/11/19  3:58 PM   Specimen: Nasopharyngeal Swab  Result Value Ref  Range Status   SARS Coronavirus 2 NEGATIVE NEGATIVE Final    Comment: (NOTE) SARS-CoV-2 target nucleic acids are NOT DETECTED. The SARS-CoV-2 RNA is generally detectable in upper and lower respiratory specimens during the acute phase of infection. Negative results do not preclude SARS-CoV-2 infection, do not rule out co-infections with other pathogens, and should not be used as the sole basis for treatment or other patient management decisions. Negative results must be combined with clinical observations, patient history, and epidemiological information. The expected result is Negative. Fact Sheet for Patients: SugarRoll.be Fact Sheet for Healthcare Providers: https://www.woods-mathews.com/ This test is not yet approved or cleared by the [place] FDA and  has been authorized for detection and/or diagnosis of SARS-CoV-2 by FDA under an Emergency Use Authorization (EUA). This EUA will remain  in effect (meaning this test can be used) for the duration of the COVID-19 declaration under Section 56 4(b)(1) of the Act, 21 U.S.C. section 360bbb-3(b)(1), unless the authorization is terminated or revoked sooner. Performed at [facility], [place] [address] [place],  [address]          Radiology Studies: No results found.      Scheduled Meds: . atorvastatin  20 mg Oral q1800  . chlorhexidine  15 mL Mouth Rinse BID  . feeding supplement (ENSURE ENLIVE)  237 mL Oral BID BM  . folic acid  1 mg Oral Daily  . furosemide  40 mg Oral Daily  . lactulose  10 g Oral BID  . mouth rinse  15 mL Mouth Rinse q12n4p  . metoprolol tartrate  50 mg Oral BID  . multivitamin with minerals  1 tablet Oral Daily  . pantoprazole  40 mg Oral BID  .  senna-docusate  1 tablet Oral BID  . tamsulosin  0.4 mg Oral Daily   Continuous Infusions: . sodium chloride 10 mL/hr at 09/08/19 2300     LOS: 12 days    Time spent: 9mns. More than 50% of  that time was spent in counseling and/or coordination of care.      AShelly Coss MD [facility] P4/20/2021, 7:42 AM

## 2019-09-13 NOTE — Progress Notes (Signed)
Physical Therapy Treatment Patient Details Name: Tiffany Charles MRN: [id number] DOB: 11-07-1929 Today's Date: 09/13/2019    History of Present Illness 86 year old female with PMH as below, which is significant for Atrial fibrillation on Xarelto, COPD, CAD, and HTN. She was recently admitted to [facility] and treated for cellulitis with keflex and doxycycline. She presented to [facility] ED 4/8 hypoxemic and hypotensive, after being found altered at SNF with coffee-ground emesis beside her bed. Intubated 4/8-4/11.    PT Comments    Pt supine in bed on entry, agreeable to get up to recliner. Pt is limited in safe mobility by decreased cognition in presence of decreased strength and balance. Pt is min Ax2 for bed mobility, transfers and ambulation of 5 feet with RW. Pt able to ambulate to window and look out for approximately 3 minutes, before sitting in recliner. D/c plans remain appropriate at this time. PT will continue to follow acutely.    Follow Up Recommendations  SNF     Equipment Recommendations  Other (comment)(TBA)    Recommendations for Other Services       Precautions / Restrictions Precautions Precautions: Fall Restrictions Weight Bearing Restrictions: No    Mobility  Bed Mobility Overal bed mobility: Needs Assistance Bed Mobility: Supine to Sit     Supine to sit: Min assist     General bed mobility comments: min Ax2 for   Transfers Overall transfer level: Needs assistance Equipment used: Rolling walker (2 wheeled) Transfers: Sit to/from Stand Sit to Stand: Min assist         General transfer comment: min A for power up and steadying in standing from bed and from [facility]  Ambulation/Gait Ambulation/Gait assistance: Min assist;+2 physical assistance Gait Distance (Feet): 5 Feet Assistive device: Rolling walker (2 wheeled) Gait Pattern/deviations: Step-to pattern Gait velocity: slower Gait velocity interpretation: <1.31 ft/sec, indicative of household  ambulator General Gait Details: min A for steadying and RW management to ambulate from [facility] at foot of bed to HoB to return to supine           Balance Overall balance assessment: Mild deficits observed, not formally tested   Sitting balance-Leahy Scale: Fair     Standing balance support: Bilateral upper extremity supported Standing balance-Leahy Scale: Fair Standing balance comment: maintained static standing EoB while BSC moved in place                             Cognition Arousal/Alertness: Awake/alert Behavior During Therapy: WFL for tasks assessed/performed Overall Cognitive Status: No family/caregiver present to determine baseline cognitive functioning                                 General Comments: Oriented to person         General Comments General comments (skin integrity, edema, etc.): VSS on RA      Pertinent Vitals/Pain Pain Assessment: No/denies pain           PT Goals (current goals can now be found in the care plan section) Acute Rehab PT Goals Patient Stated Goal: pt unable to relate goals PT Goal Formulation: With patient Time For Goal Achievement: 09/20/19 Potential to Achieve Goals: Good Progress towards PT goals: Progressing toward goals    Frequency    Min 2X/week      PT Plan Current plan remains appropriate  AM-PAC PT "6 Clicks" Mobility   Outcome Measure  Help needed turning from your back to your side while in a flat bed without using bedrails?: A Little Help needed moving from lying on your back to sitting on the side of a flat bed without using bedrails?: A Little Help needed moving to and from a bed to a chair (including a wheelchair)?: A Little Help needed standing up from a chair using your arms (e.g., wheelchair or bedside chair)?: A Little Help needed to walk in hospital room?: A Little Help needed climbing 3-5 steps with a railing? : A Little 6 Click Score: 18    End of Session  Equipment Utilized During Treatment: Gait belt Activity Tolerance: Patient tolerated treatment well;Patient limited by fatigue Patient left: with call bell/phone within reach;in chair;with chair alarm set Nurse Communication: Mobility status PT Visit Diagnosis: Unsteadiness on feet (R26.81);Muscle weakness (generalized) (M62.81)     Time: 1008-1030 PT Time Calculation (min) (ACUTE ONLY): 22 min  Charges:                        Benjamine Mola B. Migdalia Dk PT, DPT Acute Rehabilitation Services Pager [phone] Office [phone]    [place] 09/13/2019, 2:35 PM

## 2019-09-13 NOTE — Plan of Care (Signed)
  Problem: Education: Goal: Knowledge of General Education information will improve Description: Including pain rating scale, medication(s)/side effects and non-pharmacologic comfort measures Outcome: Progressing   Problem: Health Behavior/Discharge Planning: Goal: Ability to manage health-related needs will improve Outcome: Progressing   Problem: Clinical Measurements: Goal: Ability to maintain clinical measurements within normal limits will improve Outcome: Progressing Goal: Will remain free from infection Outcome: Progressing Goal: Diagnostic test results will improve Outcome: Progressing Goal: Respiratory complications will improve Outcome: Progressing Goal: Cardiovascular complication will be avoided Outcome: Progressing   Problem: Activity: Goal: Risk for activity intolerance will decrease Outcome: Progressing   Problem: Nutrition: Goal: Adequate nutrition will be maintained Outcome: Progressing   Problem: Coping: Goal: Level of anxiety will decrease Outcome: Progressing   Problem: Elimination: Goal: Will not experience complications related to bowel motility Outcome: Progressing Goal: Will not experience complications related to urinary retention Outcome: Progressing   Problem: Pain Managment: Goal: General experience of comfort will improve Outcome: Progressing   Problem: Safety: Goal: Ability to remain free from injury will improve Outcome: Progressing   Problem: Skin Integrity: Goal: Risk for impaired skin integrity will decrease Outcome: Progressing   Problem: Activity: Goal: Ability to tolerate increased activity will improve Outcome: Progressing   Problem: Respiratory: Goal: Ability to maintain a clear airway and adequate ventilation will improve Outcome: Progressing   Problem: Role Relationship: Goal: Method of communication will improve Outcome: Progressing   Problem: Education: Goal: Knowledge of disease or condition will  improve Outcome: Progressing Goal: Understanding of medication regimen will improve Outcome: Progressing Goal: Individualized Educational Video(s) Outcome: Progressing   Problem: Activity: Goal: Ability to tolerate increased activity will improve Outcome: Progressing   Problem: Cardiac: Goal: Ability to achieve and maintain adequate cardiopulmonary perfusion will improve Outcome: Progressing   Problem: Health Behavior/Discharge Planning: Goal: Ability to safely manage health-related needs after discharge will improve Outcome: Progressing

## 2019-09-14 DIAGNOSIS — R578 Other shock: Secondary | ICD-10-CM | POA: Diagnosis not present

## 2019-09-14 LAB — GLUCOSE, CAPILLARY
Glucose-Capillary: 112 mg/dL — ABNORMAL HIGH (ref 70–99)
Glucose-Capillary: 120 mg/dL — ABNORMAL HIGH (ref 70–99)
Glucose-Capillary: 124 mg/dL — ABNORMAL HIGH (ref 70–99)
Glucose-Capillary: 145 mg/dL — ABNORMAL HIGH (ref 70–99)
Glucose-Capillary: 71 mg/dL (ref 70–99)
Glucose-Capillary: 75 mg/dL (ref 70–99)
Glucose-Capillary: 96 mg/dL (ref 70–99)

## 2019-09-14 LAB — CBC WITH DIFFERENTIAL/PLATELET
Abs Immature Granulocytes: 0.04 10*3/uL (ref 0.00–0.07)
Basophils Absolute: 0.1 10*3/uL (ref 0.0–0.1)
Basophils Relative: 1 %
Eosinophils Absolute: 0.2 10*3/uL (ref 0.0–0.5)
Eosinophils Relative: 2 %
HCT: 43.9 % (ref 36.0–46.0)
Hemoglobin: 13.8 g/dL (ref 12.0–15.0)
Immature Granulocytes: 0 %
Lymphocytes Relative: 18 %
Lymphs Abs: 1.8 10*3/uL (ref 0.7–4.0)
MCH: 28.6 pg (ref 26.0–34.0)
MCHC: 31.4 g/dL (ref 30.0–36.0)
MCV: 91.1 fL (ref 80.0–100.0)
Monocytes Absolute: 0.8 10*3/uL (ref 0.1–1.0)
Monocytes Relative: 7 %
Neutro Abs: 7.4 10*3/uL (ref 1.7–7.7)
Neutrophils Relative %: 72 %
Platelets: 201 10*3/uL (ref 150–400)
RBC: 4.82 MIL/uL (ref 3.87–5.11)
RDW: 17.2 % — ABNORMAL HIGH (ref 11.5–15.5)
WBC: 10.3 10*3/uL (ref 4.0–10.5)
nRBC: 0 % (ref 0.0–0.2)

## 2019-09-14 LAB — BASIC METABOLIC PANEL
Anion gap: 10 (ref 5–15)
BUN: 32 mg/dL — ABNORMAL HIGH (ref 8–23)
CO2: 34 mmol/L — ABNORMAL HIGH (ref 22–32)
Calcium: 8.5 mg/dL — ABNORMAL LOW (ref 8.9–10.3)
Chloride: 102 mmol/L (ref 98–111)
Creatinine, Ser: 0.86 mg/dL (ref 0.44–1.00)
GFR calc Af Amer: >60 mL/min (ref >60)
GFR calc non Af Amer: >60 mL/min (ref >60)
Glucose, Bld: 102 mg/dL — ABNORMAL HIGH (ref 70–99)
Potassium: 3.1 mmol/L — ABNORMAL LOW (ref 3.5–5.1)
Sodium: 146 mmol/L — ABNORMAL HIGH (ref 135–145)

## 2019-09-14 LAB — SARS CORONAVIRUS 2 (TAT 6-24 HRS): SARS Coronavirus 2: NEGATIVE

## 2019-09-14 MED ORDER — POTASSIUM CHLORIDE CRYS ER 20 MEQ PO TBCR
40.0000 meq | EXTENDED_RELEASE_TABLET | Freq: Two times a day (BID) | ORAL | Status: AC
  Administered 2019-09-14 (×2): 40 meq via ORAL
  Filled 2019-09-14 (×2): qty 2

## 2019-09-14 NOTE — Plan of Care (Signed)
  Problem: Education: Goal: Knowledge of General Education information will improve Description: Including pain rating scale, medication(s)/side effects and non-pharmacologic comfort measures Outcome: Progressing   Problem: Health Behavior/Discharge Planning: Goal: Ability to manage health-related needs will improve Outcome: Progressing   Problem: Clinical Measurements: Goal: Ability to maintain clinical measurements within normal limits will improve Outcome: Progressing Goal: Will remain free from infection Outcome: Progressing Goal: Diagnostic test results will improve Outcome: Progressing Goal: Respiratory complications will improve Outcome: Progressing Goal: Cardiovascular complication will be avoided Outcome: Progressing   Problem: Activity: Goal: Risk for activity intolerance will decrease Outcome: Progressing   Problem: Nutrition: Goal: Adequate nutrition will be maintained Outcome: Progressing   Problem: Coping: Goal: Level of anxiety will decrease Outcome: Progressing   Problem: Elimination: Goal: Will not experience complications related to bowel motility Outcome: Progressing Goal: Will not experience complications related to urinary retention Outcome: Progressing   Problem: Pain Managment: Goal: General experience of comfort will improve Outcome: Progressing   Problem: Safety: Goal: Ability to remain free from injury will improve Outcome: Progressing   Problem: Skin Integrity: Goal: Risk for impaired skin integrity will decrease Outcome: Progressing   Problem: Activity: Goal: Ability to tolerate increased activity will improve Outcome: Progressing   Problem: Respiratory: Goal: Ability to maintain a clear airway and adequate ventilation will improve Outcome: Progressing   Problem: Role Relationship: Goal: Method of communication will improve Outcome: Progressing   Problem: Education: Goal: Knowledge of disease or condition will  improve Outcome: Progressing Goal: Understanding of medication regimen will improve Outcome: Progressing Goal: Individualized Educational Video(s) Outcome: Progressing   Problem: Activity: Goal: Ability to tolerate increased activity will improve Outcome: Progressing   Problem: Cardiac: Goal: Ability to achieve and maintain adequate cardiopulmonary perfusion will improve Outcome: Progressing   Problem: Health Behavior/Discharge Planning: Goal: Ability to safely manage health-related needs after discharge will improve Outcome: Progressing

## 2019-09-14 NOTE — Progress Notes (Signed)
Nutrition Follow-up  DOCUMENTATION CODES:   Not applicable  INTERVENTION:   -Continue Ensure Enlive po BID, each supplement provides 350 kcal and 20 grams of protein -Continue MVI with minerals daily -Continue Magic cup TID with meals, each supplement provides 290 kcal and 9 grams of protein -D/c Hormel Shake  NUTRITION DIAGNOSIS:   Inadequate oral intake related to inability to eat as evidenced by NPO status.  Progressing; consuming 50-100% of meals on dysphagia 1 diet with nectar thick liquids  GOAL:   Patient will meet greater than or equal to 90% of their needs  Progressing   MONITOR:   PO intake, Supplement acceptance, Diet advancement, Labs, Weight trends, Skin, I & O's  REASON FOR ASSESSMENT:   Ventilator    ASSESSMENT:   86 yo female admitted with hemorrhagic shock due to GI hemorrhage. Required intubation on admission. PMH includes A fib, COPD, CAD, HTN.  4/11- extubated 4/12- s/p BSE- recommend continue NPO 4/13- s/p BSE- advanced to dysphagia 1 diet with nectar thick liquids  Reviewed I/O's: -561 ml x 24 hours and +1.4 L since admission  UOP: 900 ml x 24 hours  Pt receiving nursing care at time of visit.   Pt with improved appetite. Noted meal completion 50-100%. Pt is also consuming Ensure supplements.   Plan for pt to d/c to SNF with palliative services. Pt awaiting insurance authorization and bed availability.   Labs reviewed: Na: 146, K: 3.1, CBGS: 71-120.   Diet Order:   Diet Order            DIET - DYS 1 Room service appropriate? Yes; Fluid consistency: Nectar Thick  Diet effective now              EDUCATION NEEDS:   No education needs have been identified at this time  Skin:  Skin Assessment: Skin Integrity Issues: Skin Integrity Issues:: Other (Comment) Other: non pressure wound to lt foot with swelling, skin tear to lt arm  Last BM:  09/13/19  Height:   Ht Readings from Last 1 Encounters:  09/01/19 5\' 6"  (1.676 m)     Weight:   Wt Readings from Last 1 Encounters:  09/14/19 55.5 kg    Ideal Body Weight:  59.1 kg  BMI:  Body mass index is 19.75 kg/m.  Estimated Nutritional Needs:   Kcal:  1500-1700  Protein:  75-90 grams  Fluid:  > 1.5 L    Loistine Chance, RD, LDN, [place] Registered Dietitian II Certified Diabetes Care and Education Specialist Please refer to [facility] for RD and/or RD on-call/weekend/after hours pager

## 2019-09-14 NOTE — TOC Progression Note (Signed)
Transition of Care [facility]) - Progression Note    Patient Details  Name: Tiffany Charles MRN: [id number] Date of Birth: 09-23-1929  Transition of Care [facility]) CM/SW [place], [place] Phone Number: 09/14/2019, 9:41 AM  Clinical Narrative:     CSW spoke with patient's grandson [name] who reports he had sent over paperwork to [facility] x2 yesterday. CSW spoke with [facility] they report they have not received any paperwork. CSW called [name] back to see if he can email it to CSW To see if it would go through, he did not answer CSW lvm with him.   Expected Discharge Plan: [place] Barriers to Discharge: Continued Medical Work up  Expected Discharge Plan and Services Expected Discharge Plan: [place] arrangements for the past 2 months: [place]                                       Social Determinants of Health (SDOH) Interventions    Readmission Risk Interventions No flowsheet data found.

## 2019-09-14 NOTE — Plan of Care (Signed)
  Problem: Education: Goal: Knowledge of General Education information will improve Description: Including pain rating scale, medication(s)/side effects and non-pharmacologic comfort measures Outcome: Progressing   Problem: Health Behavior/Discharge Planning: Goal: Ability to manage health-related needs will improve Outcome: Progressing   Problem: Clinical Measurements: Goal: Ability to maintain clinical measurements within normal limits will improve Outcome: Progressing Goal: Will remain free from infection Outcome: Progressing Goal: Diagnostic test results will improve Outcome: Progressing Goal: Respiratory complications will improve Outcome: Progressing Goal: Cardiovascular complication will be avoided Outcome: Progressing   Problem: Activity: Goal: Risk for activity intolerance will decrease Outcome: Progressing   Problem: Nutrition: Goal: Adequate nutrition will be maintained Outcome: Progressing Note: Patient is able to eat on their own.    Problem: Coping: Goal: Level of anxiety will decrease Outcome: Progressing   Problem: Elimination: Goal: Will not experience complications related to bowel motility Outcome: Progressing Goal: Will not experience complications related to urinary retention Outcome: Progressing   Problem: Pain Managment: Goal: General experience of comfort will improve Outcome: Progressing   Problem: Safety: Goal: Ability to remain free from injury will improve Outcome: Progressing   Problem: Skin Integrity: Goal: Risk for impaired skin integrity will decrease Outcome: Progressing   Problem: Activity: Goal: Ability to tolerate increased activity will improve Outcome: Progressing   Problem: Respiratory: Goal: Ability to maintain a clear airway and adequate ventilation will improve Outcome: Progressing   Problem: Role Relationship: Goal: Method of communication will improve Outcome: Progressing   Problem: Education: Goal: Knowledge  of disease or condition will improve Outcome: Progressing Goal: Understanding of medication regimen will improve Outcome: Progressing Goal: Individualized Educational Video(s) Outcome: Progressing   Problem: Activity: Goal: Ability to tolerate increased activity will improve Outcome: Progressing   Problem: Cardiac: Goal: Ability to achieve and maintain adequate cardiopulmonary perfusion will improve Outcome: Progressing   Problem: Health Behavior/Discharge Planning: Goal: Ability to safely manage health-related needs after discharge will improve Outcome: Progressing

## 2019-09-14 NOTE — Progress Notes (Signed)
PROGRESS NOTE    Tiffany Charles  [name]:[id number] DOB: [birth year] DOA: 09/01/2019 PCP: [name], MD     Brief Narrative:  Tiffany Charles is an 86 year old female with history of permanent A. fib on Xarelto, COPD, coronary disease, hypertension, chronic diastolic CHF, hyperlipidemia, pulmonary hypertension, skin cancer who presented to the emergency department with complaints of altered mental status, coffee-ground emesis at skilled nursing facility.  Presentation, she was hypoxic, hypotensive, confused needing intubation for airway protection.  INR was more than 10.  CBC showed hemoglobin of 5.5.  She was given Kcentra and blood transfusion.  GI consulted. CT abdomen pelvis on admission no acute GI bleed, large amount of stool at the level of patient's rectum, Patient was admitted under PCCM service.  Patient was extubated on 4/11. EGD deferred due to advanced age/comorbidities.   New events last 24 hours / Subjective: Patient tolerating dysphagia 1 diet.  Family at bedside wonders if patient can be advanced to a regular diet, also wants to continue physical therapy.  Initially did not want SNF, but willing to try rehab for short period of time per physical therapy recommendations.  Assessment & Plan:   Principal Problem:   Hemorrhagic shock [facility]) Active Problems:   Permanent atrial fibrillation   COPD - PFTs in June 2014 with MET test   CAD, RCA BMS '09, 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 low risk Myoview Feb 2013   Chronic anticoagulation- Xarelto   Goals of care, counseling/discussion   Chronic diastolic (congestive) heart failure (HCC)   Weakness   Mild pulmonary hypertension (HCC)   Occipital stroke (HCC)   Acute liver failure   Acute hypoxemic respiratory failure ([place])   Upper GI bleed   Palliative care by specialist   Hemorrhagic shock: Secondary to upper GI bleed.  She was on Xarelto at home.  Reversed with Kcentra, platelets transfusion, status post 4 units of  PRBC.  GI was consulted.  EGD deferred due to age/comorbidities.  On Protonix.  Resolved and now hemodynamically stable  Acute hypoxic respiratory failure: On presentation, she was intubated for airway protection.  Successful extubation on 4/11.  Currently on room air.  Completed antibiotic therapy for aspiration pneumonia  NSTEMI/ischemic cardiomyopathy/acute on chronic systolic CHF exacerbation: Troponin was elevated on presentation.  Echocardiogram showed EF 35 to 40%, regional wall motion abnormalities, RV systolic function severely reduced, severely dilated left atria, right atria moderate MR mild to moderate TR. cardiology was consulted, no intervention planned. Continue lasix 40 mg daily.  A. fib with RVR: Not a candidate for anticoagulation. Continue Lopressor to 50 mg twice a day.    AKI: Resolved  Transaminitis: Most likely secondary to shock liver on presentation versus liver congestion due to CHF.  LFTs trended down.  Hepatitis panel negative.  Dysphagia: Dysphagia 1 diet.  Speech therapy following.  She was treated with Unasyn for aspiration pneumonia.  Hyperlipidemia: Continue Lipitor  Acute metabolic encephalopathy: Multifactorial cause.  Continue supportive care.  History of CVA .  History of dementia diagnosed about 6 to 8 months ago.  Improved  Peripheral vascular disease/chronic left foot wound: S/p Percutaneous transluminal angioplasty and stent placementleftsuperficial femoral artery and popliteal on 3/17. Daily application of small foam dressing for left foot wound  Constipation: Currently on lactulose.  Goals of care: DNR/DNI.  Palliative care was consulted.  Recommend outpatient follow-up with palliative care.  Hypokalemia -Replace, trend  PT/OT evaluations performed. SNF recommended. SNF appropriate as the patient has received 3 days  of hospital care (or the 3 day period has been waived by the patient's insurance company) and is felt to need rehab  services to restore this patient to their prior level of function to achieve safe transition back to home care. This patient needs rehab services for at least 5 days per week and skilled nursing services daily to facilitate this transition. Rehab is being requested as the most appropriate d/c option for this patient and is NOT felt to be for custodial care as evidenced by improvement in her mobility with physical therapy.   DVT prophylaxis: SCD Code Status: DNR Family Communication: Family at bedside Disposition Plan:   Status is: Inpatient  Remains inpatient appropriate because:Unsafe d/c plan   Dispo: The patient is from: SNF              Anticipated d/c is to: SNF              Anticipated d/c date is: 1 day              Patient currently is medically stable to d/c.  Awaiting placement   Consultants:   Cardiology  GI  PCCM  Palliative care   Antimicrobials:  Anti-infectives (From admission, onward)   Start     Dose/Rate Route Frequency Ordered Stop   09/09/19 1800  amoxicillin-clavulanate (AUGMENTIN) 500-125 MG per tablet 500 mg     500 mg Oral 2 times daily 09/09/19 1353 09/12/19 2003   09/06/19 1430  Ampicillin-Sulbactam (UNASYN) 3 g in sodium chloride 0.9 % 100 mL IVPB  Status:  Discontinued     3 g 200 mL/hr over 30 Minutes Intravenous Every 8 hours 09/06/19 1340 09/09/19 1353   09/02/19 2030  ceFEPIme (MAXIPIME) 2 g in sodium chloride 0.9 % 100 mL IVPB  Status:  Discontinued     2 g 200 mL/hr over 30 Minutes Intravenous Every 24 hours 09/01/19 2046 09/03/19 0900   09/01/19 2043  vancomycin variable dose per unstable renal function (pharmacist dosing)  Status:  Discontinued      Does not apply See admin instructions 09/01/19 2046 09/03/19 0900   09/01/19 1945  ceFEPIme (MAXIPIME) 2 g in sodium chloride 0.9 % 100 mL IVPB     2 g 200 mL/hr over 30 Minutes Intravenous  Once 09/01/19 1930 09/01/19 2051   09/01/19 1945  metroNIDAZOLE (FLAGYL) IVPB 500 mg     500  mg 100 mL/hr over 60 Minutes Intravenous  Once 09/01/19 1930 09/02/19 0129   09/01/19 1945  vancomycin (VANCOCIN) IVPB 1000 mg/200 mL premix     1,000 mg 200 mL/hr over 60 Minutes Intravenous  Once 09/01/19 1930 09/02/19 0129       Objective: Vitals:   09/13/19 1553 09/13/19 2051 09/14/19 0416 09/14/19 0915  BP: 94/62 (!) 100/58 131/76 115/63  Pulse: 88 98 72 89  Resp: 16 16 18    Temp: 98.4 F (36.9 C) 97.8 F (36.6 C) 97.6 F (36.4 C)   TempSrc: Oral     SpO2: 96% 97% 100% 98%  Weight:   55.5 kg   Height:        Intake/Output Summary (Last 24 hours) at 09/14/2019 1047 Last data filed at 09/14/2019 0930 Gross per 24 hour  Intake 820 ml  Output 901 ml  Net -81 ml   Filed Weights   09/12/19 0133 09/13/19 0443 09/14/19 0416  Weight: 56 kg 54.3 kg 55.5 kg    Examination:  General exam: Appears calm and comfortable  Respiratory  system: Clear to auscultation. Respiratory effort normal. No respiratory distress. No conversational dyspnea.  Cardiovascular system: S1 & S2 heard, irregular rhythm, rate low 100s. No murmurs. No pedal edema. Gastrointestinal system: Abdomen is nondistended, soft and nontender. Normal bowel sounds heard. Central nervous system: Alert  Extremities: Symmetric in appearance  Skin: No rashes, lesions or ulcers on exposed skin   Data Reviewed: I have personally reviewed following labs and imaging studies  CBC: Recent Labs  Lab 09/08/19 0518 09/09/19 0443 09/11/19 0315 09/12/19 0510 09/14/19 0532  WBC 14.3* 14.0* 12.1* 11.2* 10.3  NEUTROABS  --  11.1* 8.9* 8.1* 7.4  HGB 14.8 14.8 14.5 14.5 13.8  HCT 45.6 45.7 44.1 44.8 43.9  MCV 88.4 87.9 87.2 89.6 91.1  PLT 146* 158 187 188 875   Basic Metabolic Panel: Recent Labs  Lab 09/09/19 0443 09/10/19 0414 09/11/19 0315 09/12/19 0510 09/14/19 0532  NA 143 145 142 142 146*  K 3.1* 3.6 3.7 3.6 3.1*  CL 108 108 105 100 102  CO2 23 26 28 30  34*  GLUCOSE 98 106* 108* 100* 102*  BUN 15 14 15 15   32*  CREATININE 0.91 0.89 0.84 0.85 0.86  CALCIUM 8.5* 8.6* 8.3* 8.4* 8.5*  MG 1.6* 1.8 1.6* 2.0  --   PHOS 2.5  --   --   --   --    GFR: Estimated Creatinine Clearance: 40.4 mL/min (by C-G formula based on SCr of 0.86 mg/dL). Liver Function Tests: Recent Labs  Lab 09/08/19 0518 09/09/19 0443 09/10/19 0414 09/11/19 0315 09/12/19 0510  AST 50* 52* 54* 49* 44*  ALT 78* 67* 66* 49* 42  ALKPHOS 64 66 66 60 58  BILITOT 1.2 1.6* 1.4* 1.3* 1.3*  PROT 5.0* 5.3* 5.5* 5.4* 5.1*  ALBUMIN 2.3* 2.6* 2.8* 2.7* 2.5*   No results for input(s): LIPASE, AMYLASE in the last 168 hours. Recent Labs  Lab 09/10/19 0414 09/11/19 0315 09/12/19 0510  AMMONIA 43* 53* 40*   Coagulation Profile: No results for input(s): INR, PROTIME in the last 168 hours. Cardiac Enzymes: Recent Labs  Lab 09/10/19 0414  CKTOTAL 65   BNP (last 3 results) No results for input(s): PROBNP in the last 8760 hours. HbA1C: No results for input(s): HGBA1C in the last 72 hours. CBG: Recent Labs  Lab 09/13/19 1638 09/13/19 2006 09/14/19 0009 09/14/19 0416 09/14/19 0743  GLUCAP 71 144* 124* 120* 96   Lipid Profile: No results for input(s): CHOL, HDL, LDLCALC, TRIG, CHOLHDL, LDLDIRECT in the last 72 hours. Thyroid Function Tests: No results for input(s): TSH, T4TOTAL, FREET4, T3FREE, THYROIDAB in the last 72 hours. Anemia Panel: No results for input(s): VITAMINB12, FOLATE, FERRITIN, TIBC, IRON, RETICCTPCT in the last 72 hours. Sepsis Labs: Recent Labs  Lab 09/08/19 0518  PROCALCITON <0.10    Recent Results (from the past 240 hour(s))  Urine Culture     Status: None   Collection Time: 09/08/19  4:37 PM   Specimen: Urine, Catheterized  Result Value Ref Range Status   Specimen Description URINE, CATHETERIZED  Final   Special Requests NONE  Final   Culture   Final    NO GROWTH Performed at [facility], [address], [place], [place] [address]    Report Status 09/09/2019 FINAL  Final  SARS  CORONAVIRUS 2 (TAT 6-24 HRS) Nasopharyngeal Nasopharyngeal Swab     Status: None   Collection Time: 09/08/19  5:27 PM   Specimen: Nasopharyngeal Swab  Result Value Ref Range Status  SARS Coronavirus 2 NEGATIVE NEGATIVE Final    Comment: (NOTE) SARS-CoV-2 target nucleic acids are NOT DETECTED. The SARS-CoV-2 RNA is generally detectable in upper and lower respiratory specimens during the acute phase of infection. Negative results do not preclude SARS-CoV-2 infection, do not rule out co-infections with other pathogens, and should not be used as the sole basis for treatment or other patient management decisions. Negative results must be combined with clinical observations, patient history, and epidemiological information. The expected result is Negative. Fact Sheet for Patients: SugarRoll.be Fact Sheet for Healthcare Providers: https://www.woods-mathews.com/ This test is not yet approved or cleared by the [place] FDA and  has been authorized for detection and/or diagnosis of SARS-CoV-2 by FDA under an Emergency Use Authorization (EUA). This EUA will remain  in effect (meaning this test can be used) for the duration of the COVID-19 declaration under Section 56 4(b)(1) of the Act, 21 U.S.C. section 360bbb-3(b)(1), unless the authorization is terminated or revoked sooner. Performed at [facility], [place] [address] [place], [place] [address]   SARS CORONAVIRUS 2 (TAT 6-24 HRS) Nasopharyngeal Nasopharyngeal Swab     Status: None   Collection Time: 09/11/19  3:58 PM   Specimen: Nasopharyngeal Swab  Result Value Ref Range Status   SARS Coronavirus 2 NEGATIVE NEGATIVE Final    Comment: (NOTE) SARS-CoV-2 target nucleic acids are NOT DETECTED. The SARS-CoV-2 RNA is generally detectable in upper and lower respiratory specimens during the acute phase of infection. Negative results do not preclude SARS-CoV-2 infection, do not rule  out co-infections with other pathogens, and should not be used as the sole basis for treatment or other patient management decisions. Negative results must be combined with clinical observations, patient history, and epidemiological information. The expected result is Negative. Fact Sheet for Patients: SugarRoll.be Fact Sheet for Healthcare Providers: https://www.woods-mathews.com/ This test is not yet approved or cleared by the [place] FDA and  has been authorized for detection and/or diagnosis of SARS-CoV-2 by FDA under an Emergency Use Authorization (EUA). This EUA will remain  in effect (meaning this test can be used) for the duration of the COVID-19 declaration under Section 56 4(b)(1) of the Act, 21 U.S.C. section 360bbb-3(b)(1), unless the authorization is terminated or revoked sooner. Performed at [facility], [place] [address], [place], [place] [address]       Radiology Studies: No results found.    Scheduled Meds: . atorvastatin  20 mg Oral q1800  . chlorhexidine  15 mL Mouth Rinse BID  . feeding supplement (ENSURE ENLIVE)  237 mL Oral BID BM  . folic acid  1 mg Oral Daily  . furosemide  40 mg Oral Daily  . lactulose  10 g Oral BID  . mouth rinse  15 mL Mouth Rinse q12n4p  . metoprolol tartrate  50 mg Oral BID  . multivitamin with minerals  1 tablet Oral Daily  . pantoprazole  40 mg Oral BID  . potassium chloride  40 mEq Oral BID  . senna-docusate  1 tablet Oral BID  . tamsulosin  0.4 mg Oral Daily   Continuous Infusions: . sodium chloride 10 mL/hr at 09/08/19 2300     LOS: 13 days      Time spent: 40 minutes   Dessa Phi, DO [facility] 09/14/2019, 10:47 AM   Available via Epic secure chat 7am-7pm After these hours, please refer to coverage provider listed on [email or web address]

## 2019-09-14 NOTE — Progress Notes (Signed)
Report received from previous RN, patient resting comfortably in bed. Will continue to monitor throughout the shift.

## 2019-09-14 NOTE — Care Management Important Message (Signed)
Important Message  Patient Details  Name: Tiffany Charles MRN: [id number] Date of Birth: [birth year]   Medicare Important Message Given:  Yes     [name] 09/14/2019, 10:12 AM

## 2019-09-15 DIAGNOSIS — R578 Other shock: Secondary | ICD-10-CM | POA: Diagnosis not present

## 2019-09-15 LAB — CBC
HCT: 44.5 % (ref 36.0–46.0)
Hemoglobin: 14 g/dL (ref 12.0–15.0)
MCH: 28.7 pg (ref 26.0–34.0)
MCHC: 31.5 g/dL (ref 30.0–36.0)
MCV: 91.2 fL (ref 80.0–100.0)
Platelets: 236 10*3/uL (ref 150–400)
RBC: 4.88 MIL/uL (ref 3.87–5.11)
RDW: 17.2 % — ABNORMAL HIGH (ref 11.5–15.5)
WBC: 10.3 10*3/uL (ref 4.0–10.5)
nRBC: 0 % (ref 0.0–0.2)

## 2019-09-15 LAB — GLUCOSE, CAPILLARY
Glucose-Capillary: 101 mg/dL — ABNORMAL HIGH (ref 70–99)
Glucose-Capillary: 113 mg/dL — ABNORMAL HIGH (ref 70–99)
Glucose-Capillary: 118 mg/dL — ABNORMAL HIGH (ref 70–99)
Glucose-Capillary: 188 mg/dL — ABNORMAL HIGH (ref 70–99)
Glucose-Capillary: 93 mg/dL (ref 70–99)
Glucose-Capillary: 96 mg/dL (ref 70–99)

## 2019-09-15 LAB — BASIC METABOLIC PANEL
Anion gap: 8 (ref 5–15)
BUN: 28 mg/dL — ABNORMAL HIGH (ref 8–23)
CO2: 34 mmol/L — ABNORMAL HIGH (ref 22–32)
Calcium: 8.5 mg/dL — ABNORMAL LOW (ref 8.9–10.3)
Chloride: 100 mmol/L (ref 98–111)
Creatinine, Ser: 0.85 mg/dL (ref 0.44–1.00)
GFR calc Af Amer: >60 mL/min (ref >60)
GFR calc non Af Amer: >60 mL/min (ref >60)
Glucose, Bld: 104 mg/dL — ABNORMAL HIGH (ref 70–99)
Potassium: 3.8 mmol/L (ref 3.5–5.1)
Sodium: 142 mmol/L (ref 135–145)

## 2019-09-15 MED ORDER — LACTULOSE 10 GM/15ML PO SOLN: 10.0000 g | Freq: Two times a day (BID) | ORAL | Status: DC | PRN

## 2019-09-15 NOTE — Plan of Care (Signed)
  Problem: Education: Goal: Knowledge of General Education information will improve Description: Including pain rating scale, medication(s)/side effects and non-pharmacologic comfort measures Outcome: Progressing   Problem: Health Behavior/Discharge Planning: Goal: Ability to manage health-related needs will improve Outcome: Progressing   Problem: Clinical Measurements: Goal: Ability to maintain clinical measurements within normal limits will improve Outcome: Progressing Goal: Will remain free from infection Outcome: Progressing Goal: Diagnostic test results will improve Outcome: Progressing Goal: Respiratory complications will improve Outcome: Progressing Goal: Cardiovascular complication will be avoided Outcome: Progressing   Problem: Activity: Goal: Risk for activity intolerance will decrease Outcome: Progressing   Problem: Nutrition: Goal: Adequate nutrition will be maintained Outcome: Progressing   Problem: Coping: Goal: Level of anxiety will decrease Outcome: Progressing   Problem: Elimination: Goal: Will not experience complications related to bowel motility Outcome: Progressing Goal: Will not experience complications related to urinary retention Outcome: Progressing   Problem: Pain Managment: Goal: General experience of comfort will improve Outcome: Progressing   Problem: Safety: Goal: Ability to remain free from injury will improve Outcome: Progressing   Problem: Skin Integrity: Goal: Risk for impaired skin integrity will decrease Outcome: Progressing   Problem: Activity: Goal: Ability to tolerate increased activity will improve Outcome: Progressing   Problem: Respiratory: Goal: Ability to maintain a clear airway and adequate ventilation will improve Outcome: Progressing   Problem: Role Relationship: Goal: Method of communication will improve Outcome: Progressing   Problem: Education: Goal: Knowledge of disease or condition will  improve Outcome: Progressing Goal: Understanding of medication regimen will improve Outcome: Progressing Goal: Individualized Educational Video(s) Outcome: Progressing   Problem: Activity: Goal: Ability to tolerate increased activity will improve Outcome: Progressing   Problem: Cardiac: Goal: Ability to achieve and maintain adequate cardiopulmonary perfusion will improve Outcome: Progressing   Problem: Health Behavior/Discharge Planning: Goal: Ability to safely manage health-related needs after discharge will improve Outcome: Progressing

## 2019-09-15 NOTE — Progress Notes (Signed)
Physical Therapy Treatment Patient Details Name: Tiffany Charles MRN: [id number] DOB: Apr 14, 1930 Today's Date: 09/15/2019    History of Present Illness 86 year old female with PMH as below, which is significant for Atrial fibrillation on Xarelto, COPD, CAD, and HTN. She was recently admitted to [facility] and treated for cellulitis with keflex and doxycycline. She presented to [facility] ED 4/8 hypoxemic and hypotensive, after being found altered at SNF with coffee-ground emesis beside her bed. Intubated 4/8-4/11.    PT Comments    Pt in bed upon arrival of PT, agreeable to PT session with focus on progressing functional mobility and activity tolerance. The pt was able to demo good bed mobility and initial transfer with minA. Through the session the pt performed sit-stand x10 from various surfaces progressing from minA to minG with RW. The pt was also able to demo increased ambulation distance in her room with RW, but continues to need max encouragement and direction. The pt will continue to benefit from skilled PT to further progress functional mobility, strength, and stability to improve safety and independence with mobility.    Follow Up Recommendations  SNF     Equipment Recommendations  Other (comment)(defer to post acute)    Recommendations for Other Services       Precautions / Restrictions Precautions Precautions: Fall Restrictions Weight Bearing Restrictions: No    Mobility  Bed Mobility Overal bed mobility: Needs Assistance Bed Mobility: Supine to Sit     Supine to sit: Min assist     General bed mobility comments: minA with max encouragement to get pt to sit EOB, pt able to move BLE and raise trunk with minA and use of bed rails, modA with bed bad to scoot hips  Transfers Overall transfer level: Needs assistance Equipment used: Rolling walker (2 wheeled) Transfers: Sit to/from Stand Sit to Stand: Min assist;Min guard         General transfer comment: initially  minA to power up, progressing to minG through session as pt completed x10 through session from [facility] for pericare. no assist needed to steady, VC for hand placement  Ambulation/Gait Ambulation/Gait assistance: Min guard Gait Distance (Feet): 15 Feet Assistive device: Rolling walker (2 wheeled) Gait Pattern/deviations: Step-to pattern Gait velocity: slower Gait velocity interpretation: <1.31 ft/sec, indicative of household ambulator General Gait Details: minG for safety, pt able to ambulate in room without assist, and without LOB. close assist/guard for safety, max encouragment to encourage/direct   Stairs             Wheelchair Mobility    Modified Rankin (Stroke Patients Only)       Balance Overall balance assessment: Needs assistance Sitting-balance support: Feet unsupported;Bilateral upper extremity supported Sitting balance-Leahy Scale: Fair Sitting balance - Comments: intermitent assist to maintain upright sitting   Standing balance support: Bilateral upper extremity supported Standing balance-Leahy Scale: Fair Standing balance comment: maintained static standing EoB while BSC moved in place                             Cognition Arousal/Alertness: Awake/alert Behavior During Therapy: Restless;Flat affect;WFL for tasks assessed/performed Overall Cognitive Status: No family/caregiver present to determine baseline cognitive functioning Area of Impairment: Orientation;Attention;Memory;Following commands;Safety/judgement;Awareness;Problem solving                 Orientation Level: Disoriented to;Place;Time;Situation Current Attention Level: Focused Memory: Decreased short-term memory Following Commands: Follows one step commands inconsistently Safety/Judgement: Decreased awareness of safety;Decreased awareness  of deficits Awareness: Intellectual Problem Solving: Decreased initiation;Requires verbal cues;Slow processing;Difficulty sequencing;Requires  tactile cues General Comments: Oriented to person, poor body awareness especially with regards to incontinence. Pt restless and mobilizing at times despite being told to stand still for pericare, but pt ultimately agreeable and cooperative with repeated cues.      Exercises      General Comments        Pertinent Vitals/Pain Pain Assessment: No/denies pain Pain Intervention(s): Limited activity within patient's tolerance;Monitored during session    Home Living                      Prior Function            PT Goals (current goals can now be found in the care plan section) Acute Rehab PT Goals Patient Stated Goal: pt unable to relate goals PT Goal Formulation: With patient Time For Goal Achievement: 09/20/19 Potential to Achieve Goals: Good Progress towards PT goals: Progressing toward goals    Frequency    Min 2X/week      PT Plan Current plan remains appropriate    Co-evaluation              AM-PAC PT "6 Clicks" Mobility   Outcome Measure  Help needed turning from your back to your side while in a flat bed without using bedrails?: A Little Help needed moving from lying on your back to sitting on the side of a flat bed without using bedrails?: A Little Help needed moving to and from a bed to a chair (including a wheelchair)?: A Little Help needed standing up from a chair using your arms (e.g., wheelchair or bedside chair)?: A Little Help needed to walk in hospital room?: A Little Help needed climbing 3-5 steps with a railing? : A Little 6 Click Score: 18    End of Session Equipment Utilized During Treatment: Gait belt Activity Tolerance: Patient tolerated treatment well;Patient limited by fatigue Patient left: with call bell/phone within reach;in chair;with chair alarm set Nurse Communication: Mobility status PT Visit Diagnosis: Unsteadiness on feet (R26.81);Muscle weakness (generalized) (M62.81)     Time: [id number] PT Time Calculation (min)  (ACUTE ONLY): 44 min  Charges:  $Gait Training: 23-37 mins $Therapeutic Activity: 8-22 mins                     Karma Ganja, PT, DPT   Acute Rehabilitation Department Pager #: [phone]   Otho Bellows 09/15/2019, 12:42 PM

## 2019-09-15 NOTE — TOC Progression Note (Signed)
Transition of Care [facility]) - Progression Note    Patient Details  Name: SADHANA CINTORA MRN: [id number] Date of Birth: [birth year]  Transition of Care [facility] CM/SW [place], [place] Phone Number: 09/15/2019, 10:10 AM  Clinical Narrative:     CSW texted and called patient's grandson [name] again requesting paperwork be sent to [facility] or this CSW, CSW provided e-mail address.   [facility] reports prior paperwork has difference guardian listed, since [name] has now gone through the courts to obtain guardianship all paperwork needs to be updated with his information and sent to them prior to patient being able to be admitted.   [facility], CSW and supervisor Barbette Or have made multiple attempts to reach [facility] yesterday afternoon as well, as intake paperwork was still not received by facility.   CSW has informed supervisor Barbette Or and updated on current discharge barriers.   Expected Discharge Plan: [place] Barriers to Discharge: Continued Medical Work up  Expected Discharge Plan and Services Expected Discharge Plan: [place] arrangements for the past 2 months: [place]                                       Social Determinants of Health (SDOH) Interventions    Readmission Risk Interventions No flowsheet data found.

## 2019-09-15 NOTE — Progress Notes (Signed)
PROGRESS NOTE    Tiffany Charles  WUX:[id number] DOB: [birth year] DOA: 09/01/2019 PCP: [name], MD     Brief Narrative:  Tiffany Charles is an 86 year old female with history of permanent A. fib on Xarelto, COPD, coronary disease, hypertension, chronic diastolic CHF, hyperlipidemia, pulmonary hypertension, skin cancer who presented to the emergency department with complaints of altered mental status, coffee-ground emesis at skilled nursing facility.  Presentation, she was hypoxic, hypotensive, confused needing intubation for airway protection.  INR was more than 10.  CBC showed hemoglobin of 5.5.  She was given Kcentra and blood transfusion.  GI consulted. CT abdomen pelvis on admission no acute GI bleed, large amount of stool at the level of patient's rectum, Patient was admitted under PCCM service.  Patient was extubated on 4/11. EGD deferred due to advanced age/comorbidities.   New events last 24 hours / Subjective: Up in chair, about to work with OT. No complaints on exam this morning.   Assessment & Plan:   Principal Problem:   Hemorrhagic shock [facility]) Active Problems:   Permanent atrial fibrillation   COPD - PFTs in June 2014 with MET test   CAD, RCA BMS '09, 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 low risk Myoview Feb 2013   Chronic anticoagulation- Xarelto   Goals of care, counseling/discussion   Chronic diastolic (congestive) heart failure (HCC)   Weakness   Mild pulmonary hypertension (HCC)   Occipital stroke (HCC)   Acute liver failure   Acute hypoxemic respiratory failure ([place])   Upper GI bleed   Palliative care by specialist   Hemorrhagic shock: Secondary to upper GI bleed.  She was on Xarelto at home.  Reversed with Kcentra, platelets transfusion, status post 4 units of PRBC.  GI was consulted.  EGD deferred due to age/comorbidities.  On Protonix.  Resolved and now hemodynamically stable  Acute hypoxic respiratory failure: On presentation, she was intubated for  airway protection.  Successful extubation on 4/11.  Currently on room air.  Completed antibiotic therapy for aspiration pneumonia  NSTEMI/ischemic cardiomyopathy/acute on chronic systolic CHF exacerbation: Troponin was elevated on presentation.  Echocardiogram showed EF 35 to 40%, regional wall motion abnormalities, RV systolic function severely reduced, severely dilated left atria, right atria moderate MR mild to moderate TR. cardiology was consulted, no intervention planned. Continue lasix 40 mg daily.  A. fib with RVR: Not a candidate for anticoagulation. Continue Lopressor to 50 mg twice a day.    AKI: Resolved  Transaminitis: Most likely secondary to shock liver on presentation versus liver congestion due to CHF.  LFTs trended down.  Hepatitis panel negative.  Dysphagia: Dysphagia 1 diet.  Speech therapy following.  She was treated with Unasyn for aspiration pneumonia.  Hyperlipidemia: Continue Lipitor  Acute metabolic encephalopathy: Multifactorial cause.  Continue supportive care.  History of CVA .  History of dementia diagnosed about 6 to 8 months ago.  Improved  Peripheral vascular disease/chronic left foot wound: S/p Percutaneous transluminal angioplasty and stent placementleftsuperficial femoral artery and popliteal on 3/17. Daily application of small foam dressing for left foot wound  Constipation: Lactulose PRN  Goals of care: DNR/DNI.  Palliative care was consulted.  Recommend outpatient follow-up with palliative care.  PT/OT evaluations performed. SNF recommended. SNF appropriate as the patient has received 3 days of hospital care (or the 3 day period has been waived by the patient's insurance company) and is felt to need rehab services to restore this patient to their prior level of function to achieve  safe transition back to home care. This patient needs rehab services for at least 5 days per week and skilled nursing services daily to facilitate this transition.  Rehab is being requested as the most appropriate d/c option for this patient and is NOT felt to be for custodial care as evidenced by improvement in her mobility with physical therapy.   DVT prophylaxis: SCD Code Status: DNR Family Communication: No family at bedside Disposition Plan:   Status is: Inpatient  Remains inpatient appropriate because:Unsafe d/c plan   Dispo: The patient is from: SNF              Anticipated d/c is to: SNF              Anticipated d/c date is: 1 day              Patient currently is medically stable to d/c.  Awaiting placement and family to complete paperwork.    Consultants:   Cardiology  GI  PCCM  Palliative care   Antimicrobials:  Anti-infectives (From admission, onward)   Start     Dose/Rate Route Frequency Ordered Stop   09/09/19 1800  amoxicillin-clavulanate (AUGMENTIN) 500-125 MG per tablet 500 mg     500 mg Oral 2 times daily 09/09/19 1353 09/12/19 2003   09/06/19 1430  Ampicillin-Sulbactam (UNASYN) 3 g in sodium chloride 0.9 % 100 mL IVPB  Status:  Discontinued     3 g 200 mL/hr over 30 Minutes Intravenous Every 8 hours 09/06/19 1340 09/09/19 1353   09/02/19 2030  ceFEPIme (MAXIPIME) 2 g in sodium chloride 0.9 % 100 mL IVPB  Status:  Discontinued     2 g 200 mL/hr over 30 Minutes Intravenous Every 24 hours 09/01/19 2046 09/03/19 0900   09/01/19 2043  vancomycin variable dose per unstable renal function (pharmacist dosing)  Status:  Discontinued      Does not apply See admin instructions 09/01/19 2046 09/03/19 0900   09/01/19 1945  ceFEPIme (MAXIPIME) 2 g in sodium chloride 0.9 % 100 mL IVPB     2 g 200 mL/hr over 30 Minutes Intravenous  Once 09/01/19 1930 09/01/19 2051   09/01/19 1945  metroNIDAZOLE (FLAGYL) IVPB 500 mg     500 mg 100 mL/hr over 60 Minutes Intravenous  Once 09/01/19 1930 09/02/19 0129   09/01/19 1945  vancomycin (VANCOCIN) IVPB 1000 mg/200 mL premix     1,000 mg 200 mL/hr over 60 Minutes Intravenous  Once  09/01/19 1930 09/02/19 0129       Objective: Vitals:   09/15/19 0442 09/15/19 0841 09/15/19 1043 09/15/19 1209  BP: 137/80 129/74  112/81  Pulse: 77 (!) 105 77 77  Resp: 18 20  17   Temp: 98.6 F (37 C)   97.7 F (36.5 C)  TempSrc:      SpO2: 96% 99%  99%  Weight:      Height:        Intake/Output Summary (Last 24 hours) at 09/15/2019 1217 Last data filed at 09/15/2019 0900 Gross per 24 hour  Intake 700 ml  Output 700 ml  Net 0 ml   Filed Weights   09/13/19 0443 09/14/19 0416 09/15/19 0127  Weight: 54.3 kg 55.5 kg 54.7 kg    Examination: General exam: Appears calm and comfortable  Respiratory system: Clear to auscultation. Respiratory effort normal. Cardiovascular system: S1 & S2 heard, RRR. No pedal edema. Gastrointestinal system: Abdomen is nondistended, soft and nontender. Normal bowel sounds heard. Central nervous system: Alert.  Non focal exam. Speech clear  Extremities: Symmetric in appearance bilaterally  Skin: No rashes, lesions or ulcers on exposed skin  Psychiatry: Stable   Data Reviewed: I have personally reviewed following labs and imaging studies  CBC: Recent Labs  Lab 09/09/19 0443 09/11/19 0315 09/12/19 0510 09/14/19 0532 09/15/19 0608  WBC 14.0* 12.1* 11.2* 10.3 10.3  NEUTROABS 11.1* 8.9* 8.1* 7.4  --   HGB 14.8 14.5 14.5 13.8 14.0  HCT 45.7 44.1 44.8 43.9 44.5  MCV 87.9 87.2 89.6 91.1 91.2  PLT 158 187 188 201 284   Basic Metabolic Panel: Recent Labs  Lab 09/09/19 0443 09/09/19 0443 09/10/19 0414 09/11/19 0315 09/12/19 0510 09/14/19 0532 09/15/19 0608  NA 143   < > 145 142 142 146* 142  K 3.1*   < > 3.6 3.7 3.6 3.1* 3.8  CL 108   < > 108 105 100 102 100  CO2 23   < > 26 28 30  34* 34*  GLUCOSE 98   < > 106* 108* 100* 102* 104*  BUN 15   < > 14 15 15  32* 28*  CREATININE 0.91   < > 0.89 0.84 0.85 0.86 0.85  CALCIUM 8.5*   < > 8.6* 8.3* 8.4* 8.5* 8.5*  MG 1.6*  --  1.8 1.6* 2.0  --   --   PHOS 2.5  --   --   --   --   --   --     < > = values in this interval not displayed.   GFR: Estimated Creatinine Clearance: 40.3 mL/min (by C-G formula based on SCr of 0.85 mg/dL). Liver Function Tests: Recent Labs  Lab 09/09/19 0443 09/10/19 0414 09/11/19 0315 09/12/19 0510  AST 52* 54* 49* 44*  ALT 67* 66* 49* 42  ALKPHOS 66 66 60 58  BILITOT 1.6* 1.4* 1.3* 1.3*  PROT 5.3* 5.5* 5.4* 5.1*  ALBUMIN 2.6* 2.8* 2.7* 2.5*   No results for input(s): LIPASE, AMYLASE in the last 168 hours. Recent Labs  Lab 09/10/19 0414 09/11/19 0315 09/12/19 0510  AMMONIA 43* 53* 40*   Coagulation Profile: No results for input(s): INR, PROTIME in the last 168 hours. Cardiac Enzymes: Recent Labs  Lab 09/10/19 0414  CKTOTAL 65   BNP (last 3 results) No results for input(s): PROBNP in the last 8760 hours. HbA1C: No results for input(s): HGBA1C in the last 72 hours. CBG: Recent Labs  Lab 09/14/19 1636 09/14/19 2021 09/15/19 0015 09/15/19 0445 09/15/19 0733  GLUCAP 145* 112* 101* 96 93   Lipid Profile: No results for input(s): CHOL, HDL, LDLCALC, TRIG, CHOLHDL, LDLDIRECT in the last 72 hours. Thyroid Function Tests: No results for input(s): TSH, T4TOTAL, FREET4, T3FREE, THYROIDAB in the last 72 hours. Anemia Panel: No results for input(s): VITAMINB12, FOLATE, FERRITIN, TIBC, IRON, RETICCTPCT in the last 72 hours. Sepsis Labs: No results for input(s): PROCALCITON, LATICACIDVEN in the last 168 hours.  Recent Results (from the past 240 hour(s))  Urine Culture     Status: None   Collection Time: 09/08/19  4:37 PM   Specimen: Urine, Catheterized  Result Value Ref Range Status   Specimen Description URINE, CATHETERIZED  Final   Special Requests NONE  Final   Culture   Final    NO GROWTH Performed at [facility], [address], [place],  [address]    Report Status 09/09/2019 FINAL  Final  SARS CORONAVIRUS 2 (TAT 6-24 HRS) Nasopharyngeal Nasopharyngeal Swab     Status:  None   Collection Time: 09/08/19  5:27  PM   Specimen: Nasopharyngeal Swab  Result Value Ref Range Status   SARS Coronavirus 2 NEGATIVE NEGATIVE Final    Comment: (NOTE) SARS-CoV-2 target nucleic acids are NOT DETECTED. The SARS-CoV-2 RNA is generally detectable in upper and lower respiratory specimens during the acute phase of infection. Negative results do not preclude SARS-CoV-2 infection, do not rule out co-infections with other pathogens, and should not be used as the sole basis for treatment or other patient management decisions. Negative results must be combined with clinical observations, patient history, and epidemiological information. The expected result is Negative. Fact Sheet for Patients: SugarRoll.be Fact Sheet for Healthcare Providers: https://www.woods-mathews.com/ This test is not yet approved or cleared by the [place] FDA and  has been authorized for detection and/or diagnosis of SARS-CoV-2 by FDA under an Emergency Use Authorization (EUA). This EUA will remain  in effect (meaning this test can be used) for the duration of the COVID-19 declaration under Section 56 4(b)(1) of the Act, 21 U.S.C. section 360bbb-3(b)(1), unless the authorization is terminated or revoked sooner. Performed at [facility], [place] [address], [place], [place] [address]   SARS CORONAVIRUS 2 (TAT 6-24 HRS) Nasopharyngeal Nasopharyngeal Swab     Status: None   Collection Time: 09/11/19  3:58 PM   Specimen: Nasopharyngeal Swab  Result Value Ref Range Status   SARS Coronavirus 2 NEGATIVE NEGATIVE Final    Comment: (NOTE) SARS-CoV-2 target nucleic acids are NOT DETECTED. The SARS-CoV-2 RNA is generally detectable in upper and lower respiratory specimens during the acute phase of infection. Negative results do not preclude SARS-CoV-2 infection, do not rule out co-infections with other pathogens, and should not be used as the sole basis for treatment or other patient management  decisions. Negative results must be combined with clinical observations, patient history, and epidemiological information. The expected result is Negative. Fact Sheet for Patients: SugarRoll.be Fact Sheet for Healthcare Providers: https://www.woods-mathews.com/ This test is not yet approved or cleared by the [place] FDA and  has been authorized for detection and/or diagnosis of SARS-CoV-2 by FDA under an Emergency Use Authorization (EUA). This EUA will remain  in effect (meaning this test can be used) for the duration of the COVID-19 declaration under Section 56 4(b)(1) of the Act, 21 U.S.C. section 360bbb-3(b)(1), unless the authorization is terminated or revoked sooner. Performed at [facility], [place] [address] [place], [place] [address]   SARS CORONAVIRUS 2 (TAT 6-24 HRS) Nasopharyngeal Nasopharyngeal Swab     Status: None   Collection Time: 09/14/19  3:57 PM   Specimen: Nasopharyngeal Swab  Result Value Ref Range Status   SARS Coronavirus 2 NEGATIVE NEGATIVE Final    Comment: (NOTE) SARS-CoV-2 target nucleic acids are NOT DETECTED. The SARS-CoV-2 RNA is generally detectable in upper and lower respiratory specimens during the acute phase of infection. Negative results do not preclude SARS-CoV-2 infection, do not rule out co-infections with other pathogens, and should not be used as the sole basis for treatment or other patient management decisions. Negative results must be combined with clinical observations, patient history, and epidemiological information. The expected result is Negative. Fact Sheet for Patients: SugarRoll.be Fact Sheet for Healthcare Providers: https://www.woods-mathews.com/ This test is not yet approved or cleared by the [place] FDA and  has been authorized for detection and/or diagnosis of SARS-CoV-2 by FDA under an Emergency Use Authorization (EUA). This  EUA will remain  in effect (meaning this test can  be used) for the duration of the COVID-19 declaration under Section 56 4(b)(1) of the Act, 21 U.S.C. section 360bbb-3(b)(1), unless the authorization is terminated or revoked sooner. Performed at [facility], [place] [employer] [address] [place], [place] [address]       Radiology Studies: No results found.    Scheduled Meds: . atorvastatin  20 mg Oral q1800  . chlorhexidine  15 mL Mouth Rinse BID  . feeding supplement (ENSURE ENLIVE)  237 mL Oral BID BM  . folic acid  1 mg Oral Daily  . furosemide  40 mg Oral Daily  . lactulose  10 g Oral BID  . mouth rinse  15 mL Mouth Rinse q12n4p  . metoprolol tartrate  50 mg Oral BID  . multivitamin with minerals  1 tablet Oral Daily  . pantoprazole  40 mg Oral BID  . senna-docusate  1 tablet Oral BID  . tamsulosin  0.4 mg Oral Daily   Continuous Infusions: . sodium chloride 10 mL/hr at 09/08/19 2300     LOS: 14 days      Time spent: 20 minutes   Dessa Phi, DO [facility] 09/15/2019, 12:17 PM   Available via Epic secure chat 7am-7pm After these hours, please refer to coverage provider listed on [email or web address]

## 2019-09-15 NOTE — Progress Notes (Signed)
Occupational Therapy Treatment Patient Details Name: Tiffany Charles MRN: [id number] DOB: 01/09/1930 Today's Date: 09/15/2019    History of present illness 86 year old female with PMH as below, which is significant for Atrial fibrillation on Xarelto, COPD, CAD, and HTN. She was recently admitted to [facility] and treated for cellulitis with keflex and doxycycline. She presented to [facility] ED 4/8 hypoxemic and hypotensive, after being found altered at SNF with coffee-ground emesis beside her bed. Intubated 4/8-4/11.   OT comments  Patient continues to make incremental progress towards goals in skilled OT session. Patient's session encompassed basic ADLs and functional mobility to increase activity tolerance. Pt with increased confusion in session, disoriented and at times combative towards staff. Pt required significant increased time and multi-modal cues to initiate all tasks and due to cognitive status requires total assist to complete BADLs and increasingly complex tasks. Pt would continue to benefit from skilled services in order to progress; will continue to follow acutely.    Follow Up Recommendations  SNF;Supervision/Assistance - 24 hour    Equipment Recommendations  None recommended by OT    Recommendations for Other Services      Precautions / Restrictions Precautions Precautions: Fall Restrictions Weight Bearing Restrictions: No       Mobility Bed Mobility Overal bed mobility: Needs Assistance Bed Mobility: Supine to Sit     Supine to sit: Min assist     General bed mobility comments: minA with max encouragement to get pt to sit EOB, pt able to move BLE and raise trunk with minA and use of bed rails, modA with bed bad to scoot hips  Transfers Overall transfer level: Needs assistance Equipment used: Rolling walker (2 wheeled) Transfers: Sit to/from Stand Sit to Stand: Min assist;Min guard         General transfer comment: min A to power up x2 and unable to clear  hips, then min G to stand on third attempt, multi-modal cues needed for walker managment and hand placement    Balance Overall balance assessment: Needs assistance Sitting-balance support: Feet unsupported;Bilateral upper extremity supported Sitting balance-Leahy Scale: Fair Sitting balance - Comments: intermitent assist to maintain upright sitting   Standing balance support: Bilateral upper extremity supported Standing balance-Leahy Scale: Fair Standing balance comment: able to stand statically at sink for 2 minutes                           ADL either performed or assessed with clinical judgement   ADL Overall ADL's : Needs assistance/impaired     Grooming: Maximal assistance;Wash/dry hands;Standing;Wash/dry face Grooming Details (indicate cue type and reason): Pt requiring max multimodal cues to initiate washing face when standing at sink, after initial guiding with hand over hand assist, pt able to complete                             Functional mobility during ADLs: Rolling walker;Maximal assistance General ADL Comments: Max A to initiate washing face, due to cognitive status, remains total A for BADLs due to cognition, noted to be minimally combative at end of session due to confusion     Vision       Perception     Praxis      Cognition Arousal/Alertness: Awake/alert Behavior During Therapy: Restless;Flat affect;WFL for tasks assessed/performed Overall Cognitive Status: No family/caregiver present to determine baseline cognitive functioning Area of Impairment: Orientation;Attention;Memory;Following commands;Safety/judgement;Awareness;Problem solving  Orientation Level: Disoriented to;Place;Time;Situation Current Attention Level: Focused Memory: Decreased short-term memory Following Commands: Follows one step commands inconsistently Safety/Judgement: Decreased awareness of safety;Decreased awareness of deficits Awareness:  Intellectual Problem Solving: Decreased initiation;Requires verbal cues;Slow processing;Difficulty sequencing;Requires tactile cues General Comments: Pt not oriented in sesion, but would respond to name, required max encouragement to ambulate to sink, extremly impulsive in movement and requiring tactile cues as hips to ambulate safely, pt noted to be minimally combative (raising fist at tech) but was easily redirected        Exercises     Shoulder Instructions       General Comments      Pertinent Vitals/ Pain       Pain Assessment: Faces Faces Pain Scale: No hurt Pain Intervention(s): Limited activity within patient's tolerance;Monitored during session  Home Living                                          Prior Functioning/Environment              Frequency  Min 2X/week        Progress Toward Goals  OT Goals(current goals can now be found in the care plan section)  Progress towards OT goals: Progressing toward goals  Acute Rehab OT Goals Patient Stated Goal: pt unable to state  goals OT Goal Formulation: Patient unable to participate in goal setting Time For Goal Achievement: 09/23/19 Potential to Achieve Goals: [place] Discharge plan remains appropriate    Co-evaluation                 AM-PAC OT "6 Clicks" Daily Activity     Outcome Measure   Help from another person eating meals?: A Lot Help from another person taking care of personal grooming?: A Lot Help from another person toileting, which includes using toliet, bedpan, or urinal?: Total Help from another person bathing (including washing, rinsing, drying)?: Total Help from another person to put on and taking off regular upper body clothing?: Total Help from another person to put on and taking off regular lower body clothing?: Total 6 Click Score: 8    End of Session Equipment Utilized During Treatment: Gait belt;Rolling walker  OT Visit Diagnosis: Unsteadiness on feet  (R26.81);Other abnormalities of gait and mobility (R26.89);Muscle weakness (generalized) (M62.81);Other symptoms and signs involving cognitive function   Activity Tolerance Treatment limited secondary to agitation;Patient limited by fatigue   Patient Left in chair;with call bell/phone within reach;with chair alarm set   Nurse Communication Mobility status        Time: [id number] OT Time Calculation (min): 20 min  Charges: OT General Charges $OT Visit: 1 Visit OT Treatments $Self Care/Home Management : 8-22 mins  Corinne Ports E. Brena Windsor, COTA/L Acute Rehabilitation Services [place] 09/15/2019, 2:03 PM

## 2019-09-15 NOTE — Plan of Care (Signed)
  Problem: Elimination: Goal: Will not experience complications related to bowel motility Outcome: Progressing Goal: Will not experience complications related to urinary retention Outcome: Progressing   

## 2019-09-16 DIAGNOSIS — R578 Other shock: Secondary | ICD-10-CM | POA: Diagnosis not present

## 2019-09-16 DIAGNOSIS — E43 Unspecified severe protein-calorie malnutrition: Secondary | ICD-10-CM | POA: Diagnosis present

## 2019-09-16 LAB — GLUCOSE, CAPILLARY
Glucose-Capillary: 106 mg/dL — ABNORMAL HIGH (ref 70–99)
Glucose-Capillary: 107 mg/dL — ABNORMAL HIGH (ref 70–99)
Glucose-Capillary: 112 mg/dL — ABNORMAL HIGH (ref 70–99)
Glucose-Capillary: 128 mg/dL — ABNORMAL HIGH (ref 70–99)
Glucose-Capillary: 140 mg/dL — ABNORMAL HIGH (ref 70–99)
Glucose-Capillary: 146 mg/dL — ABNORMAL HIGH (ref 70–99)
Glucose-Capillary: 98 mg/dL (ref 70–99)

## 2019-09-16 NOTE — TOC Progression Note (Addendum)
Transition of Care [facility]) - Progression Note    Patient Details  Name: Tiffany Charles MRN: [id number] Date of Birth: June 10, 1929  Transition of Care [facility]) CM/SW [place], [place] Phone Number: 09/16/2019, 10:17 AM  Clinical Narrative:     Update:Judson called CSW back, states he has been on many flights and was trying to find Internet to re send paperwork. CSW confirmed with him he has e-mail address and states he will send today. CSW has still not received any paperwork at this time.   CSW lvm with [name] again, reiterated need for intake paperwork to be sent to [facility] or to CSW and provided email address in vm.   Pending call back. Call has been made to APS for assistance in this case. They are reviewing as of 4/22.   Expected Discharge Plan: [place] Barriers to Discharge: Continued Medical Work up  Expected Discharge Plan and Services Expected Discharge Plan: [place] arrangements for the past 2 months: [place]                                       Social Determinants of Health (SDOH) Interventions    Readmission Risk Interventions No flowsheet data found.

## 2019-09-16 NOTE — Progress Notes (Signed)
PROGRESS NOTE    Tiffany Charles  GHW:[id number] DOB: [birth year] DOA: 09/01/2019 PCP: [name], MD     Brief Narrative:  Tiffany Charles is an 86 year old female with history of permanent A. fib on Xarelto, COPD, coronary disease, hypertension, chronic diastolic CHF, hyperlipidemia, pulmonary hypertension, skin cancer who presented to the emergency department with complaints of altered mental status, coffee-ground emesis at skilled nursing facility.  Presentation, she was hypoxic, hypotensive, confused needing intubation for airway protection.  INR was more than 10.  CBC showed hemoglobin of 5.5.  She was given Kcentra and blood transfusion.  GI consulted. CT abdomen pelvis on admission no acute GI bleed, large amount of stool at the level of patient's rectum, Patient was admitted under PCCM service.  Patient was extubated on 4/11. EGD deferred due to advanced age/comorbidities.   New events last 24 hours / Subjective: No acute events reported last night. Awake in bed, no complaints.   Assessment & Plan:   Principal Problem:   Hemorrhagic shock [facility]) Active Problems:   Permanent atrial fibrillation   COPD - PFTs in June 2014 with MET test   CAD, RCA BMS '09, 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 low risk Myoview Feb 2013   Chronic anticoagulation- Xarelto   Goals of care, counseling/discussion   Chronic diastolic (congestive) heart failure (HCC)   Weakness   Mild pulmonary hypertension (HCC)   Occipital stroke (HCC)   Acute liver failure   Acute hypoxemic respiratory failure ([place])   Upper GI bleed   Palliative care by specialist   Hemorrhagic shock: Secondary to upper GI bleed.  She was on Xarelto at home.  Reversed with Kcentra, platelets transfusion, status post 4 units of PRBC.  GI was consulted.  EGD deferred due to age/comorbidities.  On Protonix.  Resolved and now hemodynamically stable  Acute hypoxic respiratory failure: On presentation, she was intubated for  airway protection.  Successful extubation on 4/11.  Currently on room air.  Completed antibiotic therapy for aspiration pneumonia  NSTEMI/ischemic cardiomyopathy/acute on chronic systolic CHF exacerbation: Troponin was elevated on presentation.  Echocardiogram showed EF 35 to 40%, regional wall motion abnormalities, RV systolic function severely reduced, severely dilated left atria, right atria moderate MR mild to moderate TR. cardiology was consulted, no intervention planned. Continue lasix 40 mg daily.  A. fib with RVR: Not a candidate for anticoagulation. Continue Lopressor to 50 mg twice a day.    AKI: Resolved  Transaminitis: Most likely secondary to shock liver on presentation versus liver congestion due to CHF.  LFTs trended down.  Hepatitis panel negative.  Dysphagia: Dysphagia 1 diet.  Speech therapy following.  She was treated with Unasyn for aspiration pneumonia.  Hyperlipidemia: Continue Lipitor  Acute metabolic encephalopathy: Multifactorial cause.  Continue supportive care.  History of CVA .  History of dementia diagnosed about 6 to 8 months ago.  Improved  Peripheral vascular disease/chronic left foot wound: S/p Percutaneous transluminal angioplasty and stent placementleftsuperficial femoral artery and popliteal on 3/17. Daily application of small foam dressing for left foot wound  Constipation: Lactulose PRN  Goals of care: DNR/DNI.  Palliative care was consulted.  Recommend outpatient follow-up with palliative care.  PT/OT evaluations performed. SNF recommended. SNF appropriate as the patient has received 3 days of hospital care (or the 3 day period has been waived by the patient's insurance company) and is felt to need rehab services to restore this patient to their prior level of function to achieve safe transition back  to home care. This patient needs rehab services for at least 5 days per week and skilled nursing services daily to facilitate this transition.  Rehab is being requested as the most appropriate d/c option for this patient and is NOT felt to be for custodial care as evidenced by improvement in her mobility with physical therapy.   DVT prophylaxis: SCD Code Status: DNR Family Communication: No family at bedside Disposition Plan:   Status is: Inpatient  Remains inpatient appropriate because:Unsafe d/c plan   Dispo: The patient is from: SNF              Anticipated d/c is to: SNF              Anticipated d/c date is: 1 day              Patient currently is medically stable to d/c.  Awaiting placement and family to complete paperwork.    Consultants:   Cardiology  GI  PCCM  Palliative care   Antimicrobials:  Anti-infectives (From admission, onward)   Start     Dose/Rate Route Frequency Ordered Stop   09/09/19 1800  amoxicillin-clavulanate (AUGMENTIN) 500-125 MG per tablet 500 mg     500 mg Oral 2 times daily 09/09/19 1353 09/12/19 2003   09/06/19 1430  Ampicillin-Sulbactam (UNASYN) 3 g in sodium chloride 0.9 % 100 mL IVPB  Status:  Discontinued     3 g 200 mL/hr over 30 Minutes Intravenous Every 8 hours 09/06/19 1340 09/09/19 1353   09/02/19 2030  ceFEPIme (MAXIPIME) 2 g in sodium chloride 0.9 % 100 mL IVPB  Status:  Discontinued     2 g 200 mL/hr over 30 Minutes Intravenous Every 24 hours 09/01/19 2046 09/03/19 0900   09/01/19 2043  vancomycin variable dose per unstable renal function (pharmacist dosing)  Status:  Discontinued      Does not apply See admin instructions 09/01/19 2046 09/03/19 0900   09/01/19 1945  ceFEPIme (MAXIPIME) 2 g in sodium chloride 0.9 % 100 mL IVPB     2 g 200 mL/hr over 30 Minutes Intravenous  Once 09/01/19 1930 09/01/19 2051   09/01/19 1945  metroNIDAZOLE (FLAGYL) IVPB 500 mg     500 mg 100 mL/hr over 60 Minutes Intravenous  Once 09/01/19 1930 09/02/19 0129   09/01/19 1945  vancomycin (VANCOCIN) IVPB 1000 mg/200 mL premix     1,000 mg 200 mL/hr over 60 Minutes Intravenous  Once  09/01/19 1930 09/02/19 0129       Objective: Vitals:   09/15/19 2035 09/16/19 0303 09/16/19 0306 09/16/19 0928  BP: 121/64 (!) 144/72  127/69  Pulse: 94 75  82  Resp: 18 20    Temp: 97.7 F (36.5 C) 98.1 F (36.7 C)    TempSrc: Oral Oral    SpO2: 98% 100%    Weight:   54.6 kg   Height:        Intake/Output Summary (Last 24 hours) at 09/16/2019 1003 Last data filed at 09/16/2019 0806 Gross per 24 hour  Intake 820 ml  Output 400 ml  Net 420 ml   Filed Weights   09/14/19 0416 09/15/19 0127 09/16/19 0306  Weight: 55.5 kg 54.7 kg 54.6 kg    Examination: General exam: Appears calm and comfortable  Respiratory system: Clear to auscultation. Respiratory effort normal. Cardiovascular system: S1 & S2 heard, Irreg rhythm. No pedal edema. Gastrointestinal system: Abdomen is nondistended, soft and nontender. Normal bowel sounds heard. Central nervous system: Alert. Non  focal exam Extremities: Symmetric in appearance bilaterally  Skin: No rashes, lesions or ulcers on exposed skin  Psychiatry: Mood & affect appropriate.    Data Reviewed: I have personally reviewed following labs and imaging studies  CBC: Recent Labs  Lab 09/11/19 0315 09/12/19 0510 09/14/19 0532 09/15/19 0608  WBC 12.1* 11.2* 10.3 10.3  NEUTROABS 8.9* 8.1* 7.4  --   HGB 14.5 14.5 13.8 14.0  HCT 44.1 44.8 43.9 44.5  MCV 87.2 89.6 91.1 91.2  PLT 187 188 201 824   Basic Metabolic Panel: Recent Labs  Lab 09/10/19 0414 09/11/19 0315 09/12/19 0510 09/14/19 0532 09/15/19 0608  NA 145 142 142 146* 142  K 3.6 3.7 3.6 3.1* 3.8  CL 108 105 100 102 100  CO2 26 28 30  34* 34*  GLUCOSE 106* 108* 100* 102* 104*  BUN 14 15 15  32* 28*  CREATININE 0.89 0.84 0.85 0.86 0.85  CALCIUM 8.6* 8.3* 8.4* 8.5* 8.5*  MG 1.8 1.6* 2.0  --   --    GFR: Estimated Creatinine Clearance: 40.2 mL/min (by C-G formula based on SCr of 0.85 mg/dL). Liver Function Tests: Recent Labs  Lab 09/10/19 0414 09/11/19 0315  09/12/19 0510  AST 54* 49* 44*  ALT 66* 49* 42  ALKPHOS 66 60 58  BILITOT 1.4* 1.3* 1.3*  PROT 5.5* 5.4* 5.1*  ALBUMIN 2.8* 2.7* 2.5*   No results for input(s): LIPASE, AMYLASE in the last 168 hours. Recent Labs  Lab 09/10/19 0414 09/11/19 0315 09/12/19 0510  AMMONIA 43* 53* 40*   Coagulation Profile: No results for input(s): INR, PROTIME in the last 168 hours. Cardiac Enzymes: Recent Labs  Lab 09/10/19 0414  CKTOTAL 65   BNP (last 3 results) No results for input(s): PROBNP in the last 8760 hours. HbA1C: No results for input(s): HGBA1C in the last 72 hours. CBG: Recent Labs  Lab 09/15/19 1631 09/15/19 2033 09/16/19 0009 09/16/19 0428 09/16/19 0735  GLUCAP 188* 118* 146* 106* 98   Lipid Profile: No results for input(s): CHOL, HDL, LDLCALC, TRIG, CHOLHDL, LDLDIRECT in the last 72 hours. Thyroid Function Tests: No results for input(s): TSH, T4TOTAL, FREET4, T3FREE, THYROIDAB in the last 72 hours. Anemia Panel: No results for input(s): VITAMINB12, FOLATE, FERRITIN, TIBC, IRON, RETICCTPCT in the last 72 hours. Sepsis Labs: No results for input(s): PROCALCITON, LATICACIDVEN in the last 168 hours.  Recent Results (from the past 240 hour(s))  Urine Culture     Status: None   Collection Time: 09/08/19  4:37 PM   Specimen: Urine, Catheterized  Result Value Ref Range Status   Specimen Description URINE, CATHETERIZED  Final   Special Requests NONE  Final   Culture   Final    NO GROWTH Performed at [facility], [address] [place], [place] [address]    Report Status 09/09/2019 FINAL  Final  SARS CORONAVIRUS 2 (TAT 6-24 HRS) Nasopharyngeal Nasopharyngeal Swab     Status: None   Collection Time: 09/08/19  5:27 PM   Specimen: Nasopharyngeal Swab  Result Value Ref Range Status   SARS Coronavirus 2 NEGATIVE NEGATIVE Final    Comment: (NOTE) SARS-CoV-2 target nucleic acids are NOT DETECTED. The SARS-CoV-2 RNA is generally detectable in upper and  lower respiratory specimens during the acute phase of infection. Negative results do not preclude SARS-CoV-2 infection, do not rule out co-infections with other pathogens, and should not be used as the sole basis for treatment or other patient management decisions. Negative results must be combined with  clinical observations, patient history, and epidemiological information. The expected result is Negative. Fact Sheet for Patients: SugarRoll.be Fact Sheet for Healthcare Providers: https://www.woods-mathews.com/ This test is not yet approved or cleared by the [place] FDA and  has been authorized for detection and/or diagnosis of SARS-CoV-2 by FDA under an Emergency Use Authorization (EUA). This EUA will remain  in effect (meaning this test can be used) for the duration of the COVID-19 declaration under Section 56 4(b)(1) of the Act, 21 U.S.C. section 360bbb-3(b)(1), unless the authorization is terminated or revoked sooner. Performed at [facility], [place] [address] [place], [place] [address]   SARS CORONAVIRUS 2 (TAT 6-24 HRS) Nasopharyngeal Nasopharyngeal Swab     Status: None   Collection Time: 09/11/19  3:58 PM   Specimen: Nasopharyngeal Swab  Result Value Ref Range Status   SARS Coronavirus 2 NEGATIVE NEGATIVE Final    Comment: (NOTE) SARS-CoV-2 target nucleic acids are NOT DETECTED. The SARS-CoV-2 RNA is generally detectable in upper and lower respiratory specimens during the acute phase of infection. Negative results do not preclude SARS-CoV-2 infection, do not rule out co-infections with other pathogens, and should not be used as the sole basis for treatment or other patient management decisions. Negative results must be combined with clinical observations, patient history, and epidemiological information. The expected result is Negative. Fact Sheet for Patients: SugarRoll.be Fact Sheet for  Healthcare Providers: https://www.woods-mathews.com/ This test is not yet approved or cleared by the [place] FDA and  has been authorized for detection and/or diagnosis of SARS-CoV-2 by FDA under an Emergency Use Authorization (EUA). This EUA will remain  in effect (meaning this test can be used) for the duration of the COVID-19 declaration under Section 56 4(b)(1) of the Act, 21 U.S.C. section 360bbb-3(b)(1), unless the authorization is terminated or revoked sooner. Performed at [facility], [place] [address] [place], [place] [address]   SARS CORONAVIRUS 2 (TAT 6-24 HRS) Nasopharyngeal Nasopharyngeal Swab     Status: None   Collection Time: 09/14/19  3:57 PM   Specimen: Nasopharyngeal Swab  Result Value Ref Range Status   SARS Coronavirus 2 NEGATIVE NEGATIVE Final    Comment: (NOTE) SARS-CoV-2 target nucleic acids are NOT DETECTED. The SARS-CoV-2 RNA is generally detectable in upper and lower respiratory specimens during the acute phase of infection. Negative results do not preclude SARS-CoV-2 infection, do not rule out co-infections with other pathogens, and should not be used as the sole basis for treatment or other patient management decisions. Negative results must be combined with clinical observations, patient history, and epidemiological information. The expected result is Negative. Fact Sheet for Patients: SugarRoll.be Fact Sheet for Healthcare Providers: https://www.woods-mathews.com/ This test is not yet approved or cleared by the [place] FDA and  has been authorized for detection and/or diagnosis of SARS-CoV-2 by FDA under an Emergency Use Authorization (EUA). This EUA will remain  in effect (meaning this test can be used) for the duration of the COVID-19 declaration under Section 56 4(b)(1) of the Act, 21 U.S.C. section 360bbb-3(b)(1), unless the authorization is terminated or revoked  sooner. Performed at [facility], [place] [address], [place], [place] [address]       Radiology Studies: No results found.    Scheduled Meds: . atorvastatin  20 mg Oral q1800  . chlorhexidine  15 mL Mouth Rinse BID  . feeding supplement (ENSURE ENLIVE)  237 mL Oral BID BM  . folic acid  1 mg Oral Daily  .  furosemide  40 mg Oral Daily  . mouth rinse  15 mL Mouth Rinse q12n4p  . metoprolol tartrate  50 mg Oral BID  . multivitamin with minerals  1 tablet Oral Daily  . pantoprazole  40 mg Oral BID  . senna-docusate  1 tablet Oral BID  . tamsulosin  0.4 mg Oral Daily   Continuous Infusions: . sodium chloride 10 mL/hr at 09/08/19 2300     LOS: 15 days      Time spent: 20 minutes   Dessa Phi, DO [facility] 09/16/2019, 10:03 AM   Available via Epic secure chat 7am-7pm After these hours, please refer to coverage provider listed on [email or web address]

## 2019-09-16 NOTE — Progress Notes (Signed)
  Speech Language Pathology Treatment: Dysphagia  Patient Details Name: Tiffany Charles MRN: [id number] DOB: 1929/07/04 Today's Date: 09/16/2019 Time: [id number] SLP Time Calculation (min) (ACUTE ONLY): 14 min  Assessment / Plan / Recommendation Clinical Impression  Pt was seen for dysphagia treatment and was cooperative during the session. Pt's nurse reported that the pt has been tolerating the current diet without difficulty. No signs or symptoms of aspiration were noted with dysphagia 2/3 solids. Mastication time was moderately increased with dysphagia 3 solids due to limited dentition but functional with dysphagia 2 solids. A single cough was noted following the initial set of five consecutive swallows of thin liquids via straw; however, no additional signs of aspiration were observed with 10 trials of thin liquids even when consecutive swallows were used. A dysphagia 2 diet with thin liquids is recommended at this time. SLP will follow continue to follow pt.    HPI HPI: 86 year old female with PMH as below, which is significant for Atrial fibrillation on Xarelto, COPD, CAD, and HTN. She was recently admitted to [facility] and treated for cellulitis with keflex and doxycycline. She presented to [facility] ED 4/8 hypoxemic and hypotensive, after being found altered at SNF with coffee-ground emesis beside her bed. Intubated 4/8-4/11.      SLP Plan  Continue with current plan of care       Recommendations  Diet recommendations: Dysphagia 2 (fine chop);Thin liquid Liquids provided via: Cup;Straw Medication Administration: Crushed with puree Supervision: Staff to assist with self feeding Compensations: Slow rate;Small sips/bites Postural Changes and/or Swallow Maneuvers: Seated upright 90 degrees;Upright 30-60 min after meal                Oral Care Recommendations: Oral care BID Follow up Recommendations: Skilled Nursing facility SLP Visit Diagnosis: Dysphagia, unspecified (R13.10) Plan:  Continue with current plan of care       Severino Paolo I. Hardin Negus, [place], [place] Office number [phone] Pager [place] 09/16/2019, 1:54 PM

## 2019-09-16 NOTE — Progress Notes (Signed)
Palliative Medicine RN Note: Our team has been shadowing Tiffany Charles's progress. We discussed her case in team rounds this morning. At this time, GOC are clear, and there is no further role for our team's involvement.   If new palliative-related concerns arise or if her condition declines, please re-consult. Palliative will sign off.  Marjie Skiff Emmauel Hallums, RN, BSN, [facility] Palliative Medicine Team 09/16/2019 10:13 AM Office [phone]

## 2019-09-16 NOTE — Progress Notes (Signed)
Nutrition Follow-up  DOCUMENTATION CODES:   Severe malnutrition in context of chronic illness  INTERVENTION:   -Continue Ensure Enlive po BID, each supplement provides 350 kcal and 20 grams of protein -Continue Magic cup TID with meals, each supplement provides 290 kcal and 9 grams of protein -Continue MVI with minerals daily  NUTRITION DIAGNOSIS:   Severe Malnutrition related to chronic illness(COPD) as evidenced by moderate fat depletion, severe fat depletion, moderate muscle depletion, severe muscle depletion.  Ongoing  GOAL:   Patient will meet greater than or equal to 90% of their needs  Progressing   MONITOR:   PO intake, Supplement acceptance, Weight trends, Skin, Labs, I & O's, Diet advancement  REASON FOR ASSESSMENT:   Ventilator    ASSESSMENT:   86 yo female admitted with hemorrhagic shock due to GI hemorrhage. Required intubation on admission. PMH includes A fib, COPD, CAD, HTN.  4/11- extubated 4/12- s/p BSE- recommend continue NPO 4/13- s/p BSE- advanced to dysphagia 1 diet with nectar thick liquids  Reviewed I/O's: +540 ml x 24 hours and -2.4 L since 09/02/19  UOP: 400 ml x 24 hours  Pt sitting up in bed, pleasantly confused at time of visit. When asked about her appetite and if she ate breakfast today, she replied "I don't know, did you eat breakfast?".   Pt continues to tolerate dysphagia 1 diet with nectar thick liquids well. Noted meal completion 50-100%. Pt also taking Ensure supplements well.   Medications reviewed and include lasix and senokot.   Per [facility] team notes, plan to d/c to SNF once insurance authorization is obtained.   Labs reviewed: 98-146.   NUTRITION - FOCUSED PHYSICAL EXAM:    Most Recent Value  Orbital Region  Moderate depletion  Upper Arm Region  Severe depletion  Thoracic and Lumbar Region  Unable to assess  Buccal Region  Severe depletion  Temple Region  Severe depletion  Clavicle Bone Region  Severe depletion   Clavicle and Acromion Bone Region  Severe depletion  Scapular Bone Region  Severe depletion  Dorsal Hand  Severe depletion  Patellar Region  Moderate depletion  Anterior Thigh Region  Moderate depletion  Posterior Calf Region  Moderate depletion  Edema (RD Assessment)  Mild  Hair  Reviewed  Eyes  Reviewed  Mouth  Reviewed  Skin  Reviewed  Nails  Reviewed       Diet Order:   Diet Order            DIET - DYS 1 Room service appropriate? Yes; Fluid consistency: Nectar Thick  Diet effective now              EDUCATION NEEDS:   No education needs have been identified at this time  Skin:  Skin Assessment: Skin Integrity Issues: Skin Integrity Issues:: Other (Comment) Other: non pressure wound to lt foot with swelling, skin tear to lt arm  Last BM:  09/16/19  Height:   Ht Readings from Last 1 Encounters:  09/01/19 5\' 6"  (1.676 m)    Weight:   Wt Readings from Last 1 Encounters:  09/16/19 54.6 kg    Ideal Body Weight:  59.1 kg  BMI:  Body mass index is 19.43 kg/m.  Estimated Nutritional Needs:   Kcal:  1500-1700  Protein:  75-90 grams  Fluid:  > 1.5 L    Loistine Chance, RD, LDN, [place] Registered Dietitian II Certified Diabetes Care and Education Specialist Please refer to [facility] for RD and/or RD on-call/weekend/after hours pager

## 2019-09-17 DIAGNOSIS — R578 Other shock: Secondary | ICD-10-CM | POA: Diagnosis not present

## 2019-09-17 LAB — GLUCOSE, CAPILLARY
Glucose-Capillary: 101 mg/dL — ABNORMAL HIGH (ref 70–99)
Glucose-Capillary: 106 mg/dL — ABNORMAL HIGH (ref 70–99)
Glucose-Capillary: 129 mg/dL — ABNORMAL HIGH (ref 70–99)
Glucose-Capillary: 81 mg/dL (ref 70–99)

## 2019-09-17 NOTE — Progress Notes (Signed)
[facility] ([facility])  [facility] received referral on 4/16 to help with outpatient palliative care services once she is discharged.  Will continue to follow and assist once she is discharged.  Venia Carbon RN, BSN, [facility] Liaison

## 2019-09-17 NOTE — TOC Progression Note (Signed)
Transition of Care [facility]) - Progression Note    Patient Details  Name: Tiffany Charles MRN: [id number] Date of Birth: 03/24/1930  Transition of Care [facility]) CM/SW [place], [place] Phone Number: 09/17/2019, 2:37 PM  Clinical Narrative:     CSW left a message with grandson [name] to call back regarding paperwork.  CSW will continue to follow.    Expected Discharge Plan: [place] Barriers to Discharge: Continued Medical Work up  Expected Discharge Plan and Services Expected Discharge Plan: [place] arrangements for the past 2 months: [place]                                       Social Determinants of Health (SDOH) Interventions    Readmission Risk Interventions No flowsheet data found.

## 2019-09-17 NOTE — Progress Notes (Signed)
PROGRESS NOTE    Tiffany Charles  ZOX:[id number] DOB: 1929-09-29 DOA: 09/01/2019 PCP: [name], MD     Brief Narrative:  Tiffany Charles is an 86 year old female with history of permanent A. fib on Xarelto, COPD, coronary disease, hypertension, chronic diastolic CHF, hyperlipidemia, pulmonary hypertension, skin cancer who presented to the emergency department with complaints of altered mental status, coffee-ground emesis at skilled nursing facility.  Presentation, she was hypoxic, hypotensive, confused needing intubation for airway protection.  INR was more than 10.  CBC showed hemoglobin of 5.5.  She was given Kcentra and blood transfusion.  GI consulted. CT abdomen pelvis on admission no acute GI bleed, large amount of stool at the level of patient's rectum, Patient was admitted under PCCM service.  Patient was extubated on 4/11. EGD deferred due to advanced age/comorbidities.   New events last 24 hours / Subjective: No new events.   Assessment & Plan:   Principal Problem:   Hemorrhagic shock [facility]) Active Problems:   Permanent atrial fibrillation   COPD - PFTs in June 2014 with MET test   CAD, RCA BMS '09, 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 low risk Myoview Feb 2013   Chronic anticoagulation- Xarelto   Goals of care, counseling/discussion   Chronic diastolic (congestive) heart failure (HCC)   Weakness   Mild pulmonary hypertension (HCC)   Occipital stroke (HCC)   Acute liver failure   Acute hypoxemic respiratory failure (HCC)   Upper GI bleed   Palliative care by specialist   Protein-calorie malnutrition, severe   Hemorrhagic shock: Secondary to upper GI bleed.  She was on Xarelto at home.  Reversed with Kcentra, platelets transfusion, status post 4 units of PRBC.  GI was consulted.  EGD deferred due to age/comorbidities.  On Protonix.  Resolved and now hemodynamically stable  Acute hypoxic respiratory failure: On presentation, she was intubated for airway  protection.  Successful extubation on 4/11.  Currently on room air.  Completed antibiotic therapy for aspiration pneumonia  NSTEMI/ischemic cardiomyopathy/acute on chronic systolic CHF exacerbation: Troponin was elevated on presentation.  Echocardiogram showed EF 35 to 40%, regional wall motion abnormalities, RV systolic function severely reduced, severely dilated left atria, right atria moderate MR mild to moderate TR. Cardiology was consulted, no intervention planned. Continue lasix 40 mg daily.  A. fib with RVR: Not a candidate for anticoagulation. Continue Lopressor.    AKI: Resolved  Transaminitis: Most likely secondary to shock liver on presentation versus liver congestion due to CHF.  LFTs trended down.  Hepatitis panel negative.  Dysphagia: Dysphagia diet.  Speech therapy following.  She was treated with Unasyn for aspiration pneumonia.  Hyperlipidemia: Continue Lipitor  Acute metabolic encephalopathy: Multifactorial cause.  Continue supportive care.  History of CVA .  History of dementia diagnosed about 6 to 8 months ago.  Improved  Peripheral vascular disease/chronic left foot wound: S/p Percutaneous transluminal angioplasty and stent placementleftsuperficial femoral artery and popliteal on 3/17. Daily application of small foam dressing for left foot wound  Constipation: Lactulose PRN  Goals of care: DNR/DNI.  Palliative care was consulted.  Recommend outpatient follow-up with palliative care.  PT/OT evaluations performed. SNF recommended. SNF appropriate as the patient has received 3 days of hospital care (or the 3 day period has been waived by the patient's insurance company) and is felt to need rehab services to restore this patient to their prior level of function to achieve safe transition back to home care. This patient needs rehab services for at  least 5 days per week and skilled nursing services daily to facilitate this transition. Rehab is being requested as the  most appropriate d/c option for this patient and is NOT felt to be for custodial care as evidenced by improvement in her mobility with physical therapy.   DVT prophylaxis: SCD Code Status: DNR Family Communication: No family at bedside Disposition Plan:   Status is: Inpatient  Remains inpatient appropriate because:Unsafe d/c plan   Dispo: The patient is from: SNF              Anticipated d/c is to: SNF              Anticipated d/c date is: 1 day              Patient currently is medically stable to d/c.  Awaiting family to complete paperwork for SNF placement.     Consultants:   Cardiology  GI  PCCM  Palliative care   Antimicrobials:  Anti-infectives (From admission, onward)   Start     Dose/Rate Route Frequency Ordered Stop   09/09/19 1800  amoxicillin-clavulanate (AUGMENTIN) 500-125 MG per tablet 500 mg     500 mg Oral 2 times daily 09/09/19 1353 09/12/19 2003   09/06/19 1430  Ampicillin-Sulbactam (UNASYN) 3 g in sodium chloride 0.9 % 100 mL IVPB  Status:  Discontinued     3 g 200 mL/hr over 30 Minutes Intravenous Every 8 hours 09/06/19 1340 09/09/19 1353   09/02/19 2030  ceFEPIme (MAXIPIME) 2 g in sodium chloride 0.9 % 100 mL IVPB  Status:  Discontinued     2 g 200 mL/hr over 30 Minutes Intravenous Every 24 hours 09/01/19 2046 09/03/19 0900   09/01/19 2043  vancomycin variable dose per unstable renal function (pharmacist dosing)  Status:  Discontinued      Does not apply See admin instructions 09/01/19 2046 09/03/19 0900   09/01/19 1945  ceFEPIme (MAXIPIME) 2 g in sodium chloride 0.9 % 100 mL IVPB     2 g 200 mL/hr over 30 Minutes Intravenous  Once 09/01/19 1930 09/01/19 2051   09/01/19 1945  metroNIDAZOLE (FLAGYL) IVPB 500 mg     500 mg 100 mL/hr over 60 Minutes Intravenous  Once 09/01/19 1930 09/02/19 0129   09/01/19 1945  vancomycin (VANCOCIN) IVPB 1000 mg/200 mL premix     1,000 mg 200 mL/hr over 60 Minutes Intravenous  Once 09/01/19 1930 09/02/19 0129        Objective: Vitals:   09/16/19 2052 09/17/19 0345 09/17/19 0400 09/17/19 0923  BP: 127/63 (!) 110/58  (!) 129/48  Pulse: 88 66  82  Resp: 20 18    Temp: 98.9 F (37.2 C) 97.8 F (36.6 C)    TempSrc: Oral Oral    SpO2: 95% 98%  98%  Weight:   55.7 kg   Height:        Intake/Output Summary (Last 24 hours) at 09/17/2019 0949 Last data filed at 09/17/2019 0400 Gross per 24 hour  Intake 540 ml  Output 825 ml  Net -285 ml   Filed Weights   09/15/19 0127 09/16/19 0306 09/17/19 0400  Weight: 54.7 kg 54.6 kg 55.7 kg    Examination: General exam: Appears calm and comfortable  Respiratory system: Clear to auscultation. Respiratory effort normal. Cardiovascular system: S1 & S2 heard, irregular rhythm. No pedal edema. Gastrointestinal system: Abdomen is nondistended, soft and nontender. Normal bowel sounds heard. Central nervous system: Alert  Extremities: Symmetric in appearance bilaterally  Data Reviewed: I have personally reviewed following labs and imaging studies  CBC: Recent Labs  Lab 09/11/19 0315 09/12/19 0510 09/14/19 0532 09/15/19 0608  WBC 12.1* 11.2* 10.3 10.3  NEUTROABS 8.9* 8.1* 7.4  --   HGB 14.5 14.5 13.8 14.0  HCT 44.1 44.8 43.9 44.5  MCV 87.2 89.6 91.1 91.2  PLT 187 188 201 892   Basic Metabolic Panel: Recent Labs  Lab 09/11/19 0315 09/12/19 0510 09/14/19 0532 09/15/19 0608  NA 142 142 146* 142  K 3.7 3.6 3.1* 3.8  CL 105 100 102 100  CO2 28 30 34* 34*  GLUCOSE 108* 100* 102* 104*  BUN 15 15 32* 28*  CREATININE 0.84 0.85 0.86 0.85  CALCIUM 8.3* 8.4* 8.5* 8.5*  MG 1.6* 2.0  --   --    GFR: Estimated Creatinine Clearance: 41 mL/min (by C-G formula based on SCr of 0.85 mg/dL). Liver Function Tests: Recent Labs  Lab 09/11/19 0315 09/12/19 0510  AST 49* 44*  ALT 49* 42  ALKPHOS 60 58  BILITOT 1.3* 1.3*  PROT 5.4* 5.1*  ALBUMIN 2.7* 2.5*   No results for input(s): LIPASE, AMYLASE in the last 168 hours. Recent Labs  Lab  09/11/19 0315 09/12/19 0510  AMMONIA 53* 40*   Coagulation Profile: No results for input(s): INR, PROTIME in the last 168 hours. Cardiac Enzymes: No results for input(s): CKTOTAL, CKMB, CKMBINDEX, TROPONINI in the last 168 hours. BNP (last 3 results) No results for input(s): PROBNP in the last 8760 hours. HbA1C: No results for input(s): HGBA1C in the last 72 hours. CBG: Recent Labs  Lab 09/16/19 1611 09/16/19 2054 09/16/19 2327 09/17/19 0343 09/17/19 0744  GLUCAP 140* 128* 112* 101* 106*   Lipid Profile: No results for input(s): CHOL, HDL, LDLCALC, TRIG, CHOLHDL, LDLDIRECT in the last 72 hours. Thyroid Function Tests: No results for input(s): TSH, T4TOTAL, FREET4, T3FREE, THYROIDAB in the last 72 hours. Anemia Panel: No results for input(s): VITAMINB12, FOLATE, FERRITIN, TIBC, IRON, RETICCTPCT in the last 72 hours. Sepsis Labs: No results for input(s): PROCALCITON, LATICACIDVEN in the last 168 hours.  Recent Results (from the past 240 hour(s))  Urine Culture     Status: None   Collection Time: 09/08/19  4:37 PM   Specimen: Urine, Catheterized  Result Value Ref Range Status   Specimen Description URINE, CATHETERIZED  Final   Special Requests NONE  Final   Culture   Final    NO GROWTH Performed at [facility], [address], [place], [place] [address]    Report Status 09/09/2019 FINAL  Final  SARS CORONAVIRUS 2 (TAT 6-24 HRS) Nasopharyngeal Nasopharyngeal Swab     Status: None   Collection Time: 09/08/19  5:27 PM   Specimen: Nasopharyngeal Swab  Result Value Ref Range Status   SARS Coronavirus 2 NEGATIVE NEGATIVE Final    Comment: (NOTE) SARS-CoV-2 target nucleic acids are NOT DETECTED. The SARS-CoV-2 RNA is generally detectable in upper and lower respiratory specimens during the acute phase of infection. Negative results do not preclude SARS-CoV-2 infection, do not rule out co-infections with other pathogens, and should not be used as the sole basis for  treatment or other patient management decisions. Negative results must be combined with clinical observations, patient history, and epidemiological information. The expected result is Negative. Fact Sheet for Patients: SugarRoll.be Fact Sheet for Healthcare Providers: https://www.woods-mathews.com/ This test is not yet approved or cleared by the [place] FDA and  has been authorized for detection and/or diagnosis of  SARS-CoV-2 by FDA under an Emergency Use Authorization (EUA). This EUA will remain  in effect (meaning this test can be used) for the duration of the COVID-19 declaration under Section 56 4(b)(1) of the Act, 21 U.S.C. section 360bbb-3(b)(1), unless the authorization is terminated or revoked sooner. Performed at [facility], [place] [address] [place], [place] [address]   SARS CORONAVIRUS 2 (TAT 6-24 HRS) Nasopharyngeal Nasopharyngeal Swab     Status: None   Collection Time: 09/11/19  3:58 PM   Specimen: Nasopharyngeal Swab  Result Value Ref Range Status   SARS Coronavirus 2 NEGATIVE NEGATIVE Final    Comment: (NOTE) SARS-CoV-2 target nucleic acids are NOT DETECTED. The SARS-CoV-2 RNA is generally detectable in upper and lower respiratory specimens during the acute phase of infection. Negative results do not preclude SARS-CoV-2 infection, do not rule out co-infections with other pathogens, and should not be used as the sole basis for treatment or other patient management decisions. Negative results must be combined with clinical observations, patient history, and epidemiological information. The expected result is Negative. Fact Sheet for Patients: SugarRoll.be Fact Sheet for Healthcare Providers: https://www.woods-mathews.com/ This test is not yet approved or cleared by the [place] FDA and  has been authorized for detection and/or diagnosis of SARS-CoV-2 by FDA under an  Emergency Use Authorization (EUA). This EUA will remain  in effect (meaning this test can be used) for the duration of the COVID-19 declaration under Section 56 4(b)(1) of the Act, 21 U.S.C. section 360bbb-3(b)(1), unless the authorization is terminated or revoked sooner. Performed at [facility], [place] [address] [place], [place] [address]   SARS CORONAVIRUS 2 (TAT 6-24 HRS) Nasopharyngeal Nasopharyngeal Swab     Status: None   Collection Time: 09/14/19  3:57 PM   Specimen: Nasopharyngeal Swab  Result Value Ref Range Status   SARS Coronavirus 2 NEGATIVE NEGATIVE Final    Comment: (NOTE) SARS-CoV-2 target nucleic acids are NOT DETECTED. The SARS-CoV-2 RNA is generally detectable in upper and lower respiratory specimens during the acute phase of infection. Negative results do not preclude SARS-CoV-2 infection, do not rule out co-infections with other pathogens, and should not be used as the sole basis for treatment or other patient management decisions. Negative results must be combined with clinical observations, patient history, and epidemiological information. The expected result is Negative. Fact Sheet for Patients: SugarRoll.be Fact Sheet for Healthcare Providers: https://www.woods-mathews.com/ This test is not yet approved or cleared by the [place] FDA and  has been authorized for detection and/or diagnosis of SARS-CoV-2 by FDA under an Emergency Use Authorization (EUA). This EUA will remain  in effect (meaning this test can be used) for the duration of the COVID-19 declaration under Section 56 4(b)(1) of the Act, 21 U.S.C. section 360bbb-3(b)(1), unless the authorization is terminated or revoked sooner. Performed at [facility], [place] [address], [place],  [address]       Radiology Studies: No results found.    Scheduled Meds: . atorvastatin  20 mg Oral q1800  . chlorhexidine  15 mL Mouth Rinse BID   . feeding supplement (ENSURE ENLIVE)  237 mL Oral BID BM  . folic acid  1 mg Oral Daily  . furosemide  40 mg Oral Daily  . mouth rinse  15 mL Mouth Rinse q12n4p  . metoprolol tartrate  50 mg Oral BID  . multivitamin with minerals  1 tablet Oral Daily  . pantoprazole  40 mg Oral BID  . senna-docusate  1 tablet Oral BID  . tamsulosin  0.4 mg Oral Daily   Continuous Infusions: . sodium chloride 10 mL/hr at 09/08/19 2300     LOS: 16 days      Time spent: 70mnutes   JDessa Phi DO [facility] 09/17/2019, 9:49 AM   Available via Epic secure chat 7am-7pm After these hours, please refer to coverage provider listed on [email or web address]

## 2019-09-18 DIAGNOSIS — R578 Other shock: Secondary | ICD-10-CM | POA: Diagnosis not present

## 2019-09-18 NOTE — Progress Notes (Signed)
Patient has no Periferal IV. MD aware and ok without.

## 2019-09-18 NOTE — Progress Notes (Signed)
PROGRESS NOTE    Tiffany Charles  KXF:[id number] DOB: [birth year] DOA: 09/01/2019 PCP: [name], MD     Brief Narrative:  Tiffany Charles is an 86 year old female with history of permanent A. fib on Xarelto, COPD, coronary disease, hypertension, chronic diastolic CHF, hyperlipidemia, pulmonary hypertension, skin cancer who presented to the emergency department with complaints of altered mental status, coffee-ground emesis at skilled nursing facility.  Presentation, she was hypoxic, hypotensive, confused needing intubation for airway protection.  INR was more than 10.  CBC showed hemoglobin of 5.5.  She was given Kcentra and blood transfusion.  GI consulted. CT abdomen pelvis on admission no acute GI bleed, large amount of stool at the level of patient's rectum, Patient was admitted under PCCM service.  Patient was extubated on 4/11. EGD deferred due to advanced age/comorbidities.   New events last 24 hours / Subjective: Sleeping comfortably, no new events reported overnight.  Still awaiting family to fill out paperwork for SNF placement.  Assessment & Plan:   Principal Problem:   Hemorrhagic shock [facility]) Active Problems:   Permanent atrial fibrillation   COPD - PFTs in June 2014 with MET test   CAD, RCA BMS '09, 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 low risk Myoview Feb 2013   Chronic anticoagulation- Xarelto   Goals of care, counseling/discussion   Chronic diastolic (congestive) heart failure (HCC)   Weakness   Mild pulmonary hypertension (HCC)   Occipital stroke (HCC)   Acute liver failure   Acute hypoxemic respiratory failure (HCC)   Upper GI bleed   Palliative care by specialist   Protein-calorie malnutrition, severe   Hemorrhagic shock: Secondary to upper GI bleed.  She was on Xarelto at home.  Reversed with Kcentra, platelets transfusion, status post 4 units of PRBC.  GI was consulted.  EGD deferred due to age/comorbidities.  On Protonix.  Resolved and now  hemodynamically stable  Acute hypoxic respiratory failure: On presentation, she was intubated for airway protection.  Successful extubation on 4/11.  Currently on room air.  Completed antibiotic therapy for aspiration pneumonia  NSTEMI/ischemic cardiomyopathy/acute on chronic systolic CHF exacerbation: Troponin was elevated on presentation.  Echocardiogram showed EF 35 to 40%, regional wall motion abnormalities, RV systolic function severely reduced, severely dilated left atria, right atria moderate MR mild to moderate TR. Cardiology was consulted, no intervention planned. Continue lasix 40 mg daily.  A. fib with RVR: Not a candidate for anticoagulation. Continue Lopressor.    AKI: Resolved  Transaminitis: Most likely secondary to shock liver on presentation versus liver congestion due to CHF.  LFTs trended down.  Hepatitis panel negative.  Dysphagia: Dysphagia diet.  Speech therapy following.  She was treated with Unasyn for aspiration pneumonia.  Hyperlipidemia: Continue Lipitor  Acute metabolic encephalopathy: Multifactorial cause.  Continue supportive care.  History of CVA .  History of dementia diagnosed about 6 to 8 months ago.  Improved  Peripheral vascular disease/chronic left foot wound: S/p Percutaneous transluminal angioplasty and stent placementleftsuperficial femoral artery and popliteal on 3/17. Daily application of small foam dressing for left foot wound  Constipation: Lactulose PRN  Goals of care: DNR/DNI.  Palliative care was consulted.  Recommend outpatient follow-up with palliative care.  PT/OT evaluations performed. SNF recommended. SNF appropriate as the patient has received 3 days of hospital care (or the 3 day period has been waived by the patient's insurance company) and is felt to need rehab services to restore this patient to their prior level of function to  achieve safe transition back to home care. This patient needs rehab services for at least 5 days  per week and skilled nursing services daily to facilitate this transition. Rehab is being requested as the most appropriate d/c option for this patient and is NOT felt to be for custodial care as evidenced by improvement in her mobility with physical therapy.   DVT prophylaxis: SCD Code Status: DNR Family Communication: No family at bedside Disposition Plan:   Status is: Inpatient  Remains inpatient appropriate because:Unsafe d/c plan   Dispo: The patient is from: SNF              Anticipated d/c is to: SNF              Anticipated d/c date is: 1 day              Patient currently is medically stable to d/c.  Awaiting family to complete paperwork for SNF placement.     Consultants:   Cardiology  GI  PCCM  Palliative care   Antimicrobials:  Anti-infectives (From admission, onward)   Start     Dose/Rate Route Frequency Ordered Stop   09/09/19 1800  amoxicillin-clavulanate (AUGMENTIN) 500-125 MG per tablet 500 mg     500 mg Oral 2 times daily 09/09/19 1353 09/12/19 2003   09/06/19 1430  Ampicillin-Sulbactam (UNASYN) 3 g in sodium chloride 0.9 % 100 mL IVPB  Status:  Discontinued     3 g 200 mL/hr over 30 Minutes Intravenous Every 8 hours 09/06/19 1340 09/09/19 1353   09/02/19 2030  ceFEPIme (MAXIPIME) 2 g in sodium chloride 0.9 % 100 mL IVPB  Status:  Discontinued     2 g 200 mL/hr over 30 Minutes Intravenous Every 24 hours 09/01/19 2046 09/03/19 0900   09/01/19 2043  vancomycin variable dose per unstable renal function (pharmacist dosing)  Status:  Discontinued      Does not apply See admin instructions 09/01/19 2046 09/03/19 0900   09/01/19 1945  ceFEPIme (MAXIPIME) 2 g in sodium chloride 0.9 % 100 mL IVPB     2 g 200 mL/hr over 30 Minutes Intravenous  Once 09/01/19 1930 09/01/19 2051   09/01/19 1945  metroNIDAZOLE (FLAGYL) IVPB 500 mg     500 mg 100 mL/hr over 60 Minutes Intravenous  Once 09/01/19 1930 09/02/19 0129   09/01/19 1945  vancomycin (VANCOCIN) IVPB 1000  mg/200 mL premix     1,000 mg 200 mL/hr over 60 Minutes Intravenous  Once 09/01/19 1930 09/02/19 0129       Objective: Vitals:   09/17/19 0923 09/17/19 1124 09/17/19 1947 09/18/19 0435  BP: (!) 129/48 108/72 125/68 128/68  Pulse: 82 82 90 77  Resp:  18 18 18   Temp:  97.7 F (36.5 C) 98.3 F (36.8 C) 97.6 F (36.4 C)  TempSrc:  Oral Oral Oral  SpO2: 98% 99% 98% 97%  Weight:    55.4 kg  Height:        Intake/Output Summary (Last 24 hours) at 09/18/2019 0947 Last data filed at 09/18/2019 0600 Gross per 24 hour  Intake 460 ml  Output 250 ml  Net 210 ml   Filed Weights   09/16/19 0306 09/17/19 0400 09/18/19 0435  Weight: 54.6 kg 55.7 kg 55.4 kg   Examination: General exam: Appears calm and comfortable   Data Reviewed: I have personally reviewed following labs and imaging studies  CBC: Recent Labs  Lab 09/12/19 0510 09/14/19 0532 09/15/19 0608  WBC 11.2* 10.3  10.3  NEUTROABS 8.1* 7.4  --   HGB 14.5 13.8 14.0  HCT 44.8 43.9 44.5  MCV 89.6 91.1 91.2  PLT 188 201 992   Basic Metabolic Panel: Recent Labs  Lab 09/12/19 0510 09/14/19 0532 09/15/19 0608  NA 142 146* 142  K 3.6 3.1* 3.8  CL 100 102 100  CO2 30 34* 34*  GLUCOSE 100* 102* 104*  BUN 15 32* 28*  CREATININE 0.85 0.86 0.85  CALCIUM 8.4* 8.5* 8.5*  MG 2.0  --   --    GFR: Estimated Creatinine Clearance: 40.8 mL/min (by C-G formula based on SCr of 0.85 mg/dL). Liver Function Tests: Recent Labs  Lab 09/12/19 0510  AST 44*  ALT 42  ALKPHOS 58  BILITOT 1.3*  PROT 5.1*  ALBUMIN 2.5*   No results for input(s): LIPASE, AMYLASE in the last 168 hours. Recent Labs  Lab 09/12/19 0510  AMMONIA 40*   Coagulation Profile: No results for input(s): INR, PROTIME in the last 168 hours. Cardiac Enzymes: No results for input(s): CKTOTAL, CKMB, CKMBINDEX, TROPONINI in the last 168 hours. BNP (last 3 results) No results for input(s): PROBNP in the last 8760 hours. HbA1C: No results for input(s):  HGBA1C in the last 72 hours. CBG: Recent Labs  Lab 09/16/19 2327 09/17/19 0343 09/17/19 0744 09/17/19 1121 09/17/19 1734  GLUCAP 112* 101* 106* 129* 81   Lipid Profile: No results for input(s): CHOL, HDL, LDLCALC, TRIG, CHOLHDL, LDLDIRECT in the last 72 hours. Thyroid Function Tests: No results for input(s): TSH, T4TOTAL, FREET4, T3FREE, THYROIDAB in the last 72 hours. Anemia Panel: No results for input(s): VITAMINB12, FOLATE, FERRITIN, TIBC, IRON, RETICCTPCT in the last 72 hours. Sepsis Labs: No results for input(s): PROCALCITON, LATICACIDVEN in the last 168 hours.  Recent Results (from the past 240 hour(s))  Urine Culture     Status: None   Collection Time: 09/08/19  4:37 PM   Specimen: Urine, Catheterized  Result Value Ref Range Status   Specimen Description URINE, CATHETERIZED  Final   Special Requests NONE  Final   Culture   Final    NO GROWTH Performed at [facility], [address] [place], [place] [address]    Report Status 09/09/2019 FINAL  Final  SARS CORONAVIRUS 2 (TAT 6-24 HRS) Nasopharyngeal Nasopharyngeal Swab     Status: None   Collection Time: 09/08/19  5:27 PM   Specimen: Nasopharyngeal Swab  Result Value Ref Range Status   SARS Coronavirus 2 NEGATIVE NEGATIVE Final    Comment: (NOTE) SARS-CoV-2 target nucleic acids are NOT DETECTED. The SARS-CoV-2 RNA is generally detectable in upper and lower respiratory specimens during the acute phase of infection. Negative results do not preclude SARS-CoV-2 infection, do not rule out co-infections with other pathogens, and should not be used as the sole basis for treatment or other patient management decisions. Negative results must be combined with clinical observations, patient history, and epidemiological information. The expected result is Negative. Fact Sheet for Patients: SugarRoll.be Fact Sheet for Healthcare Providers: https://www.woods-mathews.com/ This  test is not yet approved or cleared by the [place] FDA and  has been authorized for detection and/or diagnosis of SARS-CoV-2 by FDA under an Emergency Use Authorization (EUA). This EUA will remain  in effect (meaning this test can be used) for the duration of the COVID-19 declaration under Section 56 4(b)(1) of the Act, 21 U.S.C. section 360bbb-3(b)(1), unless the authorization is terminated or revoked sooner. Performed at [facility], [place]  [address], [place], [place] [address]   SARS CORONAVIRUS 2 (TAT 6-24 HRS) Nasopharyngeal Nasopharyngeal Swab     Status: None   Collection Time: 09/11/19  3:58 PM   Specimen: Nasopharyngeal Swab  Result Value Ref Range Status   SARS Coronavirus 2 NEGATIVE NEGATIVE Final    Comment: (NOTE) SARS-CoV-2 target nucleic acids are NOT DETECTED. The SARS-CoV-2 RNA is generally detectable in upper and lower respiratory specimens during the acute phase of infection. Negative results do not preclude SARS-CoV-2 infection, do not rule out co-infections with other pathogens, and should not be used as the sole basis for treatment or other patient management decisions. Negative results must be combined with clinical observations, patient history, and epidemiological information. The expected result is Negative. Fact Sheet for Patients: SugarRoll.be Fact Sheet for Healthcare Providers: https://www.woods-mathews.com/ This test is not yet approved or cleared by the [place] FDA and  has been authorized for detection and/or diagnosis of SARS-CoV-2 by FDA under an Emergency Use Authorization (EUA). This EUA will remain  in effect (meaning this test can be used) for the duration of the COVID-19 declaration under Section 56 4(b)(1) of the Act, 21 U.S.C. section 360bbb-3(b)(1), unless the authorization is terminated or revoked sooner. Performed at [facility], [place] [address] [place],  [place] [address]   SARS CORONAVIRUS 2 (TAT 6-24 HRS) Nasopharyngeal Nasopharyngeal Swab     Status: None   Collection Time: 09/14/19  3:57 PM   Specimen: Nasopharyngeal Swab  Result Value Ref Range Status   SARS Coronavirus 2 NEGATIVE NEGATIVE Final    Comment: (NOTE) SARS-CoV-2 target nucleic acids are NOT DETECTED. The SARS-CoV-2 RNA is generally detectable in upper and lower respiratory specimens during the acute phase of infection. Negative results do not preclude SARS-CoV-2 infection, do not rule out co-infections with other pathogens, and should not be used as the sole basis for treatment or other patient management decisions. Negative results must be combined with clinical observations, patient history, and epidemiological information. The expected result is Negative. Fact Sheet for Patients: SugarRoll.be Fact Sheet for Healthcare Providers: https://www.woods-mathews.com/ This test is not yet approved or cleared by the [place] FDA and  has been authorized for detection and/or diagnosis of SARS-CoV-2 by FDA under an Emergency Use Authorization (EUA). This EUA will remain  in effect (meaning this test can be used) for the duration of the COVID-19 declaration under Section 56 4(b)(1) of the Act, 21 U.S.C. section 360bbb-3(b)(1), unless the authorization is terminated or revoked sooner. Performed at [facility], [place] [address] [place], [place] [address]       Radiology Studies: No results found.    Scheduled Meds: . atorvastatin  20 mg Oral q1800  . chlorhexidine  15 mL Mouth Rinse BID  . feeding supplement (ENSURE ENLIVE)  237 mL Oral BID BM  . folic acid  1 mg Oral Daily  . furosemide  40 mg Oral Daily  . mouth rinse  15 mL Mouth Rinse q12n4p  . metoprolol tartrate  50 mg Oral BID  . multivitamin with minerals  1 tablet Oral Daily  . pantoprazole  40 mg Oral BID  . senna-docusate  1 tablet Oral BID  . tamsulosin   0.4 mg Oral Daily   Continuous Infusions: . sodium chloride 10 mL/hr at 09/08/19 2300     LOS: 17 days      Time spent: 12mnutes   Nekeshia Lenhardt, DO [facility] 09/18/2019, 9:47 AM   Available via Epic secure chat  7am-7pm After these hours, please refer to coverage provider listed on [email or web address]

## 2019-09-18 NOTE — Progress Notes (Signed)
  Speech Language Pathology Treatment: Dysphagia  Patient Details Name: Tiffany Charles MRN: [id number] DOB: March 28, 1930 Today's Date: 09/18/2019 Time: 3846-6599 SLP Time Calculation (min) (ACUTE ONLY): 10 min  Assessment / Plan / Recommendation Clinical Impression  Pt is doing very well on current diet of dysphagia 2, thin liquids.  Observed with consumption of those consistencies - pt demonstrates slow but functional mastication, brisk swallow, no s/s of aspiration.  RN reports good intake.  She is alert and interactive.  BS are clear. Recommend continuing this diet at D/C, give meds whole in puree, intermittently supervise at meals after tray set-up is provided.  No further SLP f/u is needed. Our service will sign off.   HPI HPI: 86 year old female with PMH as below, which is significant for Atrial fibrillation on Xarelto, COPD, CAD, and HTN. She was recently admitted to [facility] and treated for cellulitis with keflex and doxycycline. She presented to [facility] ED 4/8 hypoxemic and hypotensive, after being found altered at SNF with coffee-ground emesis beside her bed. Intubated 4/8-4/11.      SLP Plan  All goals met       Recommendations  Diet recommendations: Thin liquid;Dysphagia 2 (fine chop) Liquids provided via: Cup;Straw Medication Administration: Whole meds with puree Supervision: Staff to assist with self feeding Compensations: Slow rate;Small sips/bites Postural Changes and/or Swallow Maneuvers: Seated upright 90 degrees;Upright 30-60 min after meal                Oral Care Recommendations: Oral care BID Follow up Recommendations: Skilled Nursing facility SLP Visit Diagnosis: Dysphagia, unspecified (R13.10) Plan: All goals met       GO                Juan Quam Laurice 09/18/2019, 11:25 AM  Estill Bamberg L. Tivis Ringer, [place] Office number [phone] Pager [phone]

## 2019-09-19 ENCOUNTER — Encounter (HOSPITAL_BASED_OUTPATIENT_CLINIC_OR_DEPARTMENT_OTHER): Payer: Medicare HMO | Admitting: Internal Medicine

## 2019-09-19 DIAGNOSIS — R578 Other shock: Secondary | ICD-10-CM | POA: Diagnosis not present

## 2019-09-19 NOTE — Plan of Care (Signed)
  Problem: Education: Goal: Knowledge of General Education information will improve Description: Including pain rating scale, medication(s)/side effects and non-pharmacologic comfort measures Outcome: Progressing   Problem: Health Behavior/Discharge Planning: Goal: Ability to manage health-related needs will improve Outcome: Progressing   Problem: Clinical Measurements: Goal: Ability to maintain clinical measurements within normal limits will improve Outcome: Progressing   Problem: Clinical Measurements: Goal: Cardiovascular complication will be avoided Outcome: Progressing   Problem: Nutrition: Goal: Adequate nutrition will be maintained Outcome: Progressing   Problem: Safety: Goal: Ability to remain free from injury will improve Outcome: Progressing   

## 2019-09-19 NOTE — TOC Progression Note (Signed)
Transition of Care [facility]) - Progression Note    Patient Details  Name: Tiffany Charles MRN: [id number] Date of Birth: 1929-08-13  Transition of [facility] CM/SW Contact  Vihaan Gloss, Francetta Found, LCSW Phone Number: 09/19/2019, 4:17 PM  Clinical Narrative:     Transition of Care Supervisor also attempted to reach patient son [name]) and grandson [name]) who are patient guardians and paperwork has been validated.  TOC team has attempted to reach family without success for almost 6 days.  TOC Supervisor contacted [employer] [facility] [employer] and filed an abandonment report as patient is medically stable for discharge and has been since 04/20.  Patient family initially stating that they had completed all necessary paperwork for the facility, however the paperwork has not yet been completed.  The patient family has now not returned calls, texts, or emails in regards to patient care and discharge needs for many days.  Patient remains medically stable and now insurance authorization for placement has expired.  TOC supervisor has relayed the urgent need for decision making and discharge on patient behalf with APS - will continue to follow up and keep TOC team, MD and unit staff up to date of any information received.  TOC supervisor available as needed for support and to continue to assist with patient discharge needs.     Expected Discharge Plan: [place] Barriers to Discharge: Continued Medical Work up  Expected Discharge Plan and Services Expected Discharge Plan: [place] arrangements for the past 2 months: [place]                                       Social Determinants of Health (SDOH) Interventions    Readmission Risk Interventions No flowsheet data found.   Barbette Or, LCSW [facility] Supervisor [phone]

## 2019-09-19 NOTE — Progress Notes (Signed)
Left foot dressing changed.

## 2019-09-19 NOTE — Progress Notes (Signed)
PROGRESS NOTE    Tiffany Charles  VQM:[id number] DOB: 1930/05/15 DOA: 09/01/2019 PCP: [name], MD     Brief Narrative:  Tiffany Charles is an 86 year old female with history of permanent A. fib on Xarelto, COPD, coronary disease, hypertension, chronic diastolic CHF, hyperlipidemia, pulmonary hypertension, skin cancer who presented to the emergency department with complaints of altered mental status, coffee-ground emesis at skilled nursing facility.  Presentation, she was hypoxic, hypotensive, confused needing intubation for airway protection.  INR was more than 10.  CBC showed hemoglobin of 5.5.  She was given Kcentra and blood transfusion.  GI consulted. CT abdomen pelvis on admission no acute GI bleed, large amount of stool at the level of patient's rectum, Patient was admitted under PCCM service.  Patient was extubated on 4/11. EGD deferred due to advanced age/comorbidities.   New events last 24 hours / Subjective: Pleasantly confused, no complaints   Assessment & Plan:   Principal Problem:   Hemorrhagic shock ([place]) Active Problems:   Permanent atrial fibrillation   COPD - PFTs in June 2014 with MET test   CAD, RCA BMS '09, 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 low risk Myoview Feb 2013   Chronic anticoagulation- Xarelto   Goals of care, counseling/discussion   Chronic diastolic (congestive) heart failure (HCC)   Weakness   Mild pulmonary hypertension (HCC)   Occipital stroke (HCC)   Acute liver failure   Acute hypoxemic respiratory failure (HCC)   Upper GI bleed   Palliative care by specialist   Protein-calorie malnutrition, severe   Hemorrhagic shock: Secondary to upper GI bleed.  She was on Xarelto at home.  Reversed with Kcentra, platelets transfusion, status post 4 units of PRBC.  GI was consulted.  EGD deferred due to age/comorbidities.  On Protonix.  Resolved and now hemodynamically stable  Acute hypoxic respiratory failure: On presentation, she was intubated  for airway protection.  Successful extubation on 4/11.  Currently on room air.  Completed antibiotic therapy for aspiration pneumonia  NSTEMI/ischemic cardiomyopathy/acute on chronic systolic CHF exacerbation: Troponin was elevated on presentation.  Echocardiogram showed EF 35 to 40%, regional wall motion abnormalities, RV systolic function severely reduced, severely dilated left atria, right atria moderate MR mild to moderate TR. Cardiology was consulted, no intervention planned. Continue lasix 40 mg daily.  A. fib with RVR: Not a candidate for anticoagulation. Continue Lopressor.    AKI: Resolved  Transaminitis: Most likely secondary to shock liver on presentation versus liver congestion due to CHF.  LFTs trended down.  Hepatitis panel negative.  Dysphagia: Dysphagia diet.  Speech therapy following.  She was treated with Unasyn for aspiration pneumonia.  Hyperlipidemia: Continue Lipitor  Acute metabolic encephalopathy: Multifactorial cause.  Continue supportive care.  History of CVA .  History of dementia diagnosed about 6 to 8 months ago.  Improved  Peripheral vascular disease/chronic left foot wound: S/p Percutaneous transluminal angioplasty and stent placementleftsuperficial femoral artery and popliteal on 3/17. Daily application of small foam dressing for left foot wound  Constipation: Lactulose PRN  Goals of care: DNR/DNI.  Palliative care was consulted.  Recommend outpatient follow-up with palliative care.  PT/OT evaluations performed. SNF recommended. SNF appropriate as the patient has received 3 days of hospital care (or the 3 day period has been waived by the patient's insurance company) and is felt to need rehab services to restore this patient to their prior level of function to achieve safe transition back to home care. This patient needs rehab services for  at least 5 days per week and skilled nursing services daily to facilitate this transition. Rehab is being  requested as the most appropriate d/c option for this patient and is NOT felt to be for custodial care as evidenced by improvement in her mobility with physical therapy.   DVT prophylaxis: SCD Code Status: DNR Family Communication: No family at bedside Disposition Plan:   Status is: Inpatient  Remains inpatient appropriate because:Unsafe d/c plan   Dispo: The patient is from: SNF              Anticipated d/c is to: SNF              Anticipated d/c date is: 1 day              Patient currently is medically stable to d/c.  Awaiting family to complete paperwork for SNF placement.     Consultants:   Cardiology  GI  PCCM  Palliative care   Antimicrobials:  Anti-infectives (From admission, onward)   Start     Dose/Rate Route Frequency Ordered Stop   09/09/19 1800  amoxicillin-clavulanate (AUGMENTIN) 500-125 MG per tablet 500 mg     500 mg Oral 2 times daily 09/09/19 1353 09/12/19 2003   09/06/19 1430  Ampicillin-Sulbactam (UNASYN) 3 g in sodium chloride 0.9 % 100 mL IVPB  Status:  Discontinued     3 g 200 mL/hr over 30 Minutes Intravenous Every 8 hours 09/06/19 1340 09/09/19 1353   09/02/19 2030  ceFEPIme (MAXIPIME) 2 g in sodium chloride 0.9 % 100 mL IVPB  Status:  Discontinued     2 g 200 mL/hr over 30 Minutes Intravenous Every 24 hours 09/01/19 2046 09/03/19 0900   09/01/19 2043  vancomycin variable dose per unstable renal function (pharmacist dosing)  Status:  Discontinued      Does not apply See admin instructions 09/01/19 2046 09/03/19 0900   09/01/19 1945  ceFEPIme (MAXIPIME) 2 g in sodium chloride 0.9 % 100 mL IVPB     2 g 200 mL/hr over 30 Minutes Intravenous  Once 09/01/19 1930 09/01/19 2051   09/01/19 1945  metroNIDAZOLE (FLAGYL) IVPB 500 mg     500 mg 100 mL/hr over 60 Minutes Intravenous  Once 09/01/19 1930 09/02/19 0129   09/01/19 1945  vancomycin (VANCOCIN) IVPB 1000 mg/200 mL premix     1,000 mg 200 mL/hr over 60 Minutes Intravenous  Once 09/01/19 1930  09/02/19 0129       Objective: Vitals:   09/18/19 1704 09/18/19 1958 09/19/19 0409 09/19/19 0914  BP: 104/64 (!) 109/55 116/60 120/61  Pulse: 82 65 72 74  Resp: 17 18 18 20   Temp: 97.8 F (36.6 C) 98.4 F (36.9 C) 98.1 F (36.7 C)   TempSrc: Oral Oral Oral   SpO2: 96% 99% 100% 99%  Weight:   56 kg   Height:        Intake/Output Summary (Last 24 hours) at 09/19/2019 1112 Last data filed at 09/19/2019 1000 Gross per 24 hour  Intake 816 ml  Output 551 ml  Net 265 ml   Filed Weights   09/17/19 0400 09/18/19 0435 09/19/19 0409  Weight: 55.7 kg 55.4 kg 56 kg   Examination: General exam: Appears calm and comfortable  Respiratory system: Clear to auscultation. Respiratory effort normal. Cardiovascular system: S1 & S2 heard, Irreg rhythm. No pedal edema. Gastrointestinal system: Abdomen is nondistended, soft and nontender. Normal bowel sounds heard. Central nervous system: Alert Extremities: Symmetric in appearance bilaterally  Psychiatry: Mood & affect appropriate.    Data Reviewed: I have personally reviewed following labs and imaging studies  CBC: Recent Labs  Lab 09/14/19 0532 09/15/19 0608  WBC 10.3 10.3  NEUTROABS 7.4  --   HGB 13.8 14.0  HCT 43.9 44.5  MCV 91.1 91.2  PLT 201 638   Basic Metabolic Panel: Recent Labs  Lab 09/14/19 0532 09/15/19 0608  NA 146* 142  K 3.1* 3.8  CL 102 100  CO2 34* 34*  GLUCOSE 102* 104*  BUN 32* 28*  CREATININE 0.86 0.85  CALCIUM 8.5* 8.5*   GFR: Estimated Creatinine Clearance: 41.2 mL/min (by C-G formula based on SCr of 0.85 mg/dL). Liver Function Tests: No results for input(s): AST, ALT, ALKPHOS, BILITOT, PROT, ALBUMIN in the last 168 hours. No results for input(s): LIPASE, AMYLASE in the last 168 hours. No results for input(s): AMMONIA in the last 168 hours. Coagulation Profile: No results for input(s): INR, PROTIME in the last 168 hours. Cardiac Enzymes: No results for input(s): CKTOTAL, CKMB, CKMBINDEX,  TROPONINI in the last 168 hours. BNP (last 3 results) No results for input(s): PROBNP in the last 8760 hours. HbA1C: No results for input(s): HGBA1C in the last 72 hours. CBG: Recent Labs  Lab 09/16/19 2327 09/17/19 0343 09/17/19 0744 09/17/19 1121 09/17/19 1734  GLUCAP 112* 101* 106* 129* 81   Lipid Profile: No results for input(s): CHOL, HDL, LDLCALC, TRIG, CHOLHDL, LDLDIRECT in the last 72 hours. Thyroid Function Tests: No results for input(s): TSH, T4TOTAL, FREET4, T3FREE, THYROIDAB in the last 72 hours. Anemia Panel: No results for input(s): VITAMINB12, FOLATE, FERRITIN, TIBC, IRON, RETICCTPCT in the last 72 hours. Sepsis Labs: No results for input(s): PROCALCITON, LATICACIDVEN in the last 168 hours.  Recent Results (from the past 240 hour(s))  SARS CORONAVIRUS 2 (TAT 6-24 HRS) Nasopharyngeal Nasopharyngeal Swab     Status: None   Collection Time: 09/11/19  3:58 PM   Specimen: Nasopharyngeal Swab  Result Value Ref Range Status   SARS Coronavirus 2 NEGATIVE NEGATIVE Final    Comment: (NOTE) SARS-CoV-2 target nucleic acids are NOT DETECTED. The SARS-CoV-2 RNA is generally detectable in upper and lower respiratory specimens during the acute phase of infection. Negative results do not preclude SARS-CoV-2 infection, do not rule out co-infections with other pathogens, and should not be used as the sole basis for treatment or other patient management decisions. Negative results must be combined with clinical observations, patient history, and epidemiological information. The expected result is Negative. Fact Sheet for Patients: SugarRoll.be Fact Sheet for Healthcare Providers: https://www.woods-mathews.com/ This test is not yet approved or cleared by the [place] FDA and  has been authorized for detection and/or diagnosis of SARS-CoV-2 by FDA under an Emergency Use Authorization (EUA). This EUA will remain  in effect (meaning  this test can be used) for the duration of the COVID-19 declaration under Section 56 4(b)(1) of the Act, 21 U.S.C. section 360bbb-3(b)(1), unless the authorization is terminated or revoked sooner. Performed at [facility], [place] [address], [place], [place] [address]   SARS CORONAVIRUS 2 (TAT 6-24 HRS) Nasopharyngeal Nasopharyngeal Swab     Status: None   Collection Time: 09/14/19  3:57 PM   Specimen: Nasopharyngeal Swab  Result Value Ref Range Status   SARS Coronavirus 2 NEGATIVE NEGATIVE Final    Comment: (NOTE) SARS-CoV-2 target nucleic acids are NOT DETECTED. The SARS-CoV-2 RNA is generally detectable in upper and lower respiratory specimens during the acute phase of infection. Negative results  do not preclude SARS-CoV-2 infection, do not rule out co-infections with other pathogens, and should not be used as the sole basis for treatment or other patient management decisions. Negative results must be combined with clinical observations, patient history, and epidemiological information. The expected result is Negative. Fact Sheet for Patients: SugarRoll.be Fact Sheet for Healthcare Providers: https://www.woods-mathews.com/ This test is not yet approved or cleared by the [place] FDA and  has been authorized for detection and/or diagnosis of SARS-CoV-2 by FDA under an Emergency Use Authorization (EUA). This EUA will remain  in effect (meaning this test can be used) for the duration of the COVID-19 declaration under Section 56 4(b)(1) of the Act, 21 U.S.C. section 360bbb-3(b)(1), unless the authorization is terminated or revoked sooner. Performed at [facility], [place] [address] [place], [place] [address]       Radiology Studies: No results found.    Scheduled Meds: . atorvastatin  20 mg Oral q1800  . chlorhexidine  15 mL Mouth Rinse BID  . feeding supplement (ENSURE ENLIVE)  237 mL Oral BID BM  . folic acid  1 mg  Oral Daily  . furosemide  40 mg Oral Daily  . mouth rinse  15 mL Mouth Rinse q12n4p  . metoprolol tartrate  50 mg Oral BID  . multivitamin with minerals  1 tablet Oral Daily  . pantoprazole  40 mg Oral BID  . senna-docusate  1 tablet Oral BID  . tamsulosin  0.4 mg Oral Daily   Continuous Infusions: . sodium chloride 10 mL/hr at 09/08/19 2300     LOS: 18 days      Time spent: 58mnutes   JDessa Phi DO [facility] 09/19/2019, 11:12 AM   Available via Epic secure chat 7am-7pm After these hours, please refer to coverage provider listed on [email or web address]

## 2019-09-20 DIAGNOSIS — L8990 Pressure ulcer of unspecified site, unspecified stage: Secondary | ICD-10-CM | POA: Diagnosis present

## 2019-09-20 DIAGNOSIS — R578 Other shock: Secondary | ICD-10-CM | POA: Diagnosis not present

## 2019-09-20 NOTE — Progress Notes (Signed)
Occupational Therapy Treatment Patient Details Name: Tiffany Charles MRN: [id number] DOB: [birth year] Today's Date: 09/20/2019    History of present illness 86 year old female with PMH as below, which is significant for Atrial fibrillation on Xarelto, COPD, CAD, and HTN. She was recently admitted to [facility] and treated for cellulitis with keflex and doxycycline. She presented to [facility] ED 4/8 hypoxemic and hypotensive, after being found altered at SNF with coffee-ground emesis beside her bed. Intubated 4/8-4/11.   OT comments  Pt follows ~75% of commands with multimodal cues. Pt modA overall for ADL tasks today. Pt standing at sink x3 mins. Pt would greatly benefit from continued OT skilled services for ADL, mobility and safety. Pt's goals updated as pt surpassed 2/4 of them. Pt would benefit from 1xweekly OT. OT continues to follow. Pt SNF level for d/c.    Follow Up Recommendations  SNF;Supervision/Assistance - 24 hour    Equipment Recommendations  None recommended by OT    Recommendations for Other Services      Precautions / Restrictions Precautions Precautions: Fall Restrictions Weight Bearing Restrictions: No       Mobility Bed Mobility               General bed mobility comments: in chair on arrival  Transfers Overall transfer level: Needs assistance Equipment used: Rolling walker (2 wheeled) Transfers: Sit to/from Stand Sit to Stand: Min assist         General transfer comment: minA for power up and for initial standing balance    Balance Overall balance assessment: Needs assistance Sitting-balance support: Feet unsupported;Bilateral upper extremity supported Sitting balance-Leahy Scale: Fair     Standing balance support: Bilateral upper extremity supported Standing balance-Leahy Scale: Fair Standing balance comment: Pt standing for pericare and for groomnig at sink                           ADL either performed or assessed with clinical  judgement   ADL Overall ADL's : Needs assistance/impaired     Grooming: Moderate assistance;Sitting Grooming Details (indicate cue type and reason): Multimodal cues, pt taking rest breaks of standing at sink in order to perform tasks within endurance level today. pt standing ~3 mins.         Upper Body Dressing : Minimal assistance;Sitting Upper Body Dressing Details (indicate cue type and reason): donning new gown on commode     Toilet Transfer: Moderate assistance;Cueing for Furniture conservator/restorer;Ambulation Toilet Transfer Details (indicate cue type and reason): modA to avoid plopping Toileting- Clothing Manipulation and Hygiene: Minimal assistance;Cueing for safety;Sitting/lateral lean;Sit to/from stand Toileting - Clothing Manipulation Details (indicate cue type and reason): minA for posterior pericare     Functional mobility during ADLs: Rolling walker;Moderate assistance General ADL Comments: Pt modA overall for ADL tasks today. Pt limited by decreased ability to care for self, decreased strength and decreased ability to sustain attention to focus for cognitive tasks.     Vision       Perception     Praxis      Cognition Arousal/Alertness: Awake/alert Behavior During Therapy: Restless;Flat affect;WFL for tasks assessed/performed Overall Cognitive Status: No family/caregiver present to determine baseline cognitive functioning Area of Impairment: Orientation;Attention;Memory;Following commands;Safety/judgement;Awareness;Problem solving                 Orientation Level: Disoriented to;Place;Time;Situation Current Attention Level: Focused Memory: Decreased short-term memory Following Commands: Follows one step commands inconsistently;Follows one step commands with increased time  Safety/Judgement: Decreased awareness of safety;Decreased awareness of deficits Awareness: Intellectual Problem Solving: Decreased initiation;Requires verbal cues;Slow  processing;Difficulty sequencing;Requires tactile cues General Comments: Pt follows ~75% of commands with multimodal cues. Pt does not appear to be progressing with OT as pt's mental capacity appears limited for any new activity,        Exercises     Shoulder Instructions       General Comments Upon entry, pt had pulledpurewick out and pt's linens were soiled. Pt minA for mobility with RW and modA overall for ADL. Purewick replaced and pt in chair with call bell.     Pertinent Vitals/ Pain       Pain Assessment: No/denies pain Faces Pain Scale: No hurt  Home Living                                          Prior Functioning/Environment              Frequency  Min 1X/week        Progress Toward Goals  OT Goals(current goals can now be found in the care plan section)  Progress towards OT goals: Progressing toward goals  Acute Rehab OT Goals Patient Stated Goal: pt unable to state  goals OT Goal Formulation: Patient unable to participate in goal setting Time For Goal Achievement: 10/04/19 Potential to Achieve Goals: Fair ADL Goals Pt Will Perform Eating: with min assist;sitting Pt Will Perform Grooming: with min guard assist;standing Pt Will Perform Upper Body Dressing: with set-up;sitting Pt Will Transfer to Toilet: with min guard assist;ambulating;regular height toilet;grab bars Additional ADL Goal #1: Pt will follow 2 step command to complete grooming task with minguardA while seated/standing at sink.  Plan Frequency needs to be updated;Discharge plan remains appropriate    Co-evaluation                 AM-PAC OT "6 Clicks" Daily Activity     Outcome Measure   Help from another person eating meals?: A Lot Help from another person taking care of personal grooming?: A Little Help from another person toileting, which includes using toliet, bedpan, or urinal?: A Lot Help from another person bathing (including washing, rinsing, drying)?:  A Lot Help from another person to put on and taking off regular upper body clothing?: A Little Help from another person to put on and taking off regular lower body clothing?: A Lot 6 Click Score: 14    End of Session Equipment Utilized During Treatment: Gait belt;Rolling walker  OT Visit Diagnosis: Unsteadiness on feet (R26.81);Muscle weakness (generalized) (M62.81);Other symptoms and signs involving cognitive function   Activity Tolerance Treatment limited secondary to medical complications (Comment)   Patient Left in chair;with call bell/phone within reach;with chair alarm set   Nurse Communication Mobility status        Time: [id number] OT Time Calculation (min): 19 min  Charges: OT General Charges $OT Visit: 1 Visit OT Treatments $Self Care/Home Management : 8-22 mins  Jefferey Pica, OTR/L Acute Rehabilitation Services Pager: [phone] Office: [phone]    Salihah Peckham C 09/20/2019, 3:18 PM

## 2019-09-20 NOTE — Progress Notes (Signed)
PROGRESS NOTE    Tiffany Charles  [name]:[id number] DOB: [birth year] DOA: 09/01/2019 PCP: [name], MD     Brief Narrative:  Tiffany Charles is an 86 year old female with history of permanent A. fib on Xarelto, COPD, coronary disease, hypertension, chronic diastolic CHF, hyperlipidemia, pulmonary hypertension, skin cancer who presented to the emergency department with complaints of altered mental status, coffee-ground emesis at skilled nursing facility.  Presentation, she was hypoxic, hypotensive, confused needing intubation for airway protection.  INR was more than 10.  CBC showed hemoglobin of 5.5.  She was given Kcentra and blood transfusion.  GI consulted. CT abdomen pelvis on admission no acute GI bleed, large amount of stool at the level of patient's rectum, Patient was admitted under PCCM service.  Patient was extubated on 4/11. EGD deferred due to advanced age/comorbidities.   Medically stable for discharge. Awaiting family to fill out SNF paperwork. See TOC notes.   New events last 24 hours / Subjective: Sitting in recliner, no complaints, pleasantly confused.   Assessment & Plan:   Principal Problem:   Hemorrhagic shock [facility]) Active Problems:   Permanent atrial fibrillation   COPD - PFTs in June 2014 with MET test   CAD, RCA BMS '09, 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 low risk Myoview Feb 2013   Chronic anticoagulation- Xarelto   Goals of care, counseling/discussion   Chronic diastolic (congestive) heart failure (HCC)   Weakness   Mild pulmonary hypertension (HCC)   Occipital stroke (HCC)   Acute liver failure   Acute hypoxemic respiratory failure (HCC)   Upper GI bleed   Palliative care by specialist   Protein-calorie malnutrition, severe   Pressure injury of skin   Hemorrhagic shock: Secondary to upper GI bleed.  She was on Xarelto at home.  Reversed with Kcentra, platelets transfusion, status post 4 units of PRBC.  GI was consulted.  EGD deferred due to  age/comorbidities.  On Protonix.  Resolved and now hemodynamically stable  Acute hypoxic respiratory failure: On presentation, she was intubated for airway protection.  Successful extubation on 4/11.  Currently on room air.  Completed antibiotic therapy for aspiration pneumonia  NSTEMI/ischemic cardiomyopathy/acute on chronic systolic CHF exacerbation: Troponin was elevated on presentation.  Echocardiogram showed EF 35 to 40%, regional wall motion abnormalities, RV systolic function severely reduced, severely dilated left atria, right atria moderate MR mild to moderate TR. Cardiology was consulted, no intervention planned. Continue lasix   A. fib with RVR: Not a candidate for anticoagulation. Continue Lopressor. Rate controlled.    AKI: Resolved  Transaminitis: Most likely secondary to shock liver on presentation versus liver congestion due to CHF.  LFTs trended down.  Hepatitis panel negative.  Dysphagia: Dysphagia diet.  Speech therapy following.  She was treated with Unasyn for aspiration pneumonia.  Hyperlipidemia: Continue Lipitor  Acute metabolic encephalopathy: Multifactorial cause.  Continue supportive care.  History of CVA .  History of dementia diagnosed about 6 to 8 months ago.  Stable   Peripheral vascular disease/chronic left foot wound: S/p Percutaneous transluminal angioplasty and stent placementleftsuperficial femoral artery and popliteal on 3/17. Daily application of small foam dressing for left foot wound  Constipation: Lactulose PRN  Goals of care: DNR/DNI.  Palliative care was consulted.  Recommend outpatient follow-up with palliative care.   DVT prophylaxis: SCD Code Status: DNR Family Communication: No family at bedside Disposition Plan:   Status is: Inpatient  Remains inpatient appropriate because:Unsafe d/c plan   Dispo: The patient is from: SNF  Anticipated d/c is to: SNF              Anticipated d/c date is: 1 day               Patient currently is medically stable to d/c.  Awaiting family to complete paperwork for SNF placement.     Consultants:   Cardiology  GI  PCCM  Palliative care   Antimicrobials:  Anti-infectives (From admission, onward)   Start     Dose/Rate Route Frequency Ordered Stop   09/09/19 1800  amoxicillin-clavulanate (AUGMENTIN) 500-125 MG per tablet 500 mg     500 mg Oral 2 times daily 09/09/19 1353 09/12/19 2003   09/06/19 1430  Ampicillin-Sulbactam (UNASYN) 3 g in sodium chloride 0.9 % 100 mL IVPB  Status:  Discontinued     3 g 200 mL/hr over 30 Minutes Intravenous Every 8 hours 09/06/19 1340 09/09/19 1353   09/02/19 2030  ceFEPIme (MAXIPIME) 2 g in sodium chloride 0.9 % 100 mL IVPB  Status:  Discontinued     2 g 200 mL/hr over 30 Minutes Intravenous Every 24 hours 09/01/19 2046 09/03/19 0900   09/01/19 2043  vancomycin variable dose per unstable renal function (pharmacist dosing)  Status:  Discontinued      Does not apply See admin instructions 09/01/19 2046 09/03/19 0900   09/01/19 1945  ceFEPIme (MAXIPIME) 2 g in sodium chloride 0.9 % 100 mL IVPB     2 g 200 mL/hr over 30 Minutes Intravenous  Once 09/01/19 1930 09/01/19 2051   09/01/19 1945  metroNIDAZOLE (FLAGYL) IVPB 500 mg     500 mg 100 mL/hr over 60 Minutes Intravenous  Once 09/01/19 1930 09/02/19 0129   09/01/19 1945  vancomycin (VANCOCIN) IVPB 1000 mg/200 mL premix     1,000 mg 200 mL/hr over 60 Minutes Intravenous  Once 09/01/19 1930 09/02/19 0129       Objective: Vitals:   09/19/19 2010 09/20/19 0358 09/20/19 0400 09/20/19 0851  BP: 132/86 129/69  133/68  Pulse: 84 71  93  Resp: 14 18  20   Temp: 98.1 F (36.7 C) (!) 97.5 F (36.4 C)    TempSrc: Oral Oral    SpO2: 99% 97%  97%  Weight:   58.1 kg   Height:        Intake/Output Summary (Last 24 hours) at 09/20/2019 1037 Last data filed at 09/20/2019 1011 Gross per 24 hour  Intake 836 ml  Output 753 ml  Net 83 ml   Filed Weights   09/18/19 0435  09/19/19 0409 09/20/19 0400  Weight: 55.4 kg 56 kg 58.1 kg    Examination: General exam: Appears calm and comfortable  Central nervous system: Alert  Psychiatry: Pleasantly confused    Data Reviewed: I have personally reviewed following labs and imaging studies  CBC: Recent Labs  Lab 09/14/19 0532 09/15/19 0608  WBC 10.3 10.3  NEUTROABS 7.4  --   HGB 13.8 14.0  HCT 43.9 44.5  MCV 91.1 91.2  PLT 201 466   Basic Metabolic Panel: Recent Labs  Lab 09/14/19 0532 09/15/19 0608  NA 146* 142  K 3.1* 3.8  CL 102 100  CO2 34* 34*  GLUCOSE 102* 104*  BUN 32* 28*  CREATININE 0.86 0.85  CALCIUM 8.5* 8.5*   GFR: Estimated Creatinine Clearance: 42.8 mL/min (by C-G formula based on SCr of 0.85 mg/dL). Liver Function Tests: No results for input(s): AST, ALT, ALKPHOS, BILITOT, PROT, ALBUMIN in the last 168 hours. No  results for input(s): LIPASE, AMYLASE in the last 168 hours. No results for input(s): AMMONIA in the last 168 hours. Coagulation Profile: No results for input(s): INR, PROTIME in the last 168 hours. Cardiac Enzymes: No results for input(s): CKTOTAL, CKMB, CKMBINDEX, TROPONINI in the last 168 hours. BNP (last 3 results) No results for input(s): PROBNP in the last 8760 hours. HbA1C: No results for input(s): HGBA1C in the last 72 hours. CBG: Recent Labs  Lab 09/16/19 2327 09/17/19 0343 09/17/19 0744 09/17/19 1121 09/17/19 1734  GLUCAP 112* 101* 106* 129* 81   Lipid Profile: No results for input(s): CHOL, HDL, LDLCALC, TRIG, CHOLHDL, LDLDIRECT in the last 72 hours. Thyroid Function Tests: No results for input(s): TSH, T4TOTAL, FREET4, T3FREE, THYROIDAB in the last 72 hours. Anemia Panel: No results for input(s): VITAMINB12, FOLATE, FERRITIN, TIBC, IRON, RETICCTPCT in the last 72 hours. Sepsis Labs: No results for input(s): PROCALCITON, LATICACIDVEN in the last 168 hours.  Recent Results (from the past 240 hour(s))  SARS CORONAVIRUS 2 (TAT 6-24 HRS)  Nasopharyngeal Nasopharyngeal Swab     Status: None   Collection Time: 09/11/19  3:58 PM   Specimen: Nasopharyngeal Swab  Result Value Ref Range Status   SARS Coronavirus 2 NEGATIVE NEGATIVE Final    Comment: (NOTE) SARS-CoV-2 target nucleic acids are NOT DETECTED. The SARS-CoV-2 RNA is generally detectable in upper and lower respiratory specimens during the acute phase of infection. Negative results do not preclude SARS-CoV-2 infection, do not rule out co-infections with other pathogens, and should not be used as the sole basis for treatment or other patient management decisions. Negative results must be combined with clinical observations, patient history, and epidemiological information. The expected result is Negative. Fact Sheet for Patients: SugarRoll.be Fact Sheet for Healthcare Providers: https://www.woods-mathews.com/ This test is not yet approved or cleared by the [place] FDA and  has been authorized for detection and/or diagnosis of SARS-CoV-2 by FDA under an Emergency Use Authorization (EUA). This EUA will remain  in effect (meaning this test can be used) for the duration of the COVID-19 declaration under Section 56 4(b)(1) of the Act, 21 U.S.C. section 360bbb-3(b)(1), unless the authorization is terminated or revoked sooner. Performed at [facility], [place] [address], [place], [place] [address]   SARS CORONAVIRUS 2 (TAT 6-24 HRS) Nasopharyngeal Nasopharyngeal Swab     Status: None   Collection Time: 09/14/19  3:57 PM   Specimen: Nasopharyngeal Swab  Result Value Ref Range Status   SARS Coronavirus 2 NEGATIVE NEGATIVE Final    Comment: (NOTE) SARS-CoV-2 target nucleic acids are NOT DETECTED. The SARS-CoV-2 RNA is generally detectable in upper and lower respiratory specimens during the acute phase of infection. Negative results do not preclude SARS-CoV-2 infection, do not rule out co-infections with other pathogens,  and should not be used as the sole basis for treatment or other patient management decisions. Negative results must be combined with clinical observations, patient history, and epidemiological information. The expected result is Negative. Fact Sheet for Patients: SugarRoll.be Fact Sheet for Healthcare Providers: https://www.woods-mathews.com/ This test is not yet approved or cleared by the [place] FDA and  has been authorized for detection and/or diagnosis of SARS-CoV-2 by FDA under an Emergency Use Authorization (EUA). This EUA will remain  in effect (meaning this test can be used) for the duration of the COVID-19 declaration under Section 56 4(b)(1) of the Act, 21 U.S.C. section 360bbb-3(b)(1), unless the authorization is terminated or revoked sooner. Performed at [facility], [address] [place],  [address]       Radiology Studies: No results found.    Scheduled Meds: . atorvastatin  20 mg Oral q1800  . chlorhexidine  15 mL Mouth Rinse BID  . feeding supplement (ENSURE ENLIVE)  237 mL Oral BID BM  . folic acid  1 mg Oral Daily  . furosemide  40 mg Oral Daily  . mouth rinse  15 mL Mouth Rinse q12n4p  . metoprolol tartrate  50 mg Oral BID  . multivitamin with minerals  1 tablet Oral Daily  . pantoprazole  40 mg Oral BID  . senna-docusate  1 tablet Oral BID  . tamsulosin  0.4 mg Oral Daily   Continuous Infusions: . sodium chloride 10 mL/hr at 09/08/19 2300     LOS: 19 days      Time spent: 15 minutes   Dessa Phi, DO [facility] 09/20/2019, 10:37 AM   Available via Epic secure chat 7am-7pm After these hours, please refer to coverage provider listed on [email or web address]

## 2019-09-20 NOTE — TOC Progression Note (Addendum)
Transition of Care [facility]) - Progression Note    Patient Details  Name: Tiffany Charles MRN: [id number] Date of Birth: Nov 28, 1929  Transition of Care [facility]) CM/SW [place], [place] Phone Number: 09/20/2019, 12:06 PM  Clinical Narrative:     Update: [facility] not in network and states they will call [name] to explain as well.   CSW called [name] who states he's completing [facility] paperwork now and will send it in the next 15 minutes.   [facility] states they can take patient back if she has been vaccinated which is a requirement for their semi private rooms, for a private room they will not be ready until 2 weeks due to remodeling.   [name] states he spoke with Acie Fredrickson with admissions at [facility] and states they are going to re review patient's chart. CSW spoke with admissions at [facility] they will call CSW back with an update on this, as they had declined referral x2 in the past.   Insurance auth will need to be re-started with SNF once all documents completed. [facility] reports IF they make a bed offer, [name] will need to complete all of their paperwork as this is required for admission.   Expected Discharge Plan: [place] Barriers to Discharge: Continued Medical Work up  Expected Discharge Plan and Services Expected Discharge Plan: [place] arrangements for the past 2 months: [place]                                       Social Determinants of Health (SDOH) Interventions    Readmission Risk Interventions No flowsheet data found.

## 2019-09-20 NOTE — Progress Notes (Signed)
Physical Therapy Treatment Patient Details Name: Tiffany Charles MRN: [id number] DOB: [birth year] Today's Date: 09/20/2019    History of Present Illness 86 year old female with PMH as below, which is significant for Atrial fibrillation on Xarelto, COPD, CAD, and HTN. She was recently admitted to [facility] and treated for cellulitis with keflex and doxycycline. She presented to [facility] ED 4/8 hypoxemic and hypotensive, after being found altered at SNF with coffee-ground emesis beside her bed. Intubated 4/8-4/11.    PT Comments    Pt admitted with above diagnosis. Pt has met 0/4 goals as she is limited by her confusion.  Goals revised and continue toward goals. Pt able to ambulate with RW with min guard assist to min assist and min to mod cues for safety.  Pt with poor safety awareness and need for guarding for safety.   Pt currently with functional limitations due to balance and endurance deficits. Pt will benefit from skilled PT to increase their independence and safety with mobility to allow discharge to the venue listed below.     Follow Up Recommendations  SNF     Equipment Recommendations  Other (comment)(defer to post acute)    Recommendations for Other Services       Precautions / Restrictions Precautions Precautions: Fall Restrictions Weight Bearing Restrictions: No    Mobility  Bed Mobility               General bed mobility comments: in chair on arrival  Transfers Overall transfer level: Needs assistance Equipment used: Rolling walker (2 wheeled) Transfers: Sit to/from Stand Sit to Stand: Min assist;Min guard         General transfer comment: min A to power up with max encouragement and multimodal cues.  Then min Guard assist once in  standing. Multimodal cues for walker management and hand placement  Ambulation/Gait Ambulation/Gait assistance: Min guard Gait Distance (Feet): 125 Feet Assistive device: Rolling walker (2 wheeled) Gait Pattern/deviations:  Step-to pattern;Decreased stride length;Drifts right/left;Trunk flexed;Wide base of support Gait velocity: slower Gait velocity interpretation: <1.31 ft/sec, indicative of household ambulator General Gait Details: minG for safety, pt able to ambulate in room with hands on assistto use RW correctly and if she didnt have RW, she was reaching for furniture, LOB at times needing min to min guard assist. close assist/guard for safety, max encouragment to encourage/direct   Stairs             Wheelchair Mobility    Modified Rankin (Stroke Patients Only)       Balance Overall balance assessment: Needs assistance Sitting-balance support: Feet unsupported;Bilateral upper extremity supported Sitting balance-Leahy Scale: Fair     Standing balance support: Bilateral upper extremity supported Standing balance-Leahy Scale: Fair Standing balance comment: Pt tried to wipe herself with wide BOS but needed steadying assist and hand on rail.  Cannot stand without UE support                             Cognition Arousal/Alertness: Awake/alert Behavior During Therapy: Restless;Flat affect;WFL for tasks assessed/performed Overall Cognitive Status: No family/caregiver present to determine baseline cognitive functioning Area of Impairment: Orientation;Attention;Memory;Following commands;Safety/judgement;Awareness;Problem solving                 Orientation Level: Disoriented to;Place;Time;Situation Current Attention Level: Focused Memory: Decreased short-term memory Following Commands: Follows one step commands inconsistently Safety/Judgement: Decreased awareness of safety;Decreased awareness of deficits Awareness: Intellectual Problem Solving: Decreased initiation;Requires verbal cues;Slow processing;Difficulty  sequencing;Requires tactile cues General Comments: Pt not oriented in sesion, but would respond to name, impulsive in movement      Exercises      General  Comments General comments (skin integrity, edema, etc.): VSS. Noted that pt was wet with urine on arrival.  No purewick in place. called NT to place purewick at end of session. Also took pt to bathroom and she urinated in toilet as well.       Pertinent Vitals/Pain Pain Assessment: No/denies pain Faces Pain Scale: No hurt    Home Living                      Prior Function            PT Goals (current goals can now be found in the care plan section) Acute Rehab PT Goals Patient Stated Goal: pt unable to state  goals PT Goal Formulation: With patient Time For Goal Achievement: 10/04/19 Potential to Achieve Goals: Good Progress towards PT goals: Progressing toward goals    Frequency    Min 2X/week      PT Plan Current plan remains appropriate    Co-evaluation              AM-PAC PT "6 Clicks" Mobility   Outcome Measure  Help needed turning from your back to your side while in a flat bed without using bedrails?: A Little Help needed moving from lying on your back to sitting on the side of a flat bed without using bedrails?: A Little Help needed moving to and from a bed to a chair (including a wheelchair)?: A Little Help needed standing up from a chair using your arms (e.g., wheelchair or bedside chair)?: A Little Help needed to walk in hospital room?: A Little Help needed climbing 3-5 steps with a railing? : A Little 6 Click Score: 18    End of Session Equipment Utilized During Treatment: Gait belt Activity Tolerance: Patient tolerated treatment well;Patient limited by fatigue Patient left: with call bell/phone within reach;in chair;with chair alarm set Nurse Communication: Mobility status PT Visit Diagnosis: Unsteadiness on feet (R26.81);Muscle weakness (generalized) (M62.81)     Time: 4098-1191 PT Time Calculation (min) (ACUTE ONLY): 16 min  Charges:  $Gait Training: 8-22 mins                     Kaicen Desena W,PT [place] Pager:   [phone]  Office:  [place] 09/20/2019, 12:11 PM

## 2019-09-21 DIAGNOSIS — E7800 Pure hypercholesterolemia, unspecified: Secondary | ICD-10-CM

## 2019-09-21 DIAGNOSIS — I4891 Unspecified atrial fibrillation: Secondary | ICD-10-CM | POA: Diagnosis not present

## 2019-09-21 DIAGNOSIS — J9601 Acute respiratory failure with hypoxia: Secondary | ICD-10-CM | POA: Diagnosis not present

## 2019-09-21 DIAGNOSIS — K7200 Acute and subacute hepatic failure without coma: Secondary | ICD-10-CM | POA: Diagnosis not present

## 2019-09-21 DIAGNOSIS — L89001 Pressure ulcer of unspecified elbow, stage 1: Secondary | ICD-10-CM

## 2019-09-21 DIAGNOSIS — R7401 Elevation of levels of liver transaminase levels: Secondary | ICD-10-CM

## 2019-09-21 DIAGNOSIS — E43 Unspecified severe protein-calorie malnutrition: Secondary | ICD-10-CM

## 2019-09-21 DIAGNOSIS — L89301 Pressure ulcer of unspecified buttock, stage 1: Secondary | ICD-10-CM

## 2019-09-21 DIAGNOSIS — J431 Panlobular emphysema: Secondary | ICD-10-CM

## 2019-09-21 DIAGNOSIS — R578 Other shock: Secondary | ICD-10-CM | POA: Diagnosis not present

## 2019-09-21 DIAGNOSIS — I255 Ischemic cardiomyopathy: Secondary | ICD-10-CM

## 2019-09-21 LAB — CBC
HCT: 39.4 % (ref 36.0–46.0)
Hemoglobin: 12.5 g/dL (ref 12.0–15.0)
MCH: 28.7 pg (ref 26.0–34.0)
MCHC: 31.7 g/dL (ref 30.0–36.0)
MCV: 90.4 fL (ref 80.0–100.0)
Platelets: 268 10*3/uL (ref 150–400)
RBC: 4.36 MIL/uL (ref 3.87–5.11)
RDW: 17 % — ABNORMAL HIGH (ref 11.5–15.5)
WBC: 6.6 10*3/uL (ref 4.0–10.5)
nRBC: 0 % (ref 0.0–0.2)

## 2019-09-21 LAB — BASIC METABOLIC PANEL
Anion gap: 8 (ref 5–15)
BUN: 24 mg/dL — ABNORMAL HIGH (ref 8–23)
CO2: 28 mmol/L (ref 22–32)
Calcium: 8.7 mg/dL — ABNORMAL LOW (ref 8.9–10.3)
Chloride: 100 mmol/L (ref 98–111)
Creatinine, Ser: 0.8 mg/dL (ref 0.44–1.00)
GFR calc Af Amer: >60 mL/min (ref >60)
GFR calc non Af Amer: >60 mL/min (ref >60)
Glucose, Bld: 95 mg/dL (ref 70–99)
Potassium: 4.2 mmol/L (ref 3.5–5.1)
Sodium: 136 mmol/L (ref 135–145)

## 2019-09-21 NOTE — Progress Notes (Signed)
PROGRESS NOTE    Tiffany Charles  TML:[id number] DOB: 04/02/1930 DOA: 09/01/2019 PCP: [name], MD     Brief Narrative:  86 year old WF PMHx Permanent A. fib on Xarelto, COPD, coronary disease, hypertension, chronic diastolic CHF, hyperlipidemia, pulmonary hypertension, skin cancer   Presented to the emergency department with complaints of altered mental status, coffee-ground emesis at skilled nursing facility. Presentation, she was hypoxic, hypotensive, confused needing intubation for airway protection. INR was more than 10. CBC showed hemoglobin of 5.5. She was given Kcentra and blood transfusion. GI consulted. CT abdomen pelvis on admission no acute GI bleed, large amount of stool at the level of patient's rectum, Patient was admitted under PCCM service. Patient was extubated on 4/11. EGD deferred due to advanced age/comorbidities.   Medically stable for discharge. Awaiting family to fill out SNF paperwork. See TOC notes.     Subjective: A/O x1 (does not know where, when, why) follows commands.   Assessment & Plan:   Principal Problem:   Hemorrhagic shock [facility]) Active Problems:   Permanent atrial fibrillation   COPD - PFTs in June 2014 with MET test   CAD, RCA BMS '09, 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 low risk Myoview Feb 2013   Chronic anticoagulation- Xarelto   Goals of care, counseling/discussion   Chronic diastolic (congestive) heart failure (HCC)   Weakness   Mild pulmonary hypertension (HCC)   Occipital stroke (HCC)   Acute liver failure   Acute hypoxemic respiratory failure (HCC)   Upper GI bleed   Palliative care by specialist   Protein-calorie malnutrition, severe   Pressure injury of skin   Acute respiratory failure with hypoxia (HCC)   NSTEMI (non-ST elevated myocardial infarction) ([place])   Ischemic cardiomyopathy   Atrial fibrillation with RVR (HCC)   Transaminitis   HLD (hyperlipidemia)   PVD (peripheral vascular disease) (HCC)    Stage I decubitus ulcer and pressure area   Hemorrhagic shock: Secondary to upper GI bleed. She was on Xarelto at home. Reversed with Kcentra, platelets transfusion, status post 4 units of PRBC. GI was consulted. EGD deferred due to age/comorbidities. On Protonix.  Resolved and now hemodynamically stable  Acute respiratory failure with hypoxia :On presentation, she was intubated for airway protection. Successful extubation on 4/11. Currently on room air. Completed antibiotic therapy for aspiration pneumonia  NSTEMI/ischemic cardiomyopathy/acute on chronic systolic CHF exacerbation: Troponin was elevated on presentation. Echocardiogram showed EF 35 to 40%, regional wall motion abnormalities, RV systolic function severely reduced, severely dilated left atria, right atria moderate MR mild to moderate TR. Cardiology was consulted, no intervention planned. Continue lasix   A. fib with RVR: Not a candidate for anticoagulation. Continue Lopressor. Rate controlled.   AKI: Resolved  Transaminitis: Most likely secondary to shock liver on presentation versus liver congestion due to CHF. LFTs trended down. Hepatitis panel negative.  Dysphagia:Dysphagia diet. Speech therapy following. She was treated with Unasyn for aspiration pneumonia.  Hyperlipidemia: Continue Lipitor  Acute metabolic encephalopathy: Multifactorial cause. Continue supportive care. History of CVA . History of dementia diagnosed about 6 to 8 months ago.  Stable   Peripheral vascular disease/chronic left foot wound: S/p Percutaneous transluminal angioplasty and stent placementleftsuperficial femoral artery and popliteal on 3/17. Daily application of small foam dressing for left foot wound  Stage I medial buttocks wound Pressure Injury 09/20/19 Buttocks Medial Stage 1 -  Intact skin with non-blanchable redness of a localized area usually over a bony prominence. (Active)  09/20/19 0028  Location: Buttocks  Location Orientation: Medial  Staging: Stage 1 -  Intact skin with non-blanchable redness of a localized area usually over a bony prominence.  Wound Description (Comments):   Present on Admission:        Constipation: Lactulose PRN  Goals of care: DNR/DNI. Palliative care was consulted. Recommend outpatient follow-up with palliative care.     DVT prophylaxis: SCD Code Status: DNR Family Communication:  Disposition Plan:  1.  Where the patient is from 2.  Anticipated d/c place. 3.  Barriers to d/c Per EMR APS has been contacted, and abandonment papers have been filled out.  Awaiting guardian ad litem to be appointed in order for patient to be placed to SNF   Consultants:  Cardiology GI PCCM Palliative care   Procedures/Significant Events:    I have personally reviewed and interpreted all radiology studies and my findings are as above.  VENTILATOR SETTINGS:    Cultures   Antimicrobials: Anti-infectives (From admission, onward)   Start     Dose/Rate Stop   09/09/19 1800  amoxicillin-clavulanate (AUGMENTIN) 500-125 MG per tablet 500 mg     500 mg 09/12/19 2003   09/06/19 1430  Ampicillin-Sulbactam (UNASYN) 3 g in sodium chloride 0.9 % 100 mL IVPB  Status:  Discontinued     3 g 200 mL/hr over 30 Minutes 09/09/19 1353   09/02/19 2030  ceFEPIme (MAXIPIME) 2 g in sodium chloride 0.9 % 100 mL IVPB  Status:  Discontinued     2 g 200 mL/hr over 30 Minutes 09/03/19 0900   09/01/19 2043  vancomycin variable dose per unstable renal function (pharmacist dosing)  Status:  Discontinued      09/03/19 0900   09/01/19 1945  ceFEPIme (MAXIPIME) 2 g in sodium chloride 0.9 % 100 mL IVPB     2 g 200 mL/hr over 30 Minutes 09/01/19 2051   09/01/19 1945  metroNIDAZOLE (FLAGYL) IVPB 500 mg     500 mg 100 mL/hr over 60 Minutes 09/02/19 0129   09/01/19 1945  vancomycin (VANCOCIN) IVPB 1000 mg/200 mL premix     1,000 mg 200 mL/hr over 60 Minutes 09/02/19 0129        Devices    LINES / TUBES:      Continuous Infusions:  sodium chloride 10 mL/hr at 09/08/19 2300     Objective: Vitals:   09/21/19 1116 09/21/19 2006 09/22/19 0109 09/22/19 0538  BP: 117/66 126/70  (!) 155/83  Pulse: 78 75  79  Resp: _0 Temp: 98.5 F (36.9 C) 98.2 F (36.8 C)  98.1 F (36.7 C)  TempSrc: Oral Oral  Oral  SpO2: 96% 98%  95%  Weight:   54.2 kg   Height:        Intake/Output Summary (Last 24 hours) at 09/22/2019 0755 Last data filed at 09/22/2019 0540 Gross per 24 hour  Intake 420 ml  Output 901 ml  Net -481 ml   Filed Weights   09/20/19 0400 09/21/19 0400 09/22/19 0109  Weight: 58.1 kg 55.2 kg 54.2 kg    Examination:  General: A/O x1 (does not know where, when, why), pleasantly confused, follows commands no acute respiratory distress Eyes: negative scleral hemorrhage, negative anisocoria, negative icterus ENT: Negative Runny nose, negative gingival bleeding, Neck:  Negative scars, masses, torticollis, lymphadenopathy, JVD Lungs: Clear to auscultation bilaterally without wheezes or crackles Cardiovascular: Regular rate and rhythm without murmur gallop or rub normal S1 and S2 Abdomen: negative abdominal pain, nondistended, positive soft, bowel sounds,  no rebound, no ascites, no appreciable mass Extremities: No significant cyanosis, clubbing, or edema bilateral lower extremities Skin: Negative rashes, lesions, ulcers Psychiatric: Unable to assess secondary to altered mental status Central nervous system:  Cranial nerves II through XII intact, tongue/uvula midline, all extremities muscle strength 5/5, sensation intact throughout,negative dysarthria, negative expressive aphasia, negative receptive aphasia.  .     Data Reviewed: Care during the described time interval was provided by me .  I have reviewed this patient's available data, including medical history, events of note, physical examination, and all test results as part of my  evaluation.  CBC: Recent Labs  Lab 09/21/19 0537 09/22/19 0353  WBC 6.6 7.4  HGB 12.5 13.3  HCT 39.4 42.0  MCV 90.4 90.9  PLT 268 263   Basic Metabolic Panel: Recent Labs  Lab 09/21/19 0537 09/22/19 0353  NA 136 136  K 4.2 4.2  CL 100 97*  CO2 28 28  GLUCOSE 95 107*  BUN 24* 25*  CREATININE 0.80 1.06*  CALCIUM 8.7* 8.7*  MG  --  1.7  PHOS  --  3.2   GFR: Estimated Creatinine Clearance: 32 mL/min (A) (by C-G formula based on SCr of 1.06 mg/dL (H)). Liver Function Tests: No results for input(s): AST, ALT, ALKPHOS, BILITOT, PROT, ALBUMIN in the last 168 hours. No results for input(s): LIPASE, AMYLASE in the last 168 hours. No results for input(s): AMMONIA in the last 168 hours. Coagulation Profile: No results for input(s): INR, PROTIME in the last 168 hours. Cardiac Enzymes: No results for input(s): CKTOTAL, CKMB, CKMBINDEX, TROPONINI in the last 168 hours. BNP (last 3 results) No results for input(s): PROBNP in the last 8760 hours. HbA1C: No results for input(s): HGBA1C in the last 72 hours. CBG: Recent Labs  Lab 09/16/19 2327 09/17/19 0343 09/17/19 0744 09/17/19 1121 09/17/19 1734  GLUCAP 112* 101* 106* 129* 81   Lipid Profile: No results for input(s): CHOL, HDL, LDLCALC, TRIG, CHOLHDL, LDLDIRECT in the last 72 hours. Thyroid Function Tests: No results for input(s): TSH, T4TOTAL, FREET4, T3FREE, THYROIDAB in the last 72 hours. Anemia Panel: No results for input(s): VITAMINB12, FOLATE, FERRITIN, TIBC, IRON, RETICCTPCT in the last 72 hours. Sepsis Labs: No results for input(s): PROCALCITON, LATICACIDVEN in the last 168 hours.  Recent Results (from the past 240 hour(s))  SARS CORONAVIRUS 2 (TAT 6-24 HRS) Nasopharyngeal Nasopharyngeal Swab     Status: None   Collection Time: 09/14/19  3:57 PM   Specimen: Nasopharyngeal Swab  Result Value Ref Range Status   SARS Coronavirus 2 NEGATIVE NEGATIVE Final    Comment: (NOTE) SARS-CoV-2 target nucleic acids are  NOT DETECTED. The SARS-CoV-2 RNA is generally detectable in upper and lower respiratory specimens during the acute phase of infection. Negative results do not preclude SARS-CoV-2 infection, do not rule out co-infections with other pathogens, and should not be used as the sole basis for treatment or other patient management decisions. Negative results must be combined with clinical observations, patient history, and epidemiological information. The expected result is Negative. Fact Sheet for Patients: SugarRoll.be Fact Sheet for Healthcare Providers: https://www.-mathews.com/ This test is not yet approved or cleared by the [place] FDA and  has been authorized for detection and/or diagnosis of SARS-CoV-2 by FDA under an Emergency Use Authorization (EUA). This EUA will remain  in effect (meaning this test can be used) for the duration of the COVID-19 declaration under Section 56 4(b)(1) of the Act, 21 U.S.C. section 360bbb-3(b)(1), unless the authorization is terminated or  revoked sooner. Performed at [facility], [place] [address] [place], [place] [address]          Radiology Studies: No results found.      Scheduled Meds:  atorvastatin  20 mg Oral q1800   chlorhexidine  15 mL Mouth Rinse BID   feeding supplement (ENSURE ENLIVE)  237 mL Oral BID BM   folic acid  1 mg Oral Daily   furosemide  40 mg Oral Daily   mouth rinse  15 mL Mouth Rinse q12n4p   metoprolol tartrate  50 mg Oral BID   multivitamin with minerals  1 tablet Oral Daily   pantoprazole  40 mg Oral BID   senna-docusate  1 tablet Oral BID   tamsulosin  0.4 mg Oral Daily   Continuous Infusions:  sodium chloride 10 mL/hr at 09/08/19 2300     LOS: 21 days    Time spent:40 min    Kailena Lubas, Geraldo Docker, MD [facility] Pager [phone]  If 7PM-7AM, please contact night-coverage [email or web address] Password [facility] 09/22/2019, 7:55 AM

## 2019-09-21 NOTE — TOC Progression Note (Addendum)
Transition of Care [facility]) - Progression Note    Patient Details  Name: Tiffany Charles MRN: [id number] Date of Birth: [birth year]  Transition of Care [facility]) CM/SW [place], [place] Phone Number: 09/21/2019, 9:21 AM  Clinical Narrative:     Update: [place] reports after further review they are unable to accept patient due ot her leaving AMA from their facility in March. CSW spoke with second [place] who reports patient is currently in co pay days with insurance due to being at [facility] recently and using her fully covered days. At this time, Tiffany Charles would need to pay $180/day and they would require 10 days of co payment up front prior to admission. CSW has called Tiffany Charles to update on this information, no answer. CSW lvm requesting a call back to inquire if he is able to financially cover the copayment costs.    CSW spoke with patient's grandson Tiffany Charles, informed of barriers with returning to [facility] due to no bed avail, and his preference of [facility] not being in network. CSW provided [place] with other bed offers, he chose [facility].   CSW spoke with Tiffany Charles at [facility], they are starting [place] today, will let CSW know once approved.  Expected Discharge Plan: [place] Barriers to Discharge: Continued Medical Work up  Expected Discharge Plan and Services Expected Discharge Plan: [place] arrangements for the past 2 months: [place]                                       Social Determinants of Health (SDOH) Interventions    Readmission Risk Interventions No flowsheet data found.

## 2019-09-21 NOTE — Progress Notes (Signed)
Nutrition Follow-up  DOCUMENTATION CODES:   Severe malnutrition in context of chronic illness  INTERVENTION:   -Continue Ensure Enlive po BID, each supplement provides 350 kcal and 20 grams of protein -Continue Magic cup TID with meals, each supplement provides 290 kcal and 9 grams of protein -Continue MVI with minerals daily  NUTRITION DIAGNOSIS:   Severe Malnutrition related to chronic illness(COPD) as evidenced by moderate fat depletion, severe fat depletion, moderate muscle depletion, severe muscle depletion.  Ongoing  GOAL:   Patient will meet greater than or equal to 90% of their needs  Progressing   MONITOR:   PO intake, Supplement acceptance, Weight trends, Skin, Labs, I & O's, Diet advancement  REASON FOR ASSESSMENT:   Ventilator    ASSESSMENT:   86 yo female admitted with hemorrhagic shock due to GI hemorrhage. Required intubation on admission. PMH includes A fib, COPD, CAD, HTN.  4/11- extubated 4/12- s/p BSE- recommend continue NPO 4/13- s/p BSE- advanced to dysphagia 1 diet with nectar thick liquids 4/23- advanced to dysphagia 2 diet with thin liquids  Reviewed I/O's: +638 ml x 24 hours and _7 L since 09/07/19  UOP: 201 ml x 24 hours  Per SLP notes, pt tolerating current diet texture well. Pt remains with good appetite; noted meal completion 25-100%. Pt is consuming Ensure supplements per MAR.   Per [facility] team notes, is medically stable for discharge, however, awaiting SNF placement.   Labs reviewed: CBGS: 81-129.   Diet Order:   Diet Order            DIET DYS 2 Room service appropriate? Yes with Assist; Fluid consistency: Thin  Diet effective now              EDUCATION NEEDS:   No education needs have been identified at this time  Skin:  Skin Assessment: Skin Integrity Issues: Skin Integrity Issues:: Other (Comment) Other: non pressure wound to lt foot with swelling, skin tear to lt arm  Last BM:  09/20/19  Height:   Ht Readings from  Last 1 Encounters:  09/01/19 5\' 6"  (1.676 m)    Weight:   Wt Readings from Last 1 Encounters:  09/21/19 55.2 kg    Ideal Body Weight:  59.1 kg  BMI:  Body mass index is 19.64 kg/m.  Estimated Nutritional Needs:   Kcal:  1500-1700  Protein:  75-90 grams  Fluid:  > 1.5 L    Loistine Chance, RD, LDN, [place] Registered Dietitian II Certified Diabetes Care and Education Specialist Please refer to [facility] for RD and/or RD on-call/weekend/after hours pager

## 2019-09-21 NOTE — Progress Notes (Signed)
[facility] ([facility])  [facility] received referral on 4/16 to help with outpatient palliative care services once she is discharged.   Will continue to follow patient during hospital stay and will assist with discharge.   Clementeen Hoof, BSN, [facility])

## 2019-09-22 DIAGNOSIS — K7200 Acute and subacute hepatic failure without coma: Secondary | ICD-10-CM | POA: Diagnosis not present

## 2019-09-22 DIAGNOSIS — R7401 Elevation of levels of liver transaminase levels: Secondary | ICD-10-CM | POA: Diagnosis present

## 2019-09-22 DIAGNOSIS — I255 Ischemic cardiomyopathy: Secondary | ICD-10-CM | POA: Diagnosis present

## 2019-09-22 DIAGNOSIS — I4891 Unspecified atrial fibrillation: Secondary | ICD-10-CM | POA: Diagnosis present

## 2019-09-22 DIAGNOSIS — I739 Peripheral vascular disease, unspecified: Secondary | ICD-10-CM | POA: Diagnosis present

## 2019-09-22 DIAGNOSIS — L8991 Pressure ulcer of unspecified site, stage 1: Secondary | ICD-10-CM | POA: Diagnosis present

## 2019-09-22 DIAGNOSIS — E785 Hyperlipidemia, unspecified: Secondary | ICD-10-CM | POA: Diagnosis present

## 2019-09-22 DIAGNOSIS — J9601 Acute respiratory failure with hypoxia: Secondary | ICD-10-CM | POA: Diagnosis not present

## 2019-09-22 DIAGNOSIS — I214 Non-ST elevation (NSTEMI) myocardial infarction: Secondary | ICD-10-CM | POA: Diagnosis present

## 2019-09-22 DIAGNOSIS — R578 Other shock: Secondary | ICD-10-CM | POA: Diagnosis not present

## 2019-09-22 LAB — CBC
HCT: 42 % (ref 36.0–46.0)
Hemoglobin: 13.3 g/dL (ref 12.0–15.0)
MCH: 28.8 pg (ref 26.0–34.0)
MCHC: 31.7 g/dL (ref 30.0–36.0)
MCV: 90.9 fL (ref 80.0–100.0)
Platelets: 262 10*3/uL (ref 150–400)
RBC: 4.62 MIL/uL (ref 3.87–5.11)
RDW: 17 % — ABNORMAL HIGH (ref 11.5–15.5)
WBC: 7.4 10*3/uL (ref 4.0–10.5)
nRBC: 0 % (ref 0.0–0.2)

## 2019-09-22 LAB — BASIC METABOLIC PANEL
Anion gap: 11 (ref 5–15)
BUN: 25 mg/dL — ABNORMAL HIGH (ref 8–23)
CO2: 28 mmol/L (ref 22–32)
Calcium: 8.7 mg/dL — ABNORMAL LOW (ref 8.9–10.3)
Chloride: 97 mmol/L — ABNORMAL LOW (ref 98–111)
Creatinine, Ser: 1.06 mg/dL — ABNORMAL HIGH (ref 0.44–1.00)
GFR calc Af Amer: 55 mL/min — ABNORMAL LOW (ref >60)
GFR calc non Af Amer: 47 mL/min — ABNORMAL LOW (ref >60)
Glucose, Bld: 107 mg/dL — ABNORMAL HIGH (ref 70–99)
Potassium: 4.2 mmol/L (ref 3.5–5.1)
Sodium: 136 mmol/L (ref 135–145)

## 2019-09-22 LAB — PHOSPHORUS: Phosphorus: 3.2 mg/dL (ref 2.5–4.6)

## 2019-09-22 LAB — MAGNESIUM: Magnesium: 1.7 mg/dL (ref 1.7–2.4)

## 2019-09-22 MED ORDER — MAGNESIUM OXIDE 400 (241.3 MG) MG PO TABS
400.0000 mg | ORAL_TABLET | Freq: Once | ORAL | Status: AC
  Administered 2019-09-22: 400 mg via ORAL
  Filled 2019-09-22: qty 1

## 2019-09-22 MED ORDER — MAGNESIUM SULFATE 2 GM/50ML IV SOLN
2.0000 g | Freq: Once | INTRAVENOUS | Status: DC
  Filled 2019-09-22: qty 50

## 2019-09-22 MED ORDER — MAGNESIUM OXIDE 400 (241.3 MG) MG PO TABS
400.0000 mg | ORAL_TABLET | Freq: Once | ORAL | Status: AC
  Administered 2019-09-22: 12:00:00 400 mg via ORAL
  Filled 2019-09-22: qty 1

## 2019-09-22 NOTE — Progress Notes (Signed)
Physical Therapy Treatment Patient Details Name: Tiffany Charles MRN: [id number] DOB: 22-Sep-1929 Today's Date: 09/22/2019    History of Present Illness 86 year old female with PMH as below, which is significant for Atrial fibrillation on Xarelto, COPD, CAD, and HTN. She was recently admitted to [facility] and treated for cellulitis with keflex and doxycycline. She presented to [facility] ED 4/8 hypoxemic and hypotensive, after being found altered at SNF with coffee-ground emesis beside her bed. Intubated 4/8-4/11.    PT Comments    Pt admitted with above diagnosis. Pt was able to ambulate with RW with min guard to min assist.  Pt needs cues for safety as sher is still confused.  Pt  progressing but continues to need 24 hour care due to poor overall safety awaraeness and confusion.    Pt currently with functional limitations due to balance and endurance deficits. Pt will benefit from skilled PT to increase their independence and safety with mobility to allow discharge to the venue listed below.     Follow Up Recommendations  SNF     Equipment Recommendations  Other (comment)(defer to post acute)    Recommendations for Other Services       Precautions / Restrictions Precautions Precautions: Fall Restrictions Weight Bearing Restrictions: No    Mobility  Bed Mobility Overal bed mobility: Needs Assistance Bed Mobility: Supine to Sit     Supine to sit: Min assist     General bed mobility comments: Needed cues and assist.  Pt needed encouragement to get OOB.  Transfers Overall transfer level: Needs assistance Equipment used: Rolling walker (2 wheeled) Transfers: Sit to/from Stand Sit to Stand: Min assist         General transfer comment: minA for power up and for initial standing balance, cues for hand placeement  Ambulation/Gait Ambulation/Gait assistance: Min guard;Min assist Gait Distance (Feet): 150 Feet Assistive device: Rolling walker (2 wheeled) Gait  Pattern/deviations: Step-to pattern;Decreased stride length;Drifts right/left;Trunk flexed;Wide base of support Gait velocity: slower Gait velocity interpretation: <1.31 ft/sec, indicative of household ambulator General Gait Details:  close assist/guard for safety, max encouragment to encourage/direct   Stairs             Wheelchair Mobility    Modified Rankin (Stroke Patients Only)       Balance Overall balance assessment: Needs assistance Sitting-balance support: Feet unsupported;Bilateral upper extremity supported Sitting balance-Leahy Scale: Fair     Standing balance support: Bilateral upper extremity supported Standing balance-Leahy Scale: Poor Standing balance comment: Pt able to stand statically but needs UE support with RW.                             Cognition Arousal/Alertness: Awake/alert Behavior During Therapy: Restless;Flat affect;WFL for tasks assessed/performed Overall Cognitive Status: No family/caregiver present to determine baseline cognitive functioning Area of Impairment: Orientation;Attention;Memory;Following commands;Safety/judgement;Awareness;Problem solving                 Orientation Level: Disoriented to;Place;Time;Situation Current Attention Level: Focused Memory: Decreased short-term memory Following Commands: Follows one step commands inconsistently;Follows one step commands with increased time Safety/Judgement: Decreased awareness of safety;Decreased awareness of deficits Awareness: Intellectual Problem Solving: Decreased initiation;Requires verbal cues;Slow processing;Difficulty sequencing;Requires tactile cues General Comments: Pt follows ~75% of commands with multimodal cues.       Exercises Other Exercises Other Exercises: warm up ROM exercise of UE and LE's prior to mobility    General Comments  Pertinent Vitals/Pain Pain Assessment: No/denies pain    Home Living                       Prior Function            PT Goals (current goals can now be found in the care plan section) Acute Rehab PT Goals Patient Stated Goal: pt unable to state  goals Progress towards PT goals: Progressing toward goals    Frequency    Min 2X/week      PT Plan Current plan remains appropriate    Co-evaluation              AM-PAC PT "6 Clicks" Mobility   Outcome Measure  Help needed turning from your back to your side while in a flat bed without using bedrails?: A Little Help needed moving from lying on your back to sitting on the side of a flat bed without using bedrails?: A Little Help needed moving to and from a bed to a chair (including a wheelchair)?: A Little Help needed standing up from a chair using your arms (e.g., wheelchair or bedside chair)?: A Little Help needed to walk in hospital room?: A Little Help needed climbing 3-5 steps with a railing? : A Little 6 Click Score: 18    End of Session Equipment Utilized During Treatment: Gait belt Activity Tolerance: Patient tolerated treatment well;Patient limited by fatigue Patient left: with call bell/phone within reach;in chair;with chair alarm set Nurse Communication: Mobility status PT Visit Diagnosis: Unsteadiness on feet (R26.81);Muscle weakness (generalized) (M62.81)     Time: [id number] PT Time Calculation (min) (ACUTE ONLY): 14 min  Charges:  $Gait Training: 8-22 mins                     Nashanti Duquette W,PT [place] Pager:  [phone]  Office:  [place] 09/22/2019, 12:21 PM

## 2019-09-22 NOTE — Plan of Care (Signed)

## 2019-09-22 NOTE — Progress Notes (Signed)
PROGRESS NOTE    Tiffany Charles  KGY:[id number] DOB: 02-03-1930 DOA: 09/01/2019 PCP: [name], MD     Brief Narrative:  86 year old WF PMHx Permanent A. fib on Xarelto, COPD, coronary disease, hypertension, chronic diastolic CHF, hyperlipidemia, pulmonary hypertension, skin cancer   Presented to the emergency department with complaints of altered mental status, coffee-ground emesis at skilled nursing facility. Presentation, she was hypoxic, hypotensive, confused needing intubation for airway protection. INR was more than 10. CBC showed hemoglobin of 5.5. She was given Kcentra and blood transfusion. GI consulted. CT abdomen pelvis on admission no acute GI bleed, large amount of stool at the level of patient's rectum, Patient was admitted under PCCM service. Patient was extubated on 4/11. EGD deferred due to advanced age/comorbidities.   Medically stable for discharge. Awaiting family to fill out SNF paperwork. See TOC notes.     Subjective: 4/29 A/O x1 (does not know where, when, why).  Follows commands very friendly   Assessment & Plan:   Principal Problem:   Hemorrhagic shock [facility]) Active Problems:   Permanent atrial fibrillation   COPD - PFTs in June 2014 with MET test   CAD, RCA BMS '09, 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 low risk Myoview Feb 2013   Chronic anticoagulation- Xarelto   Goals of care, counseling/discussion   Chronic diastolic (congestive) heart failure (HCC)   Weakness   Mild pulmonary hypertension (HCC)   Occipital stroke (HCC)   Acute liver failure   Acute hypoxemic respiratory failure (HCC)   Upper GI bleed   Palliative care by specialist   Protein-calorie malnutrition, severe   Pressure injury of skin   Acute respiratory failure with hypoxia (HCC)   NSTEMI (non-ST elevated myocardial infarction) ([facility])   Ischemic cardiomyopathy   Atrial fibrillation with RVR (HCC)   Transaminitis   HLD (hyperlipidemia)   PVD (peripheral vascular  disease) (HCC)   Stage I decubitus ulcer and pressure area   Hemorrhagic shock:  -Secondary to upper GI bleed. She was on Xarelto at home. Reversed with Kcentra, platelets transfusion, status post 4 units of PRBC. GI was consulted. EGD deferred due to age/comorbidities.  -Protonix 40 mg bid Recent Labs  Lab 09/21/19 0537 09/22/19 0353  HGB 12.5 13.3  -Stable  Acute respiratory failure with hypoxia/Aspiration pneumonia -On presentation, she was intubated for airway protection.  -4/11 extubated  -Completed round of antibiotics for aspiration pneumonia  NSTEMI/Ischemic Cardiomyopathy/Acute on Chronic systolic CHF   Troponin was elevated on presentation. Echocardiogram showed EF 35 to 40%, regional wall motion abnormalities, RV systolic function severely reduced, severely dilated left atria, right atria moderate MR mild to moderate TR. Cardiology was consulted, no intervention planned. Continue lasix   A. fib with RVR:  -Currently NSR  -Not a candidate for anticoagulation    AKI: Resolved  Transaminitis: Most likely secondary to shock liver on presentation versus liver congestion due to CHF. LFTs trended down. Hepatitis panel negative.  Dysphagia:Dysphagia diet. Speech therapy following. She was treated with Unasyn for aspiration pneumonia.  Hyperlipidemia: Continue Lipitor  Acute metabolic encephalopathy: Multifactorial cause. Continue supportive care. History of CVA . History of dementia diagnosed about 6 to 8 months ago.  Stable   Peripheral vascular disease/chronic left foot wound: S/p Percutaneous transluminal angioplasty and stent placementleftsuperficial femoral artery and popliteal on 3/17. Daily application of small foam dressing for left foot wound  Stage I medial buttocks wound Pressure Injury 09/20/19 Buttocks Medial Stage 1 -  Intact skin with non-blanchable redness of  a localized area usually over a bony prominence. (Active)  09/20/19 0028   Location: Buttocks  Location Orientation: Medial  Staging: Stage 1 -  Intact skin with non-blanchable redness of a localized area usually over a bony prominence.  Wound Description (Comments):   Present on Admission:    Hypomagnesmia -Magnesium goal> 2 -Magnesium IV 2 g    Constipation: Lactulose PRN  Goals of care: DNR/DNI. Palliative care was consulted. Recommend outpatient follow-up with palliative care.     DVT prophylaxis: SCD Code Status: DNR Family Communication:  Disposition Plan:  1.  Where the patient is from 2.  Anticipated d/c place. 3.  Barriers to d/c Per EMR APS has been contacted, and abandonment papers have been filled out.  Awaiting guardian ad litem to be appointed in order for patient to be placed to SNF   Consultants:  Cardiology GI PCCM Palliative care   Procedures/Significant Events:    I have personally reviewed and interpreted all radiology studies and my findings are as above.  VENTILATOR SETTINGS:    Cultures   Antimicrobials: Anti-infectives (From admission, onward)   Start     Dose/Rate Stop   09/09/19 1800  amoxicillin-clavulanate (AUGMENTIN) 500-125 MG per tablet 500 mg     500 mg 09/12/19 2003   09/06/19 1430  Ampicillin-Sulbactam (UNASYN) 3 g in sodium chloride 0.9 % 100 mL IVPB  Status:  Discontinued     3 g 200 mL/hr over 30 Minutes 09/09/19 1353   09/02/19 2030  ceFEPIme (MAXIPIME) 2 g in sodium chloride 0.9 % 100 mL IVPB  Status:  Discontinued     2 g 200 mL/hr over 30 Minutes 09/03/19 0900   09/01/19 2043  vancomycin variable dose per unstable renal function (pharmacist dosing)  Status:  Discontinued      09/03/19 0900   09/01/19 1945  ceFEPIme (MAXIPIME) 2 g in sodium chloride 0.9 % 100 mL IVPB     2 g 200 mL/hr over 30 Minutes 09/01/19 2051   09/01/19 1945  metroNIDAZOLE (FLAGYL) IVPB 500 mg     500 mg 100 mL/hr over 60 Minutes 09/02/19 0129   09/01/19 1945  vancomycin (VANCOCIN) IVPB 1000 mg/200 mL premix      1,000 mg 200 mL/hr over 60 Minutes 09/02/19 0129       Devices    LINES / TUBES:      Continuous Infusions: . sodium chloride 10 mL/hr at 09/08/19 2300     Objective: Vitals:   09/21/19 1116 09/21/19 2006 09/22/19 0109 09/22/19 0538  BP: 117/66 126/70  (!) 155/83  Pulse: 78 75  79  Resp: _0 Temp: 98.5 F (36.9 C) 98.2 F (36.8 C)  98.1 F (36.7 C)  TempSrc: Oral Oral  Oral  SpO2: 96% 98%  95%  Weight:   54.2 kg   Height:        Intake/Output Summary (Last 24 hours) at 09/22/2019 0758 Last data filed at 09/22/2019 0540 Gross per 24 hour  Intake 420 ml  Output 901 ml  Net -481 ml   Filed Weights   09/20/19 0400 09/21/19 0400 09/22/19 0109  Weight: 58.1 kg 55.2 kg 54.2 kg   Physical Exam:  General: A/O x1 (does not know where, when, why), pleasant, follows commands, no acute respiratory distress Eyes: negative scleral hemorrhage, negative anisocoria, negative icterus ENT: Negative Runny nose, negative gingival bleeding, Neck:  Negative scars, masses, torticollis, lymphadenopathy, JVD Lungs: Clear to auscultation bilaterally without wheezes or crackles  Cardiovascular: Regular rate and rhythm without murmur gallop or rub normal S1 and S2 Abdomen: negative abdominal pain, nondistended, positive soft, bowel sounds, no rebound, no ascites, no appreciable mass Extremities: No significant cyanosis, clubbing, or edema bilateral lower extremities Skin: Negative rashes, lesions, ulcers Psychiatric: Unable to assess secondary to altered mental status Central nervous system:  Cranial nerves II through XII intact, tongue/uvula midline, all extremities muscle strength 5/5, sensation intact throughout, negative dysarthria, negative expressive aphasia, negative receptive aphasia.  .     Data Reviewed: Care during the described time interval was provided by me .  I have reviewed this patient's available data, including medical history, events of note, physical  examination, and all test results as part of my evaluation.  CBC: Recent Labs  Lab 09/21/19 0537 09/22/19 0353  WBC 6.6 7.4  HGB 12.5 13.3  HCT 39.4 42.0  MCV 90.4 90.9  PLT 268 027   Basic Metabolic Panel: Recent Labs  Lab 09/21/19 0537 09/22/19 0353  NA 136 136  K 4.2 4.2  CL 100 97*  CO2 28 28  GLUCOSE 95 107*  BUN 24* 25*  CREATININE 0.80 1.06*  CALCIUM 8.7* 8.7*  MG  --  1.7  PHOS  --  3.2   GFR: Estimated Creatinine Clearance: 32 mL/min (A) (by C-G formula based on SCr of 1.06 mg/dL (H)). Liver Function Tests: No results for input(s): AST, ALT, ALKPHOS, BILITOT, PROT, ALBUMIN in the last 168 hours. No results for input(s): LIPASE, AMYLASE in the last 168 hours. No results for input(s): AMMONIA in the last 168 hours. Coagulation Profile: No results for input(s): INR, PROTIME in the last 168 hours. Cardiac Enzymes: No results for input(s): CKTOTAL, CKMB, CKMBINDEX, TROPONINI in the last 168 hours. BNP (last 3 results) No results for input(s): PROBNP in the last 8760 hours. HbA1C: No results for input(s): HGBA1C in the last 72 hours. CBG: Recent Labs  Lab 09/16/19 2327 09/17/19 0343 09/17/19 0744 09/17/19 1121 09/17/19 1734  GLUCAP 112* 101* 106* 129* 81   Lipid Profile: No results for input(s): CHOL, HDL, LDLCALC, TRIG, CHOLHDL, LDLDIRECT in the last 72 hours. Thyroid Function Tests: No results for input(s): TSH, T4TOTAL, FREET4, T3FREE, THYROIDAB in the last 72 hours. Anemia Panel: No results for input(s): VITAMINB12, FOLATE, FERRITIN, TIBC, IRON, RETICCTPCT in the last 72 hours. Sepsis Labs: No results for input(s): PROCALCITON, LATICACIDVEN in the last 168 hours.  Recent Results (from the past 240 hour(s))  SARS CORONAVIRUS 2 (TAT 6-24 HRS) Nasopharyngeal Nasopharyngeal Swab     Status: None   Collection Time: 09/14/19  3:57 PM   Specimen: Nasopharyngeal Swab  Result Value Ref Range Status   SARS Coronavirus 2 NEGATIVE NEGATIVE Final     Comment: (NOTE) SARS-CoV-2 target nucleic acids are NOT DETECTED. The SARS-CoV-2 RNA is generally detectable in upper and lower respiratory specimens during the acute phase of infection. Negative results do not preclude SARS-CoV-2 infection, do not rule out co-infections with other pathogens, and should not be used as the sole basis for treatment or other patient management decisions. Negative results must be combined with clinical observations, patient history, and epidemiological information. The expected result is Negative. Fact Sheet for Patients: SugarRoll.be Fact Sheet for Healthcare Providers: https://www.-mathews.com/ This test is not yet approved or cleared by the [place] FDA and  has been authorized for detection and/or diagnosis of SARS-CoV-2 by FDA under an Emergency Use Authorization (EUA). This EUA will remain  in effect (meaning this test can be used)  for the duration of the COVID-19 declaration under Section 56 4(b)(1) of the Act, 21 U.S.C. section 360bbb-3(b)(1), unless the authorization is terminated or revoked sooner. Performed at [facility], [place] [address], [place], [place] [address]          Radiology Studies: No results found.      Scheduled Meds: . atorvastatin  20 mg Oral q1800  . chlorhexidine  15 mL Mouth Rinse BID  . feeding supplement (ENSURE ENLIVE)  237 mL Oral BID BM  . folic acid  1 mg Oral Daily  . furosemide  40 mg Oral Daily  . mouth rinse  15 mL Mouth Rinse q12n4p  . metoprolol tartrate  50 mg Oral BID  . multivitamin with minerals  1 tablet Oral Daily  . pantoprazole  40 mg Oral BID  . senna-docusate  1 tablet Oral BID  . tamsulosin  0.4 mg Oral Daily   Continuous Infusions: . sodium chloride 10 mL/hr at 09/08/19 2300     LOS: 21 days    Time spent:40 min    Jalina Blowers, Geraldo Docker, MD [facility] Pager [phone]  If 7PM-7AM, please contact  night-coverage [email or web address] Password [facility] 09/22/2019, 7:58 AM

## 2019-09-23 LAB — CBC
HCT: 41.2 % (ref 36.0–46.0)
Hemoglobin: 13.2 g/dL (ref 12.0–15.0)
MCH: 28.7 pg (ref 26.0–34.0)
MCHC: 32 g/dL (ref 30.0–36.0)
MCV: 89.6 fL (ref 80.0–100.0)
Platelets: 278 10*3/uL (ref 150–400)
RBC: 4.6 MIL/uL (ref 3.87–5.11)
RDW: 17 % — ABNORMAL HIGH (ref 11.5–15.5)
WBC: 8.9 10*3/uL (ref 4.0–10.5)
nRBC: 0 % (ref 0.0–0.2)

## 2019-09-23 LAB — MAGNESIUM: Magnesium: 1.8 mg/dL (ref 1.7–2.4)

## 2019-09-23 LAB — BASIC METABOLIC PANEL
Anion gap: 11 (ref 5–15)
BUN: 25 mg/dL — ABNORMAL HIGH (ref 8–23)
CO2: 26 mmol/L (ref 22–32)
Calcium: 8.7 mg/dL — ABNORMAL LOW (ref 8.9–10.3)
Chloride: 97 mmol/L — ABNORMAL LOW (ref 98–111)
Creatinine, Ser: 0.92 mg/dL (ref 0.44–1.00)
GFR calc Af Amer: >60 mL/min (ref >60)
GFR calc non Af Amer: 56 mL/min — ABNORMAL LOW (ref >60)
Glucose, Bld: 96 mg/dL (ref 70–99)
Potassium: 4.3 mmol/L (ref 3.5–5.1)
Sodium: 134 mmol/L — ABNORMAL LOW (ref 135–145)

## 2019-09-23 LAB — PHOSPHORUS: Phosphorus: 3.4 mg/dL (ref 2.5–4.6)

## 2019-09-23 NOTE — Progress Notes (Signed)
OT Cancellation Note  Patient Details Name: Tiffany Charles MRN: [id number] DOB: Dec 26, 1929   Cancelled Treatment:    Reason Eval/Treat Not Completed: Fatigue/lethargy limiting ability to participate;Other (comment) pt reports fatigue declining OT session this morning. Will check back as time allows for skilled OT session.  Lanier Clam., COTA/L Acute Rehabilitation Services [phone] [place] 09/23/2019, 8:19 AM

## 2019-09-23 NOTE — Progress Notes (Addendum)
Occupational Therapy Treatment Patient Details Name: Tiffany Charles MRN: [id number] DOB: 1929/07/10 Today's Date: 09/23/2019    History of present illness 86 year old female with PMH as below, which is significant for Atrial fibrillation on Xarelto, COPD, CAD, and HTN. She was recently admitted to [facility] and treated for cellulitis with keflex and doxycycline. She presented to [facility] ED 4/8 hypoxemic and hypotensive, after being found altered at SNF with coffee-ground emesis beside her bed. Intubated 4/8-4/11.   OT comments  Pt making steady progress towards OT goals this session. Pt continues to present with cognitive impairments, generalized weakness and decreased activity tolerance limiting pts ability to engage in ADLs. Overall, pt required MIN A- min guard for household distance functional mobility with RW, MIN A for toileting tasks, and MIN A for LB dressing. Pts son present during session stating he can now provide 24/7 assist at home and feels as though home is the best place for pt to heal. Updated DC and DME recs as indicated below as pt has made functional gains towards OT goals but continues to require most assist for safety awareness. Pt benefits from multimodal cues and increased time to complete ADLs. Son able to verbalize understanding of pts current level of assist needed. Will let OTR know about change in POC. Will continue to follow acutely for OT needs.    Follow Up Recommendations  Home health OT;Supervision/Assistance - 24 hour    Equipment Recommendations  3 in 1 bedside commode    Recommendations for Other Services      Precautions / Restrictions Precautions Precautions: Fall Restrictions Weight Bearing Restrictions: No       Mobility Bed Mobility Overal bed mobility: Needs Assistance Bed Mobility: Sit to Supine       Sit to supine: Min guard   General bed mobility comments: pt returned self to supine with MIN guard for safety  Transfers Overall  transfer level: Needs assistance Equipment used: Rolling walker (2 wheeled) Transfers: Sit to/from Stand Sit to Stand: Min assist         General transfer comment: light MIN A to power nto standing    Balance Overall balance assessment: Needs assistance Sitting-balance support: Feet unsupported;Bilateral upper extremity supported Sitting balance-Leahy Scale: Fair     Standing balance support: Single extremity supported;During functional activity Standing balance-Leahy Scale: Fair Standing balance comment: close min guard for dynamic tasks at sink                           ADL either performed or assessed with clinical judgement   ADL Overall ADL's : Needs assistance/impaired Eating/Feeding: Supervision/ safety;Sitting   Grooming: Wash/dry hands;Standing;Cueing for sequencing;Min guard Grooming Details (indicate cue type and reason): multimodal cues to initiate task; MIN guard for balance             Lower Body Dressing: Minimal assistance;Sit to/from stand Lower Body Dressing Details (indicate cue type and reason): pants donned around ankles upon OTA arrival; MINA to pull pants up to waist line Toilet Transfer: Minimal assistance;Regular Toilet;Ambulation;RW;BSC;Cueing for safety;Cueing for sequencing;Grab bars Toilet Transfer Details (indicate cue type and reason): pt completed x2 toilet transfers both via functional mobility; MIN A for balance and RW mgmt. Pt requires short, concise directional  cues to sequence task Toileting- Clothing Manipulation and Hygiene: Total assistance;Sit to/from stand;Minimal assistance Toileting - Clothing Manipulation Details (indicate cue type and reason): pt able to complete anterior pericare in standing with MIN A  clothing mgmt and balance, but ultimately required total A to complete posterior pericare d/t cleanliness   Tub/Shower Transfer Details (indicate cue type and reason): son reports shower seat at home Functional  mobility during ADLs: Minimal assistance;Rolling walker;Cueing for safety;Cueing for sequencing General ADL Comments: pt continues to present with cognitive impairments, decreased activity tolerance and generalized weakness. session focus on functional mobility with RW, standing grooming tasks, LB dressing toileting.     Vision       Perception     Praxis      Cognition Arousal/Alertness: Awake/alert Behavior During Therapy: Flat affect;WFL for tasks assessed/performed Overall Cognitive Status: Impaired/Different from baseline Area of Impairment: Orientation;Attention;Memory;Following commands;Safety/judgement;Awareness;Problem solving                 Orientation Level: Disoriented to;Place;Time;Situation Current Attention Level: Focused Memory: Decreased short-term memory Following Commands: Follows one step commands inconsistently;Follows one step commands with increased time Safety/Judgement: Decreased awareness of safety;Decreased awareness of deficits Awareness: Intellectual Problem Solving: Decreased initiation;Requires verbal cues;Slow processing;Difficulty sequencing;Requires tactile cues General Comments: pt responds well to short, direct verbal cues as well as tactile cues.        Exercises     Shoulder Instructions       General Comments pt son present during seession requesting to take pt home as son now reports he can provided 24/7 assist. discussed DME recs and helpful technique for cueing to best assist pt    Pertinent Vitals/ Pain       Pain Assessment: No/denies pain  Home Living                                          Prior Functioning/Environment              Frequency  Min 2X/week        Progress Toward Goals  OT Goals(current goals can now be found in the care plan section)  Progress towards OT goals: Progressing toward goals  Acute Rehab OT Goals Patient Stated Goal: pt unable to state  goals OT Goal  Formulation: Patient unable to participate in goal setting Time For Goal Achievement: 10/04/19 Potential to Achieve Goals: [place] Discharge plan needs to be updated;Frequency needs to be updated    Co-evaluation                 AM-PAC OT "6 Clicks" Daily Activity     Outcome Measure   Help from another person eating meals?: A Little Help from another person taking care of personal grooming?: A Little Help from another person toileting, which includes using toliet, bedpan, or urinal?: A Lot Help from another person bathing (including washing, rinsing, drying)?: A Lot Help from another person to put on and taking off regular upper body clothing?: A Little Help from another person to put on and taking off regular lower body clothing?: A Little 6 Click Score: 16    End of Session Equipment Utilized During Treatment: Gait belt;Rolling walker;Other (comment)(BSC)  OT Visit Diagnosis: Unsteadiness on feet (R26.81);Muscle weakness (generalized) (M62.81);Other symptoms and signs involving cognitive function   Activity Tolerance Patient tolerated treatment well   Patient Left in chair;with call bell/phone within reach;with chair alarm set;with family/visitor present   Nurse Communication Mobility status        Time: 1110-1143 OT Time Calculation (min): 33 min  Charges: OT General Charges $OT Visit: 1  Visit OT Treatments $Self Care/Home Management : 8-22 mins $Therapeutic Activity: 8-22 mins  Lanier Clam., COTA/L Acute Rehabilitation Services [phone] [place] 09/23/2019, 2:40 PM

## 2019-09-23 NOTE — Plan of Care (Signed)

## 2019-09-23 NOTE — Progress Notes (Signed)
PROGRESS NOTE    Tiffany Charles  TOI:[id number] DOB: [birth year] DOA: 09/01/2019 PCP: [name], MD     Brief Narrative:  86 year old WF PMHx Permanent A. fib on Xarelto, COPD, coronary disease, hypertension, chronic diastolic CHF, hyperlipidemia, pulmonary hypertension, skin cancer   Presented to the emergency department with complaints of altered mental status, coffee-ground emesis at skilled nursing facility. Presentation, she was hypoxic, hypotensive, confused needing intubation for airway protection. INR was more than 10. CBC showed hemoglobin of 5.5. She was given Kcentra and blood transfusion. GI consulted. CT abdomen pelvis on admission no acute GI bleed, large amount of stool at the level of patient's rectum, Patient was admitted under PCCM service. Patient was extubated on 4/11. EGD deferred due to advanced age/comorbidities.   Medically stable for discharge. Awaiting family to fill out SNF paperwork. See TOC notes.     Subjective: 4/30 Sleepy but arousable, A/O x1 (does not know where, when, why).  Follows commands pleasant.    Assessment & Plan:   Principal Problem:   Hemorrhagic shock [facility]) Active Problems:   Permanent atrial fibrillation   COPD - PFTs in June 2014 with MET test   CAD, RCA BMS '09, 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 low risk Myoview Feb 2013   Chronic anticoagulation- Xarelto   Goals of care, counseling/discussion   Chronic diastolic (congestive) heart failure (HCC)   Weakness   Mild pulmonary hypertension (HCC)   Occipital stroke (HCC)   Acute liver failure   Acute hypoxemic respiratory failure (HCC)   Upper GI bleed   Palliative care by specialist   Protein-calorie malnutrition, severe   Pressure injury of skin   Acute respiratory failure with hypoxia (HCC)   NSTEMI (non-ST elevated myocardial infarction) ([place])   Ischemic cardiomyopathy   Atrial fibrillation with RVR (HCC)   Transaminitis   HLD (hyperlipidemia)   PVD  (peripheral vascular disease) (HCC)   Stage I decubitus ulcer and pressure area   Hemorrhagic shock:  -Secondary to upper GI bleed. She was on Xarelto at home. Reversed with Kcentra, platelets transfusion, status post 4 units of PRBC. GI was consulted. EGD deferred due to age/comorbidities.  -Protonix 40 mg bid Recent Labs  Lab 09/21/19 0537 09/22/19 0353 09/23/19 0434  HGB 12.5 13.3 13.2  -Stable  Acute respiratory failure with hypoxia/Aspiration pneumonia -On presentation, she was intubated for airway protection.  -4/11 extubated  -Completed round of antibiotics for aspiration pneumonia  NSTEMI/Ischemic Cardiomyopathy/Acute on Chronic systolic CHF   Troponin was elevated on presentation. Echocardiogram showed EF 35 to 40%, regional wall motion abnormalities, RV systolic function severely reduced, severely dilated left atria, right atria moderate MR mild to moderate TR. Cardiology was consulted, no intervention planned. Continue lasix   A. fib with RVR:  -Currently NSR  -Not a candidate for anticoagulation    AKI: Resolved  Transaminitis: Most likely secondary to shock liver on presentation versus liver congestion due to CHF. LFTs trended down. Hepatitis panel negative.  Dysphagia:Dysphagia diet. Speech therapy following. She was treated with Unasyn for aspiration pneumonia.  Hyperlipidemia: Continue Lipitor  Acute metabolic encephalopathy: Multifactorial cause. Continue supportive care. History of CVA . History of dementia diagnosed about 6 to 8 months ago.  Stable   Peripheral vascular disease/chronic left foot wound: S/p Percutaneous transluminal angioplasty and stent placementleftsuperficial femoral artery and popliteal on 3/17. Daily application of small foam dressing for left foot wound  Stage I medial buttocks wound Pressure Injury 09/20/19 Buttocks Medial Stage 1 -  Intact skin with non-blanchable redness of a localized area usually over a  bony prominence. (Active)  09/20/19 0028  Location: Buttocks  Location Orientation: Medial  Staging: Stage 1 -  Intact skin with non-blanchable redness of a localized area usually over a bony prominence.  Wound Description (Comments):   Present on Admission:    Hypomagnesmia -Magnesium goal> 2 -Magnesium IV 2 g    Constipation: Lactulose PRN  Goals of care: DNR/DNI. Palliative care was consulted. Recommend outpatient follow-up with palliative care.     DVT prophylaxis: SCD Code Status: DNR Family Communication:  Disposition Plan:  1.  Where the patient is from 2.  Anticipated d/c place. 3.  Barriers to d/c Per EMR APS has been contacted, and abandonment papers have been filled out.  Awaiting guardian ad litem to be appointed in order for patient to be placed to SNF   Consultants:  Cardiology GI PCCM Palliative care   Procedures/Significant Events:    I have personally reviewed and interpreted all radiology studies and my findings are as above.  VENTILATOR SETTINGS:    Cultures   Antimicrobials: Anti-infectives (From admission, onward)   Start     Dose/Rate Stop   09/09/19 1800  amoxicillin-clavulanate (AUGMENTIN) 500-125 MG per tablet 500 mg     500 mg 09/12/19 2003   09/06/19 1430  Ampicillin-Sulbactam (UNASYN) 3 g in sodium chloride 0.9 % 100 mL IVPB  Status:  Discontinued     3 g 200 mL/hr over 30 Minutes 09/09/19 1353   09/02/19 2030  ceFEPIme (MAXIPIME) 2 g in sodium chloride 0.9 % 100 mL IVPB  Status:  Discontinued     2 g 200 mL/hr over 30 Minutes 09/03/19 0900   09/01/19 2043  vancomycin variable dose per unstable renal function (pharmacist dosing)  Status:  Discontinued      09/03/19 0900   09/01/19 1945  ceFEPIme (MAXIPIME) 2 g in sodium chloride 0.9 % 100 mL IVPB     2 g 200 mL/hr over 30 Minutes 09/01/19 2051   09/01/19 1945  metroNIDAZOLE (FLAGYL) IVPB 500 mg     500 mg 100 mL/hr over 60 Minutes 09/02/19 0129   09/01/19 1945   vancomycin (VANCOCIN) IVPB 1000 mg/200 mL premix     1,000 mg 200 mL/hr over 60 Minutes 09/02/19 0129       Devices    LINES / TUBES:      Continuous Infusions: . sodium chloride 10 mL/hr at 09/08/19 2300     Objective: Vitals:   09/22/19 1231 09/22/19 2038 09/23/19 0254 09/23/19 0645  BP: 123/68 124/72  (!) 142/59  Pulse: (!) 56 71  70  Resp: _0 Temp: 97.7 F (36.5 C) (!) 97.5 F (36.4 C)  97.6 F (36.4 C)  TempSrc: Oral Oral  Oral  SpO2: 99% 98%  98%  Weight:   53.2 kg   Height:        Intake/Output Summary (Last 24 hours) at 09/23/2019 0932 Last data filed at 09/23/2019 3474 Gross per 24 hour  Intake 240 ml  Output 125 ml  Net 115 ml   Filed Weights   09/21/19 0400 09/22/19 0109 09/23/19 0254  Weight: 55.2 kg 54.2 kg 53.2 kg   Physical Exam:  General: A/O x1 (does not know where, when, why), pleasant, follows commands, no acute respiratory distress Eyes: negative scleral hemorrhage, negative anisocoria, negative icterus ENT: Negative Runny nose, negative gingival bleeding, Neck:  Negative scars, masses, torticollis, lymphadenopathy, JVD Lungs:  Clear to auscultation bilaterally without wheezes or crackles Cardiovascular: Regular rate and rhythm without murmur gallop or rub normal S1 and S2 Abdomen: negative abdominal pain, nondistended, positive soft, bowel sounds, no rebound, no ascites, no appreciable mass Extremities: No significant cyanosis, clubbing, or edema bilateral lower extremities Skin: Negative rashes, lesions, ulcers Psychiatric: Unable to assess secondary to altered mental status Central nervous system:  Cranial nerves II through XII intact, tongue/uvula midline, all extremities muscle strength 5/5, sensation intact throughout, negative dysarthria, negative expressive aphasia, negative receptive aphasia.  .     Data Reviewed: Care during the described time interval was provided by me .  I have reviewed this patient's available  data, including medical history, events of note, physical examination, and all test results as part of my evaluation.  CBC: Recent Labs  Lab 09/21/19 0537 09/22/19 0353 09/23/19 0434  WBC 6.6 7.4 8.9  HGB 12.5 13.3 13.2  HCT 39.4 42.0 41.2  MCV 90.4 90.9 89.6  PLT 268 262 100   Basic Metabolic Panel: Recent Labs  Lab 09/21/19 0537 09/22/19 0353 09/23/19 0434  NA 136 136 134*  K 4.2 4.2 4.3  CL 100 97* 97*  CO2 _0 GLUCOSE 95 107* 96  BUN 24* 25* 25*  CREATININE 0.80 1.06* 0.92  CALCIUM 8.7* 8.7* 8.7*  MG  --  1.7 1.8  PHOS  --  3.2 3.4   GFR: Estimated Creatinine Clearance: 36.2 mL/min (by C-G formula based on SCr of 0.92 mg/dL). Liver Function Tests: No results for input(s): AST, ALT, ALKPHOS, BILITOT, PROT, ALBUMIN in the last 168 hours. No results for input(s): LIPASE, AMYLASE in the last 168 hours. No results for input(s): AMMONIA in the last 168 hours. Coagulation Profile: No results for input(s): INR, PROTIME in the last 168 hours. Cardiac Enzymes: No results for input(s): CKTOTAL, CKMB, CKMBINDEX, TROPONINI in the last 168 hours. BNP (last 3 results) No results for input(s): PROBNP in the last 8760 hours. HbA1C: No results for input(s): HGBA1C in the last 72 hours. CBG: Recent Labs  Lab 09/16/19 2327 09/17/19 0343 09/17/19 0744 09/17/19 1121 09/17/19 1734  GLUCAP 112* 101* 106* 129* 81   Lipid Profile: No results for input(s): CHOL, HDL, LDLCALC, TRIG, CHOLHDL, LDLDIRECT in the last 72 hours. Thyroid Function Tests: No results for input(s): TSH, T4TOTAL, FREET4, T3FREE, THYROIDAB in the last 72 hours. Anemia Panel: No results for input(s): VITAMINB12, FOLATE, FERRITIN, TIBC, IRON, RETICCTPCT in the last 72 hours. Sepsis Labs: No results for input(s): PROCALCITON, LATICACIDVEN in the last 168 hours.  Recent Results (from the past 240 hour(s))  SARS CORONAVIRUS 2 (TAT 6-24 HRS) Nasopharyngeal Nasopharyngeal Swab     Status: None    Collection Time: 09/14/19  3:57 PM   Specimen: Nasopharyngeal Swab  Result Value Ref Range Status   SARS Coronavirus 2 NEGATIVE NEGATIVE Final    Comment: (NOTE) SARS-CoV-2 target nucleic acids are NOT DETECTED. The SARS-CoV-2 RNA is generally detectable in upper and lower respiratory specimens during the acute phase of infection. Negative results do not preclude SARS-CoV-2 infection, do not rule out co-infections with other pathogens, and should not be used as the sole basis for treatment or other patient management decisions. Negative results must be combined with clinical observations, patient history, and epidemiological information. The expected result is Negative. Fact Sheet for Patients: SugarRoll.be Fact Sheet for Healthcare Providers: https://www.-mathews.com/ This test is not yet approved or cleared by the [place] FDA and  has been authorized for detection  and/or diagnosis of SARS-CoV-2 by FDA under an Emergency Use Authorization (EUA). This EUA will remain  in effect (meaning this test can be used) for the duration of the COVID-19 declaration under Section 56 4(b)(1) of the Act, 21 U.S.C. section 360bbb-3(b)(1), unless the authorization is terminated or revoked sooner. Performed at [facility], [place] [address] [place], [place] [address]          Radiology Studies: No results found.      Scheduled Meds: . atorvastatin  20 mg Oral q1800  . chlorhexidine  15 mL Mouth Rinse BID  . feeding supplement (ENSURE ENLIVE)  237 mL Oral BID BM  . folic acid  1 mg Oral Daily  . furosemide  40 mg Oral Daily  . mouth rinse  15 mL Mouth Rinse q12n4p  . metoprolol tartrate  50 mg Oral BID  . multivitamin with minerals  1 tablet Oral Daily  . pantoprazole  40 mg Oral BID  . senna-docusate  1 tablet Oral BID  . tamsulosin  0.4 mg Oral Daily   Continuous Infusions: . sodium chloride 10 mL/hr at 09/08/19 2300      LOS: 22 days    Time spent:40 min    Mirely Pangle, Geraldo Docker, MD [facility] Pager [phone]  If 7PM-7AM, please contact night-coverage [email or web address] Password [facility] 09/23/2019, 9:32 AM

## 2019-09-24 LAB — BASIC METABOLIC PANEL
Anion gap: 9 (ref 5–15)
BUN: 35 mg/dL — ABNORMAL HIGH (ref 8–23)
CO2: 28 mmol/L (ref 22–32)
Calcium: 8.7 mg/dL — ABNORMAL LOW (ref 8.9–10.3)
Chloride: 99 mmol/L (ref 98–111)
Creatinine, Ser: 0.99 mg/dL (ref 0.44–1.00)
GFR calc Af Amer: 59 mL/min — ABNORMAL LOW (ref >60)
GFR calc non Af Amer: 51 mL/min — ABNORMAL LOW (ref >60)
Glucose, Bld: 107 mg/dL — ABNORMAL HIGH (ref 70–99)
Potassium: 5.1 mmol/L (ref 3.5–5.1)
Sodium: 136 mmol/L (ref 135–145)

## 2019-09-24 LAB — CBC
HCT: 39.8 % (ref 36.0–46.0)
Hemoglobin: 12.7 g/dL (ref 12.0–15.0)
MCH: 29.1 pg (ref 26.0–34.0)
MCHC: 31.9 g/dL (ref 30.0–36.0)
MCV: 91.1 fL (ref 80.0–100.0)
Platelets: 257 10*3/uL (ref 150–400)
RBC: 4.37 MIL/uL (ref 3.87–5.11)
RDW: 17.2 % — ABNORMAL HIGH (ref 11.5–15.5)
WBC: 8.2 10*3/uL (ref 4.0–10.5)
nRBC: 0 % (ref 0.0–0.2)

## 2019-09-24 LAB — PHOSPHORUS: Phosphorus: 4 mg/dL (ref 2.5–4.6)

## 2019-09-24 LAB — MAGNESIUM: Magnesium: 1.9 mg/dL (ref 1.7–2.4)

## 2019-09-24 NOTE — TOC Progression Note (Signed)
Transition of Care [facility]) - Progression Note    Patient Details  Name: Tiffany Charles MRN: [id number] Date of Birth: March 02, 1930  [facility]) CM/SW [place], LCSW Phone Number: [phone] 09/24/2019, 1:25 PM  Clinical Narrative:     CSW was able to reach grandson [name] and notified him of the co pay days and he would need to pay $180 a day for 10 days before patient could be placed into [facility]. [name] stated that he would need to speak with his attorney on Monday to gain access to those funds. CSW made sure that [name] had weekday CSW contact information. [name] stated that he had contact information and would follow up on Monday.  TOC team will continue to follow for discharge planning needs.  Expected Discharge Plan: [place] Barriers to Discharge: Continued Medical Work up  Expected Discharge Plan and Services Expected Discharge Plan: [place] arrangements for the past 2 months: [place]                                       Social Determinants of Health (SDOH) Interventions    Readmission Risk Interventions No flowsheet data found.

## 2019-09-24 NOTE — Progress Notes (Signed)
PROGRESS NOTE    Tiffany Charles  GQQ:[id number] DOB: 09/20/1929 DOA: 09/01/2019 PCP: [name], MD     Brief Narrative:  86 year old WF PMHx Permanent A. fib on Xarelto, COPD, coronary disease, hypertension, chronic diastolic CHF, hyperlipidemia, pulmonary hypertension, skin cancer   Presented to the emergency department with complaints of altered mental status, coffee-ground emesis at skilled nursing facility. Presentation, she was hypoxic, hypotensive, confused needing intubation for airway protection. INR was more than 10. CBC showed hemoglobin of 5.5. She was given Kcentra and blood transfusion. GI consulted. CT abdomen pelvis on admission no acute GI bleed, large amount of stool at the level of patient's rectum, Patient was admitted under PCCM service. Patient was extubated on 4/11. EGD deferred due to advanced age/comorbidities.   Medically stable for discharge. Awaiting family to fill out SNF paperwork. See TOC notes.     Subjective: 5/1 alert, A/O x1 (does not know where, when, why).  Follows commands  Assessment & Plan:   Principal Problem:   Hemorrhagic shock [facility]) Active Problems:   Permanent atrial fibrillation   COPD - PFTs in June 2014 with MET test   CAD, RCA BMS '09, 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 low risk Myoview Feb 2013   Chronic anticoagulation- Xarelto   Goals of care, counseling/discussion   Chronic diastolic (congestive) heart failure (HCC)   Weakness   Mild pulmonary hypertension (HCC)   Occipital stroke (HCC)   Acute liver failure   Acute hypoxemic respiratory failure (HCC)   Upper GI bleed   Palliative care by specialist   Protein-calorie malnutrition, severe   Pressure injury of skin   Acute respiratory failure with hypoxia (HCC)   NSTEMI (non-ST elevated myocardial infarction) ([place])   Ischemic cardiomyopathy   Atrial fibrillation with RVR (HCC)   Transaminitis   HLD (hyperlipidemia)   PVD (peripheral vascular disease)  (HCC)   Stage I decubitus ulcer and pressure area   Hemorrhagic shock:  -Secondary to upper GI bleed. She was on Xarelto at home. Reversed with Kcentra, platelets transfusion, status post 4 units of PRBC. GI was consulted. EGD deferred due to age/comorbidities.  -Protonix 40 mg bid Recent Labs  Lab 09/21/19 0537 09/22/19 0353 09/23/19 0434 09/24/19 0355  HGB 12.5 13.3 13.2 12.7  -Stable  Acute respiratory failure with hypoxia/Aspiration pneumonia -On presentation, she was intubated for airway protection.  -4/11 extubated  -Completed round of antibiotics for aspiration pneumonia  NSTEMI/Ischemic Cardiomyopathy/Acute on Chronic systolic CHF   Troponin was elevated on presentation. Echocardiogram showed EF 35 to 40%, regional wall motion abnormalities, RV systolic function severely reduced, severely dilated left atria, right atria moderate MR mild to moderate TR. Cardiology was consulted, no intervention planned. Continue lasix   A. fib with RVR:  -Currently NSR  -Not a candidate for anticoagulation    AKI: Resolved  Transaminitis: Most likely secondary to shock liver on presentation versus liver congestion due to CHF. LFTs trended down. Hepatitis panel negative.  Dysphagia:Dysphagia diet. Speech therapy following. She was treated with Unasyn for aspiration pneumonia.  Hyperlipidemia: Continue Lipitor  Acute metabolic encephalopathy: Multifactorial cause. Continue supportive care. History of CVA . History of dementia diagnosed about 6 to 8 months ago.  Stable   Peripheral vascular disease/chronic left foot wound: S/p Percutaneous transluminal angioplasty and stent placementleftsuperficial femoral artery and popliteal on 3/17. Daily application of small foam dressing for left foot wound  Stage I medial buttocks wound Pressure Injury 09/20/19 Buttocks Medial Stage 1 -  Intact skin  with non-blanchable redness of a localized area usually over a bony  prominence. (Active)  09/20/19 0028  Location: Buttocks  Location Orientation: Medial  Staging: Stage 1 -  Intact skin with non-blanchable redness of a localized area usually over a bony prominence.  Wound Description (Comments):   Present on Admission:    Hypomagnesmia -Magnesium goal> 2 -Magnesium IV 2 g    Constipation: Lactulose PRN  Goals of care: DNR/DNI. Palliative care was consulted. Recommend outpatient follow-up with palliative care.     DVT prophylaxis: SCD Code Status: DNR Family Communication:  Disposition Plan:  1.  Where the patient is from 2.  Anticipated d/c place. 3.  Barriers to d/c Per EMR APS has been contacted, and abandonment papers have been filled out.  Awaiting guardian ad litem to be appointed in order for patient to be placed to SNF   Consultants:  Cardiology GI PCCM Palliative care   Procedures/Significant Events:    I have personally reviewed and interpreted all radiology studies and my findings are as above.  VENTILATOR SETTINGS:    Cultures   Antimicrobials: Anti-infectives (From admission, onward)   Start     Dose/Rate Stop   09/09/19 1800  amoxicillin-clavulanate (AUGMENTIN) 500-125 MG per tablet 500 mg     500 mg 09/12/19 2003   09/06/19 1430  Ampicillin-Sulbactam (UNASYN) 3 g in sodium chloride 0.9 % 100 mL IVPB  Status:  Discontinued     3 g 200 mL/hr over 30 Minutes 09/09/19 1353   09/02/19 2030  ceFEPIme (MAXIPIME) 2 g in sodium chloride 0.9 % 100 mL IVPB  Status:  Discontinued     2 g 200 mL/hr over 30 Minutes 09/03/19 0900   09/01/19 2043  vancomycin variable dose per unstable renal function (pharmacist dosing)  Status:  Discontinued      09/03/19 0900   09/01/19 1945  ceFEPIme (MAXIPIME) 2 g in sodium chloride 0.9 % 100 mL IVPB     2 g 200 mL/hr over 30 Minutes 09/01/19 2051   09/01/19 1945  metroNIDAZOLE (FLAGYL) IVPB 500 mg     500 mg 100 mL/hr over 60 Minutes 09/02/19 0129   09/01/19 1945  vancomycin  (VANCOCIN) IVPB 1000 mg/200 mL premix     1,000 mg 200 mL/hr over 60 Minutes 09/02/19 0129       Devices    LINES / TUBES:      Continuous Infusions: . sodium chloride 10 mL/hr at 09/08/19 2300     Objective: Vitals:   09/24/19 0104 09/24/19 0349 09/24/19 0521 09/24/19 0850  BP:  135/90  136/73  Pulse:  71  81  Resp:  20  18  Temp:  (!) 97.3 F (36.3 C) 97.6 F (36.4 C) 98.3 F (36.8 C)  TempSrc:  Oral Oral Oral  SpO2:  100%  95%  Weight: 55.1 kg     Height:        Intake/Output Summary (Last 24 hours) at 09/24/2019 1047 Last data filed at 09/24/2019 1033 Gross per 24 hour  Intake 360 ml  Output 0 ml  Net 360 ml   Filed Weights   09/22/19 0109 09/23/19 0254 09/24/19 0104  Weight: 54.2 kg 53.2 kg 55.1 kg   Physical Exam:  General: A/O x1 (does not know where, when, why), pleasant, follows commands, no acute respiratory distress Eyes: negative scleral hemorrhage, negative anisocoria, negative icterus ENT: Negative Runny nose, negative gingival bleeding, Neck:  Negative scars, masses, torticollis, lymphadenopathy, JVD Lungs: Clear to auscultation bilaterally   without wheezes or crackles Cardiovascular: Regular rate and rhythm without murmur gallop or rub normal S1 and S2 Abdomen: negative abdominal pain, nondistended, positive soft, bowel sounds, no rebound, no ascites, no appreciable mass Extremities: No significant cyanosis, clubbing, or edema bilateral lower extremities Skin: Negative rashes, lesions, ulcers Psychiatric: Unable to assess secondary to altered mental status Central nervous system:  Cranial nerves II through XII intact, tongue/uvula midline, all extremities muscle strength 5/5, sensation intact throughout, negative dysarthria, negative expressive aphasia, negative receptive aphasia.  .     Data Reviewed: Care during the described time interval was provided by me .  I have reviewed this patient's available data, including medical history,  events of note, physical examination, and all test results as part of my evaluation.  CBC: Recent Labs  Lab 09/21/19 0537 09/22/19 0353 09/23/19 0434 09/24/19 0355  WBC 6.6 7.4 8.9 8.2  HGB 12.5 13.3 13.2 12.7  HCT 39.4 42.0 41.2 39.8  MCV 90.4 90.9 89.6 91.1  PLT 268 262 278 878   Basic Metabolic Panel: Recent Labs  Lab 09/21/19 0537 09/22/19 0353 09/23/19 0434 09/24/19 0355  NA 136 136 134* 136  K 4.2 4.2 4.3 5.1  CL 100 97* 97* 99  CO2 _0 GLUCOSE 95 107* 96 107*  BUN 24* 25* 25* 35*  CREATININE 0.80 1.06* 0.92 0.99  CALCIUM 8.7* 8.7* 8.7* 8.7*  MG  --  1.7 1.8 1.9  PHOS  --  3.2 3.4 4.0   GFR: Estimated Creatinine Clearance: 34.8 mL/min (by C-G formula based on SCr of 0.99 mg/dL). Liver Function Tests: No results for input(s): AST, ALT, ALKPHOS, BILITOT, PROT, ALBUMIN in the last 168 hours. No results for input(s): LIPASE, AMYLASE in the last 168 hours. No results for input(s): AMMONIA in the last 168 hours. Coagulation Profile: No results for input(s): INR, PROTIME in the last 168 hours. Cardiac Enzymes: No results for input(s): CKTOTAL, CKMB, CKMBINDEX, TROPONINI in the last 168 hours. BNP (last 3 results) No results for input(s): PROBNP in the last 8760 hours. HbA1C: No results for input(s): HGBA1C in the last 72 hours. CBG: Recent Labs  Lab 09/17/19 1121 09/17/19 1734  GLUCAP 129* 81   Lipid Profile: No results for input(s): CHOL, HDL, LDLCALC, TRIG, CHOLHDL, LDLDIRECT in the last 72 hours. Thyroid Function Tests: No results for input(s): TSH, T4TOTAL, FREET4, T3FREE, THYROIDAB in the last 72 hours. Anemia Panel: No results for input(s): VITAMINB12, FOLATE, FERRITIN, TIBC, IRON, RETICCTPCT in the last 72 hours. Sepsis Labs: No results for input(s): PROCALCITON, LATICACIDVEN in the last 168 hours.  Recent Results (from the past 240 hour(s))  SARS CORONAVIRUS 2 (TAT 6-24 HRS) Nasopharyngeal Nasopharyngeal Swab     Status: None    Collection Time: 09/14/19  3:57 PM   Specimen: Nasopharyngeal Swab  Result Value Ref Range Status   SARS Coronavirus 2 NEGATIVE NEGATIVE Final    Comment: (NOTE) SARS-CoV-2 target nucleic acids are NOT DETECTED. The SARS-CoV-2 RNA is generally detectable in upper and lower respiratory specimens during the acute phase of infection. Negative results do not preclude SARS-CoV-2 infection, do not rule out co-infections with other pathogens, and should not be used as the sole basis for treatment or other patient management decisions. Negative results must be combined with clinical observations, patient history, and epidemiological information. The expected result is Negative. Fact Sheet for Patients: SugarRoll.be Fact Sheet for Healthcare Providers: https://www.-mathews.com/ This test is not yet approved or cleared by the [place] FDA and  has been authorized for detection and/or diagnosis of SARS-CoV-2 by FDA under an Emergency Use Authorization (EUA). This EUA will remain  in effect (meaning this test can be used) for the duration of the COVID-19 declaration under Section 56 4(b)(1) of the Act, 21 U.S.C. section 360bbb-3(b)(1), unless the authorization is terminated or revoked sooner. Performed at [facility], [address] [place], [place] [address]          Radiology Studies: No results found.      Scheduled Meds: . atorvastatin  20 mg Oral q1800  . chlorhexidine  15 mL Mouth Rinse BID  . feeding supplement (ENSURE ENLIVE)  237 mL Oral BID BM  . folic acid  1 mg Oral Daily  . furosemide  40 mg Oral Daily  . mouth rinse  15 mL Mouth Rinse q12n4p  . metoprolol tartrate  50 mg Oral BID  . multivitamin with minerals  1 tablet Oral Daily  . pantoprazole  40 mg Oral BID  . senna-docusate  1 tablet Oral BID  . tamsulosin  0.4 mg Oral Daily   Continuous Infusions: . sodium chloride 10 mL/hr at 09/08/19 2300      LOS: 23 days    Time spent:40 min    ,  J, MD [facility] Pager [phone]  If 7PM-7AM, please contact night-coverage [email or web address] Password [email or web address] 09/24/2019, 10:47 AM  

## 2019-09-25 LAB — PHOSPHORUS: Phosphorus: 3.3 mg/dL (ref 2.5–4.6)

## 2019-09-25 LAB — CBC
HCT: 40.8 % (ref 36.0–46.0)
Hemoglobin: 13.2 g/dL (ref 12.0–15.0)
MCH: 29.3 pg (ref 26.0–34.0)
MCHC: 32.4 g/dL (ref 30.0–36.0)
MCV: 90.7 fL (ref 80.0–100.0)
Platelets: 243 10*3/uL (ref 150–400)
RBC: 4.5 MIL/uL (ref 3.87–5.11)
RDW: 17.2 % — ABNORMAL HIGH (ref 11.5–15.5)
WBC: 7.6 10*3/uL (ref 4.0–10.5)
nRBC: 0 % (ref 0.0–0.2)

## 2019-09-25 LAB — BASIC METABOLIC PANEL
Anion gap: 9 (ref 5–15)
BUN: 26 mg/dL — ABNORMAL HIGH (ref 8–23)
CO2: 29 mmol/L (ref 22–32)
Calcium: 8.8 mg/dL — ABNORMAL LOW (ref 8.9–10.3)
Chloride: 99 mmol/L (ref 98–111)
Creatinine, Ser: 0.88 mg/dL (ref 0.44–1.00)
GFR calc Af Amer: >60 mL/min (ref >60)
GFR calc non Af Amer: 59 mL/min — ABNORMAL LOW (ref >60)
Glucose, Bld: 96 mg/dL (ref 70–99)
Potassium: 3.9 mmol/L (ref 3.5–5.1)
Sodium: 137 mmol/L (ref 135–145)

## 2019-09-25 LAB — MAGNESIUM: Magnesium: 1.7 mg/dL (ref 1.7–2.4)

## 2019-09-25 MED ORDER — LORAZEPAM 2 MG/ML IJ SOLN
INTRAMUSCULAR | Status: AC
  Administered 2019-09-25: 2 mg
  Filled 2019-09-25: qty 1

## 2019-09-25 MED ORDER — LORAZEPAM 2 MG/ML IJ SOLN: 1.0000 mg | Freq: Four times a day (QID) | INTRAMUSCULAR | Status: DC | PRN

## 2019-09-25 MED ORDER — LORAZEPAM 2 MG/ML IJ SOLN: 1.0000 mg | Freq: Three times a day (TID) | INTRAMUSCULAR | Status: DC

## 2019-09-25 MED ORDER — MAGNESIUM SULFATE 50 % IJ SOLN
3.0000 g | Freq: Once | INTRAVENOUS | Status: AC
  Administered 2019-09-25: 3 g via INTRAVENOUS
  Filled 2019-09-25: qty 6

## 2019-09-25 NOTE — Plan of Care (Signed)
  Problem: Elimination: Goal: Will not experience complications related to bowel motility Outcome: Progressing Goal: Will not experience complications related to urinary retention Outcome: Progressing   

## 2019-09-25 NOTE — Progress Notes (Signed)
Pt consistently tried to get out of bed and pull out tele, purewick and IV.Marland Kitchen Tele has been discontinued per MD

## 2019-09-25 NOTE — Progress Notes (Signed)
PROGRESS NOTE    Tiffany Charles  VHQ:[id number] DOB: 1930/01/29 DOA: 09/01/2019 PCP: [name], MD     Brief Narrative:  86 year old WF PMHx Permanent A. fib on Xarelto, COPD, coronary disease, hypertension, chronic diastolic CHF, hyperlipidemia, pulmonary hypertension, skin cancer   Presented to the emergency department with complaints of altered mental status, coffee-ground emesis at skilled nursing facility. Presentation, she was hypoxic, hypotensive, confused needing intubation for airway protection. INR was more than 10. CBC showed hemoglobin of 5.5. She was given Kcentra and blood transfusion. GI consulted. CT abdomen pelvis on admission no acute GI bleed, large amount of stool at the level of patient's rectum, Patient was admitted under PCCM service. Patient was extubated on 4/11. EGD deferred due to advanced age/comorbidities.   Medically stable for discharge. Awaiting family to fill out SNF paperwork. See TOC notes.     Subjective: 5/2 A/O x1 (does not know where, when, why).  Follows commands    Assessment & Plan:   Principal Problem:   Hemorrhagic shock [facility]) Active Problems:   Permanent atrial fibrillation   COPD - PFTs in June 2014 with MET test   CAD, RCA BMS '09, 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 low risk Myoview Feb 2013   Chronic anticoagulation- Xarelto   Goals of care, counseling/discussion   Chronic diastolic (congestive) heart failure (HCC)   Weakness   Mild pulmonary hypertension (HCC)   Occipital stroke (HCC)   Acute liver failure   Acute hypoxemic respiratory failure (HCC)   Upper GI bleed   Palliative care by specialist   Protein-calorie malnutrition, severe   Pressure injury of skin   Acute respiratory failure with hypoxia (HCC)   NSTEMI (non-ST elevated myocardial infarction) ([place])   Ischemic cardiomyopathy   Atrial fibrillation with RVR (HCC)   Transaminitis   HLD (hyperlipidemia)   PVD (peripheral vascular disease)  (HCC)   Stage I decubitus ulcer and pressure area   Hemorrhagic shock:  -Secondary to upper GI bleed. She was on Xarelto at home. Reversed with Kcentra, platelets transfusion, status post 4 units of PRBC. GI was consulted. EGD deferred due to age/comorbidities.  -Protonix 40 mg bid Recent Labs  Lab 09/21/19 0537 09/22/19 0353 09/23/19 0434 09/24/19 0355 09/25/19 0544  HGB 12.5 13.3 13.2 12.7 13.2  -Stable  Acute respiratory failure with hypoxia/Aspiration pneumonia -On presentation, she was intubated for airway protection.  -4/11 extubated  -Completed round of antibiotics for aspiration pneumonia  NSTEMI/Ischemic Cardiomyopathy/Acute on Chronic systolic CHF   Troponin was elevated on presentation. Echocardiogram showed EF 35 to 40%, regional wall motion abnormalities, RV systolic function severely reduced, severely dilated left atria, right atria moderate MR mild to moderate TR. Cardiology was consulted, no intervention planned. Continue lasix   A. fib with RVR:  -Currently NSR  -Not a candidate for anticoagulation    AKI: Resolved  Transaminitis: Most likely secondary to shock liver on presentation versus liver congestion due to CHF. LFTs trended down. Hepatitis panel negative.  Dysphagia:Dysphagia diet. Speech therapy following. She was treated with Unasyn for aspiration pneumonia.  Hyperlipidemia: Continue Lipitor  Acute metabolic encephalopathy: Multifactorial cause. Continue supportive care. History of CVA . History of dementia diagnosed about 6 to 8 months ago.  Stable   Peripheral vascular disease/chronic left foot wound: S/p Percutaneous transluminal angioplasty and stent placementleftsuperficial femoral artery and popliteal on 3/17. Daily application of small foam dressing for left foot wound  Stage I medial buttocks wound Pressure Injury 09/20/19 Buttocks Medial Stage 1 -  Intact skin with non-blanchable redness of a localized area usually  over a bony prominence. (Active)  09/20/19 0028  Location: Buttocks  Location Orientation: Medial  Staging: Stage 1 -  Intact skin with non-blanchable redness of a localized area usually over a bony prominence.  Wound Description (Comments):   Present on Admission:    Hypomagnesmia -Magnesium goal> 2 -Magnesium IV 3 g    Constipation: Lactulose PRN  Goals of care: DNR/DNI. Palliative care was consulted. Recommend outpatient follow-up with palliative care.     DVT prophylaxis: SCD Code Status: DNR Family Communication:  Disposition Plan:  1.  Where the patient is from 2.  Anticipated d/c place. 3.  Barriers to d/c Per EMR APS has been contacted, and abandonment papers have been filled out.  Awaiting guardian ad litem to be appointed in order for patient to be placed to SNF   Consultants:  Cardiology GI PCCM Palliative care   Procedures/Significant Events:    I have personally reviewed and interpreted all radiology studies and my findings are as above.  VENTILATOR SETTINGS:    Cultures   Antimicrobials: Anti-infectives (From admission, onward)   Start     Dose/Rate Stop   09/09/19 1800  amoxicillin-clavulanate (AUGMENTIN) 500-125 MG per tablet 500 mg     500 mg 09/12/19 2003   09/06/19 1430  Ampicillin-Sulbactam (UNASYN) 3 g in sodium chloride 0.9 % 100 mL IVPB  Status:  Discontinued     3 g 200 mL/hr over 30 Minutes 09/09/19 1353   09/02/19 2030  ceFEPIme (MAXIPIME) 2 g in sodium chloride 0.9 % 100 mL IVPB  Status:  Discontinued     2 g 200 mL/hr over 30 Minutes 09/03/19 0900   09/01/19 2043  vancomycin variable dose per unstable renal function (pharmacist dosing)  Status:  Discontinued      09/03/19 0900   09/01/19 1945  ceFEPIme (MAXIPIME) 2 g in sodium chloride 0.9 % 100 mL IVPB     2 g 200 mL/hr over 30 Minutes 09/01/19 2051   09/01/19 1945  metroNIDAZOLE (FLAGYL) IVPB 500 mg     500 mg 100 mL/hr over 60 Minutes 09/02/19 0129   09/01/19 1945   vancomycin (VANCOCIN) IVPB 1000 mg/200 mL premix     1,000 mg 200 mL/hr over 60 Minutes 09/02/19 0129       Devices    LINES / TUBES:      Continuous Infusions: . sodium chloride 10 mL/hr at 09/08/19 2300     Objective: Vitals:   09/24/19 1148 09/24/19 1957 09/25/19 0119 09/25/19 0421  BP: (!) 118/54 140/74  127/62  Pulse: 82 84  62  Resp: 18 (!) 22  (!) 22  Temp: 98.3 F (36.8 C) (!) 97.5 F (36.4 C)  (!) 97.4 F (36.3 C)  TempSrc: Oral Oral  Oral  SpO2: 98% 97%  93%  Weight:   54 kg   Height:        Intake/Output Summary (Last 24 hours) at 09/25/2019 0916 Last data filed at 09/25/2019 0424 Gross per 24 hour  Intake 720 ml  Output 1050 ml  Net -330 ml   Filed Weights   09/23/19 0254 09/24/19 0104 09/25/19 0119  Weight: 53.2 kg 55.1 kg 54 kg   Physical Exam:  General: A/O x1 (does not know where, when, why), pleasant, follows commands, no acute respiratory distress Eyes: negative scleral hemorrhage, negative anisocoria, negative icterus ENT: Negative Runny nose, negative gingival bleeding, Neck:  Negative scars, masses, torticollis, lymphadenopathy,  JVD Lungs: Clear to auscultation bilaterally without wheezes or crackles Cardiovascular: Regular rate and rhythm without murmur gallop or rub normal S1 and S2 Abdomen: negative abdominal pain, nondistended, positive soft, bowel sounds, no rebound, no ascites, no appreciable mass Extremities: No significant cyanosis, clubbing, or edema bilateral lower extremities Skin: Negative rashes, lesions, ulcers Psychiatric: Unable to assess secondary to altered mental status Central nervous system:  Cranial nerves II through XII intact, tongue/uvula midline, all extremities muscle strength 5/5, sensation intact throughout, negative dysarthria, negative expressive aphasia, negative receptive aphasia.  .     Data Reviewed: Care during the described time interval was provided by me .  I have reviewed this patient's available  data, including medical history, events of note, physical examination, and all test results as part of my evaluation.  CBC: Recent Labs  Lab 09/21/19 0537 09/22/19 0353 09/23/19 0434 09/24/19 0355 09/25/19 0544  WBC 6.6 7.4 8.9 8.2 7.6  HGB 12.5 13.3 13.2 12.7 13.2  HCT 39.4 42.0 41.2 39.8 40.8  MCV 90.4 90.9 89.6 91.1 90.7  PLT 268 262 278 257 224   Basic Metabolic Panel: Recent Labs  Lab 09/21/19 0537 09/22/19 0353 09/23/19 0434 09/24/19 0355 09/25/19 0544  NA 136 136 134* 136 137  K 4.2 4.2 4.3 5.1 3.9  CL 100 97* 97* 99 99  CO2 28 28 26 28 29   GLUCOSE 95 107* 96 107* 96  BUN 24* 25* 25* 35* 26*  CREATININE 0.80 1.06* 0.92 0.99 0.88  CALCIUM 8.7* 8.7* 8.7* 8.7* 8.8*  MG  --  1.7 1.8 1.9 1.7  PHOS  --  3.2 3.4 4.0 3.3   GFR: Estimated Creatinine Clearance: 38.4 mL/min (by C-G formula based on SCr of 0.88 mg/dL). Liver Function Tests: No results for input(s): AST, ALT, ALKPHOS, BILITOT, PROT, ALBUMIN in the last 168 hours. No results for input(s): LIPASE, AMYLASE in the last 168 hours. No results for input(s): AMMONIA in the last 168 hours. Coagulation Profile: No results for input(s): INR, PROTIME in the last 168 hours. Cardiac Enzymes: No results for input(s): CKTOTAL, CKMB, CKMBINDEX, TROPONINI in the last 168 hours. BNP (last 3 results) No results for input(s): PROBNP in the last 8760 hours. HbA1C: No results for input(s): HGBA1C in the last 72 hours. CBG: No results for input(s): GLUCAP in the last 168 hours. Lipid Profile: No results for input(s): CHOL, HDL, LDLCALC, TRIG, CHOLHDL, LDLDIRECT in the last 72 hours. Thyroid Function Tests: No results for input(s): TSH, T4TOTAL, FREET4, T3FREE, THYROIDAB in the last 72 hours. Anemia Panel: No results for input(s): VITAMINB12, FOLATE, FERRITIN, TIBC, IRON, RETICCTPCT in the last 72 hours. Sepsis Labs: No results for input(s): PROCALCITON, LATICACIDVEN in the last 168 hours.  No results found for this or  any previous visit (from the past 240 hour(s)).       Radiology Studies: No results found.      Scheduled Meds: . atorvastatin  20 mg Oral q1800  . chlorhexidine  15 mL Mouth Rinse BID  . feeding supplement (ENSURE ENLIVE)  237 mL Oral BID BM  . folic acid  1 mg Oral Daily  . furosemide  40 mg Oral Daily  . mouth rinse  15 mL Mouth Rinse q12n4p  . metoprolol tartrate  50 mg Oral BID  . multivitamin with minerals  1 tablet Oral Daily  . pantoprazole  40 mg Oral BID  . senna-docusate  1 tablet Oral BID  . tamsulosin  0.4 mg Oral Daily   Continuous  Infusions: . sodium chloride 10 mL/hr at 09/08/19 2300     LOS: 24 days    Time spent:40 min    Quatavious Rossa, Geraldo Docker, MD [facility] Pager [phone]  If 7PM-7AM, please contact night-coverage [email or web address] Password [facility] 09/25/2019, 9:16 AM

## 2019-09-26 LAB — CBC
HCT: 38.8 % (ref 36.0–46.0)
Hemoglobin: 12.5 g/dL (ref 12.0–15.0)
MCH: 29.3 pg (ref 26.0–34.0)
MCHC: 32.2 g/dL (ref 30.0–36.0)
MCV: 91.1 fL (ref 80.0–100.0)
Platelets: 240 10*3/uL (ref 150–400)
RBC: 4.26 MIL/uL (ref 3.87–5.11)
RDW: 17.3 % — ABNORMAL HIGH (ref 11.5–15.5)
WBC: 8.5 10*3/uL (ref 4.0–10.5)
nRBC: 0 % (ref 0.0–0.2)

## 2019-09-26 LAB — BASIC METABOLIC PANEL
Anion gap: 10 (ref 5–15)
BUN: 22 mg/dL (ref 8–23)
CO2: 29 mmol/L (ref 22–32)
Calcium: 8.7 mg/dL — ABNORMAL LOW (ref 8.9–10.3)
Chloride: 99 mmol/L (ref 98–111)
Creatinine, Ser: 0.86 mg/dL (ref 0.44–1.00)
GFR calc Af Amer: >60 mL/min (ref >60)
GFR calc non Af Amer: >60 mL/min (ref >60)
Glucose, Bld: 93 mg/dL (ref 70–99)
Potassium: 3.7 mmol/L (ref 3.5–5.1)
Sodium: 138 mmol/L (ref 135–145)

## 2019-09-26 LAB — MAGNESIUM: Magnesium: 2.1 mg/dL (ref 1.7–2.4)

## 2019-09-26 LAB — PHOSPHORUS: Phosphorus: 3.5 mg/dL (ref 2.5–4.6)
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
## 2019-09-26 NOTE — Progress Notes (Signed)
Physical Therapy Treatment Patient Details Name: Tiffany Charles MRN: [id number] DOB: [birth year] Today's Date: 09/26/2019    History of Present Illness 86 year old female with PMH as below, which is significant for Atrial fibrillation on Xarelto, COPD, CAD, and HTN. She was recently admitted to [facility] and treated for cellulitis with keflex and doxycycline. She presented to [facility] ED 4/8 hypoxemic and hypotensive, after being found altered at SNF with coffee-ground emesis beside her bed. Intubated 4/8-4/11.    PT Comments    Pt admitted with above diagnosis. Pt was able to ambulate with incr distance with RW with min assist for safety.  Still needs cues and assist to steer RW.  Pt currently with functional limitations due to balance and endurance deficits. Pt will benefit from skilled PT to increase their independence and safety with mobility to allow discharge to the venue listed below.     Follow Up Recommendations  Supervision/Assistance - 24 hour;SNF     Equipment Recommendations  Other (comment)(defer to post acute)    Recommendations for Other Services       Precautions / Restrictions Precautions Precautions: Fall Restrictions Weight Bearing Restrictions: No    Mobility  Bed Mobility Overal bed mobility: Needs Assistance Bed Mobility: Sit to Supine;Supine to Sit     Supine to sit: Min assist Sit to supine: Min guard   General bed mobility comments: pt returned self to supine with MIN guard for safety  Transfers Overall transfer level: Needs assistance Equipment used: Rolling walker (2 wheeled) Transfers: Sit to/from Stand Sit to Stand: Min assist         General transfer comment: light MIN A to power into standing  Ambulation/Gait Ambulation/Gait assistance: Min guard;Min assist Gait Distance (Feet): 450 Feet Assistive device: Rolling walker (2 wheeled) Gait Pattern/deviations: Step-to pattern;Decreased stride length;Drifts right/left;Trunk flexed;Wide base  of support Gait velocity: slower Gait velocity interpretation: <1.31 ft/sec, indicative of household ambulator General Gait Details:  Walked to bathroom first.  Needed assist to clean pt.  Pt close assist/guard for safety, max encouragment to encourage/direct   Stairs             Wheelchair Mobility    Modified Rankin (Stroke Patients Only)       Balance Overall balance assessment: Needs assistance Sitting-balance support: Feet unsupported;No upper extremity supported Sitting balance-Leahy Scale: Fair     Standing balance support: During functional activity;Bilateral upper extremity supported Standing balance-Leahy Scale: Poor Standing balance comment: close min guard for dynamic tasks at sink                            Cognition Arousal/Alertness: Awake/alert Behavior During Therapy: Flat affect;WFL for tasks assessed/performed Overall Cognitive Status: Impaired/Different from baseline Area of Impairment: Orientation;Attention;Memory;Following commands;Safety/judgement;Awareness;Problem solving                 Orientation Level: Disoriented to;Place;Time;Situation Current Attention Level: Focused Memory: Decreased short-term memory Following Commands: Follows one step commands inconsistently;Follows one step commands with increased time Safety/Judgement: Decreased awareness of safety;Decreased awareness of deficits Awareness: Intellectual Problem Solving: Decreased initiation;Requires verbal cues;Slow processing;Difficulty sequencing;Requires tactile cues General Comments: pt responds well to short, direct verbal cues as well as tactile cues.      Exercises Other Exercises Other Exercises: warm up ROM exercise of UE and LE's prior to mobility    General Comments General comments (skin integrity, edema, etc.): Pts son present - he askeed to talk to doctor about  pts diet. Called nurse to let her know.       Pertinent Vitals/Pain Pain  Assessment: No/denies pain Faces Pain Scale: No hurt    Home Living                      Prior Function            PT Goals (current goals can now be found in the care plan section) Acute Rehab PT Goals Patient Stated Goal: pt unable to state  goals Progress towards PT goals: Progressing toward goals    Frequency    Min 2X/week      PT Plan Current plan remains appropriate    Co-evaluation              AM-PAC PT "6 Clicks" Mobility   Outcome Measure  Help needed turning from your back to your side while in a flat bed without using bedrails?: A Little Help needed moving from lying on your back to sitting on the side of a flat bed without using bedrails?: A Little Help needed moving to and from a bed to a chair (including a wheelchair)?: A Little Help needed standing up from a chair using your arms (e.g., wheelchair or bedside chair)?: A Little Help needed to walk in hospital room?: A Little Help needed climbing 3-5 steps with a railing? : A Little 6 Click Score: 18    End of Session Equipment Utilized During Treatment: Gait belt Activity Tolerance: Patient tolerated treatment well;Patient limited by fatigue Patient left: with call bell/phone within reach;in bed;with bed alarm set;with family/visitor present Nurse Communication: Mobility status PT Visit Diagnosis: Unsteadiness on feet (R26.81);Muscle weakness (generalized) (M62.81)     Time: [id number] PT Time Calculation (min) (ACUTE ONLY): 21 min  Charges:  $Gait Training: 8-22 mins                     Juventino Pavone W,PT [place] Pager:  [phone]  Office:  [place] 09/26/2019, 3:41 PM
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
## 2019-09-26 NOTE — Progress Notes (Signed)
Occupational Therapy Treatment Patient Details Name: Tiffany Charles MRN: [id number] DOB: 1929-10-15 Today's Date: 09/26/2019    History of present illness 86 year old female with PMH as below, which is significant for Atrial fibrillation on Xarelto, COPD, CAD, and HTN. She was recently admitted to [facility] and treated for cellulitis with keflex and doxycycline. She presented to [facility] ED 4/8 hypoxemic and hypotensive, after being found altered at SNF with coffee-ground emesis beside her bed. Intubated 4/8-4/11.   OT comments  Pt progressing OOB ADL/mobility. Pt following most commands if direct and short; requires multimodal cueing due to selective attention in hallway. Pt ambulating 150' with minguardA and use of RW. Pt easily distracted. Pt requires 24/7 for directional cues and to sequence through tasks. Pt would benefit from continued OT skilled services for ADL and mobility. OT following acutely.    Follow Up Recommendations  Home health OT;Supervision/Assistance - 24 hour    Equipment Recommendations  3 in 1 bedside commode    Recommendations for Other Services      Precautions / Restrictions Precautions Precautions: Fall Restrictions Weight Bearing Restrictions: No       Mobility Bed Mobility Overal bed mobility: Needs Assistance Bed Mobility: Sit to Supine;Supine to Sit     Supine to sit: Min assist Sit to supine: Min guard   General bed mobility comments: minguardA for fluent movement  Transfers Overall transfer level: Needs assistance Equipment used: Rolling walker (2 wheeled) Transfers: Sit to/from Stand Sit to Stand: Min assist         General transfer comment: minA for power-up    Balance Overall balance assessment: Needs assistance Sitting-balance support: Feet unsupported;No upper extremity supported Sitting balance-Leahy Scale: Fair     Standing balance support: During functional activity;Bilateral upper extremity supported Standing  balance-Leahy Scale: Poor Standing balance comment: close min guard for dynamic tasks at sink                           ADL either performed or assessed with clinical judgement   ADL Overall ADL's : Needs assistance/impaired Eating/Feeding: Modified independent;Sitting   Grooming: Set up;Bed level Grooming Details (indicate cue type and reason): washing face, combing hair                             Functional mobility during ADLs: Min guard;Rolling walker;Cueing for safety General ADL Comments: Pt easily distracted. Pt requires 24/7 for directional cues and to sequence through tasks,     Vision   Vision Assessment?: Yes Eye Alignment: Within Functional Limits Ocular Range of Motion: Within Functional Limits Additional Comments: Pt having difficulty reading labels and signs right outside of room   Perception     Praxis      Cognition Arousal/Alertness: Awake/alert Behavior During Therapy: Flat affect;WFL for tasks assessed/performed Overall Cognitive Status: Impaired/Different from baseline Area of Impairment: Orientation;Attention;Memory;Following commands;Safety/judgement;Awareness;Problem solving                 Orientation Level: Disoriented to;Place;Time;Situation Current Attention Level: Focused Memory: Decreased short-term memory Following Commands: Follows one step commands inconsistently;Follows one step commands with increased time Safety/Judgement: Decreased awareness of safety;Decreased awareness of deficits Awareness: Intellectual Problem Solving: Decreased initiation;Requires verbal cues;Slow processing;Difficulty sequencing;Requires tactile cues General Comments: Pt following most commands if direct and short; requires multimodal cueing due to selective attention in hallway. Pt easily distracted.        Exercises  Exercises: Other exercises Other Exercises Other Exercises: warm up ROM exercise of UE and LE's prior to  mobility   Shoulder Instructions       General Comments [name] in room. Pt ambulating 150' with minguardA and use of RW.    Pertinent Vitals/ Pain       Pain Assessment: No/denies pain Faces Pain Scale: No hurt  Home Living                                          Prior Functioning/Environment              Frequency  Min 2X/week        Progress Toward Goals  OT Goals(current goals can now be found in the care plan section)  Progress towards OT goals: Progressing toward goals  Acute Rehab OT Goals Patient Stated Goal: pt unable to state  goals OT Goal Formulation: Patient unable to participate in goal setting Time For Goal Achievement: 10/04/19 Potential to Achieve Goals: Fair ADL Goals Pt Will Perform Eating: with min assist;sitting Pt Will Perform Grooming: with min guard assist;standing Pt Will Perform Upper Body Dressing: with set-up;sitting Pt Will Transfer to Toilet: with min guard assist;ambulating;regular height toilet;grab bars Additional ADL Goal #1: Pt will follow 2 step command to complete grooming task with minguardA while seated/standing at sink.  Plan Discharge plan needs to be updated    Co-evaluation                 AM-PAC OT "6 Clicks" Daily Activity     Outcome Measure   Help from another person eating meals?: A Little Help from another person taking care of personal grooming?: A Little Help from another person toileting, which includes using toliet, bedpan, or urinal?: A Lot Help from another person bathing (including washing, rinsing, drying)?: A Lot Help from another person to put on and taking off regular upper body clothing?: A Little Help from another person to put on and taking off regular lower body clothing?: A Little 6 Click Score: 16    End of Session Equipment Utilized During Treatment: Gait belt;Rolling walker  OT Visit Diagnosis: Unsteadiness on feet (R26.81);Muscle weakness (generalized)  (M62.81);Other symptoms and signs involving cognitive function   Activity Tolerance Patient tolerated treatment well   Patient Left in bed;with call bell/phone within reach;with bed alarm set;with family/visitor present   Nurse Communication Mobility status        Time: 1233-1300 OT Time Calculation (min): 27 min  Charges: OT General Charges $OT Visit: 1 Visit OT Treatments $Self Care/Home Management : 8-22 mins $Therapeutic Activity: 8-22 mins  Jefferey Pica, OTR/L Acute Rehabilitation Services Pager: [phone] Office: [phone]    Melondy Blanchard C 09/26/2019, 5:05 PM

## 2019-09-27 DIAGNOSIS — I272 Pulmonary hypertension, unspecified: Secondary | ICD-10-CM

## 2019-09-27 MED ORDER — FUROSEMIDE 40 MG PO TABS: 40.0000 mg | ORAL_TABLET | Freq: Every day | ORAL | 0 refills | Status: DC

## 2019-09-27 MED ORDER — ATORVASTATIN CALCIUM 20 MG PO TABS: 20.0000 mg | ORAL_TABLET | Freq: Every day | ORAL | 0 refills | Status: AC

## 2019-09-27 MED ORDER — TAMSULOSIN HCL 0.4 MG PO CAPS: 0.4000 mg | ORAL_CAPSULE | Freq: Every day | ORAL | 0 refills | Status: AC

## 2019-09-27 MED ORDER — LACTULOSE 10 GM/15ML PO SOLN: 10.0000 g | Freq: Two times a day (BID) | ORAL | 0 refills | Status: AC | PRN

## 2019-09-27 MED ORDER — METOPROLOL TARTRATE 50 MG PO TABS: 50.0000 mg | ORAL_TABLET | Freq: Two times a day (BID) | ORAL | 0 refills | Status: AC

## 2019-09-27 MED ORDER — FOLIC ACID 1 MG PO TABS: 1.0000 mg | ORAL_TABLET | Freq: Every day | ORAL | 0 refills | Status: AC

## 2019-09-27 MED ORDER — POLYETHYLENE GLYCOL 3350 17 G PO PACK: 17.0000 g | PACK | Freq: Every day | ORAL | 0 refills | Status: AC | PRN

## 2019-09-27 MED ORDER — PANTOPRAZOLE SODIUM 40 MG PO TBEC: 40.0000 mg | DELAYED_RELEASE_TABLET | Freq: Two times a day (BID) | ORAL | 0 refills | Status: AC

## 2019-09-27 MED ORDER — ACETAMINOPHEN 325 MG PO TABS: 650.0000 mg | ORAL_TABLET | ORAL | 0 refills | Status: AC | PRN

## 2019-09-27 MED ORDER — LORAZEPAM 1 MG PO TABS: 1.0000 mg | ORAL_TABLET | Freq: Four times a day (QID) | ORAL | 0 refills | Status: AC | PRN

## 2019-09-27 MED FILL — ATORVASTATIN CALCIUM 20 MG: 20 | 30 days supply | Qty: 30 | Fill #0

## 2019-09-27 MED FILL — TAMSULOSIN HCL 0.4 MG CAP: 0.4 | 30 days supply | Qty: 30 | Fill #0

## 2019-09-27 MED FILL — FOLIC ACID 1 MG TABS: 1 | 30 days supply | Qty: 30 | Fill #0

## 2019-09-27 MED FILL — LACTULOSE 10 GM/15 ML SOLN: 10 | 8 days supply | Qty: 236 | Fill #0

## 2019-09-27 MED FILL — METOPROLOL TARTRATE 50 MG T: 50 | 30 days supply | Qty: 60 | Fill #0

## 2019-09-27 MED FILL — PANTOPRAZOLE SOD DR 40 MG T: 40 | 30 days supply | Qty: 60 | Fill #0

## 2019-09-27 MED FILL — ACETAMINOPHEN 325 MG TABS: 325 | 5 days supply | Qty: 30 | Fill #0

## 2019-09-27 MED FILL — FUROSEMIDE 40 MG TABLET: 40 | 30 days supply | Qty: 30 | Fill #0

## 2019-09-27 MED FILL — POLYETHYLENE GLYCOL 3350 PO: 17 | 14 days supply | Qty: 238 | Fill #0

## 2019-09-27 NOTE — TOC Transition Note (Signed)
Transition of Care [facility]) - CM/SW Discharge Note   Patient Details  Name: Tiffany Charles MRN: [id number] Date of Birth: 22-Aug-1929  [facility] CM/SW Contact:  Alberteen Sam, LCSW Phone Number: 09/27/2019, 8:52 AM   Clinical Narrative:     Patient to discharge home today with son Virgina Organ, [place])  who will arrive at 11:00 am to pick up, CSW informed Tiffany at [facility] of patient's discharge today as she is set up with their [facility] branch with PT, OT, aide and and social work. Home Health orders have been requested of MD.  No other discharge needs identified at this time, CSW signing off.   Final next level of care: Home w Home Health Services Barriers to Discharge: No Barriers Identified   Patient Goals and CMS Choice   CMS Medicare.gov Compare Post Acute Care list provided to:: Patient Represenative (must comment)(POA [name]) Choice offered to / list presented to : [facility] / [place]  Discharge Placement                  Name of family member notified: Judson/Steve Patient and family notified of of transfer: 09/27/19  Discharge Plan and Services                          HH Arranged: PT, OT, Nurse's Aide, Social Work [employer] Agency: [facility] [employer] [facility] [employer] Date [place]: 09/27/19 Time [place]: [phone] Representative spoke with at [place]: [facility]  Social Determinants of Health ([place]) Interventions     Readmission Risk Interventions No flowsheet data found.

## 2019-09-27 NOTE — Progress Notes (Signed)
Nutrition Follow-up  DOCUMENTATION CODES:   Severe malnutrition in context of chronic illness  INTERVENTION:   -ContinueEnsure Enlive po BID, each supplement provides 350 kcal and 20 grams of protein -ContinueMagic cup TID with meals, each supplement provides 290 kcal and 9 grams of protein -Continue MVI with minerals daily   NUTRITION DIAGNOSIS:   Severe Malnutrition related to chronic illness(COPD) as evidenced by moderate fat depletion, severe fat depletion, moderate muscle depletion, severe muscle depletion.  Ongoing  GOAL:   Patient will meet greater than or equal to 90% of their needs  Progressing   MONITOR:   PO intake, Supplement acceptance, Weight trends, Skin, Labs, I & O's, Diet advancement  REASON FOR ASSESSMENT:   Ventilator    ASSESSMENT:   86 yo female admitted with hemorrhagic shock due to GI hemorrhage. Required intubation on admission. PMH includes A fib, COPD, CAD, HTN.  4/11- extubated 4/12- s/p BSE- recommend continue NPO 4/13- s/p BSE- advanced to dysphagia 1 diet with nectar thick liquids 4/23- advanced to dysphagia 2 diet with thin liquids  Reviewed I/O's: -850 ml x 24 hours and -621 ml since 09/13/19  UOP: 1.5 L x 24 hours  Pt continues to tolerate current diet texture well. She remains with good appetite, consuming 50-100% of meals. She is consuming her Ensure supplements.   Per [facility] team notes, plan to discharge to pt's son home in [place], [place] today.   Labs reviewed.   Diet Order:   Diet Order            DIET DYS 2 Room service appropriate? Yes with Assist; Fluid consistency: Thin  Diet effective now              EDUCATION NEEDS:   No education needs have been identified at this time  Skin:  Skin Assessment: Skin Integrity Issues: Skin Integrity Issues:: Other (Comment) Other: non pressure wound to lt foot with swelling, skin tear to lt arm  Last BM:  09/26/19  Height:   Ht Readings from Last 1 Encounters:  09/01/19  5\' 6"  (1.676 m)    Weight:   Wt Readings from Last 1 Encounters:  09/27/19 50.3 kg    Ideal Body Weight:  59.1 kg  BMI:  Body mass index is 17.92 kg/m.  Estimated Nutritional Needs:   Kcal:  1500-1700  Protein:  75-90 grams  Fluid:  > 1.5 L    Loistine Chance, RD, LDN, [place] Registered Dietitian II Certified Diabetes Care and Education Specialist Please refer to [facility] for RD and/or RD on-call/weekend/after hours pager

## 2019-09-27 NOTE — Progress Notes (Signed)
Spoke with Lisabeth Register (POA) to follow up on discharge for his grandmother today. His father, [name] should arrive to the hospital prior to 1200. Will follow up with [name] if discharge is delayed.

## 2019-09-27 NOTE — Progress Notes (Signed)
Son, [name], at bedside to discharge patient. Grandson on the phone with concerns about dentures. No documentation of patient belongings in [place]. Patient originally intubated in the ER upon arrival from nursing facility and then transferred to to [facility]. Both departments contacted about patient belongings and both departments note that they do not have anything. Will follow up with Grandson to let him know to reach out to nursing facility.

## 2019-09-27 NOTE — Discharge Summary (Signed)
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 stenosis. Inflow: Atherosclerotic changes are noted of the bilateral common iliac arteries resulting in mild-to-moderate narrowing bilaterally. Proximal Outflow: There are atherosclerotic changes throughout the right CFA and SFA resulting in mild-to-moderate narrowing. There is a patent SFA stent on the left. Veins: No obvious venous abnormality within the limitations of this arterial phase study. Portal vein is patent. The splenic vein is patent. Review of the MIP images confirms the above findings. NON-VASCULAR Lower chest: There are small bilateral pleural effusions. There is evidence for vascular congestion.The heart size is significantly enlarged. There is reflux of contrast into the IVC. Hepatobiliary: There are coarse calcifications within the right hepatic lobe. There is periportal edema. There is diffuse gallbladder wall thickening.There is no biliary ductal dilation. Pancreas: Peripancreatic fat stranding and free fluid is noted. The pancreas  appears somewhat edematous. Spleen: Unremarkable. Adrenals/Urinary Tract: --Adrenal glands: Unremarkable. --Right kidney/ureter: No hydronephrosis or radiopaque kidney stones. --Left kidney/ureter: No hydronephrosis or radiopaque kidney stones. --Urinary bladder: The bladder is decompressed with a Foley catheter and therefore is poorly evaluated. Stomach/Bowel: --Stomach/Duodenum: In NG tube courses into the stomach with the tip terminating near the gastric fundus. There is no evidence for active extravasation into the gastric lumen. --Small bowel: Unremarkable. --Colon: There is a large amount of stool at the level of the patient's rectum. There is a moderate amount of stool throughout the remaining portions of the colon. There is a moderate amount of stool at the level of the cecum. --Appendix: Normal. Lymphatic: --No retroperitoneal lymphadenopathy. --No mesenteric lymphadenopathy. --No pelvic or inguinal lymphadenopathy. Reproductive: Status post hysterectomy. No adnexal mass. Other: There is a small volume of ascites in the abdomen and pelvis. Mild diffuse body wall edema is noted. Musculoskeletal. No acute displaced fractures. IMPRESSION: 1. No evidence for active GI bleeding on this study. 2. Significantly attenuated mesenteric vasculature which is felt to be secondary to hypotension and vasopressor usage. 3. Peripancreatic fat stranding and free fluid raises concern for underlying interstitial pancreatitis. Correlation with lipase is recommended. There is no evidence for pancreatic necrosis. 4. Periportal edema which is nonspecific and can be seen in patients with hepatitis or may be secondary to aggressive volume resuscitation. 5. Diffuse gallbladder wall thickening which may be secondary to the presence of ascites, heart failure, or acute hepatitis. If there is clinical concern for acute cholecystitis, follow-up with ultrasound is recommended. 6. Probable developing anasarca and small bilateral pleural  effusions. 7. Cardiomegaly with reflux of contrast into the IVC consistent with underlying cardiac dysfunction. 8. Large amount of stool at the level of the patient's rectum. There is a moderate amount of stool throughout the remaining portions of the colon. Aortic Atherosclerosis (ICD10-I70.0). Electronically Signed   By: CConstance HolsterM.D.   On: 09/01/2019 23:49    Microbiology: No results found for this or any previous visit (from the past 240 hour(s)).   Labs: Basic Metabolic Panel: Recent Labs  Lab 09/22/19 0353 09/23/19 0434 09/24/19 0355 09/25/19 0544 09/26/19 0549  NA 136 134* 136 137 138  K 4.2 4.3 5.1 3.9 3.7  CL 97* 97* 99 99 99  CO2 28 26 28 29 29   GLUCOSE 107* 96 107* 96 93  BUN 25* 25* 35* 26* 22  CREATININE 1.06* 0.92 0.99 0.88 0.86  CALCIUM 8.7* 8.7* 8.7* 8.8* 8.7*  MG 1.7 1.8 1.9 1.7 2.1  PHOS 3.2 3.4 4.0 3.3 3.5   Liver Function Tests: No results for input(s): AST, ALT, ALKPHOS, BILITOT, PROT, ALBUMIN in the last 168 hours. No results for input(s): LIPASE, AMYLASE in the last 168 hours. No results for input(s): AMMONIA in the last 168 hours. CBC: Recent Labs  Lab 09/22/19 0353 09/23/19 0434 09/24/19 0355 09/25/19 0544 09/26/19 0549  WBC 7.4 8.9 8.2 7.6 8.5  HGB 13.3 13.2 12.7 13.2 12.5  HCT 42.0 41.2 39.8 40.8 38.8  MCV 90.9 89.6 91.1 90.7 91.1  PLT 262 278 257 243 240   Cardiac Enzymes: No results for input(s): CKTOTAL, CKMB, CKMBINDEX, TROPONINI in the last 168 hours. BNP: BNP (last 3 results) Recent Labs    06/05/19 1352  BNP 194.0*    ProBNP (last 3 results) No results for input(s): PROBNP in the last 8760 hours.  CBG: No results for input(s): GLUCAP in the last 168 hours.     Signed:  Dia Crawford, MD [facility] [phone] pager

## 2019-09-27 NOTE — Care Management Important Message (Signed)
Important Message  Patient Details  Name: Tiffany Charles MRN: [id number] Date of Birth: [birth year]   Medicare Important Message Given:  Yes     [name] 09/27/2019, 10:10 AM

## 2019-09-27 NOTE — Plan of Care (Signed)
  Problem: Education: Goal: Knowledge of General Education information will improve Description: Including pain rating scale, medication(s)/side effects and non-pharmacologic comfort measures Outcome: Adequate for Discharge   Problem: Health Behavior/Discharge Planning: Goal: Ability to manage health-related needs will improve Outcome: Adequate for Discharge   Problem: Clinical Measurements: Goal: Ability to maintain clinical measurements within normal limits will improve Outcome: Adequate for Discharge Goal: Will remain free from infection Outcome: Adequate for Discharge Goal: Diagnostic test results will improve Outcome: Adequate for Discharge Goal: Respiratory complications will improve Outcome: Adequate for Discharge Goal: Cardiovascular complication will be avoided Outcome: Adequate for Discharge   Problem: Activity: Goal: Risk for activity intolerance will decrease Outcome: Adequate for Discharge   Problem: Nutrition: Goal: Adequate nutrition will be maintained Outcome: Adequate for Discharge   Problem: Coping: Goal: Level of anxiety will decrease Outcome: Adequate for Discharge   Problem: Elimination: Goal: Will not experience complications related to bowel motility Outcome: Adequate for Discharge Goal: Will not experience complications related to urinary retention Outcome: Adequate for Discharge   Problem: Pain Managment: Goal: General experience of comfort will improve Outcome: Adequate for Discharge   Problem: Safety: Goal: Ability to remain free from injury will improve Outcome: Adequate for Discharge   Problem: Skin Integrity: Goal: Risk for impaired skin integrity will decrease Outcome: Adequate for Discharge   Problem: Activity: Goal: Ability to tolerate increased activity will improve Outcome: Adequate for Discharge   Problem: Respiratory: Goal: Ability to maintain a clear airway and adequate ventilation will improve Outcome: Adequate for  Discharge   Problem: Role Relationship: Goal: Method of communication will improve Outcome: Adequate for Discharge   Problem: Education: Goal: Knowledge of disease or condition will improve Outcome: Adequate for Discharge Goal: Understanding of medication regimen will improve Outcome: Adequate for Discharge Goal: Individualized Educational Video(s) Outcome: Adequate for Discharge   Problem: Activity: Goal: Ability to tolerate increased activity will improve Outcome: Adequate for Discharge   Problem: Cardiac: Goal: Ability to achieve and maintain adequate cardiopulmonary perfusion will improve Outcome: Adequate for Discharge   Problem: Health Behavior/Discharge Planning: Goal: Ability to safely manage health-related needs after discharge will improve Outcome: Adequate for Discharge   Problem: Education: Goal: Ability to demonstrate management of disease process will improve Outcome: Adequate for Discharge Goal: Ability to verbalize understanding of medication therapies will improve Outcome: Adequate for Discharge Goal: Individualized Educational Video(s) Outcome: Adequate for Discharge   Problem: Activity: Goal: Capacity to carry out activities will improve Outcome: Adequate for Discharge   Problem: Cardiac: Goal: Ability to achieve and maintain adequate cardiopulmonary perfusion will improve Outcome: Adequate for Discharge

## 2019-09-28 ENCOUNTER — Other Ambulatory Visit: Payer: Self-pay

## 2019-09-28 NOTE — Patient Outreach (Signed)
[facility]) Care Management  09/28/2019  KIMBRELY TORI Aug 31, 1929 [id number]   Referral Date: 09/28/19 Referral Source: [employer] Report Date of Discharge:09/27/19 Facility: [place]: [facility]   Referral received.  No outreach warranted at this time.  Transition of Care calls being completed via EMMI. RN CM will outreach patient for any red flags received.    Plan: RN CM will close case.    Jone Baseman, RN, MSN [facility] Care Management Care Management Coordinator Direct Line [phone] Toll Free: [phone]  Fax: [phone]

## 2019-10-03 ENCOUNTER — Telehealth: Payer: Self-pay | Admitting: Internal Medicine

## 2019-10-03 NOTE — Telephone Encounter (Signed)
Requesting new orders for PT : 1x9  Anda Kraft - [phone]  Okay to LVM

## 2019-10-04 NOTE — Telephone Encounter (Signed)
LVM for Tiffany Charles with verbal okay as requested.

## 2019-10-11 ENCOUNTER — Encounter (HOSPITAL_COMMUNITY): Payer: Self-pay

## 2019-10-11 ENCOUNTER — Emergency Department (HOSPITAL_COMMUNITY): Payer: Medicare HMO

## 2019-10-11 ENCOUNTER — Emergency Department (HOSPITAL_COMMUNITY)
Admission: EM | Admit: 2019-10-11 | Discharge: 2019-10-11 | Disposition: A | Discharge status: Home / Self Care | Payer: Medicare HMO | Attending: Emergency Medicine | Admitting: Emergency Medicine

## 2019-10-11 ENCOUNTER — Telehealth: Payer: Self-pay | Admitting: Internal Medicine

## 2019-10-11 ENCOUNTER — Inpatient Hospital Stay: Payer: Medicare HMO | Admitting: Internal Medicine

## 2019-10-11 ENCOUNTER — Other Ambulatory Visit: Payer: Self-pay | Admitting: *Deleted

## 2019-10-11 DIAGNOSIS — I4891 Unspecified atrial fibrillation: Secondary | ICD-10-CM | POA: Diagnosis not present

## 2019-10-11 DIAGNOSIS — R0789 Other chest pain: Secondary | ICD-10-CM | POA: Diagnosis not present

## 2019-10-11 DIAGNOSIS — R079 Chest pain, unspecified: Secondary | ICD-10-CM | POA: Diagnosis not present

## 2019-10-11 DIAGNOSIS — Z5321 Procedure and treatment not carried out due to patient leaving prior to being seen by health care provider: Secondary | ICD-10-CM | POA: Diagnosis not present

## 2019-10-11 DIAGNOSIS — R58 Hemorrhage, not elsewhere classified: Secondary | ICD-10-CM | POA: Diagnosis not present

## 2019-10-11 DIAGNOSIS — I959 Hypotension, unspecified: Secondary | ICD-10-CM | POA: Diagnosis not present

## 2019-10-11 LAB — TROPONIN I (HIGH SENSITIVITY): Troponin I (High Sensitivity): 23 ng/L — ABNORMAL HIGH (ref <18)

## 2019-10-11 LAB — CBC
HCT: 39.2 % (ref 36.0–46.0)
Hemoglobin: 12.2 g/dL (ref 12.0–15.0)
MCH: 29 pg (ref 26.0–34.0)
MCHC: 31.1 g/dL (ref 30.0–36.0)
MCV: 93.3 fL (ref 80.0–100.0)
Platelets: 156 10*3/uL (ref 150–400)
RBC: 4.2 MIL/uL (ref 3.87–5.11)
RDW: 17.2 % — ABNORMAL HIGH (ref 11.5–15.5)
WBC: 9.4 10*3/uL (ref 4.0–10.5)
nRBC: 0 % (ref 0.0–0.2)

## 2019-10-11 LAB — BASIC METABOLIC PANEL
Anion gap: 12 (ref 5–15)
BUN: 17 mg/dL (ref 8–23)
CO2: 34 mmol/L — ABNORMAL HIGH (ref 22–32)
Calcium: 9.2 mg/dL (ref 8.9–10.3)
Chloride: 95 mmol/L — ABNORMAL LOW (ref 98–111)
Creatinine, Ser: 0.98 mg/dL (ref 0.44–1.00)
GFR calc Af Amer: >60 mL/min (ref >60)
GFR calc non Af Amer: 52 mL/min — ABNORMAL LOW (ref >60)
Glucose, Bld: 107 mg/dL — ABNORMAL HIGH (ref 70–99)
Potassium: 3.1 mmol/L — ABNORMAL LOW (ref 3.5–5.1)
Sodium: 141 mmol/L (ref 135–145)

## 2019-10-11 MED ORDER — SODIUM CHLORIDE 0.9% FLUSH: 3.0000 mL | Freq: Once | INTRAVENOUS | Status: DC

## 2019-10-11 NOTE — ED Triage Notes (Signed)
Pt bib gcems from home w/ c/o chest pain. Pt has hx of dementia poor historian. Per EMS pt has recent hx of MI and GI bleed. Pt has hx of afib but xeralto was stopped d/t gi bleed. Pt also presents w/ BLE edema. EMS VSS.

## 2019-10-11 NOTE — ED Notes (Signed)
Pt called multiple times, no answer 

## 2019-10-11 NOTE — Patient Outreach (Signed)
Member screened for potential [facility] needs as a benefit of [employer] [place].  Per Patient Tiffany Charles member has had multiple ED visits/hospitalizations. Most recent dc on Sep 27, 2019.   Chart reviewed. Most recent hospital records indicated member was to transition to [facility] to stay with son [name] (number not in Epic). Contacted grandson [name] [facility]) [phone]. No answer. HIPAA compliant voicemail message left to request return call.   Telephone call made to mobile number on file for member [phone]. Tiffany Charles (son) answered the phone. Patient identifiers confirmed. Asked to speak with member who gave writer permission to speak with her son Tiffany Charles about her care.   Tiffany Charles reports member returned home with him after hospital discharge and did not go to [facility] with son [name] as previously discussed in hospital. States "[name] dropped mama off with me so he could go to the bike rally. She has been with me since." Don states member's grandson [name] ([place]) lives in [place].   Tiffany Charles states member is supposed to have [facility] for home health. States he has not seen anyone but once. He has the number to call [facility] but reports, " I don't think mama really needs it."  Explained additional services to assist in members care. Explained [facility] for home visits due to member's high risk for readmission. Tiffany Charles states member has a little swelling in her feet. States [name] has a PCP appointment with Dr. Ronnald Ramp tomorrow. Tiffany Charles is agreeable to [facility]. Writer to make referral.  Also discussed palliative care services. It appears discussions were had in the hospital for outpatient palliative follow up. Tiffany Charles is agreeable to referral for [facility] palliative.   Discussed follow up from [place] if additional care coordination is needed. Tiffany Charles is agreeable.   Don expressed appreciation of the call. Confirmed home number as [phone] and his mobile [phone].   Previous inpatient LCSW notes on Sep 26, 2019 indicate  [facility] case was open on Don. Telephone call made to APS to make aware that member is at home in [place] with Tiffany Charles not in [facility]. Spoke with [name], APS SW who reports case has been closed.   Will make referrals to [facility], [facility] palliative, and alert [place] that follows [employer] members.    [name], MSN-Ed, RN,BSN [place] Acute Care Coordinator [phone] [facility] [phone]  (Toll free office)

## 2019-10-11 NOTE — Telephone Encounter (Signed)
Tiffany Charles with [facility] called and said that the [facility] case manager requested that [name] see the patient for palative care and they were wondering if that was okay.

## 2019-10-11 NOTE — Telephone Encounter (Signed)
[place] and spoke to [place]. Informed PCP agreed with [facility] recommendation for pt to receive palliative care.

## 2019-10-11 NOTE — Telephone Encounter (Signed)
Phone call placed to patient to offer to schedule a visit with [facility]. Phone rang, with no answer I left a voicemail for call back. 

## 2019-10-11 NOTE — ED Notes (Signed)
Tiffany Charles [phone] called.

## 2019-10-12 ENCOUNTER — Other Ambulatory Visit: Payer: Self-pay

## 2019-10-12 ENCOUNTER — Encounter: Payer: Self-pay | Admitting: Internal Medicine

## 2019-10-12 ENCOUNTER — Ambulatory Visit (INDEPENDENT_AMBULATORY_CARE_PROVIDER_SITE_OTHER): Payer: Medicare HMO | Admitting: Internal Medicine

## 2019-10-12 VITALS — BP 150/80 | HR 77 | Temp 98.7°F | Resp 16 | Ht 66.0 in | Wt 127.1 lb

## 2019-10-12 DIAGNOSIS — I5032 Chronic diastolic (congestive) heart failure: Secondary | ICD-10-CM | POA: Diagnosis not present

## 2019-10-12 DIAGNOSIS — I10 Essential (primary) hypertension: Secondary | ICD-10-CM | POA: Diagnosis not present

## 2019-10-12 DIAGNOSIS — I4821 Permanent atrial fibrillation: Secondary | ICD-10-CM

## 2019-10-12 DIAGNOSIS — I4891 Unspecified atrial fibrillation: Secondary | ICD-10-CM

## 2019-10-12 MED ORDER — TORSEMIDE 20 MG PO TABS: 20.0000 mg | ORAL_TABLET | Freq: Every day | ORAL | 0 refills | Status: DC

## 2019-10-12 NOTE — Patient Instructions (Signed)

## 2019-10-12 NOTE — Progress Notes (Signed)
Subjective:  Patient ID: Tiffany Charles, female    DOB: 04/08/1930  Age: 86 y.o. MRN: [id number]  CC: Congestive Heart Failure, Atrial Fibrillation, and Hypertension  This visit occurred during the SARS-CoV-2 public health emergency.  Safety protocols were in place, including screening questions prior to the visit, additional usage of staff PPE, and extensive cleaning of exam room while observing appropriate contact time as indicated for disinfecting solutions.    HPI LOUNETTE RAIMONDO presents for f/up - Most of the history of is obtained through her son.  She was recently seen in the ED for chest pain.  She ruled out for ischemia.  Over the last few weeks she has developed lower extremity edema.  Furosemide was prescribed but has not helped her much.    Outpatient Medications Prior to Visit  Medication Sig Dispense Refill  . acetaminophen (TYLENOL) 325 MG tablet Take 2 tablets (650 mg total) by mouth every 4 (four) hours as needed for fever. 30 tablet 0  . acitretin (SORIATANE) 25 MG capsule Take 25 mg by mouth daily.    Marland Kitchen ascorbic acid (VITAMIN C) 500 MG tablet Take 500 mg by mouth daily.    Marland Kitchen aspirin EC 81 MG tablet Take 81 mg by mouth daily.     Marland Kitchen atorvastatin (LIPITOR) 20 MG tablet Take 1 tablet (20 mg total) by mouth daily at 6 PM. 30 tablet 0  . cholecalciferol (VITAMIN D3) 25 MCG (1000 UNIT) tablet Take 1,000 Units by mouth daily.    . folic acid (FOLVITE) 1 MG tablet Take 1 tablet (1 mg total) by mouth daily. 30 tablet 0  . lactulose (CHRONULAC) 10 GM/15ML solution Take 15 mLs (10 g total) by mouth 2 (two) times daily as needed for mild constipation. 236 mL 0  . LORazepam (ATIVAN) 1 MG tablet Take 1 tablet (1 mg total) by mouth every 6 (six) hours as needed for anxiety. 30 tablet 0  . metoprolol tartrate (LOPRESSOR) 50 MG tablet Take 1 tablet (50 mg total) by mouth 2 (two) times daily. 60 tablet 0  . pantoprazole (PROTONIX) 40 MG tablet Take 1 tablet (40 mg total) by mouth 2 (two)  times daily. 60 tablet 0  . polyethylene glycol (MIRALAX / GLYCOLAX) 17 g packet Take 17 g by mouth daily as needed for moderate constipation. 14 each 0  . tamsulosin (FLOMAX) 0.4 MG CAPS capsule Take 1 capsule (0.4 mg total) by mouth daily. 30 capsule 0  . vitamin B-12 (CYANOCOBALAMIN) 1000 MCG tablet Take 1,000 mcg by mouth daily.    . furosemide (LASIX) 40 MG tablet Take 1 tablet (40 mg total) by mouth daily. 30 tablet 0   No facility-administered medications prior to visit.    ROS Review of Systems  Constitutional: Negative for appetite change, diaphoresis and fatigue.  HENT: Negative.   Respiratory: Negative for cough, chest tightness, shortness of breath and wheezing.   Cardiovascular: Positive for leg swelling. Negative for chest pain and palpitations.  Gastrointestinal: Negative for abdominal pain, blood in stool, diarrhea, nausea and vomiting.  Genitourinary: Negative.  Negative for difficulty urinating.  Musculoskeletal: Negative.   Skin: Negative.   Neurological: Negative for dizziness, weakness and light-headedness.  Hematological: Negative for adenopathy. Does not bruise/bleed easily.  Psychiatric/Behavioral: Negative.     Objective:  BP (!) 150/80 (BP Location: Left Arm, Patient Position: Sitting, Cuff Size: Large)   Pulse 77   Temp 98.7 F (37.1 C) (Oral)   Resp 16   Ht 5\' 6"  (  1.676 m)   Wt 127 lb 2 oz (57.7 kg)   SpO2 96%   BMI 20.52 kg/m   BP Readings from Last 3 Encounters:  10/12/19 (!) 150/80  10/11/19 127/68  09/27/19 124/77    Wt Readings from Last 3 Encounters:  10/12/19 127 lb 2 oz (57.7 kg)  09/27/19 111 lb (50.3 kg)  08/09/19 120 lb (54.4 kg)    Physical Exam Vitals reviewed.  Constitutional:      General: She is not in acute distress.    Appearance: She is ill-appearing. She is not diaphoretic.  HENT:     Nose: Nose normal.     Mouth/Throat:     Mouth: Mucous membranes are moist.  Eyes:     General: No scleral icterus.     Conjunctiva/sclera: Conjunctivae normal.  Cardiovascular:     Rate and Rhythm: Normal rate. Rhythm irregularly irregular.     Heart sounds: No murmur. No gallop.   Pulmonary:     Breath sounds: Examination of the right-lower field reveals rales. Examination of the left-lower field reveals rales. Rales present. No decreased breath sounds, wheezing or rhonchi.  Abdominal:     General: Abdomen is flat.     Palpations: There is no mass.     Tenderness: There is no abdominal tenderness. There is no guarding.  Musculoskeletal:     Right lower leg: 2+ Pitting Edema present.     Left lower leg: 2+ Pitting Edema present.  Neurological:     General: No focal deficit present.     Mental Status: She is alert.  Psychiatric:        Mood and Affect: Mood normal.        Behavior: Behavior normal.     Lab Results  Component Value Date   WBC 9.4 10/11/2019   HGB 12.2 10/11/2019   HCT 39.2 10/11/2019   PLT 156 10/11/2019   GLUCOSE 107 (H) 10/11/2019   CHOL 181 06/08/2019   TRIG 59 06/08/2019   HDL 55 06/08/2019   LDLCALC 114 (H) 06/08/2019   ALT 42 09/12/2019   AST 44 (H) 09/12/2019   NA 141 10/11/2019   K 3.1 (L) 10/11/2019   CL 95 (L) 10/11/2019   CREATININE 0.98 10/11/2019   BUN 17 10/11/2019   CO2 34 (H) 10/11/2019   TSH 1.50 06/08/2018   INR 1.1 09/06/2019   HGBA1C 5.6 09/03/2019    DG Chest 2 View  Result Date: 10/11/2019 CLINICAL DATA:  Chest pain additional history provided: Patient reports chest pain for 1 day. History of COPD, CAD, hypertension. EXAM: CHEST - 2 VIEW COMPARISON:  Prior chest radiographs 09/09/2019 and earlier. FINDINGS: Unchanged cardiomegaly. Aortic atherosclerosis. No appreciable airspace consolidation within the lungs. Mild atelectasis and/or scarring within the left lung base. No evidence of pleural effusion or pneumothorax. No acute bony abnormality is identified. IMPRESSION: Mild atelectasis and/or scarring within the left lung base. No appreciable  airspace consolidation. Unchanged cardiomegaly. Aortic Atherosclerosis (ICD10-I70.0). Electronically Signed   By: Kellie Simmering DO   On: 10/11/2019 15:51    Assessment & Plan:   Minie was seen today for congestive heart failure, atrial fibrillation and hypertension.  Diagnoses and all orders for this visit:  Atrial fibrillation with RVR ([place])  Essential hypertension- Her blood pressure is adequately well controlled.  Chronic diastolic (congestive) heart failure ([place])- I recommended that she upgrade to torsemide for better bioavailability and efficacy. -     torsemide (DEMADEX) 20 MG tablet; Take 1 tablet (  20 mg total) by mouth daily.  Permanent atrial fibrillation- She has adequate rate control with metoprolol.  Anticoagulation is contraindicated.   I have discontinued Chele F. Wist's furosemide. I am also having her start on torsemide. Additionally, I am having her maintain her aspirin EC, acitretin, vitamin B-12, ascorbic acid, cholecalciferol, acetaminophen, atorvastatin, metoprolol tartrate, LORazepam, lactulose, pantoprazole, polyethylene glycol, tamsulosin, and folic acid.  Meds ordered this encounter  Medications  . torsemide (DEMADEX) 20 MG tablet    Sig: Take 1 tablet (20 mg total) by mouth daily.    Dispense:  90 tablet    Refill:  0     Follow-up: Return in about 6 weeks (around 11/23/2019).  Scarlette Calico, MD

## 2019-10-13 ENCOUNTER — Telehealth: Payer: Self-pay | Admitting: Internal Medicine

## 2019-10-13 DIAGNOSIS — I5032 Chronic diastolic (congestive) heart failure: Secondary | ICD-10-CM

## 2019-10-13 MED ORDER — TORSEMIDE 20 MG PO TABS: 20.0000 mg | ORAL_TABLET | Freq: Every day | ORAL | 0 refills | Status: AC

## 2019-10-13 NOTE — Telephone Encounter (Signed)
New message:   [name] calling to check on the status of the new prescription being sent in for the pt. He states they called the pharmacy and they stated they haven't received anything yet. Please advise.

## 2019-10-13 NOTE — Telephone Encounter (Signed)
F/U   The husband bring in a medication list from printed out from [facility] on yesterday need a prescription for   1.Medication Requested:torsemide (DEMADEX) 20 MG tablet  2. Pharmacy (Name, Street, [place]): [facility]   3. On Med List: Yes   4. Last Visit with PCP: 5.19.2021   5. Next visit date with PCP: N/a    Agent: Please be advised that RX refills may take up to 3 business days. We ask that you follow-up with your pharmacy.   TORSEMIDE 20 MG one tablet by mouth

## 2019-10-13 NOTE — Telephone Encounter (Signed)
Called pt son - he stated that the pharmacy did not have the medication that was sent in yesterday.   Confirmed pharmacy and he stated that they are using [facility] [address].   Erx has been resent to new pharmacy as requested.

## 2019-10-14 ENCOUNTER — Emergency Department (HOSPITAL_COMMUNITY): Payer: Medicare HMO

## 2019-10-14 ENCOUNTER — Other Ambulatory Visit: Payer: Self-pay

## 2019-10-14 ENCOUNTER — Emergency Department (HOSPITAL_COMMUNITY)
Admission: EM | Admit: 2019-10-14 | Discharge: 2019-10-14 | Disposition: A | Discharge status: Home / Self Care | Payer: Medicare HMO | Attending: Emergency Medicine | Admitting: Emergency Medicine

## 2019-10-14 ENCOUNTER — Encounter (HOSPITAL_COMMUNITY): Payer: Self-pay

## 2019-10-14 DIAGNOSIS — S5001XA Contusion of right elbow, initial encounter: Secondary | ICD-10-CM

## 2019-10-14 DIAGNOSIS — Y92009 Unspecified place in unspecified non-institutional (private) residence as the place of occurrence of the external cause: Secondary | ICD-10-CM | POA: Insufficient documentation

## 2019-10-14 DIAGNOSIS — S199XXA Unspecified injury of neck, initial encounter: Secondary | ICD-10-CM | POA: Diagnosis not present

## 2019-10-14 DIAGNOSIS — S0990XA Unspecified injury of head, initial encounter: Secondary | ICD-10-CM | POA: Diagnosis not present

## 2019-10-14 DIAGNOSIS — Y999 Unspecified external cause status: Secondary | ICD-10-CM | POA: Diagnosis not present

## 2019-10-14 DIAGNOSIS — I252 Old myocardial infarction: Secondary | ICD-10-CM | POA: Diagnosis not present

## 2019-10-14 DIAGNOSIS — S42211A Unspecified displaced fracture of surgical neck of right humerus, initial encounter for closed fracture: Secondary | ICD-10-CM

## 2019-10-14 DIAGNOSIS — Z7901 Long term (current) use of anticoagulants: Secondary | ICD-10-CM | POA: Insufficient documentation

## 2019-10-14 DIAGNOSIS — M7989 Other specified soft tissue disorders: Secondary | ICD-10-CM | POA: Diagnosis not present

## 2019-10-14 DIAGNOSIS — I2510 Atherosclerotic heart disease of native coronary artery without angina pectoris: Secondary | ICD-10-CM | POA: Insufficient documentation

## 2019-10-14 DIAGNOSIS — S4991XA Unspecified injury of right shoulder and upper arm, initial encounter: Secondary | ICD-10-CM | POA: Diagnosis not present

## 2019-10-14 DIAGNOSIS — S0993XA Unspecified injury of face, initial encounter: Secondary | ICD-10-CM | POA: Diagnosis not present

## 2019-10-14 DIAGNOSIS — Z79899 Other long term (current) drug therapy: Secondary | ICD-10-CM | POA: Insufficient documentation

## 2019-10-14 DIAGNOSIS — W19XXXA Unspecified fall, initial encounter: Secondary | ICD-10-CM | POA: Insufficient documentation

## 2019-10-14 DIAGNOSIS — S4990XA Unspecified injury of shoulder and upper arm, unspecified arm, initial encounter: Secondary | ICD-10-CM

## 2019-10-14 DIAGNOSIS — Y939 Activity, unspecified: Secondary | ICD-10-CM | POA: Diagnosis not present

## 2019-10-14 DIAGNOSIS — I110 Hypertensive heart disease with heart failure: Secondary | ICD-10-CM | POA: Diagnosis not present

## 2019-10-14 DIAGNOSIS — F0390 Unspecified dementia without behavioral disturbance: Secondary | ICD-10-CM | POA: Insufficient documentation

## 2019-10-14 DIAGNOSIS — S42214A Unspecified nondisplaced fracture of surgical neck of right humerus, initial encounter for closed fracture: Secondary | ICD-10-CM | POA: Diagnosis not present

## 2019-10-14 DIAGNOSIS — I5032 Chronic diastolic (congestive) heart failure: Secondary | ICD-10-CM | POA: Diagnosis not present

## 2019-10-14 DIAGNOSIS — S42291A Other displaced fracture of upper end of right humerus, initial encounter for closed fracture: Secondary | ICD-10-CM | POA: Diagnosis not present

## 2019-10-14 DIAGNOSIS — I4891 Unspecified atrial fibrillation: Secondary | ICD-10-CM | POA: Diagnosis not present

## 2019-10-14 DIAGNOSIS — S59901A Unspecified injury of right elbow, initial encounter: Secondary | ICD-10-CM | POA: Diagnosis not present

## 2019-10-14 LAB — URINALYSIS, ROUTINE W REFLEX MICROSCOPIC
Bacteria, UA: NONE SEEN
Bilirubin Urine: NEGATIVE
Glucose, UA: NEGATIVE mg/dL
Hgb urine dipstick: NEGATIVE
Ketones, ur: NEGATIVE mg/dL
Leukocytes,Ua: NEGATIVE
Nitrite: NEGATIVE
Protein, ur: 30 mg/dL — AB
Specific Gravity, Urine: 1.023 (ref 1.005–1.030)
pH: 6 (ref 5.0–8.0)

## 2019-10-14 LAB — I-STAT CHEM 8, ED
BUN: 20 mg/dL (ref 8–23)
Calcium, Ion: 1.18 mmol/L (ref 1.15–1.40)
Chloride: 98 mmol/L (ref 98–111)
Creatinine, Ser: 1.2 mg/dL — ABNORMAL HIGH (ref 0.44–1.00)
Glucose, Bld: 122 mg/dL — ABNORMAL HIGH (ref 70–99)
HCT: 34 % — ABNORMAL LOW (ref 36.0–46.0)
Hemoglobin: 11.6 g/dL — ABNORMAL LOW (ref 12.0–15.0)
Potassium: 3.6 mmol/L (ref 3.5–5.1)
Sodium: 141 mmol/L (ref 135–145)
TCO2: 35 mmol/L — ABNORMAL HIGH (ref 22–32)

## 2019-10-14 LAB — CBC
HCT: 38.4 % (ref 36.0–46.0)
Hemoglobin: 12.4 g/dL (ref 12.0–15.0)
MCH: 30.2 pg (ref 26.0–34.0)
MCHC: 32.3 g/dL (ref 30.0–36.0)
MCV: 93.7 fL (ref 80.0–100.0)
Platelets: 169 10*3/uL (ref 150–400)
RBC: 4.1 MIL/uL (ref 3.87–5.11)
RDW: 17.4 % — ABNORMAL HIGH (ref 11.5–15.5)
WBC: 9.9 10*3/uL (ref 4.0–10.5)
nRBC: 0 % (ref 0.0–0.2)

## 2019-10-14 MED ORDER — ONDANSETRON HCL 4 MG/2ML IJ SOLN
4.0000 mg | Freq: Once | INTRAMUSCULAR | Status: AC
  Administered 2019-10-14: 4 mg via INTRAVENOUS
  Filled 2019-10-14: qty 2

## 2019-10-14 MED ORDER — HYDROCODONE-ACETAMINOPHEN 5-325 MG PO TABS: 1.0000 | ORAL_TABLET | Freq: Four times a day (QID) | ORAL | 0 refills | Status: DC | PRN

## 2019-10-14 MED ORDER — SODIUM CHLORIDE 0.9 % IV BOLUS
1000.0000 mL | Freq: Once | INTRAVENOUS | Status: AC
  Administered 2019-10-14: 1000 mL via INTRAVENOUS

## 2019-10-14 MED ORDER — MORPHINE SULFATE (PF) 4 MG/ML IV SOLN
4.0000 mg | Freq: Once | INTRAVENOUS | Status: AC
  Administered 2019-10-14: 4 mg via INTRAVENOUS
  Filled 2019-10-14: qty 1

## 2019-10-14 NOTE — ED Triage Notes (Signed)
Patient states she tripped and fell in a garage.   Patient c/o right shoulder pain and right elbow laceration.  Patient denies hitting her head or having LOC.

## 2019-10-14 NOTE — ED Provider Notes (Signed)
[facility] DEPT Provider Note   CSN: [id number] Arrival date & time: 10/14/19  1716     History Chief Complaint  Patient presents with  . Fall  . Shoulder Injury  . Extremity Laceration    HARMONIE VERRASTRO is a 86 y.o. female.  The history is provided by the patient. No language interpreter was used.  Fall  Shoulder Injury     86 year old female with history of A. fib currently not on any anticoagulant, CAD, COPD, dementia, presenting for evaluation of fall. Due to dementia, history was limited.  Patient reports she fell earlier today.  She cannot tell me the circumstances that caused the fall.  She does complain of pain to her right shoulder and her right elbow.  She did not recall hitting her head having any loss of consciousness.  She denies headache, neck pain, chest pain, back pain, abdominal pain, hip pain, or lower extremity pain.  She cannot tell me if she had any precipitating symptoms prior to the fall.  No specific treatment tried.  She cannot tell me the circumstances that she was involved when she fell.  She denies any numbness or weakness.  She normally ambulates without assist.  She was able to walk afterward.  Patient cannot tell me last tetanus status.  She lives at home with her brother.   Past Medical History:  Diagnosis Date  . Angina pectoris   . Arthritis   . Chronic anticoagulation    Xarelto  . Chronic diastolic (congestive) heart failure ([place])   . COPD (chronic obstructive pulmonary disease) ([place])   . Coronary artery disease    a. s/p RCA stent 2008. b. cath 02/2009: 30% mLAD, 90% AV groove cx unchanged from prior, 50-60% RCA, EF 60% -> med rx.  . Depression   . Essential hypertension   . Hyperlipidemia   . Mild pulmonary hypertension ([place]) 09/17/2016  . Permanent atrial fibrillation ([place])   . Skin cancer Jan 2013   squamous cell- removed  . Tricuspid regurgitation 09/17/2016    Patient Active Problem List   Diagnosis Date Noted  . NSTEMI (non-ST elevated myocardial infarction) ([place]) 09/22/2019  . Ischemic cardiomyopathy 09/22/2019  . PVD (peripheral vascular disease) ([place]) 09/22/2019  . Stage I decubitus ulcer and pressure area 09/22/2019  . Protein-calorie malnutrition, severe 09/16/2019  . Palliative care by specialist   . Hemorrhagic shock ([place]) 09/01/2019  . DNR (do not resuscitate) 06/08/2019  . Occipital stroke ([place]) 06/07/2019  . Squamous cell carcinoma, leg, unspecified laterality 06/08/2018  . Basal cell carcinoma (BCC) of nasal tip 06/08/2018  . Senile dementia without behavioral disturbance ([place]) 04/29/2017  . Frequent PVCs 09/17/2016  . Tricuspid regurgitation 09/17/2016  . Mild pulmonary hypertension ([place]) 09/17/2016  . Chronic diastolic (congestive) heart failure ([place])   . Essential hypertension   . Generalized OA 12/28/2014  . Goals of care, counseling/discussion 11/09/2013  . Depression, major ([place]) 11/07/2013  . Permanent atrial fibrillation 10/28/2012  . COPD - PFTs in June 2014 with MET test 10/28/2012  . CAD, RCA BMS '09, 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 low risk Myoview Feb 2013 10/28/2012    Past Surgical History:  Procedure Laterality Date  .  CATARACTS REMOVED    . ABDOMINAL HYSTERECTOMY    . CARDIAC CATHETERIZATION  03/22/2009   Started medical therapy  . CARDIOVERSION  06/05/2009   3 attempts, last attempt successful  . CORONARY ANGIOPLASTY WITH STENT PLACEMENT  2009   RCA Vision stent  .  HIP SURGERY    . LOWER EXTREMITY ANGIOGRAPHY Left 08/10/2019   Procedure: Lower Extremity Angiography;  Surgeon: Katha Cabal, MD;  Location: [name];  Service: Cardiovascular;  Laterality: Left;  Marland Kitchen MELANOMA EXCISION  06/06/2011   Procedure: MELANOMA EXCISION;  Surgeon: Adin Hector, MD;  Location: [facility];  Service: General;  Laterality: Left;  re excision squamous cell lesion left chest wall      OB History   No obstetric history on  file.     Family History  Problem Relation Age of Onset  . Stroke Mother   . Heart disease Mother   . Coronary artery disease Father   . Heart disease Father   . Stroke Sister   . Heart disease Brother   . Stroke Sister   . Stroke Sister     Social History   Tobacco Use  . Smoking status: Former Smoker    Packs/day: 0.50  . Smokeless tobacco: Never Used  Substance Use Topics  . Alcohol use: No  . Drug use: No    Home Medications Prior to Admission medications   Medication Sig Start Date End Date Taking? Authorizing Provider  acetaminophen (TYLENOL) 325 MG tablet Take 2 tablets (650 mg total) by mouth every 4 (four) hours as needed for fever. 09/27/19   Allie Bossier, MD  acitretin (SORIATANE) 25 MG capsule Take 25 mg by mouth daily. 06/30/19   [provider]  ascorbic acid (VITAMIN C) 500 MG tablet Take 500 mg by mouth daily.    [provider]  aspirin EC 81 MG tablet Take 81 mg by mouth daily.     [provider]  atorvastatin (LIPITOR) 20 MG tablet Take 1 tablet (20 mg total) by mouth daily at 6 PM. 09/27/19   Allie Bossier, MD  cholecalciferol (VITAMIN D3) 25 MCG (1000 UNIT) tablet Take 1,000 Units by mouth daily.    [provider]  folic acid (FOLVITE) 1 MG tablet Take 1 tablet (1 mg total) by mouth daily. 09/27/19   Allie Bossier, MD  lactulose (CHRONULAC) 10 GM/15ML solution Take 15 mLs (10 g total) by mouth 2 (two) times daily as needed for mild constipation. 09/27/19   Allie Bossier, MD  LORazepam (ATIVAN) 1 MG tablet Take 1 tablet (1 mg total) by mouth every 6 (six) hours as needed for anxiety. 09/27/19   Allie Bossier, MD  metoprolol tartrate (LOPRESSOR) 50 MG tablet Take 1 tablet (50 mg total) by mouth 2 (two) times daily. 09/27/19   Allie Bossier, MD  pantoprazole (PROTONIX) 40 MG tablet Take 1 tablet (40 mg total) by mouth 2 (two) times daily. 09/27/19   Allie Bossier, MD  polyethylene glycol (MIRALAX / GLYCOLAX) 17 g packet  Take 17 g by mouth daily as needed for moderate constipation. 09/27/19   Allie Bossier, MD  tamsulosin (FLOMAX) 0.4 MG CAPS capsule Take 1 capsule (0.4 mg total) by mouth daily. 09/27/19   Allie Bossier, MD  torsemide (DEMADEX) 20 MG tablet Take 1 tablet (20 mg total) by mouth daily. 10/13/19   [name], MD  vitamin B-12 (CYANOCOBALAMIN) 1000 MCG tablet Take 1,000 mcg by mouth daily.    [provider]    Allergies    Codeine  Review of Systems   Review of Systems  Unable to perform ROS: Dementia    Physical Exam Updated Vital Signs BP (!) 146/70 (BP Location: Left Arm)  Pulse 71   Temp 97.7 F (36.5 C) (Oral)   Ht 5' 6"  (1.676 m)   Wt 57.7 kg   SpO2 99%   BMI 20.52 kg/m   Physical Exam Vitals and nursing note reviewed.  Constitutional:      General: She is not in acute distress.    Appearance: She is well-developed.     Comments: Elderly female resting in bed in no acute discomfort.  HENT:     Head: Normocephalic and atraumatic.     Comments: No scalp tenderness, no facial tenderness on exam. Eyes:     Conjunctiva/sclera: Conjunctivae normal.  Cardiovascular:     Rate and Rhythm: Rhythm irregular.     Pulses: Normal pulses.     Heart sounds: Normal heart sounds.  Pulmonary:     Effort: Pulmonary effort is normal.     Breath sounds: Normal breath sounds.  Abdominal:     Palpations: Abdomen is soft.     Tenderness: There is no abdominal tenderness.  Musculoskeletal:        General: Tenderness (Right arm: Tenderness to right shoulder on palpation with decreased range of motion but no obvious deformity.  Tenderness to right elbow with skin tear to the posterior elbow with decreased range of motion but no deformity.) present.     Cervical back: Neck supple.     Comments: Right wrist and hand nontender, radial pulse 2+, normal grip strength.  Skin:    Findings: No rash.  Neurological:     Mental Status: She is alert. Mental status is at baseline.      GCS: GCS eye subscore is 4. GCS verbal subscore is 5. GCS motor subscore is 6.     ED Results / Procedures / Treatments   Labs (all labs ordered are listed, but only abnormal results are displayed) Labs Reviewed  CBC - Abnormal; Notable for the following components:      Result Value   RDW 17.4 (*)    All other components within normal limits  I-STAT CHEM 8, ED - Abnormal; Notable for the following components:   Creatinine, Ser 1.20 (*)    Glucose, Bld 122 (*)    TCO2 35 (*)    Hemoglobin 11.6 (*)    HCT 34.0 (*)    All other components within normal limits  URINALYSIS, ROUTINE W REFLEX MICROSCOPIC    EKG EKG Interpretation  Date/Time:  Friday Oct 14 2019 18:07:31 EDT Ventricular Rate:  81 PR Interval:    QRS Duration: 82 QT Interval:  433 QTC Calculation: 503 R Axis:   70 Text Interpretation: Atrial fibrillation Probable anterior infarct, age indeterminate Prolonged QT interval QT slightly more prolonged as compared to prior EKG Confirmed by Malvin Johns [phone]) on 10/14/2019 6:17:22 PM   Radiology DG Shoulder Right  Result Date: 10/14/2019 CLINICAL DATA:  Slipped and fell today. EXAM: RIGHT SHOULDER - 2+ VIEW COMPARISON:  None. FINDINGS: There is a comminuted and mildly displaced fracture of the right humeral neck, involving the greater tuberosity. No involvement of the humeral head articular surface, dislocation or glenoid fracture identified. The acromioclavicular joint is intact. IMPRESSION: Comminuted and mildly displaced fracture of the right humeral neck. Electronically Signed   By: Richardean Sale M.D.   On: 10/14/2019 18:12   DG Elbow Complete Right  Result Date: 10/14/2019 CLINICAL DATA:  Slipped and fell today. EXAM: RIGHT ELBOW - COMPLETE 3+ VIEW COMPARISON:  None. FINDINGS: The bones appear mildly demineralized. There is no evidence of acute  fracture or dislocation. No significant joint effusion is seen allowing for rotation on the lateral view. Focal soft  tissue swelling is noted over the dorsal aspect of the olecranon. Multiple skin folds are noted. IMPRESSION: No acute osseous findings or significant joint effusion. Focal soft tissue swelling over the dorsal aspect of the olecranon may reflect hematoma or bursitis. Electronically Signed   By: Richardean Sale M.D.   On: 10/14/2019 18:14    Procedures Procedures (including critical care time)  Medications Ordered in ED Medications - No data to display  ED Course  I have reviewed the triage vital signs and the nursing notes.  Pertinent labs & imaging results that were available during my care of the patient were reviewed by me and considered in my medical decision making (see chart for details).    MDM Rules/Calculators/A&P                      BP (!) 146/70 (BP Location: Left Arm)   Pulse 71   Temp 97.7 F (36.5 C) (Oral)   Resp 18   Ht 5' 6"  (1.676 m)   Wt 57.7 kg   SpO2 99%   BMI 20.52 kg/m   Final Clinical Impression(s) / ED Diagnoses Final diagnoses:  Shoulder injury  Closed traumatic displaced fracture of surgical neck of right humerus, initial encounter  Contusion of right elbow, initial encounter  Fall in home, initial encounter    Rx / DC Orders ED Discharge Orders         Ordered    HYDROcodone-acetaminophen (NORCO/VICODIN) 5-325 MG tablet  Every 6 hours PRN     10/14/19 2014         5:50 PM Patient with dementia who had a fall earlier today.  Injury appears to be involving her right shoulder and elbow as she has tenderness to palpation as well as skin tear to her right elbow.  Will obtain appropriate imaging.  Due to her dementia and poor historian, obtain screening labs, EKG and UA.  6:22 PM Family member is at bedside and able to provide additional information.  Patient reportedly tripped over a rug and fell.  It was witnessed by her sons.  She immediately complained of pain to her shoulder and elbow.  She was behaving normally prior to the  injury.  Obtained demonstrate a comminuted mildly displaced fracture of the right humeral neck.  This is a closed injury.  She is neurovascular intact.  X-ray of the right elbow without any fracture or joint effusion however there is a focal soft tissue swelling in the dorsal aspects suggestive of hematoma.  Will provide wound care, provide sling for support, pain medication given.  Will obtain head and cervical spine CT as well as check UA, basic labs, and EKG.  8:21 PM CT scan of head,cspine,maxillofacial without acute injury.  Pt d/c home with sling, pain medication and outpt ortho f/u.     Domenic Moras, PA-C 10/14/19 2026    Malvin Johns, MD 10/15/19 [phone]

## 2019-10-14 NOTE — ED Notes (Signed)
Unsuccessful IV attempt x2. Charge RN asked to attempt [place] IV.

## 2019-10-14 NOTE — Progress Notes (Signed)
Orthopedic Tech Progress Note Patient Details:  Tiffany Charles Jun 19, 1929 [id number]  Ortho Devices Type of Ortho Device: Sling immobilizer Ortho Device/Splint Location: right Ortho Device/Splint Interventions: Application   Post Interventions Patient Tolerated: Well Instructions Provided: Care of device   [place] Pink 10/14/2019, 7:17 PM

## 2019-10-14 NOTE — Discharge Instructions (Addendum)
You have been evaluated for your fall.  You have injured your right shoulder leading to a broken bone in the right shoulder.  Wear sling for support.  Take pain medication as prescribed but be aware it can cause drowsiness.  Call and follow-up closely with orthopedist for further care.  Change your dressing daily from your elbow injury to prevent infection.

## 2019-10-18 ENCOUNTER — Other Ambulatory Visit: Payer: Self-pay

## 2019-10-18 ENCOUNTER — Ambulatory Visit (INDEPENDENT_AMBULATORY_CARE_PROVIDER_SITE_OTHER): Payer: Medicare HMO | Admitting: Orthopaedic Surgery

## 2019-10-18 ENCOUNTER — Telehealth: Payer: Self-pay | Admitting: Internal Medicine

## 2019-10-18 ENCOUNTER — Encounter: Payer: Self-pay | Admitting: Orthopaedic Surgery

## 2019-10-18 DIAGNOSIS — S42201A Unspecified fracture of upper end of right humerus, initial encounter for closed fracture: Secondary | ICD-10-CM | POA: Diagnosis not present

## 2019-10-18 DIAGNOSIS — S42209A Unspecified fracture of upper end of unspecified humerus, initial encounter for closed fracture: Secondary | ICD-10-CM | POA: Insufficient documentation

## 2019-10-18 MED ORDER — HYDROCODONE-ACETAMINOPHEN 5-325 MG PO TABS: 1.0000 | ORAL_TABLET | Freq: Four times a day (QID) | ORAL | 0 refills | Status: AC | PRN

## 2019-10-18 MED ORDER — HYDROCODONE-ACETAMINOPHEN 5-325 MG PO TABS: 1.0000 | ORAL_TABLET | Freq: Four times a day (QID) | ORAL | 0 refills | Status: DC | PRN

## 2019-10-18 NOTE — Progress Notes (Signed)
Office Visit Note   Patient: Tiffany Charles           Date of Birth: 06/06/1929           MRN: [id number] Visit Date: 10/18/2019              Requested by: [name], MD [address] [place],  [place] [address] PCP: [name], MD   Assessment & Plan: Visit Diagnoses:  1. Closed fracture of proximal end of right humerus, unspecified fracture morphology, initial encounter     Plan: We discussed appropriate positioning of the shoulder immobilizer.  Photographs were taken on this phone so we can look at them for reference.  Return 3 weeks with 1 view x-ray right shoulder on return.  Conservative nonoperative treatment recommended.  X-ray results and report was reviewed with the patient and her son.  Follow-Up Instructions: Return in about 3 weeks (around 11/08/2019).   Orders:  No orders of the defined types were placed in this encounter.  Meds ordered this encounter  Medications  . DISCONTD: HYDROcodone-acetaminophen (NORCO/VICODIN) 5-325 MG tablet    Sig: Take 1 tablet by mouth every 6 (six) hours as needed for moderate pain or severe pain.    Dispense:  12 tablet    Refill:  0  . HYDROcodone-acetaminophen (NORCO/VICODIN) 5-325 MG tablet    Sig: Take 1 tablet by mouth every 6 (six) hours as needed for moderate pain or severe pain.    Dispense:  12 tablet    Refill:  0    Proximal humerus fracture      Procedures: No procedures performed   Clinical Data: No additional findings.   Subjective: Chief Complaint  Patient presents with  . Right Shoulder - Fracture    Fall 10/14/2019    HPI 86 year old female with dementia here with her son who states she has brain cancer.  Patient had a fall with proximal humerus closed injury.  Patient was placed in a right shoulder immobilizer and has both straps all intertwined and just around the portion that goes around her neck not behind her.  Her son states that he could not figure out how to get the straps correct.   Patient had taken some hydrocodone for pain which seems to work needs a refill.  Patient is minimally conversant.  Proximal humerus fracture was closed, comminuted and only mildly displaced.  Review of Systems positive for dementia otherwise 14 point systems noncontributory to HPI.   Objective: Vital Signs: Ht 5' 6"  (1.676 m)   Wt 127 lb (57.6 kg)   BMI 20.50 kg/m   Physical Exam Constitutional:      Appearance: She is well-developed.     Comments: Answers short questions yes and no.   HENT:     Head: Normocephalic.     Right Ear: External ear normal.     Left Ear: External ear normal.  Eyes:     Pupils: Pupils are equal, round, and reactive to light.  Neck:     Thyroid: No thyromegaly.     Trachea: No tracheal deviation.  Cardiovascular:     Rate and Rhythm: Normal rate.  Pulmonary:     Effort: Pulmonary effort is normal.  Abdominal:     Palpations: Abdomen is soft.  Skin:    General: Skin is warm and dry.  Neurological:     Mental Status: She is oriented to person, place, and time.     Ortho Exam patient in a  sling this is restrapped to become a shoulder immobilizer once again.  Mild swelling ecchymosis noted proximal humerus area mild swelling to the fingers.  Sensation intact laterally over the deltoid region.  Specialty Comments:  No specialty comments available.  Imaging: No results found.   PMFS History: Patient Active Problem List   Diagnosis Date Noted  . Proximal humerus fracture 10/18/2019  . NSTEMI (non-ST elevated myocardial infarction) ([place]) 09/22/2019  . Ischemic cardiomyopathy 09/22/2019  . PVD (peripheral vascular disease) ([place]) 09/22/2019  . Stage I decubitus ulcer and pressure area 09/22/2019  . Protein-calorie malnutrition, severe 09/16/2019  . Palliative care by specialist   . Hemorrhagic shock ([place]) 09/01/2019  . DNR (do not resuscitate) 06/08/2019  . Occipital stroke ([place]) 06/07/2019  . Squamous cell carcinoma, leg, unspecified  laterality 06/08/2018  . Basal cell carcinoma (BCC) of nasal tip 06/08/2018  . Senile dementia without behavioral disturbance ([place]) 04/29/2017  . Frequent PVCs 09/17/2016  . Tricuspid regurgitation 09/17/2016  . Mild pulmonary hypertension ([place]) 09/17/2016  . Chronic diastolic (congestive) heart failure ([place])   . Essential hypertension   . Generalized OA 12/28/2014  . Goals of care, counseling/discussion 11/09/2013  . Depression, major ([place]) 11/07/2013  . Permanent atrial fibrillation 10/28/2012  . COPD - PFTs in June 2014 with MET test 10/28/2012  . CAD, RCA BMS '09, 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 low risk Myoview Feb 2013 10/28/2012   Past Medical History:  Diagnosis Date  . Angina pectoris   . Arthritis   . Chronic anticoagulation    Xarelto  . Chronic diastolic (congestive) heart failure ([place])   . COPD (chronic obstructive pulmonary disease) ([place])   . Coronary artery disease    a. s/p RCA stent 2008. b. cath 02/2009: 30% mLAD, 90% AV groove cx unchanged from prior, 50-60% RCA, EF 60% -> med rx.  . Depression   . Essential hypertension   . Hyperlipidemia   . Mild pulmonary hypertension ([place]) 09/17/2016  . Permanent atrial fibrillation ([place])   . Skin cancer Jan 2013   squamous cell- removed  . Tricuspid regurgitation 09/17/2016    Family History  Problem Relation Age of Onset  . Stroke Mother   . Heart disease Mother   . Coronary artery disease Father   . Heart disease Father   . Stroke Sister   . Heart disease Brother   . Stroke Sister   . Stroke Sister     Past Surgical History:  Procedure Laterality Date  .  CATARACTS REMOVED    . ABDOMINAL HYSTERECTOMY    . CARDIAC CATHETERIZATION  03/22/2009   Started medical therapy  . CARDIOVERSION  06/05/2009   3 attempts, last attempt successful  . CORONARY ANGIOPLASTY WITH STENT PLACEMENT  2009   RCA Vision stent  . HIP SURGERY    . LOWER EXTREMITY ANGIOGRAPHY Left 08/10/2019   Procedure: Lower Extremity  Angiography;  Surgeon: Katha Cabal, MD;  Location: [facility];  Service: Cardiovascular;  Laterality: Left;  Marland Kitchen MELANOMA EXCISION  06/06/2011   Procedure: MELANOMA EXCISION;  Surgeon: Adin Hector, MD;  Location: [facility];  Service: General;  Laterality: Left;  re excision squamous cell lesion left chest wall    Social History   Occupational History  . Occupation: Retired  Tobacco Use  . Smoking status: Former Smoker    Packs/day: 0.50  . Smokeless tobacco: Never Used  Substance and Sexual Activity  . Alcohol use: No  . Drug use: No  .  Sexual activity: Never

## 2019-10-18 NOTE — Telephone Encounter (Signed)
Telephone call placed to schedule [facility] Palliative visit. Was asked to call back later today and I called back and received voicemail.

## 2019-10-19 ENCOUNTER — Telehealth: Payer: Self-pay

## 2019-10-19 ENCOUNTER — Other Ambulatory Visit: Payer: Self-pay

## 2019-10-19 NOTE — Patient Outreach (Signed)
[facility]) Care Management  10/19/2019  Tiffany Charles 07/21/1929 [id number]   Referral Date: 10/19/19 Referral Source: [facility] Referral Reason: Patient needs PCS services  Spoke with Lattie Haw, RN from [facility].  She states patient primary caregiver Tiffany Charles is due to have brain surgery on 10-25-19 and patient possibly needs respite care as no other family really able to care for patient. She is concerned for patient safety as patient has dementia and needs constant supervision.  Lattie Haw states that family is not opposed to having patient placed temporarily while Tiffany Charles is away.  Advised that CM would outreach son Tiffany Charles.     Outreach Attempt: Telephone call to son Tiffany Charles for referral.  No answer. HIPAA compliant voice message left.    Plan: RN CM will attempt again within 4 business days and send letter   Jone Baseman, RN, MSN [facility] Care Management Coordinator Direct Line [phone] Toll Free: [phone]  Fax: [phone]

## 2019-10-19 NOTE — Telephone Encounter (Signed)
Received a message that son, [name], called stating "mom needs help".Per [facility], APS now allowing communication with son, [name].  Phone call placed to son, [name]. Verbal consent obtained from patient's son. Visit scheduled for 10/20/2019 @ 8:30 am.

## 2019-10-20 ENCOUNTER — Other Ambulatory Visit: Payer: Medicare HMO | Admitting: Internal Medicine

## 2019-10-20 ENCOUNTER — Other Ambulatory Visit: Payer: Self-pay | Admitting: *Deleted

## 2019-10-20 ENCOUNTER — Ambulatory Visit: Payer: Self-pay

## 2019-10-20 ENCOUNTER — Other Ambulatory Visit: Payer: Self-pay

## 2019-10-20 ENCOUNTER — Telehealth: Payer: Self-pay | Admitting: Internal Medicine

## 2019-10-20 DIAGNOSIS — I10 Essential (primary) hypertension: Secondary | ICD-10-CM

## 2019-10-20 DIAGNOSIS — Z515 Encounter for palliative care: Secondary | ICD-10-CM

## 2019-10-20 NOTE — Telephone Encounter (Signed)
0920  Phoned son Faustina Girtz and was able to speak to him.  I informed him that Ms. Rennhack had a palliative care visit scheduled for this AM but no one came to the door at home.  He stated initially that she did not have an appointment, but corrected himself and stated that he had forgotten the appointment.  He stated that she was with him at a restaurant and they were having breakfast.  He stated that he would not be available today due to his own medical appointment. He is scheduled for surgery on 10/25/2019 and will have someone to stay with patient.  Palliative care will make an attempt to reschedule today's appointment.  Phoned [place] to inform her of above status.  She plans on having social worker reach out for an appointment with patient. Gonzella Lex, NP-C

## 2019-10-20 NOTE — Patient Outreach (Signed)
Received call from [place] NP. States home visit was scheduled for this morning. However, member and son Tiffany Charles were out eating breakfast.  Writer made aware member's son Tiffany Charles is scheduled for brain surgery on June 1st. Tiffany Charles is Tiffany Charles's primary caregiver. [facility] palliative NP expresses concerns regarding member's safety and caregiver status when Tiffany Charles has surgery next week. Requests that writer makes [facility] LCSW referral.   Discussed that [facility] RN and [facility] Care Management RNCM are also active.   Collaboration with [facility]. Discussed that writer will make referral to [facility] LCSW for level of care concerns.  [name], MSN-Ed, RN,BSN [facility] Acute Care Coordinator [phone] [facility]) [phone]  (Toll free office)

## 2019-10-20 NOTE — Patient Outreach (Signed)
Tiffany Charles) Care Management  10/20/2019  Tiffany Charles December 18, 1929 [id number]   Referral Date: 10/19/19 Referral Source: [facility] Referral Reason: Patient needs PCS services  Spoke with son Tiffany Charles.  Discussed reason for referral.  He states he has a little time to talk as he has his pre-op appointment today.  He states that they missed the palliative care nurse visit today as they went out to breakfast.  He states he forgot about the appointment.  He states that he is scheduled for surgery on 10-25-19 for a brain tumor and will not be able to be there to care for his mother.  He states that his brother Tiffany Charles was supposed to come today but did not show up. He states that he is not dependable.  He states that her Tiffany Charles who is the guardian does not do anything and will not answer his phone.  He does state that he has a young lady named Tiffany Charles that is willing to help and he is paying her $10.00/hour to help out and he is going to try to get her to stay.  He states that patient has dementia and needs supervision and  help with meals, bathing, dressing and medication administration. He states that he typically administers medications and patient has no problems with affording medications.  He states patient is continent most of the time but still wears a depends.  Patient fell recently and fractured her right humerus and patient is in a sling right now.  So she needs more assistance with care.    Expressed to Tiffany Charles the concern of Tiffany Charles the [facility] nurse when he has surgery. He verbalized understanding but states he has got to have surgery and will be down for a while as he will be going to rehab after the surgery if all goes well.  He states he is agreeable to [facility] help with finding possible resources but patient only gets $1100.00/month.  Patient does not have medicaid.  Patient was discharged from [facility] on [facility] to go with son Tiffany Charles at [employer] but son Tiffany Charles left patient  at the home with son Tiffany Charles who has been taking care of the patient since then.  Recent APS referral was closed.    Spoke with Tiffany Charles, [place] Nurse to advise her of conversation with [facility].  We both have concerns for patient care once Don leaves for surgery but just maybe her can get the caregiver situation taken care of before then.  Tiffany Charles plans to visit on Monday and someone else from [facility] to visit possibly Tuesday.  Palliative care does not have a respite program.    Spoke with Tiffany Rolling, RN Post Acute Care Coordinator.  Discussed case as she got a call from palliative care as family missed the appointment this morning and palliative care NP expressed concern for patient care as well. She will be making a referral to social work for level of care needs.  Plan: RN CM will plan to collaborate with social work as needed and outreach next week.    Tiffany Baseman, RN, MSN [place] Management Care Management Coordinator Direct Line [phone] Cell [phone] Toll Free: [phone]  Fax: [phone]

## 2019-10-21 ENCOUNTER — Other Ambulatory Visit: Payer: Self-pay

## 2019-10-21 NOTE — Patient Outreach (Signed)
Assigned to Eduard Clos LCSW for social work.

## 2019-10-21 NOTE — Patient Outreach (Addendum)
Tiffany Charles Tiffany Charles [facility]) Care Management  [place]  10/21/2019   Tiffany Charles 1930-05-17 [id number]  Subjective: Telephone call to son Tiffany Charles to follow up.  He states that he is still preparing for surgery on Tuesday. He states he has Tiffany Charles who has agreed to stay with patient while he is gone. He states that he will be gone about 4-5 days at [facility] for surgery and that a friend will be taking him. He states he will be doing rehab at home and will still have Tiffany Charles helping.  He states he has plenty of food in the home and that Tiffany Charles is doing a great job taking care of his mom.  He states he has instructed her on medications and medication schedule. He states that his mom's swelling to her legs is going down as she has been taking the fluid pills.  Discussed heart failure.  He states he does not weigh her daily due to dementia but is monitoring for any other signs such as shortness of breath and swelling.  He states his brother still has not showed up despite him asking for him to be there to assist Tiffany Charles but he feels confident that Tiffany Charles will be able to take care of patient as she just needs supervision, meals, assist with activities of daily living, and medications.    Objective:   Encounter Medications:  Outpatient Encounter Medications as of 10/21/2019  Medication Sig  . acetaminophen (TYLENOL) 325 MG tablet Take 2 tablets (650 mg total) by mouth every 4 (four) hours as needed for fever.  Marland Kitchen acitretin (SORIATANE) 25 MG capsule Take 25 mg by mouth daily.  Marland Kitchen ascorbic acid (VITAMIN C) 500 MG tablet Take 500 mg by mouth daily.  Marland Kitchen aspirin EC 81 MG tablet Take 81 mg by mouth daily.   Marland Kitchen atorvastatin (LIPITOR) 20 MG tablet Take 1 tablet (20 mg total) by mouth daily at 6 PM.  . cholecalciferol (VITAMIN D3) 25 MCG (1000 UNIT) tablet Take 1,000 Units by mouth daily.  . folic acid (FOLVITE) 1 MG tablet Take 1 tablet (1 mg total) by mouth daily.  Marland Kitchen HYDROcodone-acetaminophen  (NORCO/VICODIN) 5-325 MG tablet Take 1 tablet by mouth every 6 (six) hours as needed for moderate pain or severe pain.  Marland Kitchen lactulose (CHRONULAC) 10 GM/15ML solution Take 15 mLs (10 g total) by mouth 2 (two) times daily as needed for mild constipation.  Marland Kitchen LORazepam (ATIVAN) 1 MG tablet Take 1 tablet (1 mg total) by mouth every 6 (six) hours as needed for anxiety.  . metoprolol tartrate (LOPRESSOR) 50 MG tablet Take 1 tablet (50 mg total) by mouth 2 (two) times daily.  . pantoprazole (PROTONIX) 40 MG tablet Take 1 tablet (40 mg total) by mouth 2 (two) times daily.  . polyethylene glycol (MIRALAX / GLYCOLAX) 17 g packet Take 17 g by mouth daily as needed for moderate constipation.  . tamsulosin (FLOMAX) 0.4 MG CAPS capsule Take 1 capsule (0.4 mg total) by mouth daily.  Marland Kitchen torsemide (DEMADEX) 20 MG tablet Take 1 tablet (20 mg total) by mouth daily.  . vitamin B-12 (CYANOCOBALAMIN) 1000 MCG tablet Take 1,000 mcg by mouth daily.   No facility-administered encounter medications on file as of 10/21/2019.    Functional Status:  In your present state of health, do you have any difficulty performing the following activities: 10/21/2019  Hearing? N  Vision? N  Difficulty concentrating or making decisions? Y  Comment dementia  Walking or climbing stairs? Y  Comment  unsteady gait  Dressing or bathing? Y  Comment dementia  Doing errands, shopping? Y  Comment dementia-son Don Engineer, building services and eating ? Y  Comment dementia  Using the Toilet? Y  Comment incontinent at times  In the past six months, have you accidently leaked urine? Y  Comment sometimes wets a night  Do you have problems with loss of bowel control? N  Managing your Medications? Y  Comment son Tiffany Charles does  Managing your Finances? Y  Comment son Tiffany Charles does  Runner, broadcasting/film/video? Y  Comment son Tiffany Charles does  Some recent data might be hidden    Fall/Depression Screening: Fall Risk  10/21/2019 10/12/2019 06/30/2018   Falls in the past year? 1 1 0  Number falls in past yr: 1 1 -  Injury with Fall? 1 0 -  Risk for fall due to : Impaired mobility;Mental status change Impaired mobility;Impaired vision;Impaired balance/gait;History of fall(s);Mental status change -  Follow up Falls prevention discussed;Education provided Falls evaluation completed -   PHQ 2/9 Scores 10/21/2019 10/20/2019 06/30/2018 04/29/2017 12/28/2014 11/07/2013  PHQ - 2 Score 0 0 0 0 0 2  PHQ- 9 Score - - 4 - - 9   SDOH Screenings   Alcohol Screen:   . Last Alcohol Screening Score (AUDIT):   Depression (PHQ2-9): Low Risk   . PHQ-2 Score: 0  Financial Resource Strain:   . Difficulty of Paying Living Expenses:   Food Insecurity: No Food Insecurity  . Worried About Charity fundraiser in the Last Year: Never true  . Ran Out of Food in the Last Year: Never true  Housing: Low Risk   . Last Housing Risk Score: 0  Physical Activity:   . Days of Exercise per Week:   . Minutes of Exercise per Session:   Social Connections:   . Frequency of Communication with Friends and Family:   . Frequency of Social Gatherings with Friends and Family:   . Attends Religious Services:   . Active Member of Clubs or Organizations:   . Attends Archivist Meetings:   Marland Kitchen Marital Status:   Stress:   . Feeling of Stress :   Tobacco Use: Medium Risk  . Smoking Tobacco Use: Former Smoker  . Smokeless Tobacco Use: Never Used  Transportation Needs: No Transportation Needs  . Lack of Transportation (Medical): No  . Lack of Transportation (Non-Medical): No     Assessment: Patient with recent fall and fractured right humerus. Patient to see ortho again on 11-08-2019. Patient currently in a sling.  Leg swelling better per son.  Son who is the primary caregiver to have surgery on 10-25-19 and will be inpatient 4-5 days per son. Care set up for patient currently.   Plan:  [facility] CM Care Plan Problem One     Most Recent Value  Care Plan Problem One  History of  Heart Failure-Multiple ED/hospital visits  Role Documenting the Problem One  Care Management Telephonic Coordinator  Care Plan for Problem One  Active  THN Long Term Goal   Patient will not experience unplanned hospital readmission within the next 31 days  THN Long Term Goal Start Date  10/21/19  Interventions for Problem One Long Term Goal  Discussed with caregiver current clinical condition since hospital discharge, confirmed that patient has all medications and denies concerns around medications, reviewed post-hospital discharge instructions with patient and confirmed that patient has reliable transportation  [facility] CM Short Term Goal #1  Patient will not experience exacerbation of heart failure within 30 days.  THN CM Short Term Goal #1 Start Date  10/21/19  Interventions for Short Term Goal #1  Discussed with son heart failure symptoms and heart failure care such as weights, monitoring for swelling and shortness of breath.  THN CM Short Term Goal #2   Patient will follow up with orthopedic as scheduled within 21 days.  THN CM Short Term Goal #2 Start Date  10/21/19  Interventions for Short Term Goal #2  Discussed follow up with orthopedic for fractured right humerus with son.  Son to transport.     RN CM will provide ongoing education and support to caregiver/patient through phone calls.   RN CM will send welcome packet with consent to patient.   RN CM will send initial barriers letter, assessment, and care plan to primary care physician.   RN CM will contact caregiver/patient next week and caregiver/patient agrees to next contact.    Jone Baseman, RN, MSN [place] Management Care Management Coordinator Direct Line [phone] Cell [phone] Toll Free: [phone]  Fax: [phone]

## 2019-10-25 ENCOUNTER — Encounter: Payer: Self-pay | Admitting: *Deleted

## 2019-10-25 ENCOUNTER — Ambulatory Visit: Payer: Self-pay

## 2019-10-25 ENCOUNTER — Other Ambulatory Visit: Payer: Self-pay | Admitting: *Deleted

## 2019-10-25 NOTE — Patient Outreach (Signed)
[place] [facility]) Care Management  10/25/2019  Tiffany Charles 07-27-1929 [id number]   CSW received referral on Friday, 10/21/2019 and attempted to reach pt/son today. CSW left a HIPPA compliant voice message and will send an Unsuccessful outreach letter with plans to attempt outreach again in 3-4 business days.  CSW was able to collaborate with Valente David, RN, [facility], who shared that the son has made arrangements for caregiver support for pt while he is hospitalized and recovering.   CSW will await callback as well as plan for an outreach attempt again later this week.   Eduard Clos, MSW, [place] Worker  [place] [phone]

## 2019-10-27 ENCOUNTER — Other Ambulatory Visit: Payer: Self-pay | Admitting: *Deleted

## 2019-10-27 ENCOUNTER — Ambulatory Visit: Payer: Self-pay | Admitting: *Deleted

## 2019-10-27 NOTE — Patient Outreach (Signed)
[place] [facility]) Care Management  10/27/2019  Tiffany Charles 1929/09/11 [id number]   CSW attempted initial outreach call attempt #2 today to contact pt/family and was unsuccessful. CSW will attempt outreach again in 3-4 business days if no return call is received.   Eduard Clos, MSW, [place] Worker  [place] [phone]

## 2019-10-28 ENCOUNTER — Other Ambulatory Visit: Payer: Self-pay

## 2019-10-28 NOTE — Patient Outreach (Addendum)
[place] [facility]) Care Management  10/28/2019  Tiffany Charles Sep 17, 1929 [id number]   Telephone call to patient's son [name].  No answer. HIPAA compliant voice message left.  Telephone call to patient's home.  No answer.  Unable to leave a message.    Spoke with [facility] Nurse Lattie Haw. She states she saw patient this week and she was doing good. She adds that patient has a caregiver there while [name] is gone for surgery.  She had no concerns on her visit.     Update:  Incoming call from son [name]. He states that he is doing ok from his surgery and will possibly go home on Saturday.  He states that [name] remains in the home with his mother right now.  He states when he goes home he still will need some assist in the home and really cannot afford to pay privately. Advised him that [place] will be outreaching next week and she may be able to assist with that.  He verbalized understanding and appreciative of trying to help.    Plan: RN CM will plan to outreach again in the month of June.    Jone Baseman, RN, MSN [place] Management Care Management Coordinator Direct Line [phone] Cell [phone] Toll Free: [phone]  Fax: [phone]

## 2019-11-01 ENCOUNTER — Telehealth: Payer: Self-pay

## 2019-11-01 ENCOUNTER — Other Ambulatory Visit: Payer: Self-pay | Admitting: *Deleted

## 2019-11-01 ENCOUNTER — Other Ambulatory Visit: Payer: Self-pay

## 2019-11-01 DIAGNOSIS — F0280 Dementia in other diseases classified elsewhere without behavioral disturbance: Secondary | ICD-10-CM | POA: Diagnosis not present

## 2019-11-01 MED ORDER — TRAMADOL HCL 50 MG PO TABS: 50.0000 mg | ORAL_TABLET | Freq: Two times a day (BID) | ORAL | 0 refills | Status: DC | PRN

## 2019-11-01 NOTE — Patient Outreach (Signed)
[name]  11/01/2019  Tiffany Charles [birth year] [id number]  CSW was able to make contact with pt's son, [name], today who reports he is home post "12hour brain surgery" and doing well.  Per son,it would be helpful to have some caregiver support with caring for his mom so he can go to grocery store, run errands, yard work, Social research officer, government.  Currently, he has a brother at the home with pt/mom and also has had additional in home family support.   CSW offered to mail them a list of private duty caregiver agencies.  Son is not certain if they can afford to pay out of pocket.  CSW reminded him she may have some sort of [employer] benefit that helps cover some in home help; CSW will investigate and call son back with more info.   Eduard Clos, MSW, [place] Worker  [place] [phone]

## 2019-11-01 NOTE — Telephone Encounter (Signed)
Patient would like a Rx for pain.  CB# [phone].  Please advise.  Thank you.

## 2019-11-01 NOTE — Telephone Encounter (Signed)
Called into pharmacy

## 2019-11-01 NOTE — Telephone Encounter (Signed)
Ultram one po bid prn pain # 20

## 2019-11-01 NOTE — Telephone Encounter (Signed)
Please advise 

## 2019-11-03 ENCOUNTER — Telehealth: Payer: Self-pay

## 2019-11-03 DIAGNOSIS — E43 Unspecified severe protein-calorie malnutrition: Secondary | ICD-10-CM | POA: Diagnosis not present

## 2019-11-03 DIAGNOSIS — R7401 Elevation of levels of liver transaminase levels: Secondary | ICD-10-CM | POA: Diagnosis not present

## 2019-11-03 DIAGNOSIS — I255 Ischemic cardiomyopathy: Secondary | ICD-10-CM | POA: Diagnosis not present

## 2019-11-03 DIAGNOSIS — I4891 Unspecified atrial fibrillation: Secondary | ICD-10-CM | POA: Diagnosis not present

## 2019-11-03 DIAGNOSIS — K7200 Acute and subacute hepatic failure without coma: Secondary | ICD-10-CM | POA: Diagnosis not present

## 2019-11-03 NOTE — Telephone Encounter (Signed)
Phone call placed to patient to check in and offer to reschedule visit with Palliative care. VM left with call back information.

## 2019-11-07 DIAGNOSIS — I255 Ischemic cardiomyopathy: Secondary | ICD-10-CM | POA: Diagnosis not present

## 2019-11-07 DIAGNOSIS — K7200 Acute and subacute hepatic failure without coma: Secondary | ICD-10-CM | POA: Diagnosis not present

## 2019-11-08 ENCOUNTER — Telehealth: Payer: Self-pay

## 2019-11-08 ENCOUNTER — Ambulatory Visit: Payer: Medicare HMO | Admitting: Orthopaedic Surgery

## 2019-11-08 NOTE — Telephone Encounter (Signed)
Late entry.   Spoke to pt son after 5pm on 10/31/2019. Pt son is asking for a refill of hydrocodone for pt. Recent ER visit after right humerus break and right elbow contusion. Pt son was loud on the phone while requesting the refill. Informed son that rx is prescribed by PCP and we can send request to fill medication. Informed son to contact the ortho office that pt has been seeing. Pt son stated that he did not know who that was. Gave son the information for [facility] on [address]. Pt son stated understanding and will reach out to them for refill.

## 2019-11-08 NOTE — Telephone Encounter (Signed)
-----   Message from Billy Coast sent at 10/31/2019  5:05 PM EDT ----- Phone note rx Orpah Greek

## 2019-11-09 ENCOUNTER — Other Ambulatory Visit: Payer: Self-pay | Admitting: *Deleted

## 2019-11-09 NOTE — Patient Outreach (Signed)
[place] [facility]) Care Management  11/09/2019  Tiffany Charles 1929/06/27 [id number]   CSW spoke briefly with pt's son who reports he is currently at [facility].  CSW attempted to reach pt by phone but when I asked for pt the phone was hung up.  CSW will attempt to reach out again with both son and pt in the next 10 days.    Eduard Clos, MSW, [place] Worker  [place] [phone]

## 2019-11-11 ENCOUNTER — Other Ambulatory Visit: Payer: Self-pay

## 2019-11-11 NOTE — Patient Outreach (Signed)
[facility] [place]) Care Management  [place]  11/11/2019   [facility] STAY [birth year] [id number]  Subjective: Telephone call to son Tiffany Charles. He states he got home last night from [place].  He states that his mom is doing good and just had breakfast.  He states that Tiffany Charles is still there to assist as he cannot do too much since his surgery.  CM advised him that palliative care has been trying to call and advised him to check his messages.  He verbalized understanding and denies any questions or concerns.    Objective:   Encounter Medications:  Outpatient Encounter Medications as of 11/11/2019  Medication Sig  . acetaminophen (TYLENOL) 325 MG tablet Take 2 tablets (650 mg total) by mouth every 4 (four) hours as needed for fever.  Marland Kitchen acitretin (SORIATANE) 25 MG capsule Take 25 mg by mouth daily.  Marland Kitchen ascorbic acid (VITAMIN C) 500 MG tablet Take 500 mg by mouth daily.  Marland Kitchen aspirin EC 81 MG tablet Take 81 mg by mouth daily.   Marland Kitchen atorvastatin (LIPITOR) 20 MG tablet Take 1 tablet (20 mg total) by mouth daily at 6 PM.  . cholecalciferol (VITAMIN D3) 25 MCG (1000 UNIT) tablet Take 1,000 Units by mouth daily.  . folic acid (FOLVITE) 1 MG tablet Take 1 tablet (1 mg total) by mouth daily.  Marland Kitchen HYDROcodone-acetaminophen (NORCO/VICODIN) 5-325 MG tablet Take 1 tablet by mouth every 6 (six) hours as needed for moderate pain or severe pain.  Marland Kitchen lactulose (CHRONULAC) 10 GM/15ML solution Take 15 mLs (10 g total) by mouth 2 (two) times daily as needed for mild constipation.  Marland Kitchen LORazepam (ATIVAN) 1 MG tablet Take 1 tablet (1 mg total) by mouth every 6 (six) hours as needed for anxiety.  . metoprolol tartrate (LOPRESSOR) 50 MG tablet Take 1 tablet (50 mg total) by mouth 2 (two) times daily.  . pantoprazole (PROTONIX) 40 MG tablet Take 1 tablet (40 mg total) by mouth 2 (two) times daily.  . polyethylene glycol (MIRALAX / GLYCOLAX) 17 g packet Take 17 g by mouth daily as needed for moderate constipation.  .  tamsulosin (FLOMAX) 0.4 MG CAPS capsule Take 1 capsule (0.4 mg total) by mouth daily.  Marland Kitchen torsemide (DEMADEX) 20 MG tablet Take 1 tablet (20 mg total) by mouth daily.  . traMADol (ULTRAM) 50 MG tablet Take 1 tablet (50 mg total) by mouth 2 (two) times daily as needed.  . vitamin B-12 (CYANOCOBALAMIN) 1000 MCG tablet Take 1,000 mcg by mouth daily.   No facility-administered encounter medications on file as of 11/11/2019.    Functional Status:  In your present state of health, do you have any difficulty performing the following activities: 10/21/2019  Hearing? N  Vision? N  Difficulty concentrating or making decisions? Y  Comment dementia  Walking or climbing stairs? Y  Comment unsteady gait  Dressing or bathing? Y  Comment dementia  Doing errands, shopping? Y  Comment dementia-son Tiffany Charles, building services and eating ? Y  Comment dementia  Using the Toilet? Y  Comment incontinent at times  In the past six months, have you accidently leaked urine? Y  Comment sometimes wets a night  Do you have problems with loss of bowel control? N  Managing your Medications? Y  Comment son Tiffany Charles does  Managing your Finances? Y  Comment son Tiffany Charles does  Runner, broadcasting/film/video? Y  Comment son Tiffany Charles does  Some recent data might be hidden    Fall/Depression  Screening: Fall Risk  10/21/2019 10/12/2019 06/30/2018  Falls in the past year? 1 1 0  Number falls in past yr: 1 1 -  Injury with Fall? 1 0 -  Risk for fall due to : Impaired mobility;Mental status change Impaired mobility;Impaired vision;Impaired balance/gait;History of fall(s);Mental status change -  Follow up Falls prevention discussed;Education provided Falls evaluation completed -   PHQ 2/9 Scores 10/21/2019 10/20/2019 06/30/2018 04/29/2017 12/28/2014 11/07/2013  PHQ - 2 Score 0 0 0 0 0 2  PHQ- 9 Score - - 4 - - 9    Assessment: Patient continues recovery at home.  Primary caregiver Tiffany Charles now home from [place] after surgery.  Help in  the home continues.    Plan:  [facility] CM Care Plan Problem One     Most Recent Value  Care Plan Problem One History of Heart Failure-Multiple ED/Charles visits  Role Documenting the Problem One Care Management Telephonic Coordinator  Care Plan for Problem One Active  THN Long Term Goal  Patient will not experience unplanned Charles readmission within the next 31 days  THN Long Term Goal Start Date 10/21/19  Interventions for Problem One Long Term Goal Reviewed with son heart failure.  Advised son that palliative care had been attempting to call and advised him to call back.    THN CM Short Term Goal #1  Patient will not experience exacerbation of heart failure within 30 days.  THN CM Short Term Goal #1 Start Date 10/21/19  Interventions for Short Term Goal #1 Discussed continuing to monitor for signs of heart failure such as swelling and increased shortness of breath.    THN CM Short Term Goal #2  Patient will follow up with orthopedic as scheduled within 21 days.  THN CM Short Term Goal #2 Start Date 10/21/19  Interventions for Short Term Goal #2 Appointment rescheduled.     RN CM will contact again this month and son agreeable.    Jone Baseman, RN, MSN [place] Management Care Management Coordinator Direct Line [phone] Cell [phone] Toll Free: [phone]  Fax: [phone]

## 2019-11-15 ENCOUNTER — Telehealth: Payer: Self-pay | Admitting: Internal Medicine

## 2019-11-15 NOTE — Telephone Encounter (Signed)
Called patient's son, [name], to schedule the Palliative Consult, no answer and mailbox was full.  I then called patient's grandson, Jessel Gettinger, and he said that they were in court right now to decide on guardianship for the patient.  I asked if he had a number for someone in the court system for me to contact to find out who I need to call to schedule a visit and he said that he was on an airplane at the moment and he would call me back after he lands.

## 2019-11-17 ENCOUNTER — Telehealth: Payer: Self-pay | Admitting: Internal Medicine

## 2019-11-17 NOTE — Telephone Encounter (Signed)
Spoke with patient's grandson, Lashonda Sonneborn, about scheduling Palliative consult, he stated that his Dad, Milli Woolridge, was just awarded Guardianship over the patient yesterday.  He said [place]  I told grandson that I would f/u with Shanon Brow next week to find out the status of her living situation and he was in agreement with this.

## 2019-11-18 ENCOUNTER — Other Ambulatory Visit: Payer: Self-pay | Admitting: *Deleted

## 2019-11-18 NOTE — Patient Outreach (Signed)
[place] [facility]) Care Management  11/18/2019  Tiffany Charles 08/17/1929 [id number]   CSW made contact with pt's son who states he is recovering well from his surgery and his mom is doing ok. Per son, he has adequate in home support for his mom and does not need any additional care support for her.  [name] is driving some and plans to take pt to her next MD appointment which is 11/23/2019 at Ortho.  "she won't wear her sling like she's supposed to".   CSW reminded son to call if needs arise.  CSW plans to sign off at this time and advise PCP and [facility] team of above.    Eduard Clos, MSW, [place] Worker  [place] [phone]

## 2019-11-22 ENCOUNTER — Other Ambulatory Visit: Payer: Self-pay

## 2019-11-22 NOTE — Patient Outreach (Signed)
[place] [facility]) Care Management  [employer]  11/22/2019   Tiffany Charles 02-Oct-1929 [id number]  Subjective: Telephone call to son Tiffany Charles.  He states that patient is doing good.  He states that they are out to breakfast as he had her ortho appointment date mixed.  He states appointment is tomorrow. He states patient is using her right arm some and still not really wearing sling as ordered as patient takes it off. He states that patient is doing well and that he continues to take care of patient.  He states his brother has not helped in patient care.  He continued to have [name] to help as he is continuing to recover from brain surgery. He denies any issues with medications.  Discussed continue regimen and heart failure.  He denies any needs at this time.    Objective:   Encounter Medications:  Outpatient Encounter Medications as of 11/22/2019  Medication Sig  . acetaminophen (TYLENOL) 325 MG tablet Take 2 tablets (650 mg total) by mouth every 4 (four) hours as needed for fever.  Marland Kitchen acitretin (SORIATANE) 25 MG capsule Take 25 mg by mouth daily.  Marland Kitchen ascorbic acid (VITAMIN C) 500 MG tablet Take 500 mg by mouth daily.  Marland Kitchen aspirin EC 81 MG tablet Take 81 mg by mouth daily.   Marland Kitchen atorvastatin (LIPITOR) 20 MG tablet Take 1 tablet (20 mg total) by mouth daily at 6 PM.  . cholecalciferol (VITAMIN D3) 25 MCG (1000 UNIT) tablet Take 1,000 Units by mouth daily.  . folic acid (FOLVITE) 1 MG tablet Take 1 tablet (1 mg total) by mouth daily.  Marland Kitchen HYDROcodone-acetaminophen (NORCO/VICODIN) 5-325 MG tablet Take 1 tablet by mouth every 6 (six) hours as needed for moderate pain or severe pain.  Marland Kitchen lactulose (CHRONULAC) 10 GM/15ML solution Take 15 mLs (10 g total) by mouth 2 (two) times daily as needed for mild constipation.  Marland Kitchen LORazepam (ATIVAN) 1 MG tablet Take 1 tablet (1 mg total) by mouth every 6 (six) hours as needed for anxiety.  . metoprolol tartrate (LOPRESSOR) 50 MG tablet Take 1 tablet (50  mg total) by mouth 2 (two) times daily.  . pantoprazole (PROTONIX) 40 MG tablet Take 1 tablet (40 mg total) by mouth 2 (two) times daily.  . polyethylene glycol (MIRALAX / GLYCOLAX) 17 g packet Take 17 g by mouth daily as needed for moderate constipation.  . tamsulosin (FLOMAX) 0.4 MG CAPS capsule Take 1 capsule (0.4 mg total) by mouth daily.  Marland Kitchen torsemide (DEMADEX) 20 MG tablet Take 1 tablet (20 mg total) by mouth daily.  . traMADol (ULTRAM) 50 MG tablet Take 1 tablet (50 mg total) by mouth 2 (two) times daily as needed.  . vitamin B-12 (CYANOCOBALAMIN) 1000 MCG tablet Take 1,000 mcg by mouth daily.   No facility-administered encounter medications on file as of 11/22/2019.    Functional Status:  In your present state of health, do you have any difficulty performing the following activities: 11/22/2019 10/21/2019  Hearing? N N  Vision? N N  Difficulty concentrating or making decisions? Y Y  Comment dementia dementia  Walking or climbing stairs? Y Y  Comment unsteady gait unsteady gait  Dressing or bathing? Y Y  Comment dementia dementia  Doing errands, shopping? Y Y  Comment dementia-son Tiffany Charles Systems developer and eating ? Y Y  Comment dementia dementia  Using the Toilet? Y Y  Comment incontinent at times incontinent at times  In the past  six months, have you accidently leaked urine? Y Y  Comment sometimes wets at night sometimes wets a night  Do you have problems with loss of bowel control? N N  Managing your Medications? Tempie Donning  Comment son Tiffany Charles does son Tiffany Charles does  Managing your Finances? Y Y  Comment son-Tiffany Charles does son Tiffany Charles does  Runner, broadcasting/film/video? Y Y  Comment Son- Tiffany Charles does son Tiffany Charles does  Some recent data might be hidden    Fall/Depression Screening: Fall Risk  11/22/2019 10/21/2019 10/12/2019  Falls in the past year? _0 Number falls in past yr: _1 Injury with Fall? 1 1 0  Risk for fall due to : Mental status  change;Impaired mobility Impaired mobility;Mental status change Impaired mobility;Impaired vision;Impaired balance/gait;History of fall(s);Mental status change  Follow up Falls prevention discussed Falls prevention discussed;Education provided Falls evaluation completed   PHQ 2/9 Scores 11/22/2019 10/21/2019 10/20/2019 06/30/2018 04/29/2017 12/28/2014 11/07/2013  PHQ - 2 Score 0 0 0 0 0 0 2  PHQ- 9 Score - - - 4 - - 9    Assessment: Patient chronic condition continued to be managed with assistance of son Tiffany Charles.   Plan:  [facility] CM Care Plan Problem One     Most Recent Value  Care Plan Problem One History of Heart Failure-Multiple ED/hospital visits  Role Documenting the Problem One Care Management Telephonic Coordinator  Care Plan for Problem One Active  [facility] Long Term Goal  Patient will not experience unplanned hospital readmission within the next 31 days  [facility]N Long Term Goal Start Date 10/21/19  [facility] Long Term Goal Met Date 11/22/19  [facility] CM Short Term Goal #1  Patient will not experience exacerbation of heart failure within 30 days.  THN CM Short Term Goal #1 Start Date 11/22/19  Interventions for Short Term Goal #1 Patient in green zone.  Discussed signs of heart failure and when to notify physician.   THN CM Short Term Goal #2  Patient will follow up with orthopedic as scheduled within 21 days.  [facility]N CM Short Term Goal #2 Start Date 11/22/19  Interventions for Short Term Goal #2 Appointment 11/23/19     RN CM will contact again in the month of July and son agreeable.   Jone Baseman, RN, MSN [place] Management Care Management Coordinator Direct Line [phone] Cell [phone] Toll Free: [phone]  Fax: [phone]

## 2019-11-23 ENCOUNTER — Ambulatory Visit (INDEPENDENT_AMBULATORY_CARE_PROVIDER_SITE_OTHER): Payer: Medicare HMO

## 2019-11-23 ENCOUNTER — Ambulatory Visit: Payer: Medicare HMO | Admitting: Orthopaedic Surgery

## 2019-11-23 ENCOUNTER — Telehealth: Payer: Self-pay | Admitting: Internal Medicine

## 2019-11-23 ENCOUNTER — Encounter: Payer: Self-pay | Admitting: Orthopaedic Surgery

## 2019-11-23 VITALS — BP 104/66 | HR 76

## 2019-11-23 DIAGNOSIS — S42201A Unspecified fracture of upper end of right humerus, initial encounter for closed fracture: Secondary | ICD-10-CM

## 2019-11-23 MED ORDER — TRAMADOL HCL 50 MG PO TABS: 50.0000 mg | ORAL_TABLET | Freq: Two times a day (BID) | ORAL | 0 refills | Status: DC | PRN

## 2019-11-23 MED ORDER — TRAMADOL HCL 50 MG PO TABS: 50.0000 mg | ORAL_TABLET | Freq: Four times a day (QID) | ORAL | 0 refills | Status: AC | PRN

## 2019-11-23 NOTE — Telephone Encounter (Signed)
Called patient's son, Shanon Brow (legal guardian) to discuss Palliative services with him and to find out the status of the patient's living situation [place] no answer.  Left message with my name and contact number requesting a return call.

## 2019-11-23 NOTE — Progress Notes (Signed)
Office Visit Note   Patient: Tiffany Charles           Date of Birth: [birth year]           MRN: [id number] Visit Date: 11/23/2019              Requested by: [name], MD [address] [place],  [place] [address] PCP: [name], MD   Assessment & Plan: Visit Diagnoses:  1. Closed fracture of proximal end of right humerus, unspecified fracture morphology, initial encounter     Plan: We reviewed x-rays with family.  She has been noncompliant wearing the sling due to her dementia.  Continue conservative treatment we will recheck her in 1 month.  Proximal and distal fragment appear to move in 1 piece with rotation flexion and extension.  Repeat x-rays on return in 1 month.  Follow-Up Instructions: Return in about 1 month (around 12/23/2019).   Orders:  Orders Placed This Encounter  Procedures   XR Shoulder 1V Right   Meds ordered this encounter  Medications   DISCONTD: traMADol (ULTRAM) 50 MG tablet    Sig: Take 1 tablet (50 mg total) by mouth every 12 (twelve) hours as needed.    Dispense:  40 tablet    Refill:  0   traMADol (ULTRAM) 50 MG tablet    Sig: Take 1 tablet (50 mg total) by mouth every 6 (six) hours as needed.    Dispense:  30 tablet    Refill:  0      Procedures: No procedures performed   Clinical Data: No additional findings.   Subjective: Chief Complaint  Patient presents with   Right Shoulder - Follow-up    HPI patient returns here with family who she lives with.  They state patient will not keep her sling on with her Alzheimer's continues to remove the sling as soon as they reapply it.  She is complained of pain in her shoulder Norco worked better Ultram the only took 1 a day as recommended by the nurse although I did order that twice a day. Review of Systems Updated unchanged.  Positive heart failure previous CVA ischemic cardiomyopathy, MI, atrial fib.  Objective: Vital Signs: BP 104/66    Pulse 76   Physical  Exam Constitutional:      Appearance: She is well-developed.  HENT:     Head: Normocephalic.     Right Ear: External ear normal.     Left Ear: External ear normal.  Eyes:     Pupils: Pupils are equal, round, and reactive to light.  Neck:     Thyroid: No thyromegaly.     Trachea: No tracheal deviation.  Cardiovascular:     Rate and Rhythm: Normal rate.  Pulmonary:     Effort: Pulmonary effort is normal.  Abdominal:     Palpations: Abdomen is soft.  Skin:    General: Skin is warm and dry.  Neurological:     Mental Status: She is alert and oriented to person, place, and time.  Psychiatric:     Comments: Pleasant confused smiling.     Ortho Exam proximal distal fragment right shoulder moves in 1 piece.  Specialty Comments:  No specialty comments available.  Imaging: XR Shoulder 1V Right  Result Date: 11/23/2019 2 view x-rays right shoulder obtained and reviewed.  Comparison to May 21 images.  Previous minimally angulated fracture now is had significant displacement of the previously minimally angulated right proximal humerus fracture with 90  degree angulation and inferior position of the humeral head to the glenoid. Impression: Loss of position of previous right proximal humerus mildly angulated fracture now with significant angulation.    PMFS History: Patient Active Problem List   Diagnosis Date Noted   Proximal humerus fracture 10/18/2019   NSTEMI (non-ST elevated myocardial infarction) ([place]) 09/22/2019   Ischemic cardiomyopathy 09/22/2019   PVD (peripheral vascular disease) ([place]) 09/22/2019   Stage I decubitus ulcer and pressure area 09/22/2019   Protein-calorie malnutrition, severe 09/16/2019   Palliative care by specialist    Hemorrhagic shock ([place]) 09/01/2019   DNR (do not resuscitate) 06/08/2019   Occipital stroke ([place]) 06/07/2019   Squamous cell carcinoma, leg, unspecified laterality 06/08/2018   Basal cell carcinoma (BCC) of nasal tip 06/08/2018    Senile dementia without behavioral disturbance ([place]) 04/29/2017   Frequent PVCs 09/17/2016   Tricuspid regurgitation 09/17/2016   Mild pulmonary hypertension ([place]) 09/17/2016   Chronic diastolic (congestive) heart failure ([place])    Essential hypertension    Generalized OA 12/28/2014   Goals of care, counseling/discussion 11/09/2013   Depression, major ([place]) 11/07/2013   Permanent atrial fibrillation 10/28/2012   COPD - PFTs in June 2014 with MET test 10/28/2012   CAD, RCA BMS '09, 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 low risk Myoview Feb 2013 10/28/2012   Past Medical History:  Diagnosis Date   Angina pectoris    Arthritis    Chronic anticoagulation    Xarelto   Chronic diastolic (congestive) heart failure (HCC)    COPD (chronic obstructive pulmonary disease) (HCC)    Coronary artery disease    a. s/p RCA stent 2008. b. cath 02/2009: 30% mLAD, 90% AV groove cx unchanged from prior, 50-60% RCA, EF 60% -> med rx.   Depression    Essential hypertension    Hyperlipidemia    Mild pulmonary hypertension ([place]) 09/17/2016   Permanent atrial fibrillation ([place])    Skin cancer Jan 2013   squamous cell- removed   Tricuspid regurgitation 09/17/2016    Family History  Problem Relation Age of Onset   Stroke Mother    Heart disease Mother    Coronary artery disease Father    Heart disease Father    Stroke Sister    Heart disease Brother    Stroke Sister    Stroke Sister     Past Surgical History:  Procedure Laterality Date    CATARACTS REMOVED     ABDOMINAL HYSTERECTOMY     CARDIAC CATHETERIZATION  03/22/2009   Started medical therapy   CARDIOVERSION  06/05/2009   3 attempts, last attempt successful   CORONARY ANGIOPLASTY WITH STENT PLACEMENT  2009   RCA Vision stent   HIP SURGERY     LOWER EXTREMITY ANGIOGRAPHY Left 08/10/2019   Procedure: Lower Extremity Angiography;  Surgeon: Katha Cabal, MD;  Location: [facility];   Service: Cardiovascular;  Laterality: Left;   MELANOMA EXCISION  06/06/2011   Procedure: MELANOMA EXCISION;  Surgeon: Adin Hector, MD;  Location: [facility];  Service: General;  Laterality: Left;  re excision squamous cell lesion left chest wall    Social History   Occupational History   Occupation: Retired  Tobacco Use   Smoking status: Former Smoker    Packs/day: 0.50   Smokeless tobacco: Never Used  Scientific laboratory technician Use: Never used  Substance and Sexual Activity   Alcohol use: No   Drug use: No   Sexual activity: Never

## 2019-11-25 ENCOUNTER — Telehealth: Payer: Self-pay | Admitting: Internal Medicine

## 2019-11-25 NOTE — Telephone Encounter (Signed)
Spoke with patient's son Shanon Brow regarding Palliative services and offered to schedule a visit for an In-home Consult.  Son wanted to speak with [name] about this first and said that he would call me back today to let me know if they wanted to schedule a visit or to cancel the referral.

## 2019-12-01 ENCOUNTER — Telehealth: Payer: Self-pay

## 2019-12-01 NOTE — Telephone Encounter (Signed)
Spoke with patient's son, [name], to follow up regarding Palliative care referral. Son does not feel that patient needs this at this time and would like for [place] to sign off. Son made aware that if situation changes and he feels Palliative care is needed to contact patient's PCP. Understanding expressed.

## 2019-12-06 ENCOUNTER — Telehealth: Payer: Self-pay | Admitting: Internal Medicine

## 2019-12-06 NOTE — Telephone Encounter (Signed)
Called patient's son Shanon Brow, back to f/u with him to see if he wanted to scheduled an appointment for the Palliative Consult, no answer - left message with my name and contact number requesting a return call.

## 2019-12-08 ENCOUNTER — Telehealth: Payer: Self-pay | Admitting: Internal Medicine

## 2019-12-08 NOTE — Telephone Encounter (Signed)
Spoke with patient's nephew, [name], to see if Shanon Brow (patient's son) had talked with him about Palliative services.  Nephew stated that patient has been mainly staying with Shanon Brow at his home in [place] [facility] currently.  I asked him if he would like to cancel this referral until patient can move back into her home permanently and he agreed to this.  I told him I would cancel the referral and notify MD office of this.

## 2019-12-19 ENCOUNTER — Other Ambulatory Visit: Payer: Self-pay

## 2019-12-19 NOTE — Patient Outreach (Signed)
[place] [facility]) Care Management  12/19/2019  REIZEL CALZADA May 22, 1930 [id number]   Telephone call to son [name]. He states that patient is in [place] with son Shanon Brow presently. He states patient is supposed to return today but fair as he knows patient is doing fine.  Advised CM would call another time. He is agreeable.  Plan: RN CM will attempt again in the month of August.   Jaiyana Canale J Sienna Stonehocker, RN, MSN [place] Management Care Management Coordinator Direct Line [phone] Cell [phone] Toll Free: [phone]  Fax: [phone]

## 2019-12-20 ENCOUNTER — Ambulatory Visit: Payer: Self-pay

## 2020-01-03 DIAGNOSIS — C44729 Squamous cell carcinoma of skin of left lower limb, including hip: Secondary | ICD-10-CM | POA: Diagnosis not present

## 2020-01-03 DIAGNOSIS — C44529 Squamous cell carcinoma of skin of other part of trunk: Secondary | ICD-10-CM | POA: Diagnosis not present

## 2020-01-03 DIAGNOSIS — C44622 Squamous cell carcinoma of skin of right upper limb, including shoulder: Secondary | ICD-10-CM | POA: Diagnosis not present

## 2020-01-03 DIAGNOSIS — D485 Neoplasm of uncertain behavior of skin: Secondary | ICD-10-CM | POA: Diagnosis not present

## 2020-01-03 DIAGNOSIS — Z85828 Personal history of other malignant neoplasm of skin: Secondary | ICD-10-CM | POA: Diagnosis not present

## 2020-01-03 DIAGNOSIS — C44722 Squamous cell carcinoma of skin of right lower limb, including hip: Secondary | ICD-10-CM | POA: Diagnosis not present

## 2020-01-03 DIAGNOSIS — L814 Other melanin hyperpigmentation: Secondary | ICD-10-CM | POA: Diagnosis not present

## 2020-01-03 DIAGNOSIS — L821 Other seborrheic keratosis: Secondary | ICD-10-CM | POA: Diagnosis not present

## 2020-01-09 ENCOUNTER — Other Ambulatory Visit: Payer: Self-pay

## 2020-01-09 NOTE — Patient Outreach (Signed)
[place] [facility]) Care Management  [place]  01/09/2020   Tiffany Charles April 06, 1930 [id number]  Subjective: Telephone call to son Tiffany Charles. He states patient is back with him now.  He states she is doing well. No more falls. He states that her right arm fracture has not healed correctly due to patient not keeping sling on but she seems to be ok. She does complain of pain at times but doing ok.  He states the nurse made a visit recently and stated patient looked great.  He denies swelling or shortness of breath.  Discussed heart failure and when to notify physician. He verbalized understanding.    Objective:   Encounter Medications:  Outpatient Encounter Medications as of 01/09/2020  Medication Sig  . acetaminophen (TYLENOL) 325 MG tablet Take 2 tablets (650 mg total) by mouth every 4 (four) hours as needed for fever.  Marland Kitchen acitretin (SORIATANE) 25 MG capsule Take 25 mg by mouth daily.  Marland Kitchen ascorbic acid (VITAMIN C) 500 MG tablet Take 500 mg by mouth daily.  Marland Kitchen aspirin EC 81 MG tablet Take 81 mg by mouth daily.   Marland Kitchen atorvastatin (LIPITOR) 20 MG tablet Take 1 tablet (20 mg total) by mouth daily at 6 PM.  . cholecalciferol (VITAMIN D3) 25 MCG (1000 UNIT) tablet Take 1,000 Units by mouth daily.  . folic acid (FOLVITE) 1 MG tablet Take 1 tablet (1 mg total) by mouth daily.  Marland Kitchen HYDROcodone-acetaminophen (NORCO/VICODIN) 5-325 MG tablet Take 1 tablet by mouth every 6 (six) hours as needed for moderate pain or severe pain.  Marland Kitchen lactulose (CHRONULAC) 10 GM/15ML solution Take 15 mLs (10 g total) by mouth 2 (two) times daily as needed for mild constipation.  Marland Kitchen LORazepam (ATIVAN) 1 MG tablet Take 1 tablet (1 mg total) by mouth every 6 (six) hours as needed for anxiety.  . metoprolol tartrate (LOPRESSOR) 50 MG tablet Take 1 tablet (50 mg total) by mouth 2 (two) times daily.  . pantoprazole (PROTONIX) 40 MG tablet Take 1 tablet (40 mg total) by mouth 2 (two) times daily.  . polyethylene glycol  (MIRALAX / GLYCOLAX) 17 g packet Take 17 g by mouth daily as needed for moderate constipation.  . tamsulosin (FLOMAX) 0.4 MG CAPS capsule Take 1 capsule (0.4 mg total) by mouth daily.  Marland Kitchen torsemide (DEMADEX) 20 MG tablet Take 1 tablet (20 mg total) by mouth daily.  . traMADol (ULTRAM) 50 MG tablet Take 1 tablet (50 mg total) by mouth every 6 (six) hours as needed.  . vitamin B-12 (CYANOCOBALAMIN) 1000 MCG tablet Take 1,000 mcg by mouth daily.   No facility-administered encounter medications on file as of 01/09/2020.    Functional Status:  In your present state of health, do you have any difficulty performing the following activities: 11/22/2019 10/21/2019  Hearing? N N  Vision? N N  Difficulty concentrating or making decisions? Y Y  Comment dementia dementia  Walking or climbing stairs? Y Y  Comment unsteady gait unsteady gait  Dressing or bathing? Y Y  Comment dementia dementia  Doing errands, shopping? Y Y  Comment dementia-son Tiffany Charles Systems developer and eating ? Y Y  Comment dementia dementia  Using the Toilet? Y Y  Comment incontinent at times incontinent at times  In the past six months, have you accidently leaked urine? Y Y  Comment sometimes wets at night sometimes wets a night  Do you have problems with loss of bowel control? N N  Managing your Medications? Y Y  Comment son Tiffany Charles does son Tiffany Charles does  Managing your Finances? Y Y  Comment son-Tiffany Charles does son Tiffany Charles does  Housekeeping or managing your Housekeeping? Y Y  Comment Son- Tiffany Charles does son Tiffany Charles does  Some recent data might be hidden    Fall/Depression Screening: Fall Risk  11/22/2019 10/21/2019 10/12/2019  Falls in the past year? 1 1 1  Number falls in past yr: 1 1 1  Injury with Fall? 1 1 0  Risk for fall due to : Mental status change;Impaired mobility Impaired mobility;Mental status change Impaired mobility;Impaired vision;Impaired balance/gait;History of fall(s);Mental status change  Follow up  Falls prevention discussed Falls prevention discussed;Education provided Falls evaluation completed   PHQ 2/9 Scores 11/22/2019 10/21/2019 10/20/2019 06/30/2018 04/29/2017 12/28/2014 11/07/2013  PHQ - 2 Score 0 0 0 0 0 0 2  PHQ- 9 Score - - - 4 - - 9    Assessment: Patient continues to maintain health status with help of family. Son Tiffany Charles benefits from disease management follow up support/  Plan:  [facility] CM Care Plan Problem One     Most Recent Value  Care Plan Problem One History of Heart Failure-Multiple ED/hospital visits  Role Documenting the Problem One Care Management Telephonic Coordinator  Care Plan for Problem One Active  THN CM Short Term Goal #1  Patient will not experience exacerbation of heart failure within 30 days.  [facility]N CM Short Term Goal #1 Start Date 01/09/20  Interventions for Short Term Goal #1 Son reports patient in green zone. Denies any swelling or shortness of breath. REiterated signs of heart failure and when to notify physician.   THN CM Short Term Goal #2  Patient will follow up with orthopedic as scheduled within 21 days.  THN CM Short Term Goal #2 Start Date 11/22/19  [facility]N CM Short Term Goal #2 Met Date 01/09/20     RN CM will contact in the month of October and son agreeable.   J , RN, MSN [facility] Care Management Care Management Coordinator Direct Line [phone] Cell [phone] Toll Free: [phone]  Fax: [phone]   

## 2020-01-18 ENCOUNTER — Other Ambulatory Visit: Payer: Self-pay

## 2020-01-18 NOTE — Patient Outreach (Signed)
[place] [facility]) Care Management  01/18/2020  Tiffany Charles [birth year] [id number]   Reached out to the patient/son [name] to complete satisfaction survey. No answer. Not able to leave a message.  [place] Assistant [phone]

## 2020-03-05 ENCOUNTER — Other Ambulatory Visit: Payer: Self-pay

## 2020-03-05 NOTE — Patient Outreach (Signed)
[place] [facility]) Care Management  [place]  03/05/2020   Tiffany Charles 06-08-1929 [id number]  Subjective: Telephone call to son [name] for disease management follow up. He states she is doing well.  He denies any symptoms of heart failure exacerbation.  He states denies any problems with affording medications or patient taking medications.  Discussed heart failure management.  He verbalized understanding and denies any concerns.   Objective:   Encounter Medications:  Outpatient Encounter Medications as of 03/05/2020  Medication Sig   acetaminophen (TYLENOL) 325 MG tablet Take 2 tablets (650 mg total) by mouth every 4 (four) hours as needed for fever.   acitretin (SORIATANE) 25 MG capsule Take 25 mg by mouth daily.   ascorbic acid (VITAMIN C) 500 MG tablet Take 500 mg by mouth daily.   aspirin EC 81 MG tablet Take 81 mg by mouth daily.    atorvastatin (LIPITOR) 20 MG tablet Take 1 tablet (20 mg total) by mouth daily at 6 PM.   cholecalciferol (VITAMIN D3) 25 MCG (1000 UNIT) tablet Take 1,000 Units by mouth daily.   folic acid (FOLVITE) 1 MG tablet Take 1 tablet (1 mg total) by mouth daily.   HYDROcodone-acetaminophen (NORCO/VICODIN) 5-325 MG tablet Take 1 tablet by mouth every 6 (six) hours as needed for moderate pain or severe pain.   lactulose (CHRONULAC) 10 GM/15ML solution Take 15 mLs (10 g total) by mouth 2 (two) times daily as needed for mild constipation.   LORazepam (ATIVAN) 1 MG tablet Take 1 tablet (1 mg total) by mouth every 6 (six) hours as needed for anxiety.   metoprolol tartrate (LOPRESSOR) 50 MG tablet Take 1 tablet (50 mg total) by mouth 2 (two) times daily.   pantoprazole (PROTONIX) 40 MG tablet Take 1 tablet (40 mg total) by mouth 2 (two) times daily.   polyethylene glycol (MIRALAX / GLYCOLAX) 17 g packet Take 17 g by mouth daily as needed for moderate constipation.   tamsulosin (FLOMAX) 0.4 MG CAPS capsule Take 1 capsule (0.4 mg  total) by mouth daily.   torsemide (DEMADEX) 20 MG tablet Take 1 tablet (20 mg total) by mouth daily.   traMADol (ULTRAM) 50 MG tablet Take 1 tablet (50 mg total) by mouth every 6 (six) hours as needed.   vitamin B-12 (CYANOCOBALAMIN) 1000 MCG tablet Take 1,000 mcg by mouth daily.   No facility-administered encounter medications on file as of 03/05/2020.    Functional Status:  In your present state of health, do you have any difficulty performing the following activities: 11/22/2019 10/21/2019  Hearing? N N  Vision? N N  Difficulty concentrating or making decisions? Y Y  Comment dementia dementia  Walking or climbing stairs? Y Y  Comment unsteady gait unsteady gait  Dressing or bathing? Y Y  Comment dementia dementia  Doing errands, shopping? Y Y  Comment dementia-son [employer] developer and eating ? Y Y  Comment dementia dementia  Using the Toilet? Y Y  Comment incontinent at times incontinent at times  In the past six months, have you accidently leaked urine? Y Y  Comment sometimes wets at night sometimes wets a night  Do you have problems with loss of bowel control? N N  Managing your Medications? Tempie Donning  Comment son [name] does son [name] does  Managing your Finances? Y Y  Comment son-Don does son [name] does  Runner, broadcasting/film/video? Y Y  Comment Son- [name] does son [name]  does  Some recent data might be hidden    Fall/Depression Screening: Fall Risk  11/22/2019 10/21/2019 10/12/2019  Falls in the past year? 1 1 1   Number falls in past yr: 1 1 1   Injury with Fall? 1 1 0  Risk for fall due to : Mental status change;Impaired mobility Impaired mobility;Mental status change Impaired mobility;Impaired vision;Impaired balance/gait;History of fall(s);Mental status change  Follow up Falls prevention discussed Falls prevention discussed;Education provided Falls evaluation completed   PHQ 2/9 Scores 03/05/2020 11/22/2019 10/21/2019 10/20/2019  06/30/2018 04/29/2017 12/28/2014  PHQ - 2 Score 0 0 0 0 0 0 0  PHQ- 9 Score - - - - 4 - -    Assessment: Patient able to manage chronic health conditions with assistance of family.   Goals Addressed            This Visit's Progress    Track and Manage Fluids and Swelling       Follow Up Date 06/24/20   - track weight in diary - use salt in moderation - watch for swelling in feet, ankles and legs every day - weigh myself daily    Why is this important?   It is important to check your weight daily and watch how much salt and liquids you have.  It will help you to manage your heart failure.    Notes:      Track and Manage Symptoms       Follow Up Date 06/24/20   - develop a rescue plan - eat more whole grains, fruits and vegetables, lean meats and healthy fats - follow rescue plan if symptoms flare-up - know when to call the doctor    Why is this important?   You will be able to handle your symptoms better if you keep track of them.  Making some simple changes to your lifestyle will help.  Eating healthy is one thing you can do to take good care of yourself.    Notes:        Plan: RN CM will contact in the month of January and son [name] agreeable.  Jone Baseman, RN, MSN [place] Management Care Management Coordinator Direct Line [phone] Cell [phone] Toll Free: [phone]  Fax: [phone]

## 2020-05-04 DIAGNOSIS — L905 Scar conditions and fibrosis of skin: Secondary | ICD-10-CM | POA: Diagnosis not present

## 2020-05-04 DIAGNOSIS — L570 Actinic keratosis: Secondary | ICD-10-CM | POA: Diagnosis not present

## 2020-05-04 DIAGNOSIS — L821 Other seborrheic keratosis: Secondary | ICD-10-CM | POA: Diagnosis not present

## 2020-05-04 DIAGNOSIS — Z85828 Personal history of other malignant neoplasm of skin: Secondary | ICD-10-CM | POA: Diagnosis not present

## 2020-06-04 ENCOUNTER — Other Ambulatory Visit: Payer: Self-pay

## 2020-06-04 NOTE — Patient Outreach (Signed)
[place] [facility]) Care Management  06/04/2020  Tiffany Charles [birth year] [id number]   Telephone call to patient for disease management follow up.   No answer.  HIPAA compliant voice message left.    Plan: If no return call, RN CM will attempt patient again in the month of April.   Jone Baseman, RN, MSN [place] Management Care Management Coordinator Direct Line [phone] Cell [phone] Toll Free: [phone]  Fax: [phone]

## 2020-09-03 ENCOUNTER — Other Ambulatory Visit: Payer: Self-pay

## 2020-09-03 NOTE — Patient Instructions (Signed)
Goals Addressed            This Visit's Progress   . Track and Manage Fluids and Swelling   On track    Timeframe:  Long-Range Goal Priority:  High Start Date:   09/03/20                          Expected End Date:     02/22/21                 Follow Up Date 12/23/20   - use salt in moderation - watch for swelling in feet, ankles and legs every day    Why is this important?   It is important to check your weight daily and watch how much salt and liquids you have.  It will help you to manage your heart failure.    Notes: 09/03/20 Son reports no signs of swelling or shortness of breath.      . Track and Manage Symptoms   On track    Follow Up Date 06/24/20   - follow rescue plan if symptoms flare-up - know when to call the doctor    Why is this important?   You will be able to handle your symptoms better if you keep track of them.  Making some simple changes to your lifestyle will help.  Eating healthy is one thing you can do to take good care of yourself.    Notes: Keep up the great work!!

## 2020-09-03 NOTE — Patient Outreach (Signed)
[place] [facility]) Care Management  [place]  09/03/2020   Tiffany Charles 11/22/1929 [id number]  Subjective: Telephone call to patient for disease management follow up. Spoke with son [name]. He reports that patient is doing good. No signs of swelling or shortness of breath.  Discussed heart failure management.  No concerns.    Objective:   Encounter Medications:  Outpatient Encounter Medications as of 09/03/2020  Medication Sig  . acetaminophen (TYLENOL) 325 MG tablet Take 2 tablets (650 mg total) by mouth every 4 (four) hours as needed for fever.  Marland Kitchen acitretin (SORIATANE) 25 MG capsule Take 25 mg by mouth daily.  Marland Kitchen ascorbic acid (VITAMIN C) 500 MG tablet Take 500 mg by mouth daily.  Marland Kitchen aspirin EC 81 MG tablet Take 81 mg by mouth daily.  Marland Kitchen atorvastatin (LIPITOR) 20 MG tablet Take 1 tablet (20 mg total) by mouth daily at 6 PM.  . cholecalciferol (VITAMIN D3) 25 MCG (1000 UNIT) tablet Take 1,000 Units by mouth daily.  . folic acid (FOLVITE) 1 MG tablet Take 1 tablet (1 mg total) by mouth daily.  Marland Kitchen HYDROcodone-acetaminophen (NORCO/VICODIN) 5-325 MG tablet Take 1 tablet by mouth every 6 (six) hours as needed for moderate pain or severe pain.  Marland Kitchen lactulose (CHRONULAC) 10 GM/15ML solution Take 15 mLs (10 g total) by mouth 2 (two) times daily as needed for mild constipation.  Marland Kitchen LORazepam (ATIVAN) 1 MG tablet Take 1 tablet (1 mg total) by mouth every 6 (six) hours as needed for anxiety.  . metoprolol tartrate (LOPRESSOR) 50 MG tablet Take 1 tablet (50 mg total) by mouth 2 (two) times daily.  . pantoprazole (PROTONIX) 40 MG tablet Take 1 tablet (40 mg total) by mouth 2 (two) times daily.  . polyethylene glycol (MIRALAX / GLYCOLAX) 17 g packet Take 17 g by mouth daily as needed for moderate constipation.  . tamsulosin (FLOMAX) 0.4 MG CAPS capsule Take 1 capsule (0.4 mg total) by mouth daily.  Marland Kitchen torsemide (DEMADEX) 20 MG tablet Take 1 tablet (20 mg total) by mouth daily.  . traMADol  (ULTRAM) 50 MG tablet Take 1 tablet (50 mg total) by mouth every 6 (six) hours as needed.  . vitamin B-12 (CYANOCOBALAMIN) 1000 MCG tablet Take 1,000 mcg by mouth daily.   No facility-administered encounter medications on file as of 09/03/2020.    Functional Status:  In your present state of health, do you have any difficulty performing the following activities: 09/03/2020 11/22/2019  Hearing? N N  Vision? N N  Difficulty concentrating or making decisions? Y Y  Comment dementia dementia  Walking or climbing stairs? Y Y  Comment unsteady gait unsteady gait  Dressing or bathing? Y Y  Comment dementia dementia  Doing errands, shopping? Y Y  Comment dementia-son Don Systems developer and eating ? Tempie Donning  Comment son- [name] does dementia  Using the Toilet? Tempie Donning  Comment son- [name] does incontinent at times  In the past six months, have you accidently leaked urine? Tempie Donning  Comment son- [name] does sometimes wets at night  Do you have problems with loss of bowel control? N N  Managing your Medications? (No Data) Y  Comment son- [name] does son [name] does  Managing your Finances? Y Y  Comment Son [name] does son-Don does  Runner, broadcasting/film/video? Tempie Donning  Comment son [name] does Son- [name] does  Some recent data might be hidden    Fall/Depression Screening:  Fall Risk  09/03/2020 11/22/2019 10/21/2019  Falls in the past year? 0 1 1  Number falls in past yr: - 1 1  Injury with Fall? - 1 1  Risk for fall due to : - Mental status change;Impaired mobility Impaired mobility;Mental status change  Follow up - Falls prevention discussed Falls prevention discussed;Education provided   [facility] 2/9 Scores 09/03/2020 03/05/2020 11/22/2019 10/21/2019 10/20/2019 06/30/2018 04/29/2017  PHQ - 2 Score - 0 0 0 0 0 0  PHQ- 9 Score - - - - - 4 -  Exception Documentation Other- indicate reason in comment box - - - - - -  Not completed son completes assessment - - - - - -    Assessment:  Goals  Addressed            This Visit's Progress   . Track and Manage Fluids and Swelling   On track    Timeframe:  Long-Range Goal Priority:  High Start Date:   09/03/20                          Expected End Date:     02/22/21                 Follow Up Date 12/23/20   - use salt in moderation - watch for swelling in feet, ankles and legs every day    Why is this important?   It is important to check your weight daily and watch how much salt and liquids you have.  It will help you to manage your heart failure.    Notes: 09/03/20 Son reports no signs of swelling or shortness of breath.      . Track and Manage Symptoms   On track    Follow Up Date 06/24/20   - follow rescue plan if symptoms flare-up - know when to call the doctor    Why is this important?   You will be able to handle your symptoms better if you keep track of them.  Making some simple changes to your lifestyle will help.  Eating healthy is one thing you can do to take good care of yourself.    Notes: Keep up the great work!!       Plan: RN CM will follow up in July. Follow-up:  Patient agrees to Care Plan and Follow-up.   Jone Baseman, RN, MSN [place] Management Care Management Coordinator Direct Line [phone] Cell [phone] Toll Free: [phone]  Fax: [phone]

## 2020-10-18 IMAGING — CT CT CERVICAL SPINE W/O CM
4 series · 15 of 33 positions shown, 18 images · non-contrast
Comparison: None.

CLINICAL DATA: 89-year-old female with trauma.

EXAM:
CT CERVICAL SPINE WITHOUT CONTRAST
TECHNIQUE: Multidetector CT imaging of the cervical spine was performed without
intravenous contrast. Multiplanar CT image reconstructions were also
generated.

[Series 4: c_spine 2.0 st · axial · 0.40mm/px · z∈[-313,-187]mm · 5 of 95 slices shown, 7 images]
[im 16/95  soft-tissue]
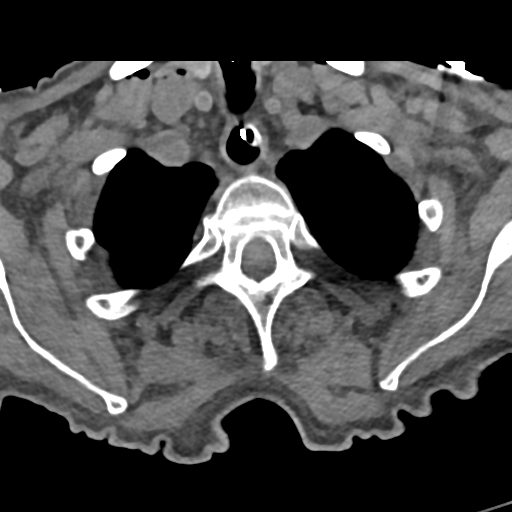
[im 16/95  bone]
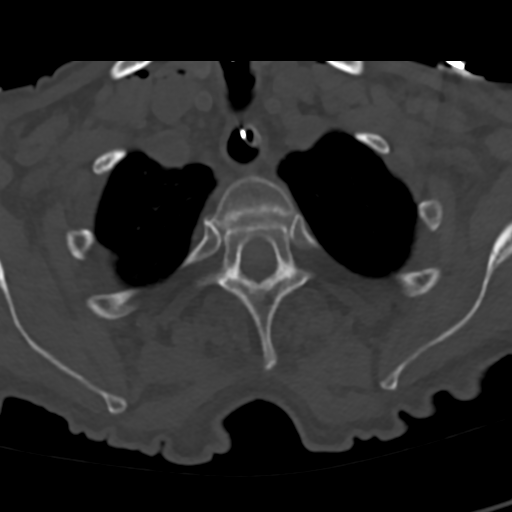
[im 32/95  bone]
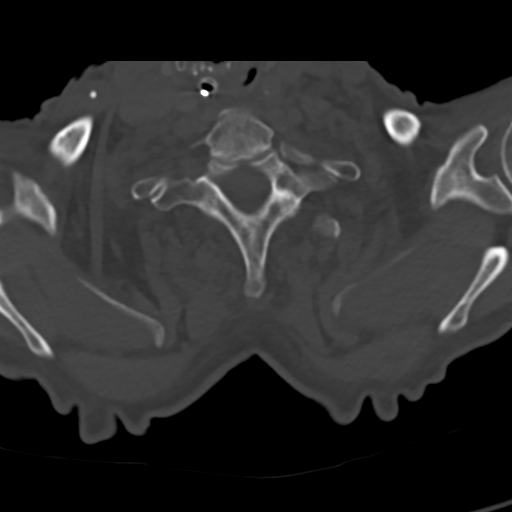
[im 48/95  bone]
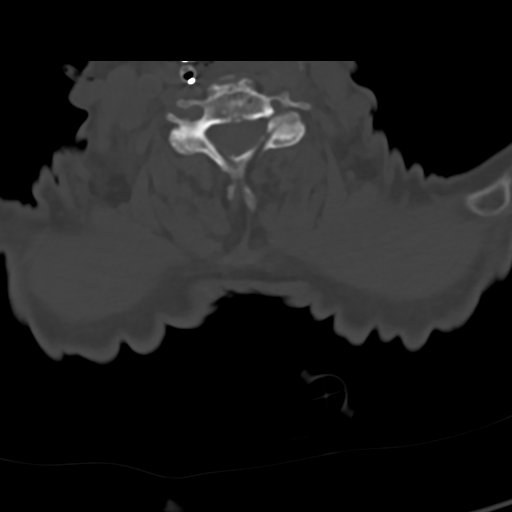
[im 63/95  bone]
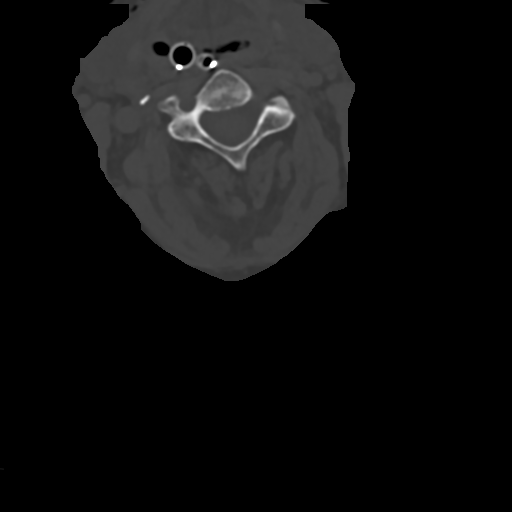
[im 79/95  soft-tissue]
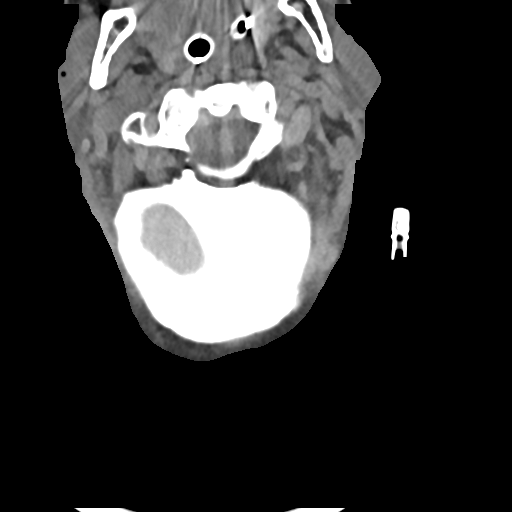
[im 79/95  bone]
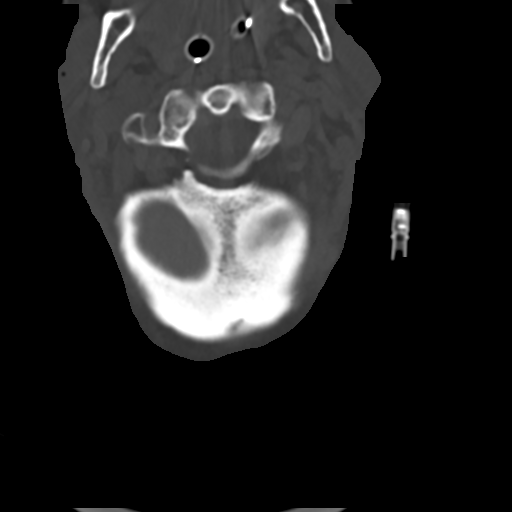

[Series 8: c_spine 2.0 orthogonals · axial · 0.21mm/px · z∈[-333,-311]mm · 2 of 107 slices shown]
[im 18/107  bone]
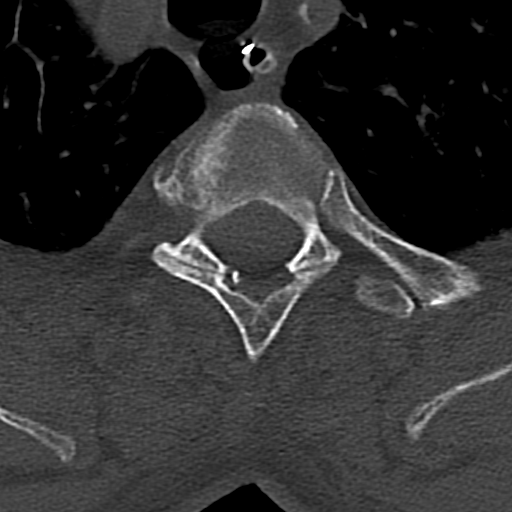
[im 36/107  bone]
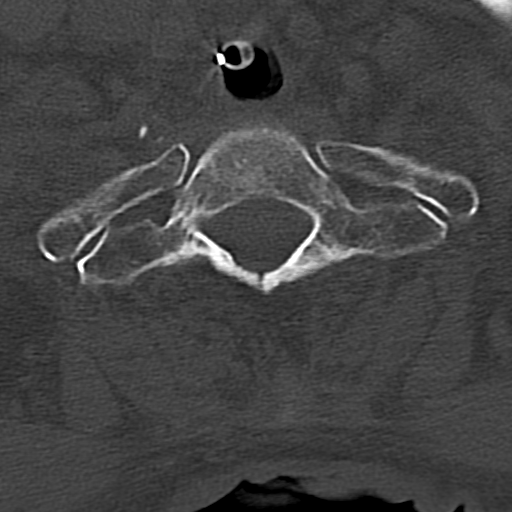

[Series 9: c_spine 2.0 sag bone · sagittal · 0.36mm/px · 5 of 63 slices shown, 6 images]
[im 21/63  bone]
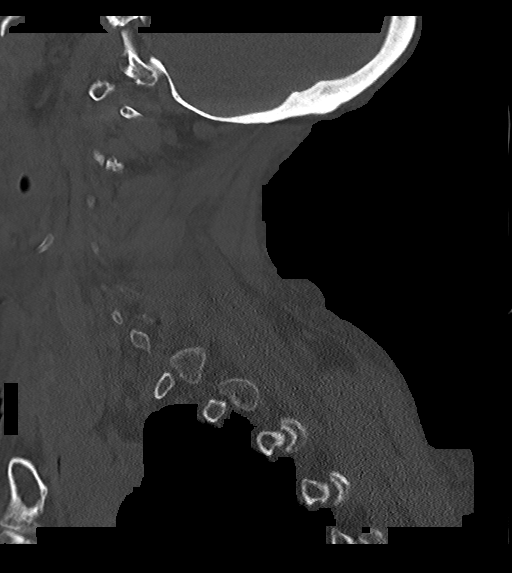
[im 26/63  bone]
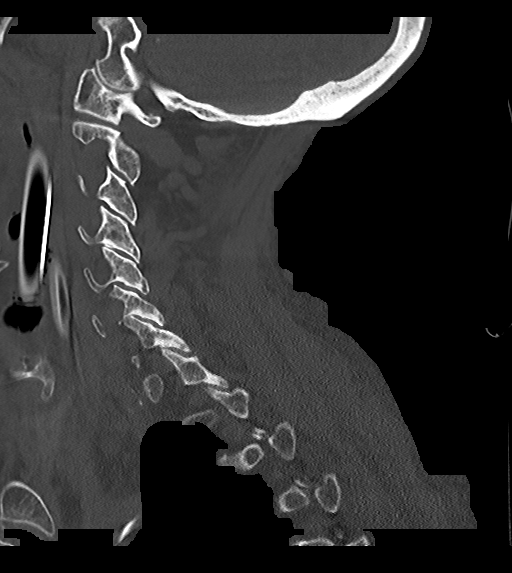
[im 32/63  soft-tissue]
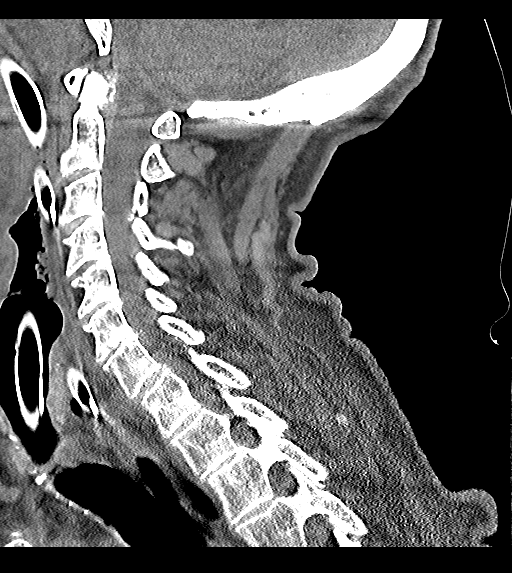
[im 32/63  bone]
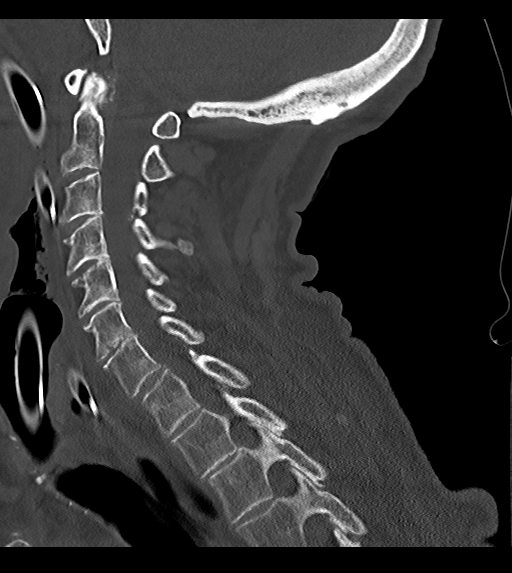
[im 37/63  bone]
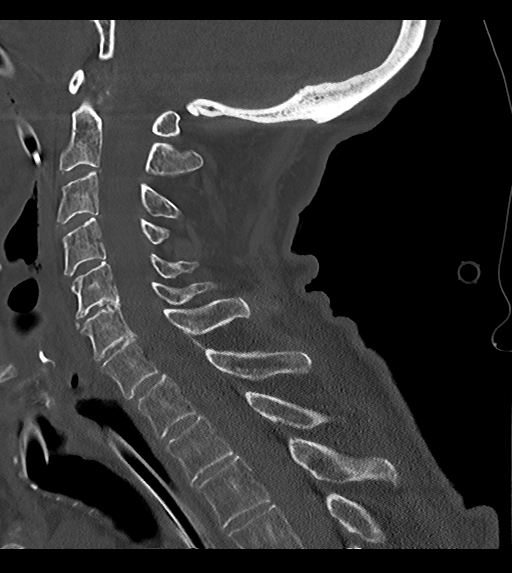
[im 42/63  bone]
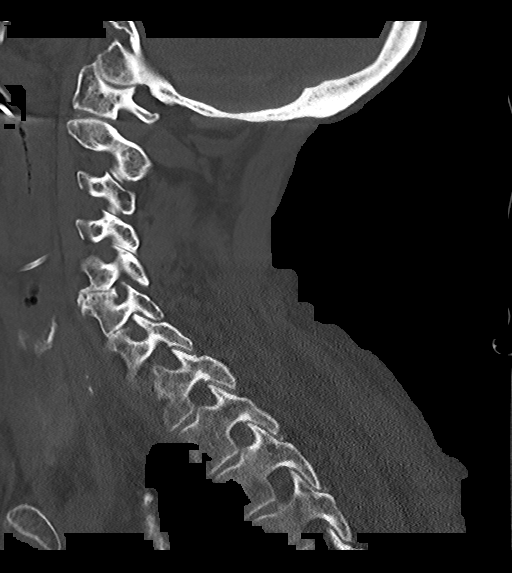

[Series 10: c_spine 2.0 cor bone · coronal · 0.28mm/px · 3 of 86 slices shown]
[im 18/86  bone]
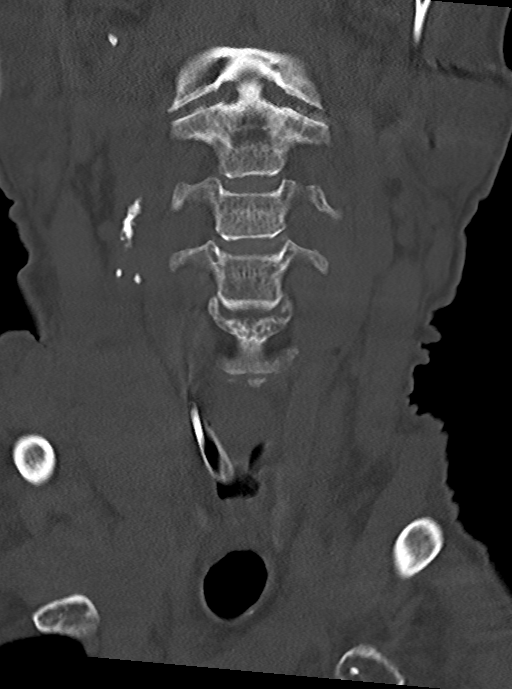
[im 35/86  bone]
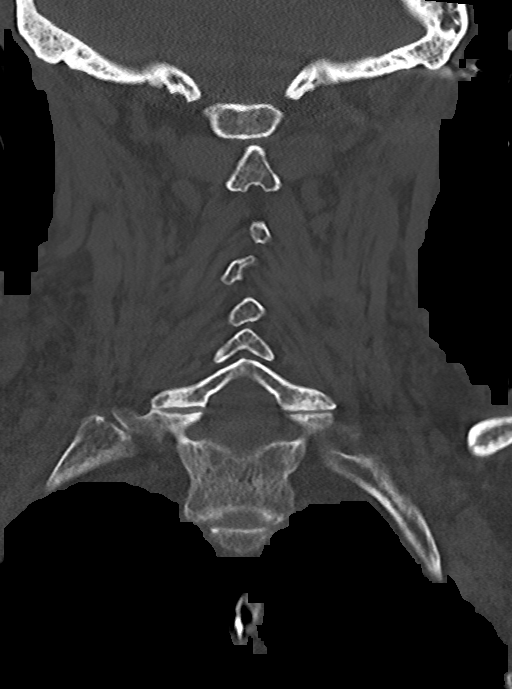
[im 52/86  bone]
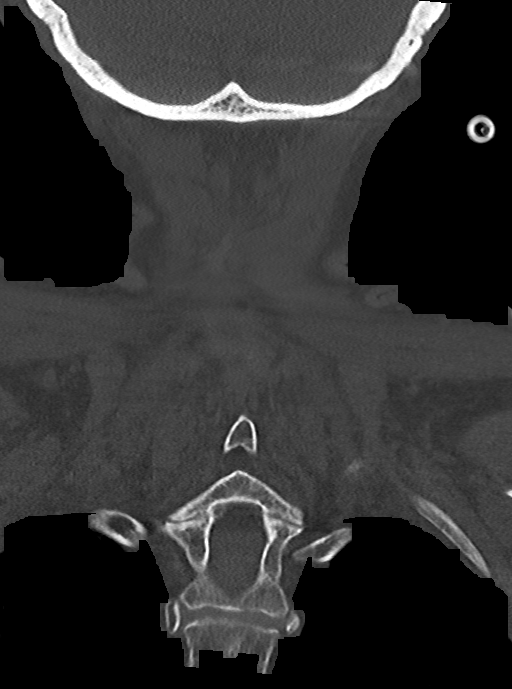

[15 of 33 positions shown; findings below may reference images not displayed]

FINDINGS: Alignment: No acute subluxation.

Skull base and vertebrae: No acute fracture.  Osteopenia.

Soft tissues and spinal canal: No prevertebral fluid or swelling. No
visible canal hematoma.

Disc levels:  Multilevel degenerative changes.

Upper chest: Emphysema. Diffuse interstitial and interlobular septal
thickening, likely edema. Clinical correlation is recommended.

Other: An endotracheal and an enteric tube are partially visualized.
The endotracheal tube is above the carina. Bilateral carotid bulb
calcified plaques. A venous line is noted over the right
supraclavicular region.
IMPRESSION: No acute/traumatic cervical spine pathology.

## 2020-10-18 IMAGING — DX DG CHEST 1V PORT
1 series · 1 of 1 positions shown · non-contrast
Comparison: June 13, 2019

CLINICAL DATA: Status post intubation.

EXAM:
PORTABLE CHEST 1 VIEW

[chest ap]
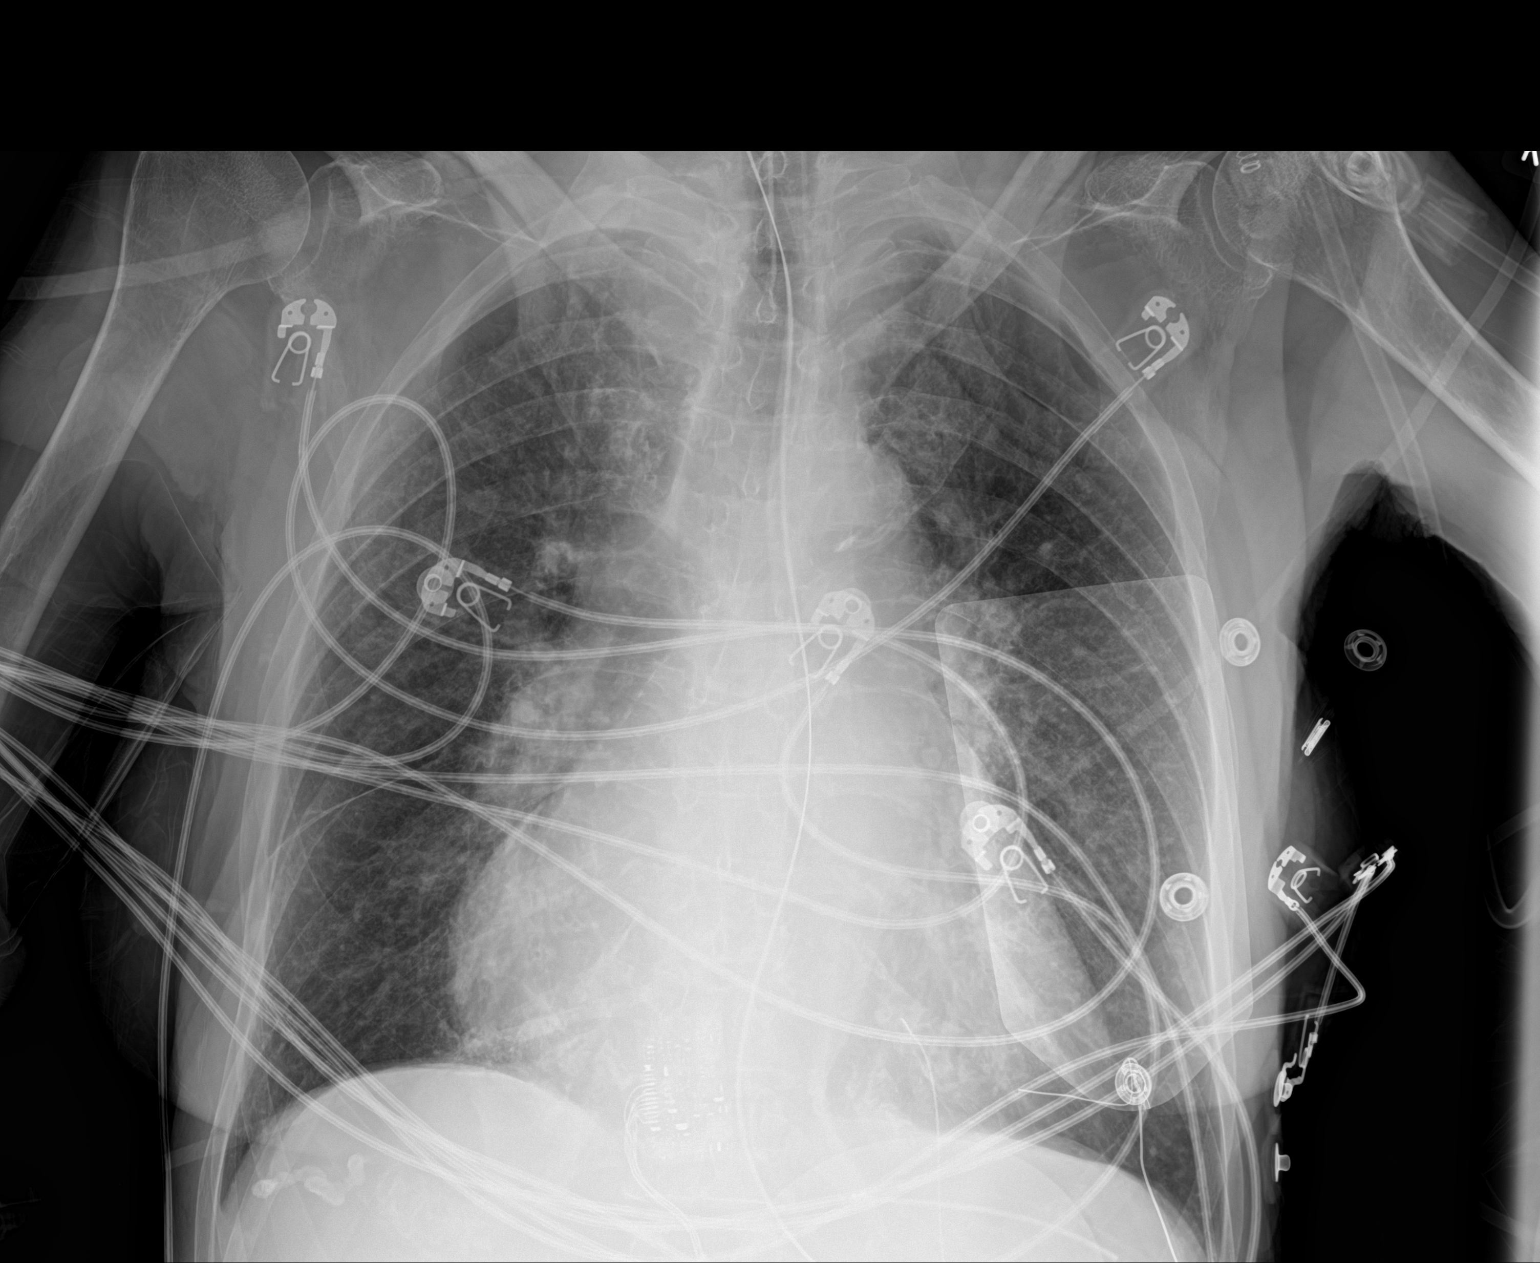

[1 of 1 positions shown; findings below may reference images not displayed]

FINDINGS: An endotracheal tube is seen with its distal tip approximately
cm from the carina.

A nasogastric tube is noted with its distal end extending below the
level of the diaphragm.

Mild, chronic appearing increased lung markings are seen
bilaterally.

There is no evidence of a pleural effusion or pneumothorax.

The cardiac silhouette is markedly enlarged and unchanged in size.

The visualized skeletal structures are unremarkable.
IMPRESSION: 1. Endotracheal tube and nasogastric tube positioning, as described
above.
2. Chronic appearing increased lung markings without evidence of
acute or active cardiopulmonary disease.

## 2020-10-19 DIAGNOSIS — I10 Essential (primary) hypertension: Secondary | ICD-10-CM | POA: Diagnosis not present

## 2020-10-19 DIAGNOSIS — R404 Transient alteration of awareness: Secondary | ICD-10-CM | POA: Diagnosis not present

## 2020-10-19 DIAGNOSIS — R42 Dizziness and giddiness: Secondary | ICD-10-CM | POA: Diagnosis not present

## 2020-10-19 DIAGNOSIS — R000 Tachycardia, unspecified: Secondary | ICD-10-CM | POA: Diagnosis not present

## 2020-10-19 IMAGING — DX DG ABD PORTABLE 1V
1 series · 2 of 2 positions shown · non-contrast
Comparison: None.

CLINICAL DATA: OG tube placement

EXAM:
PORTABLE ABDOMEN - 1 VIEW

[Series 1: abdomen · 0.14mm/px · 2 of 2 slices shown]
[im 1/2]
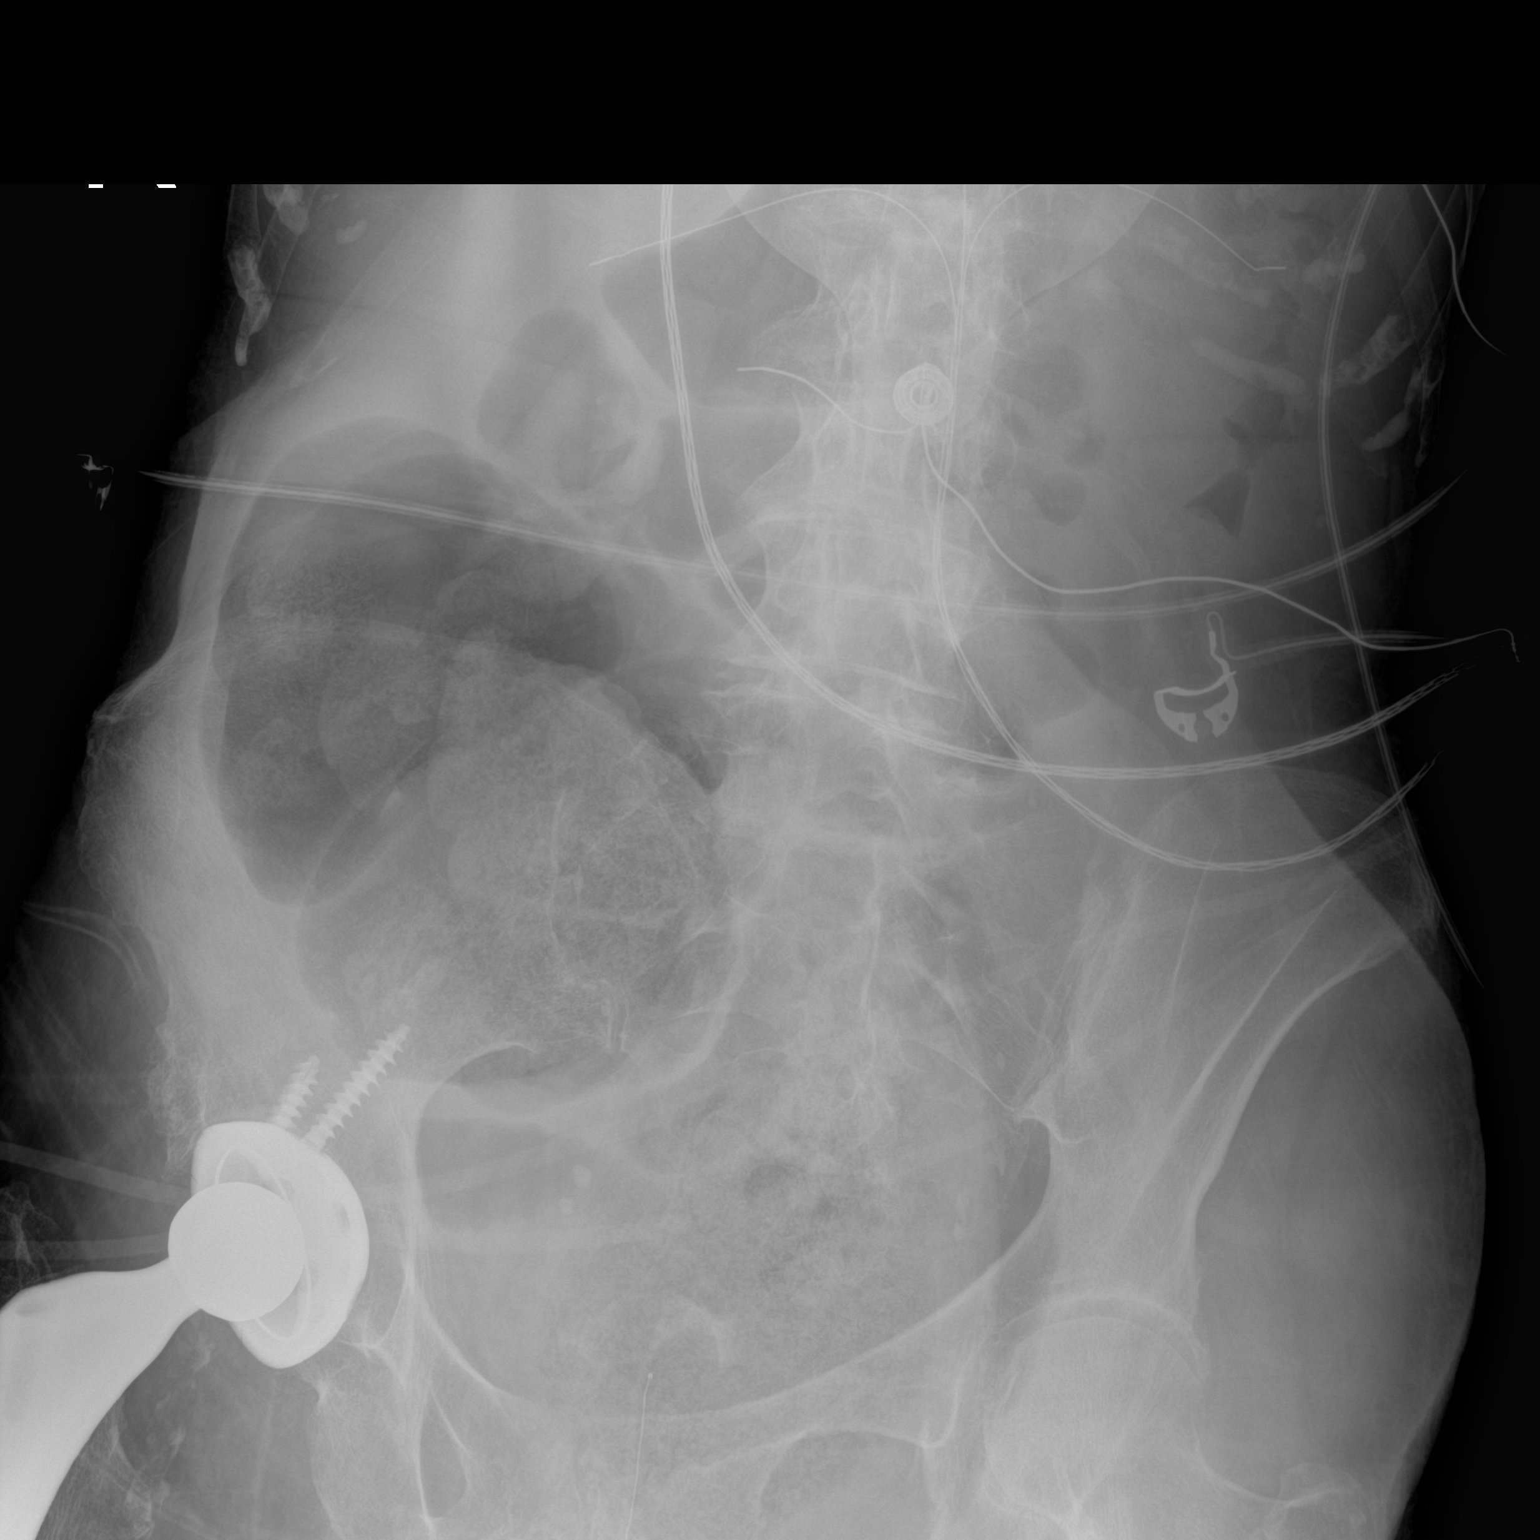
[im 2/2]
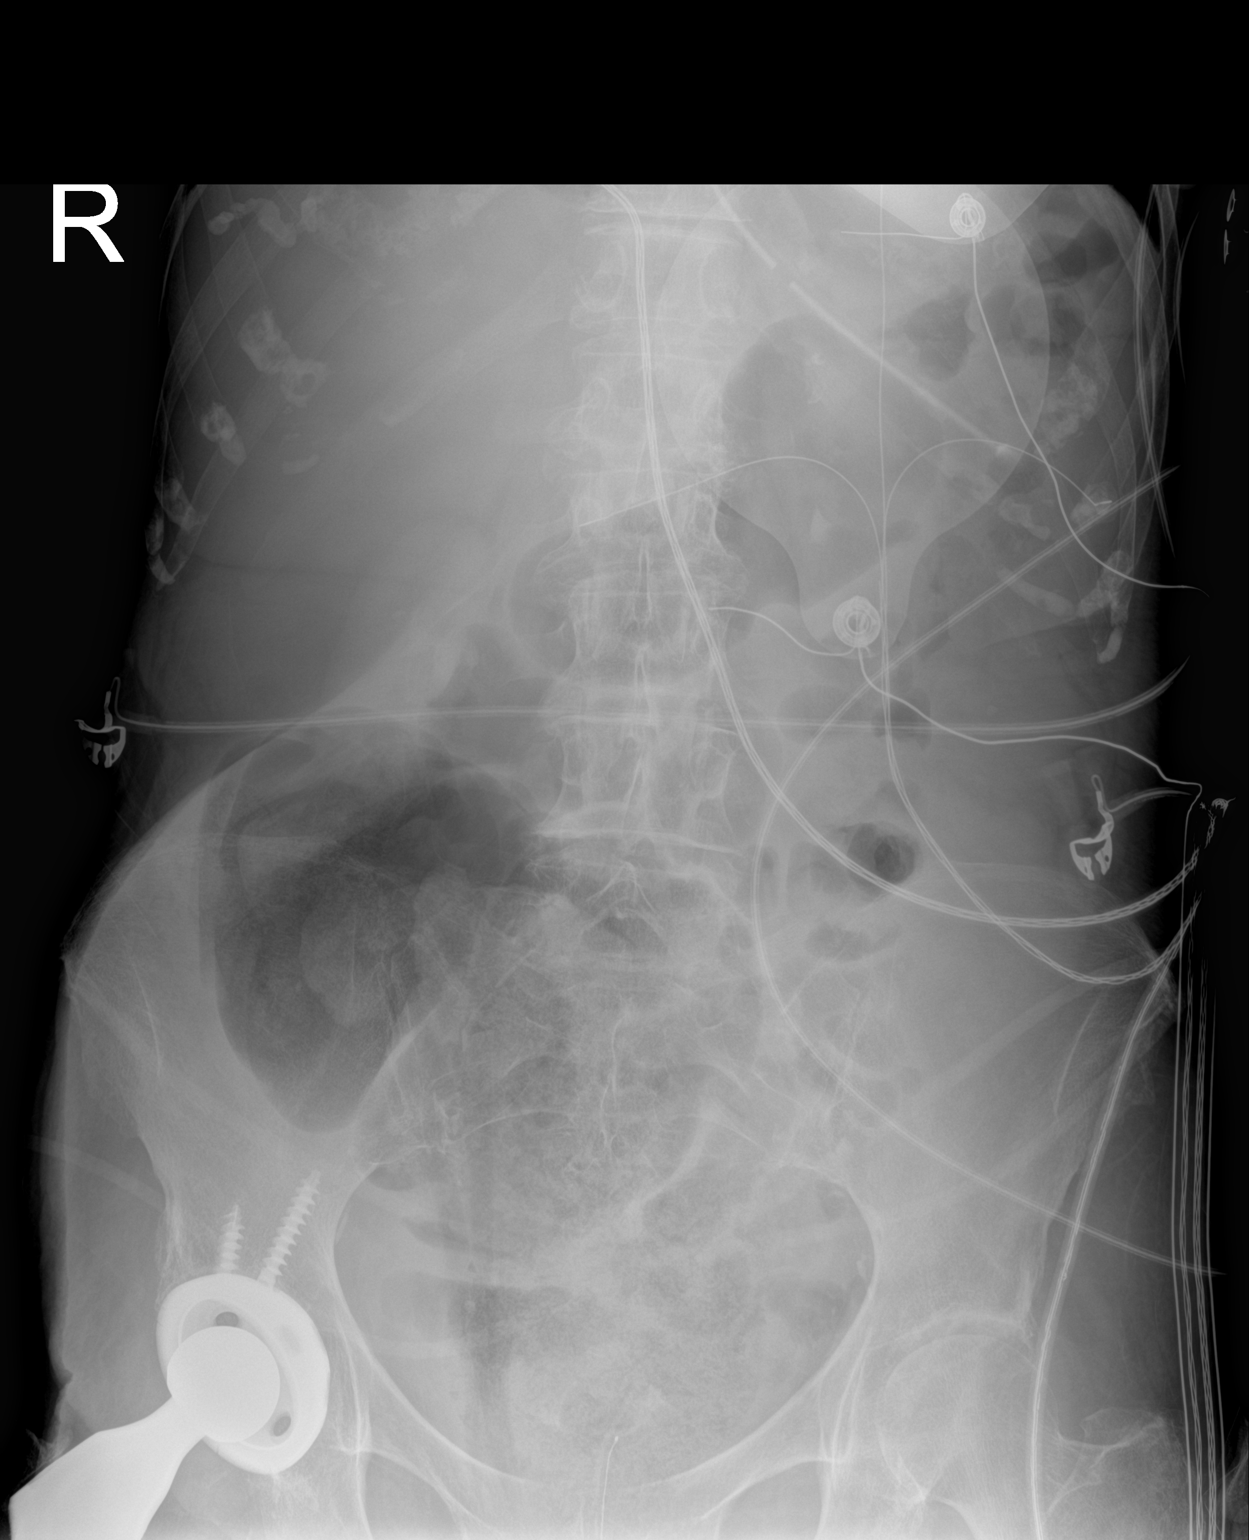

[2 of 2 positions shown; findings below may reference images not displayed]

FINDINGS: The OG tube projects over the gastric body. There is a large amount
of stool at the level of the rectum and cecum. The bowel gas pattern
appears to be nonobstructive. There is no pneumatosis or free air.
IMPRESSION: 1. OG tube projects over the gastric body.
2. Large amount of stool at the level of the rectum and cecum.

## 2020-10-19 IMAGING — DX DG CHEST 1V PORT
1 series · 1 of 1 positions shown · non-contrast
Comparison: September 02, 2019 study obtained earlier in the day

CLINICAL DATA: Central catheter placement.  Hypoxia.

EXAM:
PORTABLE CHEST 1 VIEW

[chest]
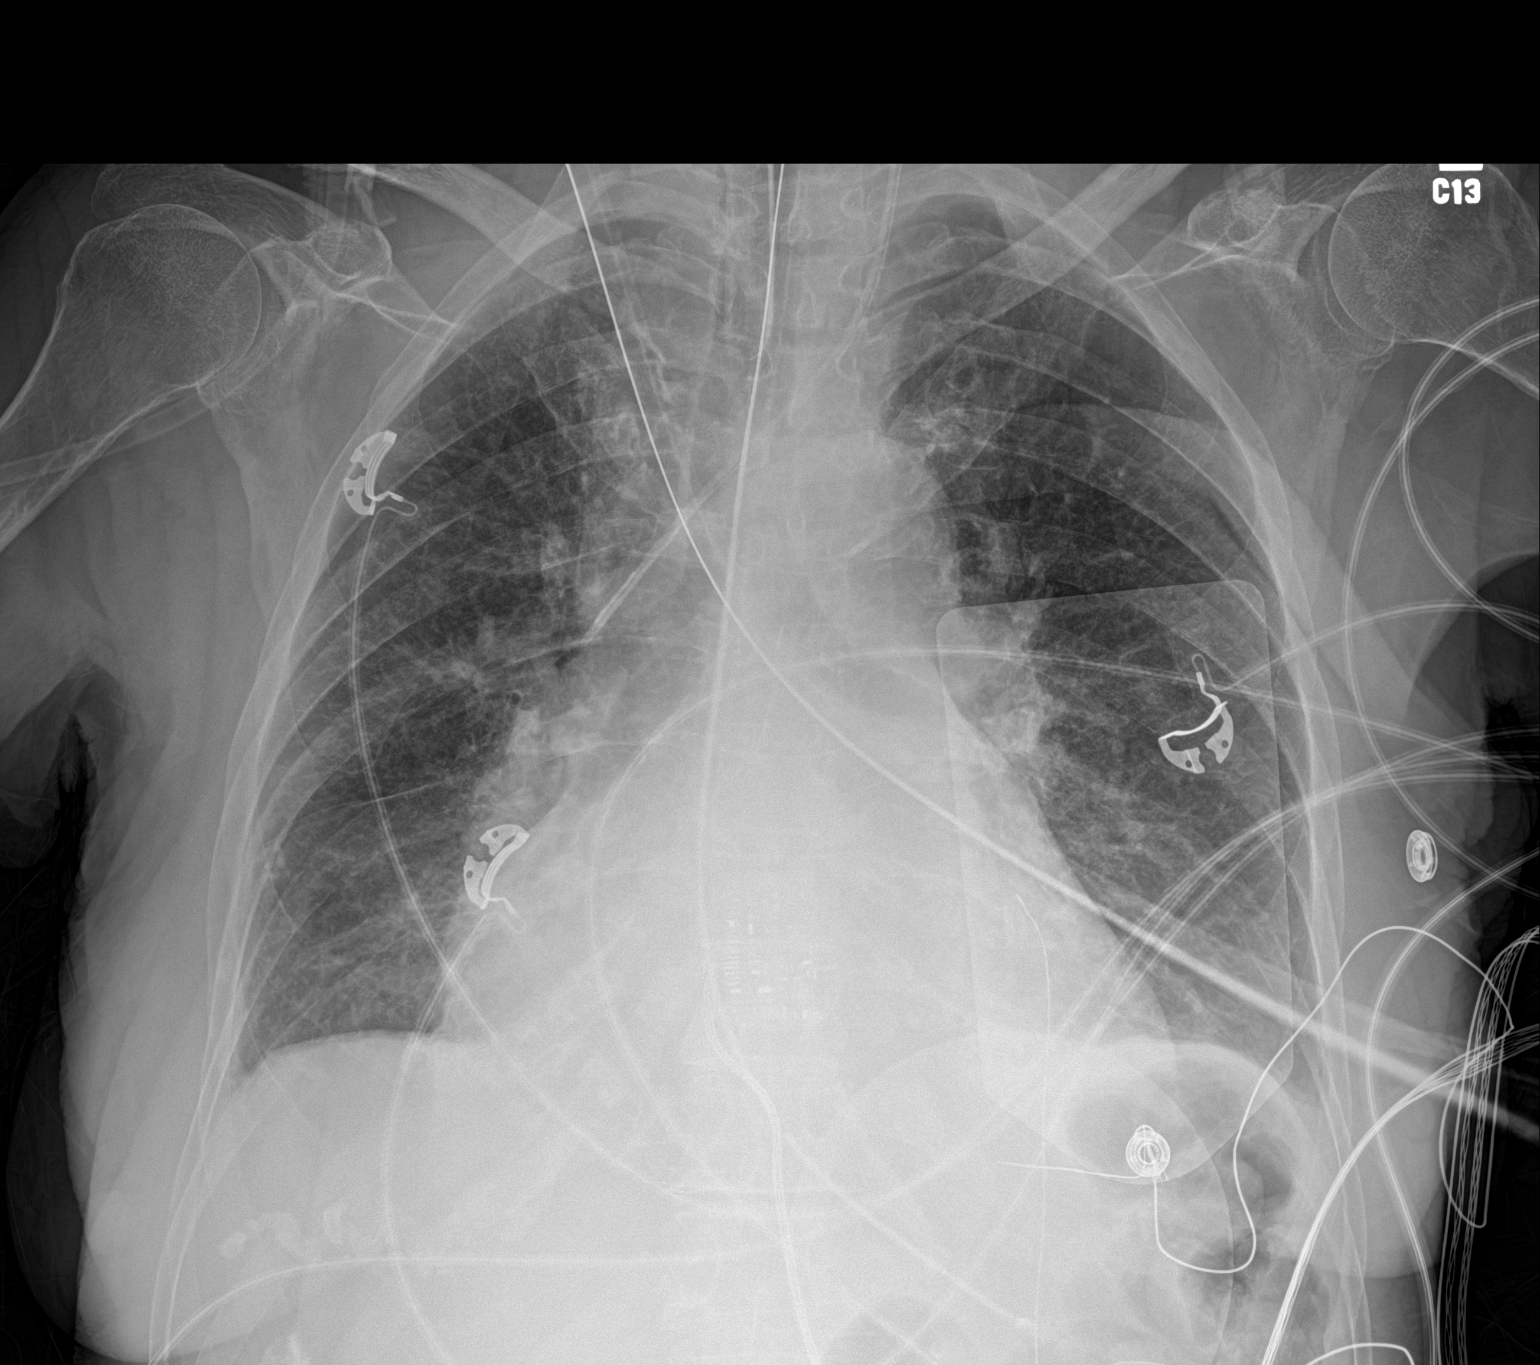

[1 of 1 positions shown; findings below may reference images not displayed]

FINDINGS: Central catheter tip in superior vena cava. Endotracheal tube tip
5.1 cm above the carina. Nasogastric tube tip in proximal stomach
with side port at gastroesophageal junction. No pneumothorax.

Lungs are clear. Heart mildly enlarged. Pulmonary vascularity
normal. No adenopathy. There is aortic atherosclerosis. No bone
lesions.
IMPRESSION: Tube and catheter positions as described without pneumothorax.
Stable cardiomegaly. Lungs clear. Aortic Atherosclerosis
([id number]).

## 2020-10-19 IMAGING — DX DG CHEST 1V PORT
1 series · 1 of 1 positions shown · non-contrast
Comparison: One-view chest x-ray 09/01/2019

CLINICAL DATA: Intubated.

EXAM:
PORTABLE CHEST 1 VIEW

[chest]
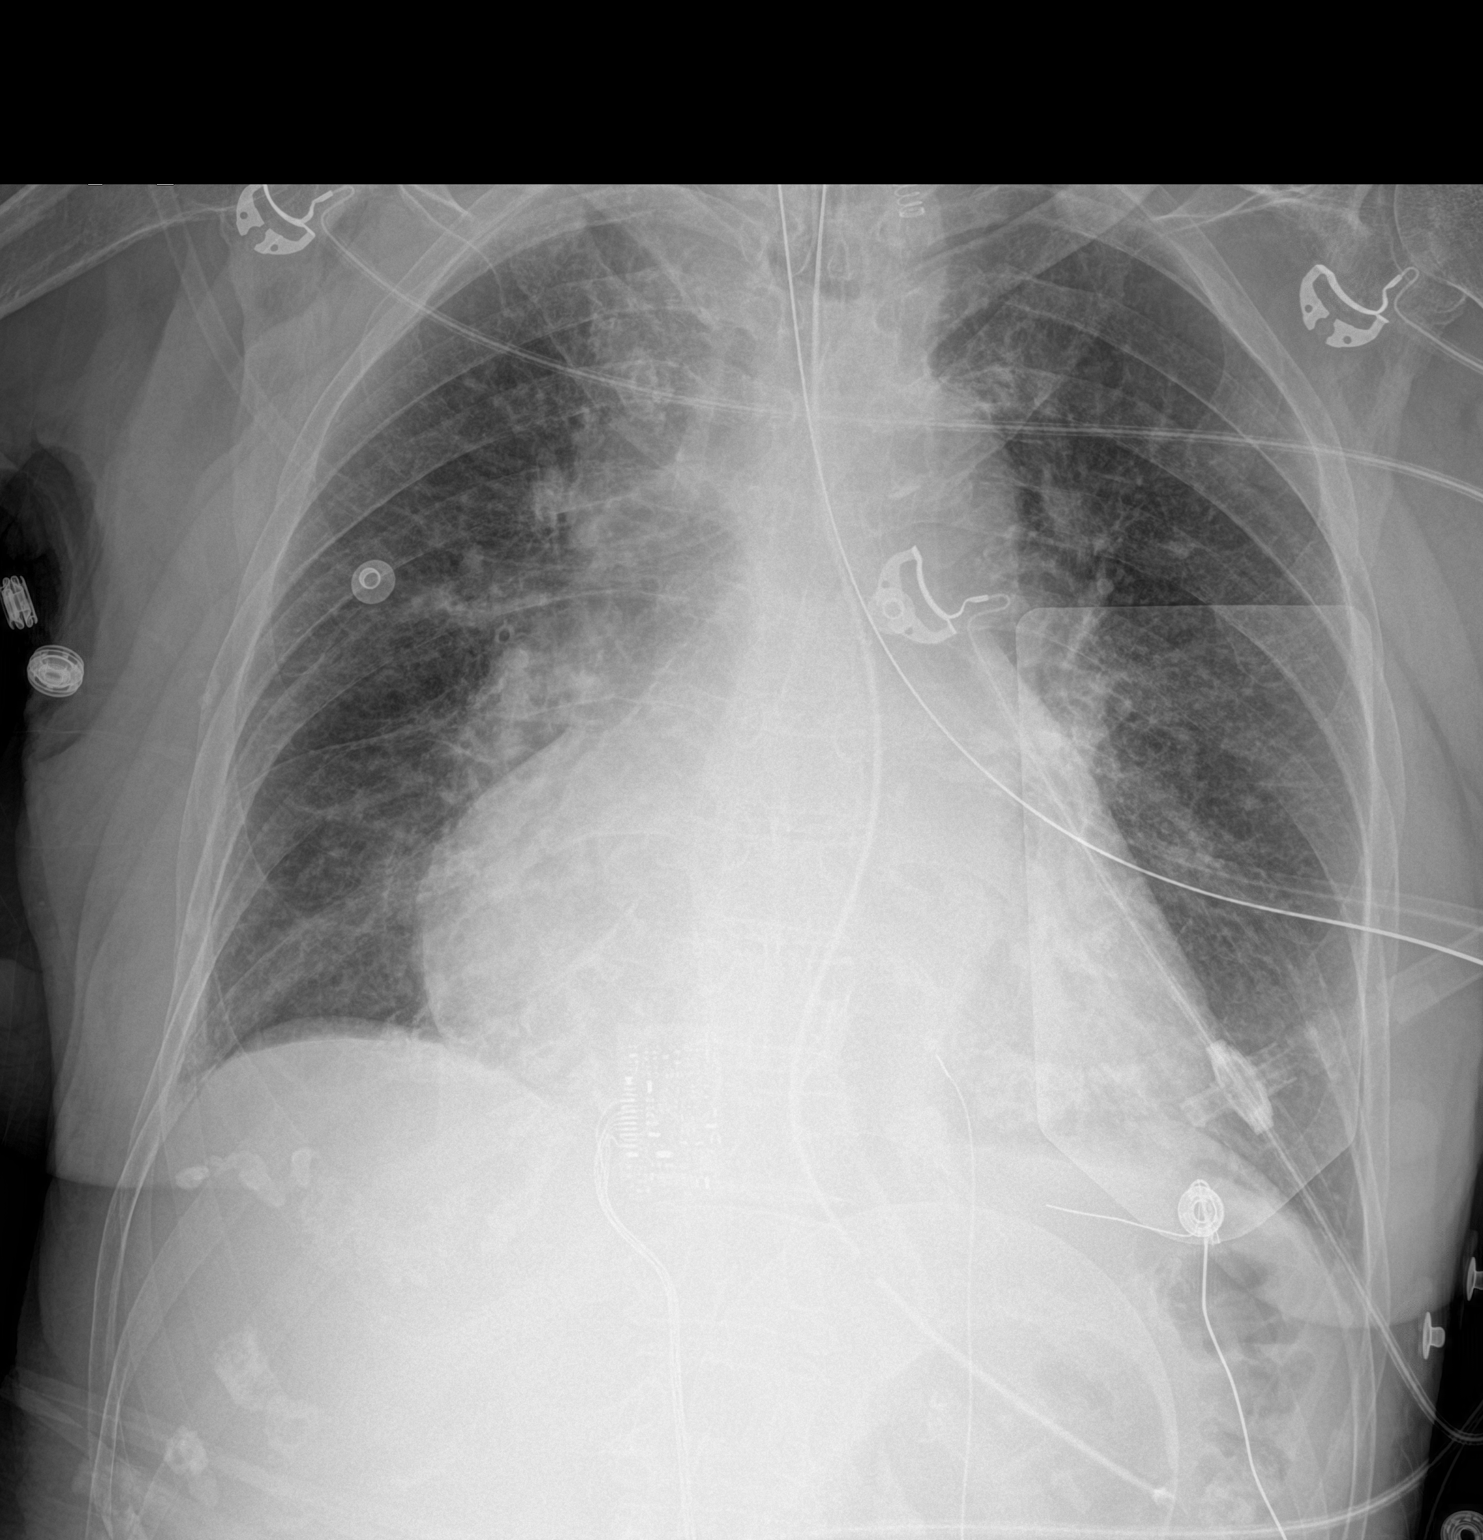

[1 of 1 positions shown; findings below may reference images not displayed]

FINDINGS: Heart size is normal scratched at the heart is enlarged.
Endotracheal tube is stable. Side port of the NG tube is just beyond
the GE junction. Mild pulmonary vascular congestion is present.
Bibasilar airspace disease and small effusions have increased
slightly.
IMPRESSION: 1. Stable cardiomegaly and mild pulmonary vascular congestion.
2. Increasing bibasilar airspace disease and small effusions. While
this likely reflects atelectasis, infection is not excluded.

## 2020-11-10 DIAGNOSIS — I10 Essential (primary) hypertension: Secondary | ICD-10-CM | POA: Diagnosis not present

## 2020-11-10 DIAGNOSIS — R001 Bradycardia, unspecified: Secondary | ICD-10-CM | POA: Diagnosis not present

## 2020-11-10 DIAGNOSIS — I213 ST elevation (STEMI) myocardial infarction of unspecified site: Secondary | ICD-10-CM | POA: Diagnosis not present

## 2020-11-10 DIAGNOSIS — I499 Cardiac arrhythmia, unspecified: Secondary | ICD-10-CM | POA: Diagnosis not present

## 2020-11-10 DIAGNOSIS — I959 Hypotension, unspecified: Secondary | ICD-10-CM | POA: Diagnosis not present

## 2020-11-11 ENCOUNTER — Emergency Department (HOSPITAL_COMMUNITY): Payer: Medicare HMO

## 2020-11-11 ENCOUNTER — Other Ambulatory Visit: Payer: Self-pay

## 2020-11-11 ENCOUNTER — Encounter (HOSPITAL_COMMUNITY): Admission: EM | Disposition: E | Payer: Self-pay | Source: Home / Self Care | Attending: Cardiovascular Disease

## 2020-11-11 ENCOUNTER — Encounter (HOSPITAL_COMMUNITY): Payer: Self-pay

## 2020-11-11 DIAGNOSIS — I2111 ST elevation (STEMI) myocardial infarction involving right coronary artery: Secondary | ICD-10-CM

## 2020-11-11 DIAGNOSIS — I110 Hypertensive heart disease with heart failure: Secondary | ICD-10-CM | POA: Diagnosis present

## 2020-11-11 DIAGNOSIS — G309 Alzheimer's disease, unspecified: Secondary | ICD-10-CM | POA: Diagnosis not present

## 2020-11-11 DIAGNOSIS — Z885 Allergy status to narcotic agent status: Secondary | ICD-10-CM

## 2020-11-11 DIAGNOSIS — I2119 ST elevation (STEMI) myocardial infarction involving other coronary artery of inferior wall: Secondary | ICD-10-CM | POA: Diagnosis not present

## 2020-11-11 DIAGNOSIS — I4821 Permanent atrial fibrillation: Secondary | ICD-10-CM | POA: Diagnosis present

## 2020-11-11 DIAGNOSIS — Z20822 Contact with and (suspected) exposure to covid-19: Secondary | ICD-10-CM | POA: Diagnosis not present

## 2020-11-11 DIAGNOSIS — F32A Depression, unspecified: Secondary | ICD-10-CM | POA: Diagnosis present

## 2020-11-11 DIAGNOSIS — I5032 Chronic diastolic (congestive) heart failure: Secondary | ICD-10-CM | POA: Diagnosis not present

## 2020-11-11 DIAGNOSIS — Z66 Do not resuscitate: Secondary | ICD-10-CM | POA: Diagnosis not present

## 2020-11-11 DIAGNOSIS — Z823 Family history of stroke: Secondary | ICD-10-CM | POA: Diagnosis not present

## 2020-11-11 DIAGNOSIS — Z87891 Personal history of nicotine dependence: Secondary | ICD-10-CM

## 2020-11-11 DIAGNOSIS — E785 Hyperlipidemia, unspecified: Secondary | ICD-10-CM | POA: Diagnosis present

## 2020-11-11 DIAGNOSIS — Z515 Encounter for palliative care: Secondary | ICD-10-CM | POA: Diagnosis not present

## 2020-11-11 DIAGNOSIS — Z79899 Other long term (current) drug therapy: Secondary | ICD-10-CM

## 2020-11-11 DIAGNOSIS — J449 Chronic obstructive pulmonary disease, unspecified: Secondary | ICD-10-CM | POA: Diagnosis present

## 2020-11-11 DIAGNOSIS — Z85828 Personal history of other malignant neoplasm of skin: Secondary | ICD-10-CM | POA: Diagnosis not present

## 2020-11-11 DIAGNOSIS — Z7901 Long term (current) use of anticoagulants: Secondary | ICD-10-CM | POA: Diagnosis not present

## 2020-11-11 DIAGNOSIS — F0280 Dementia in other diseases classified elsewhere without behavioral disturbance: Secondary | ICD-10-CM | POA: Diagnosis present

## 2020-11-11 DIAGNOSIS — I213 ST elevation (STEMI) myocardial infarction of unspecified site: Secondary | ICD-10-CM | POA: Diagnosis present

## 2020-11-11 DIAGNOSIS — I2510 Atherosclerotic heart disease of native coronary artery without angina pectoris: Secondary | ICD-10-CM | POA: Diagnosis present

## 2020-11-11 DIAGNOSIS — Z7982 Long term (current) use of aspirin: Secondary | ICD-10-CM | POA: Diagnosis not present

## 2020-11-11 DIAGNOSIS — Z955 Presence of coronary angioplasty implant and graft: Secondary | ICD-10-CM

## 2020-11-11 DIAGNOSIS — Z8249 Family history of ischemic heart disease and other diseases of the circulatory system: Secondary | ICD-10-CM

## 2020-11-11 DIAGNOSIS — Z9849 Cataract extraction status, unspecified eye: Secondary | ICD-10-CM

## 2020-11-11 DIAGNOSIS — R1110 Vomiting, unspecified: Secondary | ICD-10-CM | POA: Diagnosis not present

## 2020-11-11 LAB — COMPREHENSIVE METABOLIC PANEL
ALT: 9 U/L (ref 0–44)
AST: 28 U/L (ref 15–41)
Albumin: 3.5 g/dL (ref 3.5–5.0)
Alkaline Phosphatase: 66 U/L (ref 38–126)
Anion gap: 10 (ref 5–15)
BUN: 14 mg/dL (ref 8–23)
CO2: 24 mmol/L (ref 22–32)
Calcium: 8.8 mg/dL — ABNORMAL LOW (ref 8.9–10.3)
Chloride: 110 mmol/L (ref 98–111)
Creatinine, Ser: 1.16 mg/dL — ABNORMAL HIGH (ref 0.44–1.00)
GFR, Estimated: 45 mL/min — ABNORMAL LOW (ref >60)
Glucose, Bld: 180 mg/dL — ABNORMAL HIGH (ref 70–99)
Potassium: 3.2 mmol/L — ABNORMAL LOW (ref 3.5–5.1)
Sodium: 144 mmol/L (ref 135–145)
Total Bilirubin: 0.4 mg/dL (ref 0.3–1.2)
Total Protein: 6.3 g/dL — ABNORMAL LOW (ref 6.5–8.1)

## 2020-11-11 LAB — CBC WITH DIFFERENTIAL/PLATELET
Abs Immature Granulocytes: 0.08 10*3/uL — ABNORMAL HIGH (ref 0.00–0.07)
Basophils Absolute: 0.1 10*3/uL (ref 0.0–0.1)
Basophils Relative: 1 %
Eosinophils Absolute: 0.4 10*3/uL (ref 0.0–0.5)
Eosinophils Relative: 2 %
HCT: 38.2 % (ref 36.0–46.0)
Hemoglobin: 12.7 g/dL (ref 12.0–15.0)
Immature Granulocytes: 0 %
Lymphocytes Relative: 45 %
Lymphs Abs: 8.2 10*3/uL — ABNORMAL HIGH (ref 0.7–4.0)
MCH: 32.3 pg (ref 26.0–34.0)
MCHC: 33.2 g/dL (ref 30.0–36.0)
MCV: 97.2 fL (ref 80.0–100.0)
Monocytes Absolute: 1.3 10*3/uL — ABNORMAL HIGH (ref 0.1–1.0)
Monocytes Relative: 7 %
Neutro Abs: 8 10*3/uL — ABNORMAL HIGH (ref 1.7–7.7)
Neutrophils Relative %: 45 %
Platelets: 185 10*3/uL (ref 150–400)
RBC: 3.93 MIL/uL (ref 3.87–5.11)
RDW: 13.9 % (ref 11.5–15.5)
Smear Review: ADEQUATE
WBC: 18.1 10*3/uL — ABNORMAL HIGH (ref 4.0–10.5)
nRBC: 0 % (ref 0.0–0.2)

## 2020-11-11 LAB — LIPID PANEL
Cholesterol: 191 mg/dL (ref 0–200)
HDL: 60 mg/dL (ref >40)
LDL Cholesterol: 112 mg/dL — ABNORMAL HIGH (ref 0–99)
Total CHOL/HDL Ratio: 3.2 RATIO
Triglycerides: 93 mg/dL (ref <150)
VLDL: 19 mg/dL (ref 0–40)

## 2020-11-11 LAB — I-STAT CHEM 8, ED
BUN: 17 mg/dL (ref 8–23)
Calcium, Ion: 1.11 mmol/L — ABNORMAL LOW (ref 1.15–1.40)
Chloride: 107 mmol/L (ref 98–111)
Creatinine, Ser: 1 mg/dL (ref 0.44–1.00)
Glucose, Bld: 178 mg/dL — ABNORMAL HIGH (ref 70–99)
HCT: 38 % (ref 36.0–46.0)
Hemoglobin: 12.9 g/dL (ref 12.0–15.0)
Potassium: 3.2 mmol/L — ABNORMAL LOW (ref 3.5–5.1)
Sodium: 146 mmol/L — ABNORMAL HIGH (ref 135–145)
TCO2: 25 mmol/L (ref 22–32)

## 2020-11-11 LAB — RESP PANEL BY RT-PCR (FLU A&B, COVID) ARPGX2
Influenza A by PCR: NEGATIVE
Influenza B by PCR: NEGATIVE
SARS Coronavirus 2 by RT PCR: NEGATIVE

## 2020-11-11 LAB — TROPONIN I (HIGH SENSITIVITY): Troponin I (High Sensitivity): 106 ng/L (ref <18)

## 2020-11-11 LAB — LACTIC ACID, PLASMA: Lactic Acid, Venous: 4.4 mmol/L (ref 0.5–1.9)

## 2020-11-11 LAB — PROTIME-INR
INR: 1.1 (ref 0.8–1.2)
Prothrombin Time: 13.9 seconds (ref 11.4–15.2)

## 2020-11-11 LAB — APTT: aPTT: 26 seconds (ref 24–36)

## 2020-11-11 SURGERY — CORONARY/GRAFT ACUTE MI REVASCULARIZATION

## 2020-11-11 MED ORDER — ONDANSETRON HCL 4 MG/2ML IJ SOLN: 4.0000 mg | Freq: Once | INTRAMUSCULAR | Status: AC

## 2020-11-11 MED ORDER — DOPAMINE-DEXTROSE 3.2-5 MG/ML-% IV SOLN: 5.0000 ug/kg/min | INTRAVENOUS | Status: DC

## 2020-11-11 MED ORDER — ONDANSETRON HCL 4 MG/2ML IJ SOLN
INTRAMUSCULAR | Status: AC
  Administered 2020-11-11: 4 mg via INTRAVENOUS
  Filled 2020-11-11: qty 2

## 2020-11-11 MED ORDER — DOPAMINE-DEXTROSE 3.2-5 MG/ML-% IV SOLN
INTRAVENOUS | Status: AC
  Administered 2020-11-11: 5 ug/kg/min via INTRAVENOUS
  Filled 2020-11-11: qty 250

## 2020-11-11 MED ORDER — ONDANSETRON HCL 4 MG/2ML IJ SOLN
4.0000 mg | Freq: Four times a day (QID) | INTRAMUSCULAR | Status: DC | PRN
  Administered 2020-11-11: 4 mg via INTRAVENOUS
  Filled 2020-11-11: qty 2

## 2020-11-11 MED ORDER — FENTANYL CITRATE (PF) 100 MCG/2ML IJ SOLN
50.0000 ug | Freq: Once | INTRAMUSCULAR | Status: AC
  Administered 2020-11-11: 50 ug via INTRAVENOUS

## 2020-11-11 MED ORDER — PROCHLORPERAZINE MALEATE 5 MG PO TABS: 5.0000 mg | ORAL_TABLET | Freq: Four times a day (QID) | ORAL | Status: DC | PRN

## 2020-11-11 MED ORDER — SODIUM CHLORIDE 0.9 % IV BOLUS
1000.0000 mL | Freq: Once | INTRAVENOUS | Status: AC
  Administered 2020-11-11: 1000 mL via INTRAVENOUS

## 2020-11-11 MED ORDER — LORAZEPAM 2 MG/ML IJ SOLN
0.5000 mg | INTRAMUSCULAR | Status: DC | PRN
  Administered 2020-11-11: 0.5 mg via INTRAVENOUS
  Filled 2020-11-11: qty 1

## 2020-11-11 MED ORDER — FENTANYL CITRATE (PF) 100 MCG/2ML IJ SOLN
INTRAMUSCULAR | Status: AC
  Filled 2020-11-11: qty 2

## 2020-11-11 MED ORDER — HYDROMORPHONE HCL 1 MG/ML IJ SOLN
0.5000 mg | INTRAMUSCULAR | Status: DC | PRN
  Administered 2020-11-11 (×2): 0.5 mg via INTRAVENOUS
  Filled 2020-11-11 (×2): qty 1

## 2020-11-11 MED ORDER — GLYCOPYRROLATE 1 MG PO TABS
1.0000 mg | ORAL_TABLET | ORAL | Status: DC | PRN
  Filled 2020-11-11: qty 1

## 2020-11-12 ENCOUNTER — Other Ambulatory Visit: Payer: Self-pay

## 2020-11-12 LAB — HEMOGLOBIN A1C
Hgb A1c MFr Bld: 5.2 % (ref 4.8–5.6)
Mean Plasma Glucose: 103 mg/dL

## 2020-11-12 LAB — PATHOLOGIST SMEAR REVIEW

## 2020-11-12 NOTE — Patient Outreach (Signed)
[place] [facility]) Care Management  11/12/2020  Tiffany Charles 11-22-1929 [id number]   Patient noted to be deceased.  RN CM will close case.  Jone Baseman, RN, MSN [place] Management Care Management Coordinator Direct Line [phone] Cell [phone] Toll Free: [phone]  Fax: [phone]

## 2020-11-23 NOTE — ED Notes (Signed)
Paced at 80.

## 2020-11-23 NOTE — ED Triage Notes (Signed)
BIB [facility] from home. CODE STEMI. 30 mins PTA pt started not feeling well. Pt had vomited multiple times. Pt has been alert. Pt brought in being paced at 60. Pt received 400NS, 324 aspirin, 6 morphine.   Pacing 60 170/70 100% 4L  18 L AC  18 R forearm

## 2020-11-23 NOTE — Significant Event (Signed)
Patient agonal breathing at AM assessment. Passed at 0706, pronounced by nursing team. Assessed patient at bedside and non responsive, no audible heart or lung sounds, pupils fixed and non reactive, central pulses non palpable, no response to pain.   TOD [phone]  Patient not donor, nursing discussed with decedent care and [facility].  Will attempt to contact rest of family. Don had asked I not notify him until 0900 so he could get some sleep since he was up throughout the morning.

## 2020-11-23 NOTE — Significant Event (Addendum)
Tiffany Charles arrived to the ED after EMS brought her from home for inferior STEMI.  Her caretaker, Tiffany Charles, had called EMS when Tiffany Charles had acute onset nausea, emesis, and a flushed feeling for which she started taking off some of her clothes.  On arrival to the ED she was being transcutaneously paced at heart rate 60 and was in noticeable pain although only alert and oriented to herself.  She has moderate dementia but still lives at home with a full-time caretaker.  Multiple attempts to contact family were made however only Tiffany Charles her caretaker was able to be reached.  She explained that Tiffany Charles's son is her only healthcare power of attorney and was at [facility] for a neurosurgical procedure.  She said that she had not been able to get in touch with him all day long which was abnormal.  We tried contacting all numbers on the contact list with no success.  Tiffany Charles had Charles ground emesis on transport.  She has significant history of GI bleed in April 2021 for which she received multiple units of blood products and for which EGD was deferred given her complex comorbidities.  Given her comorbidities, profuse Charles-ground emesis, significant prior GI bleed, dementia, and current functional status we will not proceed with invasive coronary evaluation.  Reviewed note from her last significant hospitalization on 09/01/2019 by Dr. Guerry Bruin who had spoke with Tiffany Charles and at that time Tiffany Charles was made DNAR with supportive measures if needed.  I contacted all available numbers in the chart without reaching anyone and spoke to Tiffany Charles at length about the family dynamics.  Apparently Tiffany Charles (Steve)'s son was appointed guardian last year when she was living with them however Tiffany Charles took over care of her over the past year and lives with her 24/7 and takes care of all of her needs. There is documentation regarding guardianship appointed to Tiffany Charles and restricting access to Tiffany Charles in the past. Tiffany Charles (care taker)  explained that this was changed over the past year to [facility] who is now her primary caretaker and who she lives with. The outreach and f/u notes would suggest this as well since all of the communication is with Tiffany Charles. I would ideally like family consensus regarding Tiffany Charles's care however this is not currently possible and given her very acute instability we will pursue comfort measures as below.   I was able to get in touch with Tiffany Charles who is currently hospitalized at [facility] for surgery (room contact #: [phone]). We discussed the unfortunate events tonight with his mom and he agreed that comfort care seemed the most reasonable option going forward.  I offered for her to as well go to the ICU for more invasive monitoring and treatment however I did explain that with invasive coronary assessment not currently being offered secondary to her instability and complex comorbidities there is not a great fix for her issue other than medications many of which we cannot give secondary to her hypotension, bradycardia, and possible GI bleed.  I attempted to contact Tiffany Charles and Tiffany Charles) multiple different ways however was unable to reach anybody.  I do think that it is reasonable to proceed with transition to comfort care given the lack of options we have for treatment of Tiffany Charles. Aggressive evaluation/workup has not been pursued in the past following her last significant hospitalization 2/2 comorbidities. I will continue to try to reach out to family.   Contact information:  Tiffany Charles ([phone]) Tiffany Charles) Tiffany Charles,  Tiffany Charles's father, [phone]) Tiffany Charles (care taker), [phone], [phone])
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
## 2020-11-23 NOTE — ED Provider Notes (Signed)
[facility] EMERGENCY DEPARTMENT Provider Note  CSN: [id number] Arrival date & time: December 09, 2020 0005  Chief Complaint(s) No chief complaint on file.  HPI Tiffany Charles is a 87 y.o. female with a past medical history listed below including Alzheimer's dementia here as a code STEMI found to be bradycardic down to the 30s and hypotensive requiring external pacing. EMS by the patient's caregiver was called out due to emesis at home. Apparently patient's son is at an outside hospital and recovery after brain surgery.  Remainder of history, ROS, and physical exam limited due to patient's condition (dementia). Additional information was obtained from EMS and records.   Level V Caveat.   HPI  Past Medical History Past Medical History:  Diagnosis Date   Angina pectoris    Arthritis    Chronic anticoagulation    Xarelto   Chronic diastolic (congestive) heart failure (HCC)    COPD (chronic obstructive pulmonary disease) ([place])    Coronary artery disease    a. s/p RCA stent 2008. b. cath 02/2009: 30% mLAD, 90% AV groove cx unchanged from prior, 50-60% RCA, EF 60% -> med rx.   Depression    Essential hypertension    Hyperlipidemia    Mild pulmonary hypertension ([place]) 09/17/2016   Permanent atrial fibrillation ([place])    Skin cancer Jan 2013   squamous cell- removed   Tricuspid regurgitation 09/17/2016   Patient Active Problem List   Diagnosis Date Noted   STEMI (ST elevation myocardial infarction) ([place]) 12/09/2020   Proximal humerus fracture 10/18/2019   NSTEMI (non-ST elevated myocardial infarction) ([place]) 09/22/2019   Ischemic cardiomyopathy 09/22/2019   PVD (peripheral vascular disease) ([place]) 09/22/2019   Stage I decubitus ulcer and pressure area 09/22/2019   Protein-calorie malnutrition, severe 09/16/2019   Palliative care by specialist    Hemorrhagic shock ([place]) 09/01/2019   DNR (do not resuscitate) 06/08/2019   Occipital stroke ([place]) 06/07/2019   Squamous cell  carcinoma, leg, unspecified laterality 06/08/2018   Basal cell carcinoma (BCC) of nasal tip 06/08/2018   Senile dementia without behavioral disturbance ([place]) 04/29/2017   Frequent PVCs 09/17/2016   Tricuspid regurgitation 09/17/2016   Mild pulmonary hypertension ([place]) 09/17/2016   Chronic diastolic (congestive) heart failure ([place])    Essential hypertension    Generalized OA 12/28/2014   Goals of care, counseling/discussion 11/09/2013   Depression, major ([place]) 11/07/2013   Permanent atrial fibrillation 10/28/2012   COPD - PFTs in June 2014 with MET test 10/28/2012   CAD, RCA BMS '09, 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 low risk Myoview Feb 2013 10/28/2012   Home Medication(s) Prior to Admission medications   Medication Sig Start Date End Date Taking? Authorizing Provider  acetaminophen (TYLENOL) 325 MG tablet Take 2 tablets (650 mg total) by mouth every 4 (four) hours as needed for fever. 09/27/19   Allie Bossier, MD  acitretin (SORIATANE) 25 MG capsule Take 25 mg by mouth daily. 06/30/19   [provider]  ascorbic acid (VITAMIN C) 500 MG tablet Take 500 mg by mouth daily.    [provider]  aspirin EC 81 MG tablet Take 81 mg by mouth daily.    [provider]  atorvastatin (LIPITOR) 20 MG tablet Take 1 tablet (20 mg total) by mouth daily at 6 PM. 09/27/19   Allie Bossier, MD  cholecalciferol (VITAMIN D3) 25 MCG (1000 UNIT) tablet Take 1,000 Units by mouth daily.    [provider]  folic acid (FOLVITE) 1 MG  tablet Take 1 tablet (1 mg total) by mouth daily. 09/27/19   Allie Bossier, MD  HYDROcodone-acetaminophen (NORCO/VICODIN) 5-325 MG tablet Take 1 tablet by mouth every 6 (six) hours as needed for moderate pain or severe pain. 10/18/19   Marybelle Killings, MD  lactulose (CHRONULAC) 10 GM/15ML solution Take 15 mLs (10 g total) by mouth 2 (two) times daily as needed for mild constipation. 09/27/19   Allie Bossier, MD  LORazepam (ATIVAN) 1 MG tablet Take  1 tablet (1 mg total) by mouth every 6 (six) hours as needed for anxiety. 09/27/19   Allie Bossier, MD  metoprolol tartrate (LOPRESSOR) 50 MG tablet Take 1 tablet (50 mg total) by mouth 2 (two) times daily. 09/27/19   Allie Bossier, MD  pantoprazole (PROTONIX) 40 MG tablet Take 1 tablet (40 mg total) by mouth 2 (two) times daily. 09/27/19   Allie Bossier, MD  polyethylene glycol (MIRALAX / GLYCOLAX) 17 g packet Take 17 g by mouth daily as needed for moderate constipation. 09/27/19   Allie Bossier, MD  tamsulosin (FLOMAX) 0.4 MG CAPS capsule Take 1 capsule (0.4 mg total) by mouth daily. 09/27/19   Allie Bossier, MD  torsemide (DEMADEX) 20 MG tablet Take 1 tablet (20 mg total) by mouth daily. 10/13/19   [name], MD  traMADol (ULTRAM) 50 MG tablet Take 1 tablet (50 mg total) by mouth every 6 (six) hours as needed. 11/23/19   Marybelle Killings, MD  vitamin B-12 (CYANOCOBALAMIN) 1000 MCG tablet Take 1,000 mcg by mouth daily.    [provider]                                                                                                                                    Past Surgical History Past Surgical History:  Procedure Laterality Date    CATARACTS REMOVED     ABDOMINAL HYSTERECTOMY     CARDIAC CATHETERIZATION  03/22/2009   Started medical therapy   CARDIOVERSION  06/05/2009   3 attempts, last attempt successful   CORONARY ANGIOPLASTY WITH STENT PLACEMENT  2009   RCA Vision stent   HIP SURGERY     LOWER EXTREMITY ANGIOGRAPHY Left 08/10/2019   Procedure: Lower Extremity Angiography;  Surgeon: Katha Cabal, MD;  Location: [facility];  Service: Cardiovascular;  Laterality: Left;   MELANOMA EXCISION  06/06/2011   Procedure: MELANOMA EXCISION;  Surgeon: Adin Hector, MD;  Location: [facility];  Service: General;  Laterality: Left;  re excision squamous cell lesion left chest wall    Family History Family History  Problem Relation Age of Onset   Stroke Mother     Heart disease Mother    Coronary artery disease Father    Heart disease Father    Stroke Sister    Heart disease Brother    Stroke Sister    Stroke Sister  Social History Social History   Tobacco Use   Smoking status: Former    Packs/day: 0.50    Pack years: 0.00    Types: Cigarettes   Smokeless tobacco: Never  Vaping Use   Vaping Use: Never used  Substance Use Topics   Alcohol use: No   Drug use: No   Allergies Codeine  Review of Systems Review of Systems Unable to obtain due to dementia Physical Exam Vital Signs  I have reviewed the triage vital signs BP (!) 83/51   Pulse (!) 32   Temp 97.7 F (36.5 C)   Resp 15   Ht 5' 6"  (1.676 m)   Wt 55 kg   SpO2 95%   BMI 19.57 kg/m   Physical Exam Vitals reviewed.  Constitutional:      General: She is not in acute distress.    Appearance: She is well-developed. She is not diaphoretic.     Comments: Coffee ground emesis on clothing  HENT:     Head: Normocephalic and atraumatic.     Nose: Nose normal.  Eyes:     General: No scleral icterus.       Right eye: No discharge.        Left eye: No discharge.     Conjunctiva/sclera: Conjunctivae normal.     Pupils: Pupils are equal, round, and reactive to light.  Cardiovascular:     Rate and Rhythm: Normal rate and regular rhythm.     Heart sounds: No murmur heard.   No friction rub. No gallop.     Comments: External pacing at rate of 60. Underlying rate in 30s, with atrial fibrilation Pulmonary:     Effort: Pulmonary effort is normal. No respiratory distress.     Breath sounds: Normal breath sounds. No stridor. No rales.  Abdominal:     General: There is no distension.     Palpations: Abdomen is soft.     Tenderness: There is no abdominal tenderness.  Musculoskeletal:        General: No tenderness.     Cervical back: Normal range of motion and neck supple.  Skin:    General: Skin is warm and dry.     Findings: No erythema or rash.  Neurological:      Mental Status: She is alert. She is disoriented.     Comments: Oriented to self only    ED Results and Treatments Labs (all labs ordered are listed, but only abnormal results are displayed) Labs Reviewed  CBC WITH DIFFERENTIAL/PLATELET - Abnormal; Notable for the following components:      Result Value   WBC 18.1 (*)    Neutro Abs 8.0 (*)    Lymphs Abs 8.2 (*)    Monocytes Absolute 1.3 (*)    Abs Immature Granulocytes 0.08 (*)    All other components within normal limits  COMPREHENSIVE METABOLIC PANEL - Abnormal; Notable for the following components:   Potassium 3.2 (*)    Glucose, Bld 180 (*)    Creatinine, Ser 1.16 (*)    Calcium 8.8 (*)    Total Protein 6.3 (*)    GFR, Estimated 45 (*)    All other components within normal limits  LIPID PANEL - Abnormal; Notable for the following components:   LDL Cholesterol 112 (*)    All other components within normal limits  LACTIC ACID, PLASMA - Abnormal; Notable for the following components:   Lactic Acid, Venous 4.4 (*)    All other components within normal limits  I-STAT CHEM 8, ED - Abnormal; Notable for the following components:   Sodium 146 (*)    Potassium 3.2 (*)    Glucose, Bld 178 (*)    Calcium, Ion 1.11 (*)    All other components within normal limits  TROPONIN I (HIGH SENSITIVITY) - Abnormal; Notable for the following components:   Troponin I (High Sensitivity) 106 (*)    All other components within normal limits  RESP PANEL BY RT-PCR (FLU A&B, COVID) ARPGX2  PROTIME-INR  APTT  HEMOGLOBIN A1C  PATHOLOGIST SMEAR REVIEW                                                                                                                         EKG  EKG Interpretation  Date/Time:    Ventricular Rate:    PR Interval:    QRS Duration:   QT Interval:    QTC Calculation:   R Axis:     Text Interpretation:          Radiology DG Chest Portable 1 View  Result Date: 11/18/20 CLINICAL DATA:  Vomiting.  Unwell  feeling.  Code STEMI EXAM: PORTABLE CHEST 1 VIEW COMPARISON:  10/11/2019 FINDINGS: Patient rotation limits examination. There is evidence of cardiac enlargement. Lungs are clear. Mediastinal contours appear grossly intact. No pleural effusions. No pneumothorax. Visualized ribs are nondepressed. Deformity of the right proximal humerus is likely old fracture deformity. IMPRESSION: Cardiac enlargement.  No evidence of active pulmonary disease. Electronically Signed   By: Lucienne Capers M.D.   On: 11/18/2020 00:55    Pertinent labs & imaging results that were available during my care of the patient were reviewed by me and considered in my medical decision making (see chart for details).  Medications Ordered in ED Medications  ondansetron (ZOFRAN) injection 4 mg (has no administration in time range)  LORazepam (ATIVAN) injection 0.5 mg (has no administration in time range)  HYDROmorphone (DILAUDID) injection 0.5 mg (has no administration in time range)  prochlorperazine (COMPAZINE) tablet 5 mg (has no administration in time range)  glycopyrrolate (ROBINUL) tablet 1 mg (has no administration in time range)  fentaNYL (SUBLIMAZE) injection 50 mcg ( Intravenous Canceled Entry 11-18-20 0024)  sodium chloride 0.9 % bolus 1,000 mL (0 mLs Intravenous Stopped 2020/11/18 0146)  ondansetron (ZOFRAN) injection 4 mg (4 mg Intravenous Given 18-Nov-2020 0119)  Procedures .1-3 Lead EKG Interpretation  Date/Time: 2020/12/07 4:09 AM Performed by: Fatima Blank, MD Authorized by: Fatima Blank, MD     Interpretation: abnormal     ECG rate:  46   ECG rate assessment: bradycardic     Rhythm: atrial fibrillation     Ectopy: none     Conduction: normal   .Critical Care  Date/Time: 2020-12-07 4:09 AM Performed by: Fatima Blank, MD Authorized by: Fatima Blank, MD   Critical care provider statement:    Critical care time (minutes):  45   Critical care was necessary to treat or prevent imminent or life-threatening deterioration of the following conditions:  Cardiac failure   Critical care was time spent personally by me on the following activities:  Discussions with consultants, evaluation of patient's response to treatment, examination of patient, ordering and performing treatments and interventions, ordering and review of laboratory studies, ordering and review of radiographic studies, pulse oximetry, re-evaluation of patient's condition, obtaining history from patient or surrogate and review of old charts   Care discussed with: admitting provider    (including critical care time)  Medical Decision Making / ED Course I have reviewed the nursing notes for this encounter and the patient's prior records (if available in EHR or on provided paperwork).   DONNIELLE ADDISON was evaluated in Emergency Department on 12-07-20 for the symptoms described in the history of present illness. She was evaluated in the context of the global COVID-19 pandemic, which necessitated consideration that the patient might be at risk for infection with the SARS-CoV-2 virus that causes COVID-19. Institutional protocols and algorithms that pertain to the evaluation of patients at risk for COVID-19 are in a state of rapid change based on information released by regulatory bodies including the CDC and federal and state organizations. These policies and algorithms were followed during the patient's care in the ED.  Patient is being externally paced. No family to provide history in the setting of dementia. Review of records we noted patient was recently admitted in May 2021 and at that time was deemed to be DNR/DNI by the son.  Cardiology at bedside determined that patient would not be a good Candidate.  Given the patient's wishes for DNR, external pacing was discontinued.   Patient was able to maintain a rate in the 40s to 60s on her own. EKG was noted to have have ST segment elevation in inferior leads.  Likely RCA distribution contributing to her bradycardia.  IV fluids were given.  Medical management was pursued. Patient will be admitted to cardiac ICU for further management.      Final Clinical Impression(s) / ED Diagnoses Final diagnoses:  ST elevation myocardial infarction involving right coronary artery [facility])      This chart was dictated using voice recognition software.  Despite best efforts to proofread,  errors can occur which can change the documentation meaning.    Fatima Blank, MD 2020/12/07 [phone]

## 2020-11-23 NOTE — ED Notes (Signed)
Pacing at 86

## 2020-11-23 NOTE — ED Notes (Signed)
MD Renella Cunas notified about cardiac time of death. TOD 0706, patient pronounced by Phill Myron, RN and verified with Leandra Kern, RN.

## 2020-11-23 NOTE — ED Notes (Signed)
Decision made by Dannette Barbara to make patient full comfort care per MD [facility]. Dopamine gtt turned off. Patient denies any pain or complaints at this time.

## 2020-11-23 DEATH — deceased

## 2020-11-30 ENCOUNTER — Telehealth: Payer: Self-pay | Admitting: Cardiovascular Disease

## 2020-11-30 NOTE — Telephone Encounter (Signed)
Will forward to dr croitoru for the okay.

## 2020-11-30 NOTE — Telephone Encounter (Signed)
Spoke with [employer], they will get the certificate uploaded to the [employer]ave system.

## 2020-11-30 NOTE — Telephone Encounter (Signed)
Tiffany Charles is calling requesting Dr. Sallyanne Kuster sign the death certificate for this patient. She states it is online on [employer]. The family advised her Cardiologist was Dr. Gwenlyn Found, but I do not see on the chart that the patient was ever seen by him. Please advise.

## 2020-12-03 ENCOUNTER — Ambulatory Visit: Payer: Self-pay
